# Patient Record
Sex: Female | Born: 1955
Health system: Southern US, Community
[De-identification: ages and names within clinical notes are randomized; demographics above are authoritative.]

## PROBLEM LIST (undated history)

## (undated) DIAGNOSIS — F329 Major depressive disorder, single episode, unspecified: Secondary | ICD-10-CM

## (undated) DIAGNOSIS — M1990 Unspecified osteoarthritis, unspecified site: Secondary | ICD-10-CM

## (undated) DIAGNOSIS — F32A Depression, unspecified: Secondary | ICD-10-CM

## (undated) DIAGNOSIS — M797 Fibromyalgia: Secondary | ICD-10-CM

## (undated) DIAGNOSIS — I82409 Acute embolism and thrombosis of unspecified deep veins of unspecified lower extremity: Secondary | ICD-10-CM

## (undated) DIAGNOSIS — I4891 Unspecified atrial fibrillation: Secondary | ICD-10-CM

## (undated) DIAGNOSIS — F419 Anxiety disorder, unspecified: Secondary | ICD-10-CM

## (undated) DIAGNOSIS — G4730 Sleep apnea, unspecified: Secondary | ICD-10-CM

## (undated) DIAGNOSIS — E669 Obesity, unspecified: Secondary | ICD-10-CM

## (undated) DIAGNOSIS — R001 Bradycardia, unspecified: Secondary | ICD-10-CM

## (undated) HISTORY — DX: Bradycardia, unspecified: R00.1

## (undated) HISTORY — DX: Sleep apnea, unspecified: G47.30

## (undated) HISTORY — PX: HERNIA REPAIR: SHX51

## (undated) HISTORY — PX: KNEE SURGERY: SHX244

## (undated) HISTORY — DX: Unspecified osteoarthritis, unspecified site: M19.90

## (undated) HISTORY — DX: Fibromyalgia: M79.7

## (undated) HISTORY — DX: Major depressive disorder, single episode, unspecified: F32.9

## (undated) HISTORY — DX: Unspecified atrial fibrillation: I48.91

## (undated) HISTORY — DX: Obesity, unspecified: E66.9

## (undated) HISTORY — DX: Depression, unspecified: F32.A

## (undated) HISTORY — PX: OTHER SURGICAL HISTORY: SHX169

## (undated) HISTORY — DX: Anxiety disorder, unspecified: F41.9

## (undated) HISTORY — PX: CHOLECYSTECTOMY: SHX55

## (undated) HISTORY — DX: Acute embolism and thrombosis of unspecified deep veins of unspecified lower extremity: I82.409

---

## 1998-03-04 ENCOUNTER — Ambulatory Visit (HOSPITAL_COMMUNITY): Admission: RE | Admit: 1998-03-04 | Discharge: 1998-03-04 | Payer: Self-pay | Admitting: Specialist

## 1998-03-04 ENCOUNTER — Encounter: Payer: Self-pay | Admitting: Specialist

## 1999-11-11 ENCOUNTER — Other Ambulatory Visit: Admission: RE | Admit: 1999-11-11 | Discharge: 1999-11-11 | Payer: Self-pay | Admitting: Gynecology

## 2000-01-23 ENCOUNTER — Encounter (INDEPENDENT_AMBULATORY_CARE_PROVIDER_SITE_OTHER): Payer: Self-pay | Admitting: Specialist

## 2000-01-23 ENCOUNTER — Other Ambulatory Visit: Admission: RE | Admit: 2000-01-23 | Discharge: 2000-01-23 | Payer: Self-pay | Admitting: Gynecology

## 2000-04-18 ENCOUNTER — Ambulatory Visit: Admission: RE | Admit: 2000-04-18 | Discharge: 2000-04-18 | Payer: Self-pay | Admitting: Specialist

## 2000-08-15 ENCOUNTER — Encounter (INDEPENDENT_AMBULATORY_CARE_PROVIDER_SITE_OTHER): Payer: Self-pay | Admitting: Specialist

## 2000-08-15 ENCOUNTER — Ambulatory Visit (HOSPITAL_COMMUNITY): Admission: RE | Admit: 2000-08-15 | Discharge: 2000-08-15 | Payer: Self-pay | Admitting: Gastroenterology

## 2000-09-18 ENCOUNTER — Other Ambulatory Visit: Admission: RE | Admit: 2000-09-18 | Discharge: 2000-09-18 | Payer: Self-pay | Admitting: Gynecology

## 2000-12-26 ENCOUNTER — Encounter (INDEPENDENT_AMBULATORY_CARE_PROVIDER_SITE_OTHER): Payer: Self-pay

## 2000-12-26 ENCOUNTER — Other Ambulatory Visit: Admission: RE | Admit: 2000-12-26 | Discharge: 2000-12-26 | Payer: Self-pay | Admitting: Gynecology

## 2002-07-04 ENCOUNTER — Other Ambulatory Visit: Admission: RE | Admit: 2002-07-04 | Discharge: 2002-07-04 | Payer: Self-pay | Admitting: Gynecology

## 2002-08-16 ENCOUNTER — Ambulatory Visit (HOSPITAL_BASED_OUTPATIENT_CLINIC_OR_DEPARTMENT_OTHER): Admission: RE | Admit: 2002-08-16 | Discharge: 2002-08-16 | Payer: Self-pay | Admitting: Otolaryngology

## 2005-02-24 ENCOUNTER — Other Ambulatory Visit: Admission: RE | Admit: 2005-02-24 | Discharge: 2005-02-24 | Payer: Self-pay | Admitting: Gynecology

## 2006-01-11 ENCOUNTER — Ambulatory Visit (HOSPITAL_COMMUNITY): Admission: RE | Admit: 2006-01-11 | Discharge: 2006-01-12 | Payer: Self-pay | Admitting: Orthopedic Surgery

## 2010-10-28 NOTE — Procedures (Signed)
[facility]  Patient:    Angela Cummings, Angela Cummings                      MRN: [id number] Proc. Date: 08/15/00 Attending:  Florencia Reasons, M.D. CC:         Aura Dials, M.D., [facility]   Procedure Report  PROCEDURE:  Upper endoscopy with biopsy.  ENDOSCOPIST:  Florencia Reasons, M.D.  INDICATIONS:  A 55 year old with iron-deficiency anemia, 12 years status post some sort of gastric surgery for obesity.  FINDINGS:  Small gastric remnant anastomosed to small intestine.  DESCRIPTION OF PROCEDURE:  The nature, purpose, and risks of the procedure have been reviewed with the patient who provided written consent.  Sedation was fentanyl 87.5 mcg and Versed 7 mg IV without arrhythmias or desaturation.  The Olympus adult video endoscope was passed under direct vision, entering the esophagus without difficulty.  The vocal cords were not well seen.  The esophagus was endoscopically normal without evidence of reflux esophagitis, Barretts esophagus, varices, infection, or neoplasia.  No ring, stricture, or hiatal hernia was appreciated.  The gastric remnant was fairly small, probably about 5 to 10 cm in length, and at its distal end was a nice, widely patent anastomosis to the small bowel in a Billroth I sort of configuration.  I was able to advance the scope a long distance down the small bowel without difficulty, and it had a normal endoscopic appearance.  Several small-bowel biopsies were obtained to help rule out sprue as the source of the iron-deficiency anemia, and the scope was then removed from the patient who tolerated the procedure well and without apparent complication.  Retroflexion was not performed in the gastric pouch.  IMPRESSION:  Normal exam status post gastric surgery.  PLAN:  Await pathology on small-bowel biopsies. DD:  08/15/00 TD:  08/15/00 Job: [id number]

## 2010-10-28 NOTE — Op Note (Signed)
NAME:  Angela Cummings, Angela Cummings             ACCOUNT NO.:  [id number]   MEDICAL RECORD NO.:  [id number]          PATIENT TYPE:  AMB   LOCATION:  DAY                          FACILITY:  [facility]   PHYSICIAN:  Marlowe Kays, M.D.  DATE OF BIRTH:  11/03/1955   DATE OF PROCEDURE:  01/11/2006  DATE OF DISCHARGE:                                 OPERATIVE REPORT   PREOPERATIVE DIAGNOSES:  1.  Adhesive capsulitis.  2.  Chronic impingement syndrome with partial rotator cuff tear, left      shoulder.   POSTOPERATIVE DIAGNOSES:  1.  Adhesive capsulitis.  2.  Chronic impingement syndrome with partial rotator cuff tear, left      shoulder.   OPERATIONS:  1.  Closed manipulation, left shoulder.  2.  Left shoulder arthroscopy (normal examination and arthroscopic      subacromial and distal inferior clavicle decompression.   SURGEON:  Dr. Simonne Come.   ASSISTANT:  Mr. Idolina Primer, [place].   ANESTHESIA:  General.   PATHOLOGY AND JUSTIFICATION FOR PROCEDURE:  Problem stem from a fall in  April, and she injured her foot and her left shoulder.  Because of  persistent pain and loss of motion, had a MRI performed on the left  shoulder, on November 12, 2005, which demonstrated rotator cuff tendinopathy with  no full-thickness tear.  It was felt that, because of the combination of  fairly restricted range of motion and the rotator cuff tendinopathy, that  the above-mentioned surgery was indicated.   PROCEDURE:  Satisfactory general anesthesia, beach-chair position on the  Murphy frame.  I first gently manipulated her left shoulder, with abduction,  extension, external rotation and internal rotation and repeating the cycle  several times.  Audible and palpable lysis of adhesions could be felt,  particularly the abduction and extension, with full restoration of normal  motion at the conclusion of this procedure.  We then prepped her left  shoulder and arm in the usual fashion and draped her in sterile field.  The  anatomy of the shoulder joint was marked out, and I injected the subacromial  space, and lateral and posterior portals with 0.5% Marcaine with adrenaline.  Through a posterior soft spot portal, I atraumatically entered the humeral  joint.  There was some minor bleeding which presumably was as a result of  the closed manipulation, but no abnormality was noted, and some  representative pictures were taken.  I then redirected the scope in the  subacromial space and, through a lateral portal, introduced a blunt cannula,  followed by a 4.2 shaver.  She had fairly exuberant subdeltoid bursitis  which I cleared up with the 4.2 shaver.  I documented the several areas  impingement, including anterior leading edge of the acromion and the distal  clavicle.  I then used the ArthroCare vaporizer to begin removing bone from  the under the surface of these areas, and followed this with the 4-mm oval  bur, and then went back and forth between the three instruments until we had  a wide decompression involving the anterior acromion and the distal  clavicle.  This was documented with  pictures with her arm to her side and  the arm abducted.  All bleeding was coagulated.  At the conclusion of the  case, she was given 30 mg of Toradol IV.  All fluid possible was removed.  I  reinjected the two portals and the subacromial space with 0.5% Marcaine with  adrenalin and then closed the two portals with 4-0 nylon, followed by  Betadine, Adaptic, dry sterile dressing and shoulder immobilizer.  She  tolerated the procedure and was taken to the recovery room in satisfactory  condition with no complication.           ______________________________  Marlowe Kays, M.D.     JA/MEDQ  D:  01/11/2006  T:  01/11/2006  Job:  [id number]

## 2010-10-28 NOTE — Procedures (Signed)
[facility]  Patient:    FLOREINE, KINGDON                      MRN: [id number] Proc. Date: 08/15/00 Attending:  Florencia Reasons, M.D. CC:         Aura Dials, M.D.                           Procedure Report  PROCEDURE:  Colonoscopy with biopsies.  INDICATIONS:  55 year old with unexplained iron deficiency anemia.  INTRAOPERATIVE FINDINGS:  Grossly normal exam to the cecal region. Questionable small cecal polyp, not biopsied.  ANESTHESIA:  Fentanyl 100 mcg and Versed 10 mg IV.  DESCRIPTION OF PROCEDURE:  The nature, purpose and risks of the procedure have been discussed with the patient who provided written consent. This procedure was performed immediately following an upper endoscopy with total sedation for the two procedures being fentanyl 100 mcg and Versed 10 mg IV without arrhythmias or desaturation.  The Olympus adult video colonoscope was advanced to the level just above the ileocecal valve, but despite abdominal compression and having the patient in the left lateral supine, right lateral and prone positions, we were unable to reach the base of the cecum, despite insertion of virtually the entire scope and trials of external abdominal compression. However, I was able to look down into the cecum and see virtually the entire cecal surface area and did not see anything abnormal other than a question of a small sessile polyp which I attempted to biopsy but I could not get the biopsy forceps oriented properly to reach the lesion. Thus, this possible small 3-4 mm polyp was never biopsied. Pullback was then performed. The quality of the prep was very good and it was felt that essentially all areas were well seen. No additional polyps were identified and there was no evidence of cancer, colitis, vascular malformations or diverticular disease. In retroflexion, the rectum was unremarkable. Random mucosal biopsies were obtained along the  length of the colon because of question of some diarrhea in this patient based on talking with her at the start of the procedure, although, she did not complain of diarrhea when I saw her in the office to set up the procedure. The patient tolerated the procedure well and there were no apparent complications.  IMPRESSION:  Grossly normal exam to the cecal region with questionable small cecal polyp, not biopsied. See above discussion. No source of iron deficiency anemia endoscopically evident.  PLAN:  Await pathology on random mucosal biopsies. DD:  08/15/00 TD:  08/15/00 Job: [id number]

## 2010-11-11 DIAGNOSIS — I4891 Unspecified atrial fibrillation: Secondary | ICD-10-CM

## 2010-11-11 HISTORY — DX: Unspecified atrial fibrillation: I48.91

## 2010-11-12 ENCOUNTER — Emergency Department (HOSPITAL_COMMUNITY): Payer: 59

## 2010-11-12 ENCOUNTER — Observation Stay (HOSPITAL_COMMUNITY)
Admission: EM | Admit: 2010-11-12 | Discharge: 2010-11-13 | Disposition: A | Discharge status: Home / Self Care | Payer: 59 | Attending: Family Medicine | Admitting: Family Medicine

## 2010-11-12 DIAGNOSIS — R000 Tachycardia, unspecified: Secondary | ICD-10-CM

## 2010-11-12 DIAGNOSIS — R079 Chest pain, unspecified: Secondary | ICD-10-CM | POA: Insufficient documentation

## 2010-11-12 DIAGNOSIS — I4891 Unspecified atrial fibrillation: Principal | ICD-10-CM | POA: Insufficient documentation

## 2010-11-12 DIAGNOSIS — F411 Generalized anxiety disorder: Secondary | ICD-10-CM | POA: Insufficient documentation

## 2010-11-12 DIAGNOSIS — R42 Dizziness and giddiness: Secondary | ICD-10-CM | POA: Insufficient documentation

## 2010-11-12 DIAGNOSIS — R61 Generalized hyperhidrosis: Secondary | ICD-10-CM | POA: Insufficient documentation

## 2010-11-12 DIAGNOSIS — M1990 Unspecified osteoarthritis, unspecified site: Secondary | ICD-10-CM | POA: Insufficient documentation

## 2010-11-12 DIAGNOSIS — M479 Spondylosis, unspecified: Secondary | ICD-10-CM

## 2010-11-12 LAB — CBC
HCT: 36.6 % (ref 36.0–46.0)
Hemoglobin: 11.8 g/dL — ABNORMAL LOW (ref 12.0–15.0)
MCH: 28.4 pg (ref 26.0–34.0)
MCHC: 32.2 g/dL (ref 30.0–36.0)
MCV: 88.2 fL (ref 78.0–100.0)
Platelets: 227 10*3/uL (ref 150–400)
RBC: 4.15 MIL/uL (ref 3.87–5.11)
RDW: 14.8 % (ref 11.5–15.5)
WBC: 7.8 10*3/uL (ref 4.0–10.5)

## 2010-11-13 LAB — CARDIAC PANEL(CRET KIN+CKTOT+MB+TROPI)
Relative Index: INVALID (ref 0.0–2.5)
Relative Index: INVALID (ref 0.0–2.5)
Total CK: 96 U/L (ref 7–177)
Troponin I: <0.3 ng/mL (ref <0.30)

## 2010-11-13 LAB — COMPREHENSIVE METABOLIC PANEL
Albumin: 3.2 g/dL — ABNORMAL LOW (ref 3.5–5.2)
BUN: 10 mg/dL (ref 6–23)
Chloride: 108 mEq/L (ref 96–112)
Creatinine, Ser: 0.78 mg/dL (ref 0.4–1.2)
Glucose, Bld: 111 mg/dL — ABNORMAL HIGH (ref 70–99)
Total Bilirubin: 0.3 mg/dL (ref 0.3–1.2)
Total Protein: 6.5 g/dL (ref 6.0–8.3)

## 2010-11-13 LAB — CK TOTAL AND CKMB (NOT AT ARMC)
Relative Index: 2.6 — ABNORMAL HIGH (ref 0.0–2.5)
Total CK: 113 U/L (ref 7–177)

## 2010-11-13 LAB — GLUCOSE, CAPILLARY: Glucose-Capillary: 87 mg/dL (ref 70–99)

## 2010-11-13 LAB — TROPONIN I: Troponin I: <0.3 ng/mL (ref <0.30)

## 2010-11-22 NOTE — H&P (Signed)
NAME:  Angela Cummings, Angela Cummings NO.:  [id number]  MEDICAL RECORD NO.:  [id number]           PATIENT TYPE:  O  LOCATION:  2008                         FACILITY:  [facility]  PHYSICIAN:  Houston Siren, MD           DATE OF BIRTH:  1955/08/07  DATE OF ADMISSION:  11/12/2010 DATE OF DISCHARGE:                             HISTORY & PHYSICAL   PRIMARY CARE PHYSICIAN:  None.  REASON FOR ADMISSION:  Atrial fibrillation with rapid ventricular rate and chest pain.  ADVANCE DIRECTIVES:  Full code.  HISTORY OF PRESENT ILLNESS:  This is a 55 year old female with benign past medical history on no medication, but perhaps by virtue of never seeing a physician, presented by EMS with chest pain.  She was found to be in atrial fibrillation with rapid ventricular rate.  She converted spontaneously in the emergency room, and her chest pain resolved completely.  She denied excessive alcohol use and no recent over-the- counter decongestants.  She has intermittently felt this way in the past described as chest tightness.  Evaluation shows EMS strip with atrial fibrillation at a rate approximately 200 bpm, now her heart rate is around 50.  Her chest x-ray showed cardiomegaly, but otherwise unremarkable, and her potassium is 3.8.  She has a normal white count of 7800, hemoglobin of 11.8.  It was noted that she was given Lopressor 25 mg and subsequently, her heart rate is between 45 and 50, asymptomatic. Hospitalist was asked to admit the patient because of these symptoms.  PAST MEDICAL HISTORY:  Benign.  MEDICATIONS:  On no chronic meds.  ALLERGIES:  No known drug allergies.  SOCIAL HISTORY:  She is married with three children.  She lives in a residential home .  Denies tobacco or drug use.  She drinks alcohol rarely.  FAMILY HISTORY:  No family history of diabetes or thyroid problems.  REVIEW OF SYSTEMS:  Otherwise, unremarkable.  PHYSICAL EXAMINATION:  VITAL SIGNS:  Blood pressure 106/66,  pulse of 50, respiratory rate of 18, temperature 98.1. GENERAL:  She is alert, oriented and is in no apparent distress.  Her speech is fluent.  Tongue is midline.  Uvula elevated with phonation. NECK:  Supple.  There is no thyromegaly.  No thyroid bruit.  She has no JVD. EYES:  Sclera nonicteric. CARDIAC:  S1-S2 regular.  I did not hear any murmur, rub or gallop. LUNGS:  Clear.  No wheezes, rales or any evidence of consolidation. ABDOMEN:  Obese, nondistended, nontender. EXTREMITIES:  No edema.  No calf tenderness. SKIN:  Warm and dry. NEUROLOGIC:  Unremarkable. PSYCHIATRIC:  Unremarkable.  OBJECTIVE FINDINGS:  EKG prior atrial fibrillation with rapid ventricular rate.  Last EKG showed normal sinus rhythm without any acute ST-T changes.  Serum sodium 142, potassium 3.8, glucose 111.  Chest x- ray showed cardiomegaly but without any acute process.  Liver function tests are normal.  Troponin less than 0.30.  White count is 7800, hemoglobin 11.8, platelet count of 227,000.  IMPRESSION:  This is a 55 year old with paroxysmal atrial fibrillation CHADS score of 0.  (Though it should be noted that she does  not get regular medical care) now in normal sinus rhythm.  She has no excessive alcohol use.  We will admit.  We will check echo and thyroid function test.  I noted that she was given one dose of Lopressor, and now her heart rate is in the high 40s, and we will hold further beta blocker for negative chronotrope.  We will give her an aspirin and rule out with serial CPKs and troponins.  There is no evidence of pulmonary embolism. We will admit her to [facility].  She is stable and is a full code.     Houston Siren, MD     PL/MEDQ  D:  11/13/2010  T:  11/13/2010  Job:  [id number]  Electronically Signed by Houston Siren  on 11/22/2010 02:06:51 AM

## 2010-11-25 ENCOUNTER — Encounter: Payer: Self-pay | Admitting: Internal Medicine

## 2010-11-28 ENCOUNTER — Encounter: Payer: Self-pay | Admitting: Internal Medicine

## 2010-11-28 ENCOUNTER — Ambulatory Visit (INDEPENDENT_AMBULATORY_CARE_PROVIDER_SITE_OTHER): Payer: 59 | Admitting: Internal Medicine

## 2010-11-28 DIAGNOSIS — I498 Other specified cardiac arrhythmias: Secondary | ICD-10-CM

## 2010-11-28 DIAGNOSIS — R001 Bradycardia, unspecified: Secondary | ICD-10-CM

## 2010-11-28 DIAGNOSIS — I4891 Unspecified atrial fibrillation: Secondary | ICD-10-CM

## 2010-11-28 DIAGNOSIS — I495 Sick sinus syndrome: Secondary | ICD-10-CM

## 2010-11-28 DIAGNOSIS — E669 Obesity, unspecified: Secondary | ICD-10-CM

## 2010-11-28 DIAGNOSIS — I480 Paroxysmal atrial fibrillation: Secondary | ICD-10-CM | POA: Insufficient documentation

## 2010-11-28 MED ORDER — NADOLOL 20 MG PO TABS: ORAL_TABLET | ORAL | Status: DC

## 2010-11-28 MED ORDER — FLECAINIDE ACETATE 100 MG PO TABS: 100.0000 mg | ORAL_TABLET | Freq: Two times a day (BID) | ORAL | Status: DC

## 2010-11-28 NOTE — Assessment & Plan Note (Signed)
The patient has symptomatic paroxysmal atrial fibrillation.  She has not previously tried antiarrhythmic medication.  Her CHADSVASC score is 1.  She is appropriately anticoagulated with ASA. Therapeutic strategies for afib including medicine and ablation were discussed in detail with the patient today.  The importance of lifestyle modification including weight loss were discussed at length.  Presently, her size would put her at increased risks with ablation with reduced anticipated success rates.  I have therefore recommended medical therapy for her afib. As her recent stress test was normal, we will start flecainide 100mg  BID at this time.  I have also added nadolol 10mg  daily for rate control. She will return in 4-6 weeks for further assessment.

## 2010-11-28 NOTE — Progress Notes (Signed)
Angela Cummings is a pleasant 55 y.o. yo patient with a h/o paroxysmal atrial fibrillation who presents today for EP consultation.  She reports initially developing tachypalpitations 2/12.  She reports associated anxiousness, SOB, and chest pain.  She also reports having "pressure" in her head.  Episodes occur several times per day and are worsened with stress related to her children.  Episodes may last several hours at a time. She was placed on metoprolol but could not tolerate this medicine due to bradycardia (HR 40s).  She denies symptoms of bradycardia with this however. She has not previously tried AAD therapy.  Her CHADSVASC score is 1 and she has been treated with ASA for stroke prevention. She was evaluated by Dr Jacinto Halim and had an echo which revealed LA size 49mm with a preserved EF.  She has also had a normal stress test in his office. Today, she denies symptoms of orthopnea, PND, lower extremity edema, dizziness, presyncope, syncope, or neurologic sequela. The patient is tolerating medications without difficulties and is otherwise without complaint today.   Past Medical History  Diagnosis Date  . Atrial fibrillation     paroxysmal  . Bradycardia   . DJD (degenerative joint disease)   . Obesity   . Asthma    Past Surgical History  Procedure Date  . Cholecystectomy   . Cesarean section   . Knee surgery   . Neck tumor resection     benign    Current Outpatient Prescriptions  Medication Sig Dispense Refill  . aspirin 81 MG tablet Take 81 mg by mouth daily.        Marland Kitchen DISCONTD: acetaminophen (TYLENOL) 650 MG CR tablet Take 650 mg by mouth 3 (three) times daily.        Marland Kitchen DISCONTD: metoprolol succinate (TOPROL-XL) 12.5 mg TB24 Take 6.25 mg by mouth 2 (two) times daily.          Not on File  History   Social History  . Marital Status: Married    Spouse Name: N/A    Number of Children: N/A  . Years of Education: N/A   Occupational History  . Not on file.   Social History  Main Topics  . Smoking status: Never Smoker   . Smokeless tobacco: Not on file  . Alcohol Use: Yes     Rarely  . Drug Use: No  . Sexually Active: Not on file   Other Topics Concern  . Not on file   Social History Narrative   Married with 3 children Lives in [place].  Unemployed.    Family History  Problem Relation Age of Onset  . Diabetes Other   . Thyroid disease Other     ROS- All systems are reviewed and negative except as per the HPI above  Physical Exam: Filed Vitals:   11/28/10 0841  BP: 140/76  Pulse: 62  Height: 5\' 1"  (1.549 m)  Weight: 227 lb (102.967 kg)    GEN- The patient is overweight, alert and oriented x 3 today.   Head- normocephalic, atraumatic Eyes-  Sclera clear, conjunctiva pink Ears- hearing intact Oropharynx- clear Neck- supple, no JVP Lymph- no cervical lymphadenopathy Lungs- Clear to ausculation bilaterally, normal work of breathing Heart- Regular rate and rhythm, no murmurs, rubs or gallops, PMI not laterally displaced GI- soft, NT, ND, + BS Extremities- no clubbing, cyanosis, or edema MS- no significant deformity or atrophy Skin- no rash or lesion Psych- euthymic mood, full affect Neuro- strength and sensation are intact  EKG today reveals sinus rhythm with frequent PACs, incomplete RBBB, otherwise normal ekg  Assessment and Plan:

## 2010-11-28 NOTE — Discharge Summary (Signed)
NAME:  Angela Cummings, Angela Cummings             ACCOUNT NO.:  [id number]  MEDICAL RECORD NO.:  [id number]           PATIENT TYPE:  O  LOCATION:  2008                         FACILITY:  [facility]  PHYSICIAN:  Leighton Roach McDiarmid, M.D.DATE OF BIRTH:  [birth year]  DATE OF ADMISSION:  11/12/2010 DATE OF DISCHARGE:  11/13/2010                              DISCHARGE SUMMARY   PRIMARY CARE PHYSICIAN:  [facility].  DISCHARGE DIAGNOSES: 1. Atrial fibrillation with rapid ventricular rate, now normal sinus     rhythm. 2. Osteoarthritis. 3. History of anxiety. 4. Chest pain, resolved.  DISCHARGE MEDICATIONS: 1. Aspirin 81 mg p.o. daily. 2. Metoprolol 6.25 mg p.o. b.i.d. 3. Tylenol 650 mg p.o. t.i.d.  CONSULTS:  None.  PROCEDURE:  Echocardiogram, read is pending.  LABORATORY DATA:  At the time of admission the patient's CBC was unremarkable with the exception of a hemoglobin of 11.8.  CMP was unremarkable with the exception of slightly elevated alk phos of 183 and low albumin at 3.2.  Cardiac enzymes were cycled and were negative x3. TSH was within normal limits at 3.090.  BRIEF HOSPITAL COURSE:  This is a 55 year old female with relatively benign past medical history, presenting via the EMS to the emergency department with chest pain and AFib with RPR. 1. AFib with RPR.  At the time, that EMS arrived at the patient's home     her heart rate was in the 210, upon arriving to the emergency     department the patient's chest pain had resolved spontaneously and     heart rate had decreased to the 100.  While in the emergency     department, the patient was given metoprolol 25 mg p.o. x1 which     decreased her heart rate into the 40s.  The patient was admitted     and observed.  She spontaneously converted into normal sinus     rhythm.  On the day of discharge, the patient's heart rate had     improved to the upper 50s to low 60s.  The patient will be sent out     on a very small dose of  metoprolol 6.25 mg p.o. b.i.d., this can be     titrated as needed by primary care physician.  In addition, the     patient was started on stroke prophylaxis with aspirin 81 mg p.o.     daily.  The patient's blood pressures were in the 110s/60s, so ACE     inhibitor was not started, however, this could also be considered     by the primary care physician as could obtain a fasting lipid panel     and starting a statin all for stroke prophylaxis.  At the time of     admission, the patient's CHADS score was 0.  Echocardiogram was     done and was pending at the time of discharge.  PCP should also     follow up on this.  The patient reports no recent changes in     medications, no additions or elimination of herbal or holistic     medications and has not  recently moved or started any new activity     which could have been precipitating factors for this AFib.  Cardiac     enzymes were cycled x3 and were negative.  It appears that the     patient may have been spontaneously converting into an out of AFib     since February which is when her chest pain first started. 2. Osteoarthritis.  The patient reports a history of osteoarthritis     for which she was taking Celexa.  The patient was told to stop her     Celexa few weeks ago at her PCP's office.  She did do this, it was     suggested to her to start Tylenol 650 mg p.o. t.i.d. for     osteoarthritis treatment.  DISCHARGE INSTRUCTIONS:  The patient was instructed that she had no restrictions on activity but to stop any activity that could cause chest pain or shortness of breath and to return to [facility] or the emergency department if she experienced this chest pain, felt like her heart was fluttering out of her chest, became dizzy, diaphoretic.  FOLLOWUP:  The patient is to follow up at [facility] this coming week to have her heart rate reassessed and to have PCP decide whether or not should be a good candidate for an ACE  inhibitor and/or statin.  DISCHARGE CONDITION:  The patient was discharged home in stable medical condition with a low normal heart rate.    ______________________________ Angela Pore, MD   ______________________________ Leighton Roach McDiarmid, M.D.    JM/MEDQ  D:  11/13/2010  T:  11/14/2010  Job:  [id number]  cc:   [facility]  Electronically Signed by Angela Pore MD on 11/21/2010 11:38:22 AM Electronically Signed by Acquanetta Belling M.D. on 11/28/2010 12:38:02 PM

## 2010-11-28 NOTE — Patient Instructions (Signed)
Your physician recommends that you schedule a follow-up appointment in: 4 weeks with Dr Johney Frame  Your physician has recommended you make the following change in your medication: start Nadolol 10mg  daily and start Flecainide 100mg  twice daily

## 2010-11-28 NOTE — Assessment & Plan Note (Signed)
Her morbid obesity certainly contributes to her afib and makes therapy more challenging.  She appears to have poor incite into this however.  She has no interest in lifestyle modification at this time.

## 2010-11-28 NOTE — Assessment & Plan Note (Signed)
Asymptomatic She is quite clear that she wishes to avoid pacemaker at this time. I will start low dose Nadolol 10mg  daily for rate control and we will follow for symptomatic bradycardia.

## 2011-01-04 ENCOUNTER — Ambulatory Visit: Payer: 59 | Admitting: Internal Medicine

## 2011-01-11 ENCOUNTER — Encounter: Payer: Self-pay | Admitting: Internal Medicine

## 2011-01-11 ENCOUNTER — Ambulatory Visit (INDEPENDENT_AMBULATORY_CARE_PROVIDER_SITE_OTHER): Payer: 59 | Admitting: Internal Medicine

## 2011-01-11 DIAGNOSIS — E669 Obesity, unspecified: Secondary | ICD-10-CM

## 2011-01-11 DIAGNOSIS — I4891 Unspecified atrial fibrillation: Secondary | ICD-10-CM

## 2011-01-11 DIAGNOSIS — R001 Bradycardia, unspecified: Secondary | ICD-10-CM

## 2011-01-11 DIAGNOSIS — I498 Other specified cardiac arrhythmias: Secondary | ICD-10-CM

## 2011-01-11 NOTE — Progress Notes (Signed)
Angela Cummings is a pleasant 55 y.o. yo patient with a h/o paroxysmal atrial fibrillation who presents today for EP follow-up. She feels that her afib is much improved since starting flecainide.  Her primary concern is with fatigue.  She feels that this preceeded initiation of flecainide or nadolol.  She does not feel well rested upon waking in the morning.  She reports occasional chest tightness but denies SOB. She was evaluated by Dr Jacinto Halim and had an echo which revealed LA size 49mm with a preserved EF.  She has also had a normal stress test in his office. Today, she denies symptoms of orthopnea, PND, lower extremity edema, dizziness, presyncope, syncope, or neurologic sequela. The patient is tolerating medications without difficulties and is otherwise without complaint today.   Past Medical History  Diagnosis Date  . Atrial fibrillation     paroxysmal  . Bradycardia   . DJD (degenerative joint disease)   . Obesity   . Asthma    Past Surgical History  Procedure Date  . Cholecystectomy   . Cesarean section   . Knee surgery   . Neck tumor resection     benign    Current Outpatient Prescriptions  Medication Sig Dispense Refill  . aspirin 81 MG tablet Take 81 mg by mouth daily.        . celecoxib (CELEBREX) 200 MG capsule Take 200 mg by mouth daily.        . flecainide (TAMBOCOR) 100 MG tablet Take 1 tablet (100 mg total) by mouth 2 (two) times daily.  60 tablet  6  . nadolol (CORGARD) 20 MG tablet 1/2 tablet daily  30 tablet  6    No Known Allergies  History   Social History  . Marital Status: Married    Spouse Name: N/A    Number of Children: N/A  . Years of Education: N/A   Occupational History  . Not on file.   Social History Main Topics  . Smoking status: Never Smoker   . Smokeless tobacco: Not on file  . Alcohol Use: Yes     Rarely  . Drug Use: No  . Sexually Active: Not on file   Other Topics Concern  . Not on file   Social History Narrative   Married  with 3 children Lives in [place].  Unemployed.    Family History  Problem Relation Age of Onset  . Diabetes Other   . Thyroid disease Other     ROS- All systems are reviewed and negative except as per the HPI above  Physical Exam: Filed Vitals:   01/11/11 0928  BP: 138/70  Pulse: 47  Resp: 14  Height: 5\' 1"  (1.549 m)  Weight: 228 lb (103.42 kg)    GEN- The patient is overweight, alert and oriented x 3 today.   Head- normocephalic, atraumatic Eyes-  Sclera clear, conjunctiva pink Ears- hearing intact Oropharynx- clear Neck- supple, no JVP Lymph- no cervical lymphadenopathy Lungs- Clear to ausculation bilaterally, normal work of breathing Heart- Regular rate and rhythm, no murmurs, rubs or gallops, PMI not laterally displaced GI- soft, NT, ND, + BS Extremities- no clubbing, cyanosis, or edema MS- no significant deformity or atrophy Skin- no rash or lesion Psych- euthymic mood, full affect Neuro- strength and sensation are intact  EKG today reveals sinus bradycardia 47 bpm, RBBB, otherwise normal ekg  Assessment and Plan:

## 2011-01-11 NOTE — Patient Instructions (Signed)
Your physician has requested that you have an exercise tolerance test. For further information please visit [email or web address] Please also follow instruction sheet, as given.  In 4weeks with Dr Johney Frame  Your physician has recommended that you have a sleep study. This test records several body functions during sleep, including: brain activity, eye movement, oxygen and carbon dioxide blood levels, heart rate and rhythm, breathing rate and rhythm, the flow of air through your mouth and nose, snoring, body muscle movements, and chest and belly movement.

## 2011-01-11 NOTE — Assessment & Plan Note (Signed)
Weight loss advised 

## 2011-01-11 NOTE — Assessment & Plan Note (Signed)
Well controlled with flecainide Continue ASA Continue nadolol for rate control.

## 2011-01-11 NOTE — Assessment & Plan Note (Signed)
She has fatigue of unclear etiology.  I suspect that sleep apnea may be an issue.  I will therefore order a sleep study.  If she has sleep apnea, then we will treat this.  I will also obtain a GXT to assess her heart rate response to exercise. If her fatigue does not improve post sleep study/ treatment and she has a blunted HR, we may have to consider pacing longterm.

## 2011-01-12 ENCOUNTER — Ambulatory Visit: Payer: 59 | Admitting: Internal Medicine

## 2011-01-29 ENCOUNTER — Ambulatory Visit (HOSPITAL_BASED_OUTPATIENT_CLINIC_OR_DEPARTMENT_OTHER): Payer: 59 | Attending: Internal Medicine

## 2011-01-29 DIAGNOSIS — G4733 Obstructive sleep apnea (adult) (pediatric): Secondary | ICD-10-CM | POA: Insufficient documentation

## 2011-01-29 DIAGNOSIS — R001 Bradycardia, unspecified: Secondary | ICD-10-CM

## 2011-01-29 DIAGNOSIS — I4891 Unspecified atrial fibrillation: Secondary | ICD-10-CM

## 2011-01-29 DIAGNOSIS — E669 Obesity, unspecified: Secondary | ICD-10-CM

## 2011-02-02 DIAGNOSIS — G4733 Obstructive sleep apnea (adult) (pediatric): Secondary | ICD-10-CM

## 2011-02-02 NOTE — Procedures (Signed)
NAME:  Angela Cummings, Angela Cummings NO.:  [id number]  MEDICAL RECORD NO.:  [id number]          PATIENT TYPE:  OUT  LOCATION:  SLEEP CENTER                 FACILITY:  [facility]  PHYSICIAN:  Barbaraann Share, MD,FCCPDATE OF BIRTH:  Nov 13[birth year] 1953  DATE OF STUDY:  01/29/2011                           NOCTURNAL POLYSOMNOGRAM  REFERRING PHYSICIAN:  Hillis Range, MD  INDICATION FOR STUDY:  Hypersomnia with sleep apnea.  EPWORTH SLEEPINESS SCORE:  10.  SLEEP ARCHITECTURE:  The patient had total sleep time of 333 minutes with decreased slow wave sleep and borderline quantity of REM.  Sleep onset latency was normal at 25 minutes, and REM onset was normal at 53 minutes.  Sleep efficiency was mildly reduced at 85%.  RESPIRATORY DATA:  The patient was found to have 4 apneas and 93 obstructive hypopneas, giving her an apnea/hypopnea index of 18 events per hour.  The events occurred in all body positions, but were clearly worse during REM.  There was moderate snoring noted throughout.  The patient did not meet split night criteria secondary to the majority of her events occurring after 1:00 a.m.  OXYGEN DATA:  There was O2 desaturation as low as 75% with the patient's obstructive events.  CARDIAC DATA:  No clinically significant arrhythmias were noted.  MOVEMENT-PARASOMNIA:  The patient had no significant leg jerks or other abnormal behavior seen.  IMPRESSION-RECOMMENDATIONS:  Mild-to-moderate obstructive sleep apnea/hypopnea syndrome with an AH I of 18 events per hour and O2 desaturation as low as 75%.  The patient did not meet split night criteria secondary to the majority of her events occurring well after 1:00 a.m.  Treatment for this degree of sleep apnea can include a trial of weight loss alone, upper airway surgery, dental appliance, and also CPAP.  Clinical correlation is suggested.     Barbaraann Share, MD,FCCP Diplomate, American Board of Sleep Medicine Electronically  Signed   [facility]  D:  02/02/2011 13:19:35  T:  02/02/2011 19:19:18  Job:  [id number]

## 2011-02-22 ENCOUNTER — Ambulatory Visit (INDEPENDENT_AMBULATORY_CARE_PROVIDER_SITE_OTHER): Payer: 59 | Admitting: Internal Medicine

## 2011-02-22 DIAGNOSIS — R001 Bradycardia, unspecified: Secondary | ICD-10-CM

## 2011-02-22 DIAGNOSIS — I4891 Unspecified atrial fibrillation: Secondary | ICD-10-CM

## 2011-02-22 DIAGNOSIS — E669 Obesity, unspecified: Secondary | ICD-10-CM

## 2011-02-22 NOTE — Progress Notes (Signed)
Exercise Treadmill Test  Pre-Exercise Testing Evaluation Rhythm: sinus bradycardia , right bundle branch block Rate: 42   PR:  .17 QRS:  .11  QT:  50 QTc: 41     Test  Exercise Tolerance Test Ordering MD: Hillis Range, MD  Interpreting MD:  Hillis Range, MD  Unique Test No: 1  Treadmill:  1  Indication for ETT: A-FIB  Contraindication to ETT: No   Stress Modality: exercise - treadmill  Cardiac Imaging Performed: non   Protocol: standard Bruce - maximal  Max BP:  144/69  Max MPHR (bpm):  163 85% MPR (bpm):  138  MPHR obtained (bpm): 138 % MPHR obtained: 85  Reached 85% MPHR (min:sec)4:20 Total Exercise Time (min-sec):4:47  Workload in METS: 6.7 Borg Scale: 17  Reason ETT Terminated:  hip pain    ST Segment Analysis At Rest: normal ST segments - no evidence of significant ST depression With Exercise: no evidence of significant ST depression  Other Information Arrhythmia:  No Angina during ETT:  absent (0) Quality of ETT:  diagnostic  ETT Interpretation:  normal - no evidence of ischemia by ST analysis  Comments: Heart rate briskly rose from 40s to 60s with approaching the treadmill,  She achieved target heart rate.  She is chronotropically competent even on nadolol.  No indication for pacemaker at this time.  No exercise induced arrhythmias with flecainide.  Exercise limited today by deconditioning and hip pain.  Recommendations: Continue current medications Regular exercise and weight loss are essential Follow up with Dr Jacinto Halim as scheduled.

## 2011-03-03 ENCOUNTER — Telehealth: Payer: Self-pay | Admitting: *Deleted

## 2011-03-03 NOTE — Telephone Encounter (Signed)
Called patient to review sleep study results.  Per Dr Johney Frame, can refer to pulmonary if patient wishes.  He recommends weight loss.  Left message for patient to return call.

## 2011-03-03 NOTE — Telephone Encounter (Signed)
Message copied by Marily Lente on Fri Mar 03, 2011  9:54 AM ------      Message from: Hillis Range      Created: Mon Feb 13, 2011  4:33 PM       Results reviewed.  Please inform pt of result.

## 2011-03-07 NOTE — Telephone Encounter (Signed)
Wakes up several times during the night due to going to the restroom  She feels so tired and has a lot on her mind  She will try to get her life together first then let us know if she wants to pursue a referral to Dr  Shelle Iron

## 2011-03-07 NOTE — Telephone Encounter (Signed)
Pt rtn call from Friday you can reach her at [phone]

## 2011-06-26 ENCOUNTER — Ambulatory Visit: Payer: 59 | Admitting: Internal Medicine

## 2011-06-27 ENCOUNTER — Encounter: Payer: Self-pay | Admitting: Internal Medicine

## 2011-07-31 ENCOUNTER — Encounter: Payer: Self-pay | Admitting: Internal Medicine

## 2011-07-31 ENCOUNTER — Ambulatory Visit (INDEPENDENT_AMBULATORY_CARE_PROVIDER_SITE_OTHER): Payer: 59 | Admitting: Internal Medicine

## 2011-07-31 DIAGNOSIS — E669 Obesity, unspecified: Secondary | ICD-10-CM

## 2011-07-31 DIAGNOSIS — R001 Bradycardia, unspecified: Secondary | ICD-10-CM

## 2011-07-31 DIAGNOSIS — I498 Other specified cardiac arrhythmias: Secondary | ICD-10-CM

## 2011-07-31 DIAGNOSIS — I495 Sick sinus syndrome: Secondary | ICD-10-CM

## 2011-07-31 DIAGNOSIS — I4891 Unspecified atrial fibrillation: Secondary | ICD-10-CM

## 2011-07-31 MED ORDER — FLECAINIDE ACETATE 100 MG PO TABS: 100.0000 mg | ORAL_TABLET | Freq: Two times a day (BID) | ORAL | Status: DC

## 2011-07-31 MED ORDER — NADOLOL 20 MG PO TABS: ORAL_TABLET | ORAL | Status: DC

## 2011-07-31 NOTE — Progress Notes (Signed)
Primary Cardiologist:  Dr Jacinto Halim  The patient presents today for routine electrophysiology followup.  Since last being seen in our clinic, the patient reports doing very well. She feels that her afib is well controlled with flecainide.  She is tolerating this medicine without difficulty. Today, she denies symptoms of palpitations, chest pain, shortness of breath, orthopnea, PND, lower extremity edema, dizziness, presyncope, syncope, or neurologic sequela.  The patient feels that she is tolerating medications without difficulties and is otherwise without complaint today.   Past Medical History  Diagnosis Date  . Atrial fibrillation     paroxysmal  . Bradycardia   . DJD (degenerative joint disease)   . Obesity   . Asthma   . Mild sleep apnea    Past Surgical History  Procedure Date  . Cholecystectomy   . Cesarean section   . Knee surgery   . Neck tumor resection     benign    Current Outpatient Prescriptions  Medication Sig Dispense Refill  . aspirin 81 MG tablet Take 81 mg by mouth daily.        . celecoxib (CELEBREX) 200 MG capsule Take 200 mg by mouth daily.        . flecainide (TAMBOCOR) 100 MG tablet Take 1 tablet (100 mg total) by mouth 2 (two) times daily.  60 tablet  11  . nadolol (CORGARD) 20 MG tablet 1/2 tablet daily  30 tablet  6  . DISCONTD: flecainide (TAMBOCOR) 100 MG tablet Take 1 tablet (100 mg total) by mouth 2 (two) times daily.  60 tablet  6  . DISCONTD: nadolol (CORGARD) 20 MG tablet 1/2 tablet daily  30 tablet  6    No Known Allergies  History   Social History  . Marital Status: Married    Spouse Name: N/A    Number of Children: N/A  . Years of Education: N/A   Occupational History  . Not on file.   Social History Main Topics  . Smoking status: Never Smoker   . Smokeless tobacco: Not on file  . Alcohol Use: Yes     Rarely  . Drug Use: No  . Sexually Active: Not on file   Other Topics Concern  . Not on file   Social History Narrative   Married with 3 children Lives in [place].  Unemployed.    Family History  Problem Relation Age of Onset  . Diabetes Other   . Thyroid disease Other    Physical Exam: Filed Vitals:   07/31/11 1547  BP: 116/64  Pulse: 51  Weight: 225 lb 12.8 oz (102.422 kg)    GEN- The patient is overweight appearing, alert and oriented x 3 today.   Head- normocephalic, atraumatic Eyes-  Sclera clear, conjunctiva pink Ears- hearing intact Oropharynx- clear Neck- supple, no JVP Lymph- no cervical lymphadenopathy Lungs- Clear to ausculation bilaterally, normal work of breathing Heart- Regular rate and rhythm, no murmurs, rubs or gallops, PMI not laterally displaced GI- soft, NT, ND, + BS Extremities- no clubbing, cyanosis, or edema MS- no significant deformity or atrophy Skin- no rash or lesion Psych- euthymic mood, full affect Neuro- strength and sensation are intact  ekg today reveals sinus bradycardia 51 bpm, PR 182, QRS 114, Qtc 451  Assessment and Plan:

## 2011-07-31 NOTE — Assessment & Plan Note (Signed)
Doing well with flecainide and nadolol chads2 score is 0. Continue ASA  No further EP procedures planned at this time Return in 1 year

## 2011-07-31 NOTE — Patient Instructions (Signed)
Your physician wants you to follow-up in: 12 months with Dr Allred You will receive a reminder letter in the mail two months in advance. If you don't receive a letter, please call our office to schedule the follow-up appointment.  

## 2011-07-31 NOTE — Assessment & Plan Note (Signed)
Weight loss advised 

## 2011-07-31 NOTE — Assessment & Plan Note (Signed)
Asymptomatic No indication for pacemaker at this time

## 2011-08-12 ENCOUNTER — Other Ambulatory Visit: Payer: Self-pay | Admitting: Physician Assistant

## 2011-08-12 ENCOUNTER — Ambulatory Visit: Payer: 59

## 2011-08-12 ENCOUNTER — Ambulatory Visit (INDEPENDENT_AMBULATORY_CARE_PROVIDER_SITE_OTHER): Payer: 59 | Admitting: Family Medicine

## 2011-08-12 VITALS — BP 127/75 | HR 47 | Temp 97.9°F | Resp 14 | Ht 61.0 in | Wt 219.0 lb

## 2011-08-12 DIAGNOSIS — M79672 Pain in left foot: Secondary | ICD-10-CM

## 2011-08-12 DIAGNOSIS — M79609 Pain in unspecified limb: Secondary | ICD-10-CM

## 2011-08-12 MED ORDER — CELECOXIB 200 MG PO CAPS: 200.0000 mg | ORAL_CAPSULE | Freq: Every day | ORAL | Status: DC

## 2011-08-12 MED ORDER — TRAMADOL HCL 50 MG PO TABS: 50.0000 mg | ORAL_TABLET | Freq: Three times a day (TID) | ORAL | Status: AC | PRN

## 2011-08-12 NOTE — Progress Notes (Signed)
  Subjective:    Patient ID: Angela Cummings, female    DOB: 12-04-1955, 56 y.o.   MRN: [id number]  HPI4 day h/o Left foot pain.  Worsens as the day goes on. Difficult to bear weight on. +swelling, esp at day's end. NKI.  Pain is in lateral aspect of foot.  Review of Systems  All other systems reviewed and are negative.       Objective:   Physical Exam  Constitutional: She is oriented to person, place, and time. She appears well-developed and well-nourished.  Neck: Neck supple.  Cardiovascular: Normal rate.   Pulmonary/Chest: Effort normal and breath sounds normal.  Musculoskeletal: Normal range of motion. She exhibits edema (mild).       L foot, TTP esp proximal 5th>4th metatarsal  Neurological: She is alert and oriented to person, place, and time.  Skin: Skin is warm and dry.  N-V intact.  No ecchymosis  [facility] reading (PRIMARY) by  Dr. Hal Hope as no fracture     Assessment & Plan:  Metatarsal pain-post op shoe.  Continue celebrex.  Rx ultram.  Consider bone scan/MRI in 1-2 weeks if continues to have pain to R/O stress fracture.  RICE therapy.

## 2011-08-28 ENCOUNTER — Other Ambulatory Visit: Payer: Self-pay | Admitting: Emergency Medicine

## 2012-03-09 ENCOUNTER — Ambulatory Visit (INDEPENDENT_AMBULATORY_CARE_PROVIDER_SITE_OTHER): Payer: 59 | Admitting: Family Medicine

## 2012-03-09 ENCOUNTER — Inpatient Hospital Stay (HOSPITAL_COMMUNITY)
Admission: EM | Admit: 2012-03-09 | Discharge: 2012-03-13 | DRG: 309 | Disposition: A | Discharge status: Home / Self Care | Payer: 59 | Attending: Family Medicine | Admitting: Family Medicine

## 2012-03-09 ENCOUNTER — Encounter (HOSPITAL_COMMUNITY): Payer: Self-pay

## 2012-03-09 ENCOUNTER — Emergency Department (HOSPITAL_COMMUNITY): Payer: 59

## 2012-03-09 ENCOUNTER — Encounter: Payer: Self-pay | Admitting: Family Medicine

## 2012-03-09 VITALS — BP 158/82 | HR 96 | Temp 98.0°F | Resp 16

## 2012-03-09 DIAGNOSIS — I4892 Unspecified atrial flutter: Principal | ICD-10-CM | POA: Diagnosis present

## 2012-03-09 DIAGNOSIS — J45909 Unspecified asthma, uncomplicated: Secondary | ICD-10-CM

## 2012-03-09 DIAGNOSIS — Z9089 Acquired absence of other organs: Secondary | ICD-10-CM

## 2012-03-09 DIAGNOSIS — R079 Chest pain, unspecified: Secondary | ICD-10-CM

## 2012-03-09 DIAGNOSIS — I495 Sick sinus syndrome: Secondary | ICD-10-CM

## 2012-03-09 DIAGNOSIS — I498 Other specified cardiac arrhythmias: Secondary | ICD-10-CM | POA: Diagnosis present

## 2012-03-09 DIAGNOSIS — I4891 Unspecified atrial fibrillation: Secondary | ICD-10-CM

## 2012-03-09 DIAGNOSIS — Z833 Family history of diabetes mellitus: Secondary | ICD-10-CM

## 2012-03-09 DIAGNOSIS — D62 Acute posthemorrhagic anemia: Secondary | ICD-10-CM | POA: Diagnosis present

## 2012-03-09 DIAGNOSIS — Z6841 Body Mass Index (BMI) 40.0 and over, adult: Secondary | ICD-10-CM

## 2012-03-09 DIAGNOSIS — I480 Paroxysmal atrial fibrillation: Secondary | ICD-10-CM | POA: Diagnosis present

## 2012-03-09 DIAGNOSIS — G4733 Obstructive sleep apnea (adult) (pediatric): Secondary | ICD-10-CM | POA: Diagnosis present

## 2012-03-09 LAB — CBC
HCT: 38.3 % (ref 36.0–46.0)
Hemoglobin: 12.4 g/dL (ref 12.0–15.0)
MCHC: 32.4 g/dL (ref 30.0–36.0)
WBC: 6.9 10*3/uL (ref 4.0–10.5)

## 2012-03-09 LAB — POCT I-STAT, CHEM 8
BUN: 10 mg/dL (ref 6–23)
Chloride: 104 mEq/L (ref 96–112)
Creatinine, Ser: 0.8 mg/dL (ref 0.50–1.10)
Potassium: 4.2 mEq/L (ref 3.5–5.1)
Sodium: 140 mEq/L (ref 135–145)

## 2012-03-09 LAB — RAPID URINE DRUG SCREEN, HOSP PERFORMED
Amphetamines: NOT DETECTED
Cocaine: NOT DETECTED
Opiates: POSITIVE — AB
Tetrahydrocannabinol: NOT DETECTED

## 2012-03-09 LAB — TROPONIN I: Troponin I: <0.3 ng/mL (ref <0.30)

## 2012-03-09 MED ORDER — ENOXAPARIN SODIUM 30 MG/0.3ML ~~LOC~~ SOLN
30.0000 mg | SUBCUTANEOUS | Status: DC
  Administered 2012-03-09: 30 mg via SUBCUTANEOUS
  Filled 2012-03-09: qty 0.3

## 2012-03-09 MED ORDER — SODIUM CHLORIDE 0.9 % IJ SOLN
3.0000 mL | Freq: Two times a day (BID) | INTRAMUSCULAR | Status: DC
  Administered 2012-03-11 – 2012-03-13 (×5): 3 mL via INTRAVENOUS

## 2012-03-09 MED ORDER — DILTIAZEM HCL 100 MG IV SOLR
2.5000 mg/h | INTRAVENOUS | Status: DC
  Administered 2012-03-09 – 2012-03-10 (×2): 5 mg/h via INTRAVENOUS
  Administered 2012-03-11: 2.5 mg/h via INTRAVENOUS
  Administered 2012-03-11: 5 mg/h via INTRAVENOUS
  Filled 2012-03-09 (×4): qty 100

## 2012-03-09 MED ORDER — ASPIRIN 81 MG PO CHEW
81.0000 mg | CHEWABLE_TABLET | Freq: Every day | ORAL | Status: DC
  Administered 2012-03-09 – 2012-03-10 (×2): 81 mg via ORAL
  Filled 2012-03-09 (×2): qty 1

## 2012-03-09 MED ORDER — HEPARIN BOLUS VIA INFUSION
4000.0000 [IU] | Freq: Once | INTRAVENOUS | Status: AC
  Administered 2012-03-09: 4000 [IU] via INTRAVENOUS
  Filled 2012-03-09: qty 4000

## 2012-03-09 MED ORDER — SODIUM CHLORIDE 0.9 % IV SOLN
INTRAVENOUS | Status: DC
  Administered 2012-03-11: 250 mL via INTRAVENOUS

## 2012-03-09 MED ORDER — MORPHINE SULFATE 4 MG/ML IJ SOLN
4.0000 mg | Freq: Once | INTRAMUSCULAR | Status: AC
  Administered 2012-03-09: 4 mg via INTRAVENOUS
  Filled 2012-03-09: qty 1

## 2012-03-09 MED ORDER — FLECAINIDE ACETATE 100 MG PO TABS
100.0000 mg | ORAL_TABLET | Freq: Two times a day (BID) | ORAL | Status: DC
  Administered 2012-03-10 – 2012-03-13 (×7): 100 mg via ORAL
  Filled 2012-03-09 (×11): qty 1

## 2012-03-09 MED ORDER — HEPARIN (PORCINE) IN NACL 100-0.45 UNIT/ML-% IJ SOLN
1100.0000 [IU]/h | INTRAMUSCULAR | Status: DC
  Administered 2012-03-09: 1100 [IU]/h via INTRAVENOUS
  Administered 2012-03-10: 950 [IU]/h via INTRAVENOUS
  Administered 2012-03-11: 1100 [IU]/h via INTRAVENOUS
  Filled 2012-03-09 (×3): qty 250

## 2012-03-09 MED ORDER — ASPIRIN 81 MG PO TABS: 81.0000 mg | ORAL_TABLET | Freq: Every day | ORAL | Status: DC

## 2012-03-09 MED ORDER — ALBUTEROL SULFATE HFA 108 (90 BASE) MCG/ACT IN AERS: 2.0000 | INHALATION_SPRAY | Freq: Four times a day (QID) | RESPIRATORY_TRACT | Status: DC | PRN

## 2012-03-09 MED ORDER — DILTIAZEM HCL 50 MG/10ML IV SOLN
25.0000 mg | Freq: Once | INTRAVENOUS | Status: AC
  Administered 2012-03-09: 25 mg via INTRAVENOUS
  Filled 2012-03-09: qty 5

## 2012-03-09 MED ORDER — NADOLOL 20 MG PO TABS
10.0000 mg | ORAL_TABLET | Freq: Every day | ORAL | Status: DC
  Administered 2012-03-09 – 2012-03-10 (×2): 10 mg via ORAL
  Filled 2012-03-09 (×3): qty 1

## 2012-03-09 NOTE — H&P (Signed)
Angela Cummings is an 56 y.o. female.  PCP: Ernesto Rutherford  Cardiologist: [facility].  Chief Complaint:  Chest pain HPI:  56 yo F with a PMHx significant for paroxysmal A fib and asthma presents with acute onset of chest pressure this AM. She was in her normal state of health prior to this AM. The pressure started at 0830. It was diffuse anterior chest pressure with throbbing R jaw pain. She was in bed when the pressure started. She took an 81 mg aspirin and presented to [place]. She was sent via EMS to the ED. She denies URI symptoms, fever, ETOH, IDU. She admits to stress with raising two teenage girls. She reports frequent spontaneously resolving palpitations over the past few weeks.   ED course: She was tachycardic on arrival without Afib, however she then went into rapid Afib. Received  diltiazem 25 IV mg which decreased her rate, but rhythm was still in Afib. By the time I saw her her rhythm was in NSR. She also received morphine 4 mg IV x 1.   Past Medical History  Diagnosis Date  . Atrial fibrillation 11/2010    paroxysmal  . Bradycardia   . DJD (degenerative joint disease)   . Obesity   . Mild sleep apnea   . Asthma    Past Surgical History  Procedure Date  . Cholecystectomy   . Cesarean section   . Knee surgery   . Neck tumor resection     benign  . Hernia repair    Family History  Problem Relation Age of Onset  . Diabetes Other   . Thyroid disease Other    Social History:  reports that she has never smoked. She has never used smokeless tobacco. She reports that she drinks alcohol. She reports that she does not use illicit drugs.  Allergies:  Allergies  Allergen Reactions  . Adhesive (Tape) Itching and Rash   Medications Prior to Admission  Medication Sig Dispense Refill  . albuterol (PROVENTIL HFA;VENTOLIN HFA) 108 (90 BASE) MCG/ACT inhaler Inhale 2 puffs into the lungs every 6 (six) hours as needed. For shortness of breath      . aspirin 81 MG tablet  Take 81 mg by mouth daily.        . celecoxib (CELEBREX) 200 MG capsule Take 1 capsule (200 mg total) by mouth daily.  30 capsule  6  . flecainide (TAMBOCOR) 100 MG tablet Take 1 tablet (100 mg total) by mouth 2 (two) times daily.  60 tablet  11  . Multiple Vitamin (MULTIVITAMIN WITH MINERALS) TABS Take 1 tablet by mouth daily.      . nadolol (CORGARD) 20 MG tablet Take 10 mg by mouth daily.       Pertinent Labs: WBC 6.9 Hgb 12.4 POC trop neg Lytes wnl Glucose 134 UDS: positive for opiods, (she received morphine in the ED)  Studies: EKG: sinus bradycardia, incomplete RBB Dg Chest 2 View  03/09/2012  *RADIOLOGY REPORT*  Clinical Data: Shortness of breath, atrial fibrillation  CHEST - 2 VIEW  Comparison: 11/13/2010  Findings: Calcified right lower lobe granuloma.  The lungs are otherwise essentially clear.  No pleural effusion or pneumothorax.  The heart is top normal in size.  Degenerative changes of the visualized thoracolumbar spine.  IMPRESSION: No evidence of acute cardiopulmonary disease.   Original Report Authenticated By: Charline Bills, M.D.    ROS As per HPI, otherwise negative  Blood pressure 144/58, pulse 119, resp. rate 18, SpO2 98.00%. Physical  Exam  General appearance: alert, cooperative and no distress Head: Normocephalic, without obvious abnormality, atraumatic Eyes: conjunctivae/corneas clear. PERRL, EOM's intact.  Ears: normal TM's and external ear canals both ears Nose: Nares normal. Septum midline. Mucosa normal. No drainage or sinus tenderness. Throat: lips, mucosa, and tongue normal; teeth and gums normal Neck: no adenopathy, no carotid bruit, no JVD, supple, symmetrical, trachea midline and thyroid not enlarged, symmetric, no tenderness/mass/nodules Back: symmetric, no curvature. ROM normal. No CVA tenderness. Lungs: normal work of breathing. Few scattered expiratory wheezes.  Heart: regular rate and rhythm, S1, S2 normal, no murmur, click, rub or  gallop Abdomen: soft, non-tender; bowel sounds normal; no masses,  no organomegaly Extremities: extremities normal, atraumatic, no cyanosis or edema Pulses: 2+ and symmetric Skin: Skin color, texture, turgor normal. No rashes or lesions Neurologic: Grossly normal  Assessment/Plan 56 yo F with acute onset of chest pressure found to be in a fib with RVR rate  1. A fib with RVR in the setting of paroxysmal A fib. CHADVasc score is 2 if you count antihypertensive medication as HTN.  A: improved following diltiazem. Patient "feels great" now. No evidence of heart failure on exam and CXR.  P: Admit to tele for monitoring. Cycle cardiac enzymes Check TSH, A1c and lipid panel  Cardiology consult with [facility] for the following: long term rate control, ? Need for long term anticoagulation.  Daily ASA, continue flecainde and nadol.  2. Asthma: scattered wheezing. Normal WOB. Albuterol prn.   3. FEN/GI: lytes wnl. KVO IVFs. Heart healthy diet.   4. DVT PPx: lovenox.   5. Code: full.  6. Dispo: to home pending continued clinical improvement/rate control.   Harmon Bommarito 03/09/2012, 8:12 PM

## 2012-03-09 NOTE — ED Provider Notes (Signed)
Medical screening examination/treatment/procedure(s) were conducted as a shared visit with non-physician practitioner(s) and myself.  I personally evaluated the patient during the encounter Has hx of afib. C/o chest pressure and sob. No light headedness. No recent illness. No cough, f/c/n/v/d.   Was nl yest.  pe anxious.  afib with rvr. Controlled with dilt. Will admit for further eval and tx.   Cheri Guppy, MD 03/09/12 1431

## 2012-03-09 NOTE — ED Notes (Signed)
Pt with hx of Afib RVR.  Pt woke this morning with c/o chest pain and presented to UC on [address].  Pt found to have HR in 160's.  Pt given 3 ASA at [facility] and had taken 1 ASA at home.  Pt now denies CP. Pt states this is not the fastest her HR has been.

## 2012-03-09 NOTE — Consult Note (Addendum)
Admit date: 03/09/2012 Referring Physician  Dr. Perley Jain Primary Physician  Urgent Care Primary Cardiologist  Dr. Johney Frame Reason for Consultation  afib with RVR  HPI: This is a 56yoWF with a history of PAF with CHADs 2 of 0 controlled on Flecainide and Nadolol who has been having intermittent chest pressure and palpitations for the past few weeks.   This am when she awakened at 8:30am she felt pain in her chest.  She took a baby ASA and then got up and took her other meds.  The pressure on her chest worsened and then radiated into her right jaw.   She went to Urgent Care and was found to be in atrial fibrillation and was subsequently transferred to [facility].  She says that she has had chest pressure before when she was in afib.   She was given 25mg  IV Cardizem in the ER.   She also noticed palpitations today when the pressure started.  Currently she has no chest pressure but remains in afib with RVR.  She denies any SOB, N/V or diaphoresis.     PMH:   Past Medical History  Diagnosis Date  . Atrial fibrillation 11/2010    paroxysmal  . Bradycardia   . DJD (degenerative joint disease)   . Obesity   . Mild sleep apnea   . Asthma      PSH:   Past Surgical History  Procedure Date  . Cholecystectomy   . Cesarean section   . Knee surgery   . Neck tumor resection     benign  . Hernia repair     Allergies:  Adhesive Prior to Admit Meds:   Prescriptions prior to admission  Medication Sig Dispense Refill  . albuterol (PROVENTIL HFA;VENTOLIN HFA) 108 (90 BASE) MCG/ACT inhaler Inhale 2 puffs into the lungs every 6 (six) hours as needed. For shortness of breath      . aspirin 81 MG tablet Take 81 mg by mouth daily.        . celecoxib (CELEBREX) 200 MG capsule Take 1 capsule (200 mg total) by mouth daily.  30 capsule  6  . flecainide (TAMBOCOR) 100 MG tablet Take 1 tablet (100 mg total) by mouth 2 (two) times daily.  60 tablet  11  . Multiple Vitamin (MULTIVITAMIN WITH MINERALS) TABS Take 1 tablet  by mouth daily.      . nadolol (CORGARD) 20 MG tablet Take 10 mg by mouth daily.       Fam HX:    Family History  Problem Relation Age of Onset  . Diabetes Other   . Thyroid disease Other    Social HX:    History   Social History  . Marital Status: Married    Spouse Name: N/A    Number of Children: 3  . Years of Education: N/A   Occupational History  . Not on file.   Social History Main Topics  . Smoking status: Never Smoker   . Smokeless tobacco: Never Used  . Alcohol Use: Yes     Rarely  . Drug Use: No  . Sexually Active: Not on file   Other Topics Concern  . Not on file   Social History Narrative   Married with 3 children (17,30, 57) and granddaughter (14).Lives in [place]. Unemployed.     ROS:  All 11 ROS were addressed and are negative except what is stated in the HPI  Physical Exam: Blood pressure 135/80, pulse 89, temperature 98.2 F (36.8 C), resp. rate  18, weight 100.9 kg (222 lb 7.1 oz), SpO2 100.00%.    General: Well developed, well nourished, in no acute distress Head: Eyes PERRLA, No xanthomas.   Normal cephalic and atramatic  Lungs:   Clear bilaterally to auscultation and percussion. Heart:   Irregularly irregular and tachyS1 S2 Pulses are 2+ & equal.            No carotid bruit. No JVD.  No abdominal bruits. No femoral bruits. Abdomen: Bowel sounds are positive, abdomen soft and non-tender without masses Extremities:   No clubbing, cyanosis or edema.  DP +1 Neuro: Alert and oriented X 3. Psych:  Good affect, responds appropriately    Labs:   Lab Results  Component Value Date   WBC 6.9 03/09/2012   HGB 12.9 03/09/2012   HCT 38.0 03/09/2012   MCV 88.7 03/09/2012   PLT 236 03/09/2012    Lab 03/09/12 1133  NA 140  K 4.2  CL 104  CO2 --  BUN 10  CREATININE 0.80  CALCIUM --  PROT --  BILITOT --  ALKPHOS --  ALT --  AST --  GLUCOSE 134*   No results found for this basename: PTT   No results found for this basename: INR, PROTIME    Lab Results  Component Value Date   CKTOTAL 90 11/13/2010   CKMB 3.0 11/13/2010   TROPONINI <0.30 03/09/2012         Radiology:  Dg Chest 2 View  03/09/2012  *RADIOLOGY REPORT*  Clinical Data: Shortness of breath, atrial fibrillation  CHEST - 2 VIEW  Comparison: 11/13/2010  Findings: Calcified right lower lobe granuloma.  The lungs are otherwise essentially clear.  No pleural effusion or pneumothorax.  The heart is top normal in size.  Degenerative changes of the visualized thoracolumbar spine.  IMPRESSION: No evidence of acute cardiopulmonary disease.   Original Report Authenticated By: Charline Bills, M.D.     EKG:  Atrial fibrillation with RVR and RBBB  ASSESSMENT:  1.  Recurrent afib with RVRof unknown duration  despite Flecainide and nadolol.  It sounds as though she may have been having afib for several weeks based on her symptoms.   2.  Chest pressure most likely secondary to #1 with negative troponin x 1 3.  OSA not on CPAP therapy - she was told that her OSA was not bad enough to be on CPAP 4.  Asthma 5.  History of bradycardia   PLAN:   1.  Start Cardizem gtt at 5mg /hr since HR not controlled 2.  IV Heparin gtt per pharmacy - Heme check stools since she says she had some faint blood on her stool the other day but no history of black stools or GI bleed in the past and hemoglobin normal 3.  Cycle cardiac enzymes 4.  Check 2D echo 5.  Continue Flecainide and Nadolol for now 6.  At this point next best option might be afib ablation so will have Dr. Johney Frame see her on Monday 7.  Consider repeat sleep study if it has been a while otherwise consider CPAP titration  Quintella Reichert, MD  03/09/2012  5:18 PM

## 2012-03-09 NOTE — Progress Notes (Signed)
ANTICOAGULATION CONSULT NOTE - Initial Consult  Pharmacy Consult for Heparin Indication: afib with RVR  Allergies  Allergen Reactions  . Adhesive (Tape) Itching and Rash    Patient Measurements: Weight: 222 lb 7.1 oz (100.9 kg) Heparin Dosing Weight: 74Kg  Vital Signs: Temp: 98.6 F (37 C) (09/28 1959) Temp src: Oral (09/28 1959) BP: 95/47 mmHg (09/28 1959) Pulse Rate: 92  (09/28 1959)  Labs:  Basename 03/09/12 1746 03/09/12 1133 03/09/12 1110 03/09/12 1108  HGB -- 12.9 -- 12.4  HCT -- 38.0 -- 38.3  PLT -- -- -- 236  APTT -- -- -- --  LABPROT -- -- -- --  INR -- -- -- --  HEPARINUNFRC -- -- -- --  CREATININE -- 0.80 -- --  CKTOTAL -- -- -- --  CKMB -- -- -- --  TROPONINI <0.30 -- <0.30 --    The CrCl is unknown because both a height and weight (above a minimum accepted value) are required for this calculation.   Medical History: Past Medical History  Diagnosis Date  . Atrial fibrillation 11/2010    paroxysmal  . Bradycardia   . DJD (degenerative joint disease)   . Obesity   . Mild sleep apnea   . Asthma     Medications:  Prescriptions prior to admission  Medication Sig Dispense Refill  . albuterol (PROVENTIL HFA;VENTOLIN HFA) 108 (90 BASE) MCG/ACT inhaler Inhale 2 puffs into the lungs every 6 (six) hours as needed. For shortness of breath      . aspirin 81 MG tablet Take 81 mg by mouth daily.        . celecoxib (CELEBREX) 200 MG capsule Take 1 capsule (200 mg total) by mouth daily.  30 capsule  6  . flecainide (TAMBOCOR) 100 MG tablet Take 1 tablet (100 mg total) by mouth 2 (two) times daily.  60 tablet  11  . Multiple Vitamin (MULTIVITAMIN WITH MINERALS) TABS Take 1 tablet by mouth daily.      . nadolol (CORGARD) 20 MG tablet Take 10 mg by mouth daily.        Assessment: Patient admitted with intermittent chest pressure and palpitations- found to be in afib at urgent care center. Noted plans to check FOBT d/t patient reports of blood with BMs. H/H and  plts are WNL at admit. Patient was not taking anticoagulants PTA. Noted plans for possible ablation.   Goal of Therapy:  Heparin level 0.3-0.7 units/ml Monitor platelets by anticoagulation protocol: Yes   Plan:  Give 4000 units bolus x 1 Start heparin infusion at 1100 units/hr Check anti-Xa level in 6 hours and daily while on heparin Continue to monitor H&H and platelets daily  Mirna Mires K 03/09/2012,8:19 PM

## 2012-03-09 NOTE — ED Notes (Signed)
Pt went to the bathroom before she was told urine sample was needed. Pt was informed that urine is needed

## 2012-03-09 NOTE — ED Provider Notes (Signed)
History     CSN: [id number]  Arrival date & time 03/09/12  1001   First MD Initiated Contact with Patient 03/09/12 1056      Chief Complaint  Patient presents with  . Atrial Fibrillation    (Consider location/radiation/quality/duration/timing/severity/associated sxs/prior treatment) HPI Comments: 56 year old female with a history of atrial fibrillation presents to the emergency department with chest pain when she woke up this morning. She describes the pain as a pressure like feeling. Pain is constant, radiating to the right side of her jaw, rated 7/10. She took 81mg  aspirin without any relief. Admits to associated palpitations and lightheadedness. She states she has not been feeling well for the past couple days and has not had a great appetite. Denies nausea, vomiting, abdominal pain, shortness of breath, diaphoresis, fever chills.  The history is provided by the patient.    Past Medical History  Diagnosis Date  . Atrial fibrillation     paroxysmal  . Bradycardia   . DJD (degenerative joint disease)   . Obesity   . Asthma   . Mild sleep apnea     Past Surgical History  Procedure Date  . Cholecystectomy   . Cesarean section   . Knee surgery   . Neck tumor resection     benign  . Hernia repair     Family History  Problem Relation Age of Onset  . Diabetes Other   . Thyroid disease Other     History  Substance Use Topics  . Smoking status: Never Smoker   . Smokeless tobacco: Not on file  . Alcohol Use: Yes     Rarely    OB History    Grav Para Term Preterm Abortions TAB SAB Ect Mult Living                  Review of Systems  Constitutional: Positive for appetite change. Negative for fever, chills and diaphoresis.  HENT: Negative for neck pain.   Eyes: Negative for visual disturbance.  Respiratory: Negative for cough and shortness of breath.   Cardiovascular: Positive for chest pain and palpitations. Negative for leg swelling.  Gastrointestinal:  Negative for nausea, vomiting and abdominal pain.  Musculoskeletal: Negative for back pain.  Skin: Negative for color change.  Neurological: Positive for light-headedness. Negative for dizziness and weakness.  Psychiatric/Behavioral: Negative for confusion.    Allergies  Adhesive  Home Medications   Current Outpatient Rx  Name Route Sig Dispense Refill  . ALBUTEROL SULFATE HFA 108 (90 BASE) MCG/ACT IN AERS Inhalation Inhale 2 puffs into the lungs every 6 (six) hours as needed. For shortness of breath    . ASPIRIN 81 MG PO TABS Oral Take 81 mg by mouth daily.      . CELECOXIB 200 MG PO CAPS Oral Take 1 capsule (200 mg total) by mouth daily. 30 capsule 6  . FLECAINIDE ACETATE 100 MG PO TABS Oral Take 1 tablet (100 mg total) by mouth 2 (two) times daily. 60 tablet 11  . ADULT MULTIVITAMIN W/MINERALS CH Oral Take 1 tablet by mouth daily.    Marland Kitchen NADOLOL 20 MG PO TABS Oral Take 10 mg by mouth daily.      BP 130/84  Resp 15  SpO2 100%  Physical Exam  Constitutional: She is oriented to person, place, and time. She appears well-developed and well-nourished. No distress.  HENT:  Head: Normocephalic and atraumatic.  Eyes: Conjunctivae normal and EOM are normal. Pupils are equal, round, and reactive to light.  Neck: Normal range of motion. Neck supple. No JVD present.  Cardiovascular: Regular rhythm and intact distal pulses.  Tachycardia present.   Pulmonary/Chest: Effort normal and breath sounds normal. No respiratory distress.  Abdominal: Soft. Bowel sounds are normal. There is no tenderness.  Musculoskeletal: Normal range of motion.  Neurological: She is alert and oriented to person, place, and time.  Skin: Skin is warm and dry. She is not diaphoretic.  Psychiatric: She has a normal mood and affect. Her behavior is normal.    ED Course  Procedures (including critical care time)  Date: 03/09/2012  Rate: 160  Rhythm: sinus tachycardia  QRS Axis: right  Intervals: widened QRS  ST/T  Wave abnormalities: ST depressions inferiorly  Conduction Disutrbances:right bundle branch block  Narrative Interpretation: changes with tachycardia and st depressions. No stemi or afib  Old EKG Reviewed: changes noted   Labs Reviewed - No data to display Results for orders placed during the hospital encounter of 03/09/12  CBC      Component Value Range   WBC 6.9  4.0 - 10.5 K/uL   RBC 4.32  3.87 - 5.11 MIL/uL   Hemoglobin 12.4  12.0 - 15.0 g/dL   HCT 16.1  09.6 - 04.5 %   MCV 88.7  78.0 - 100.0 fL   MCH 28.7  26.0 - 34.0 pg   MCHC 32.4  30.0 - 36.0 g/dL   RDW 40.9  81.1 - 91.4 %   Platelets 236  150 - 400 K/uL  TROPONIN I      Component Value Range   Troponin I <0.30  <0.30 ng/mL  POCT I-STAT, CHEM 8      Component Value Range   Sodium 140  135 - 145 mEq/L   Potassium 4.2  3.5 - 5.1 mEq/L   Chloride 104  96 - 112 mEq/L   BUN 10  6 - 23 mg/dL   Creatinine, Ser 7.82  0.50 - 1.10 mg/dL   Glucose, Bld 956 (*) 70 - 99 mg/dL   Calcium, Ion 2.13  0.86 - 1.23 mmol/L   TCO2 24  0 - 100 mmol/L   Hemoglobin 12.9  12.0 - 15.0 g/dL   HCT 57.8  46.9 - 62.9 %    Dg Chest 2 View  03/09/2012  *RADIOLOGY REPORT*  Clinical Data: Shortness of breath, atrial fibrillation  CHEST - 2 VIEW  Comparison: 11/13/2010  Findings: Calcified right lower lobe granuloma.  The lungs are otherwise essentially clear.  No pleural effusion or pneumothorax.  The heart is top normal in size.  Degenerative changes of the visualized thoracolumbar spine.  IMPRESSION: No evidence of acute cardiopulmonary disease.   Original Report Authenticated By: Charline Bills, M.D.      No diagnosis found.  12:36 PM Heart rate down to 109-117 after sitting for a while. She states "she does not feel well". Denies nausea or current chest pain, states she "cannot pin point it but I just do not feel well". 12:53 PM Heart rate increasing. Patient in and out of afib. Will give diltiazem. 2:48 PM Diltiazem decreased rate to 70's  to 80's. EKG still showing afib.   Date: 03/09/2012  Rate: 82  Rhythm: atrial fibrillation  QRS Axis: normal  Intervals: normal  ST/T Wave abnormalities: normal  Conduction Disutrbances:right bundle branch block  Narrative Interpretation: no longer tachycardic.    MDM  56 y/o female with Afib presenting with chest pain. She was tachycardic on arrival without Afib, however she then went into  rapid Afib. Gave diltiazem 25 mg which decreased her rate, but rhythm still in Afib. All other labs and CXR unremarkable. She is more comfortable after receiving morphine. She will be admitted to family practice.        Trevor Mace, PA-C 03/09/12 1512

## 2012-03-09 NOTE — ED Provider Notes (Signed)
Medical screening examination/treatment/procedure(s) were conducted as a shared visit with non-physician practitioner(s) and myself.  I personally evaluated the patient during the encounter  Demeka Sutter, MD 03/09/12 1620 

## 2012-03-09 NOTE — Progress Notes (Addendum)
  Subjective:    Patient ID: Angela Cummings, female    DOB: 29-Jan-1956, 56 y.o.   MRN: [id number]  HPI Angela Cummings is a 56 y.o. female Provider pulled acutely to see patient due to chest pain, and brought back emergently.   Hx of afib.   Woke up this morning with chest pain with pain into r jaw.  Started at 8:30 am. Took 81mg  asa at 8:30am.  Lasted for about 5 minutes.  Heart racing.  Cardiologist: Allred.  Pain came back and heart racing again.  No n/v, diaphoresis or dyspnea.  Heart racing off and on with chest pains for weeks, but with radiation to jaw this morning.   Feeling pain again during exam at 0925. Hr 173 during pain.   Review of Systems As above.  No    Objective:   Physical Exam  Constitutional: She is oriented to person, place, and time. She appears well-developed and well-nourished.  HENT:  Head: Normocephalic and atraumatic.  Cardiovascular: Normal pulses.  An irregularly irregular rhythm present. Tachycardia present.        Intermittently into fast rhythm, irregularly irregular at baseline.    Pulmonary/Chest: Effort normal and breath sounds normal.  Neurological: She is alert and oriented to person, place, and time.  Skin: Skin is warm and dry. No rash noted.  Psychiatric: She has a normal mood and affect. Her behavior is normal.       Assessment & Plan:  Angela Cummings is a 56 y.o. female 1. Chest pain   2. A-fib    02nc at 2 liters., monitor placed,  EMS called at 0920 Aspirin 243mg  (addition to home dose of 81mg ) given at 0925. IV placed - 24 gauge L ac at 0925.  Kvo.  Cp subsided when rhythm below 100.  ems assumed care at 0935, chage nurse advised at [facility] ER.

## 2012-03-10 DIAGNOSIS — I059 Rheumatic mitral valve disease, unspecified: Secondary | ICD-10-CM

## 2012-03-10 DIAGNOSIS — I4891 Unspecified atrial fibrillation: Secondary | ICD-10-CM

## 2012-03-10 LAB — TROPONIN I
Troponin I: <0.3 ng/mL (ref <0.30)
Troponin I: <0.3 ng/mL (ref <0.30)

## 2012-03-10 LAB — LIPID PANEL
LDL Cholesterol: 62 mg/dL (ref 0–99)
Triglycerides: 57 mg/dL (ref <150)
VLDL: 11 mg/dL (ref 0–40)

## 2012-03-10 LAB — CBC
MCH: 28.9 pg (ref 26.0–34.0)
MCHC: 32.8 g/dL (ref 30.0–36.0)
MCV: 88.1 fL (ref 78.0–100.0)
Platelets: 184 10*3/uL (ref 150–400)
RBC: 3.95 MIL/uL (ref 3.87–5.11)

## 2012-03-10 LAB — PROTIME-INR: Prothrombin Time: 14.5 seconds (ref 11.6–15.2)

## 2012-03-10 LAB — HEPARIN LEVEL (UNFRACTIONATED): Heparin Unfractionated: 0.34 IU/mL (ref 0.30–0.70)

## 2012-03-10 LAB — HEMOGLOBIN A1C
Hgb A1c MFr Bld: 5.6 % (ref <5.7)
Mean Plasma Glucose: 114 mg/dL (ref <117)

## 2012-03-10 MED ORDER — ACETAMINOPHEN 325 MG PO TABS
650.0000 mg | ORAL_TABLET | Freq: Four times a day (QID) | ORAL | Status: DC | PRN
  Administered 2012-03-10: 650 mg via ORAL
  Filled 2012-03-10: qty 2

## 2012-03-10 NOTE — Progress Notes (Signed)
ANTICOAGULATION CONSULT NOTE - Follow Up Consult  Pharmacy Consult for heparin Indication: atrial fibrillation  Allergies  Allergen Reactions  . Adhesive (Tape) Itching and Rash    Patient Measurements: Weight: 225 lb 12.8 oz (102.422 kg)   Vital Signs: Temp: 98.3 F (36.8 C) (09/29 0358) Temp src: Oral (09/29 0358) BP: 145/77 mmHg (09/29 1023) Pulse Rate: 88  (09/29 1023)  Labs:  Basename 03/10/12 1010 03/10/12 0303 03/10/12 0010 03/09/12 1746 03/09/12 1133 03/09/12 1108  HGB -- 11.4* -- -- 12.9 --  HCT -- 34.8* -- -- 38.0 38.3  PLT -- 184 -- -- -- 236  APTT -- -- -- -- -- --  LABPROT -- 14.5 -- -- -- --  INR -- 1.15 -- -- -- --  HEPARINUNFRC 0.34 0.82* -- -- -- --  CREATININE -- -- -- -- 0.80 --  CKTOTAL -- -- -- -- -- --  CKMB -- -- -- -- -- --  TROPONINI -- <0.30 <0.30 <0.30 -- --    The CrCl is unknown because both a height and weight (above a minimum accepted value) are required for this calculation.   Assessment: Patient is a 56 y.o F on heparin for Afib with plan for possible DCCV if patient doesn't convert.  Heparin level is at goal after rate decreased to 950 units/hr earlier this morning.  No bleeding noted.  Goal of Therapy:  Heparin level 0.3-0.7 units/ml Monitor platelets by anticoagulation protocol: Yes   Plan:  1) continue current heparin regimen  Lorieann Argueta P 03/10/2012,11:50 AM

## 2012-03-10 NOTE — Progress Notes (Signed)
  Echocardiogram 2D Echocardiogram has been performed.  Georgian Co 03/10/2012, 9:50 AM

## 2012-03-10 NOTE — Progress Notes (Signed)
I have seen and examined this patient. I have discussed with Dr Funches.  I agree with their findings and plans as documented in their admission note.    

## 2012-03-10 NOTE — Progress Notes (Signed)
@   Subjective:  Denies CP or dyspnea; no palpitations.   Objective:  Filed Vitals:   03/09/12 2032 03/10/12 0010 03/10/12 0358 03/10/12 0827  BP: 93/58 95/60 106/67 104/65  Pulse:   62 87  Temp:   98.3 F (36.8 C)   TempSrc:   Oral   Resp:   18   Weight:   225 lb 12.8 oz (102.422 kg)   SpO2: 97% 98% 98% 95%    Intake/Output from previous day: No intake or output data in the 24 hours ending 03/10/12 0902  Physical Exam: Physical exam: Well-developed obese in no acute distress.  Skin is warm and dry.  HEENT is normal.  Neck is supple.  Chest is clear to auscultation with normal expansion.  Cardiovascular exam is irregular Abdominal exam nontender or distended. No masses palpated. Extremities show no edema. neuro grossly intact    Lab Results: Basic Metabolic Panel:  Basename 03/09/12 1133  NA 140  K 4.2  CL 104  CO2 --  GLUCOSE 134*  BUN 10  CREATININE 0.80  CALCIUM --  MG --  PHOS --   CBC:  Basename 03/10/12 0303 03/09/12 1133 03/09/12 1108  WBC 7.7 -- 6.9  NEUTROABS -- -- --  HGB 11.4* 12.9 --  HCT 34.8* 38.0 --  MCV 88.1 -- 88.7  PLT 184 -- 236   Cardiac Enzymes:  Basename 03/10/12 0303 03/10/12 0010 03/09/12 1746  CKTOTAL -- -- --  CKMB -- -- --  CKMBINDEX -- -- --  TROPONINI <0.30 <0.30 <0.30     Assessment/Plan:  1) Atrial fibrillation-patient remains in afib this AM but improved symptoms with rate control; continue cardizem and nadalol. Continue heparin and flecanide. Will review with Dr Johney Frame in AM; she has been having some shorter episodes of atrial fibrillation at home. May need to consider alternative antiarrhythmic vs ablation. If patient does not convert, will consider TEE guided DCCV. Echo pending this AM 2) CP - enzymes neg and she only has CP with atrial fibrillation. 3) Acute blood loss anemia- no signs of active bleeding; follow HGB.  Olga Millers 03/10/2012, 9:02 AM

## 2012-03-10 NOTE — Progress Notes (Signed)
ANTICOAGULATION CONSULT NOTE - follow up Pharmacy Consult for Heparin Indication: afib with RVR  Allergies  Allergen Reactions  . Adhesive (Tape) Itching and Rash    Patient Measurements: Weight: 222 lb 7.1 oz (100.9 kg) Heparin Dosing Weight: 74Kg  Vital Signs: Temp: 98.6 F (37 C) (09/28 1959) Temp src: Oral (09/28 1959) BP: 93/58 mmHg (09/28 2032) Pulse Rate: 92  (09/28 1959)  Labs:  Basename 03/10/12 0303 03/10/12 0010 03/09/12 1746 03/09/12 1133 03/09/12 1110 03/09/12 1108  HGB 11.4* -- -- 12.9 -- --  HCT 34.8* -- -- 38.0 -- 38.3  PLT 184 -- -- -- -- 236  APTT -- -- -- -- -- --  LABPROT 14.5 -- -- -- -- --  INR 1.15 -- -- -- -- --  HEPARINUNFRC 0.82* -- -- -- -- --  CREATININE -- -- -- 0.80 -- --  CKTOTAL -- -- -- -- -- --  CKMB -- -- -- -- -- --  TROPONINI -- <0.30 <0.30 -- <0.30 --    The CrCl is unknown because both a height and weight (above a minimum accepted value) are required for this calculation.   Medical History: Past Medical History  Diagnosis Date  . Atrial fibrillation 11/2010    paroxysmal  . Bradycardia   . DJD (degenerative joint disease)   . Obesity   . Mild sleep apnea   . Asthma     Medications:  Prescriptions prior to admission  Medication Sig Dispense Refill  . albuterol (PROVENTIL HFA;VENTOLIN HFA) 108 (90 BASE) MCG/ACT inhaler Inhale 2 puffs into the lungs every 6 (six) hours as needed. For shortness of breath      . aspirin 81 MG tablet Take 81 mg by mouth daily.        . celecoxib (CELEBREX) 200 MG capsule Take 1 capsule (200 mg total) by mouth daily.  30 capsule  6  . flecainide (TAMBOCOR) 100 MG tablet Take 1 tablet (100 mg total) by mouth 2 (two) times daily.  60 tablet  11  . Multiple Vitamin (MULTIVITAMIN WITH MINERALS) TABS Take 1 tablet by mouth daily.      . nadolol (CORGARD) 20 MG tablet Take 10 mg by mouth daily.        Assessment: Patient admitted 03/09/2012  with intermittent chest pressure and palpitations- found  to be in afib at urgent care center. Noted plans to check FOBT d/t patient reports of blood with BMs. H/H and plts are WNL at admit. Patient was not taking anticoagulants PTA. Noted plans for possible ablation.   Anticoagulation: ACS, Afib: Heparin infusing without problems at 1100 units/h, no bleeding noted, heparin level reported > goal.  Goal of Therapy:  Heparin level 0.3-0.7 units/ml Monitor platelets by anticoagulation protocol: Yes   Plan:  Decrease heparin to 950 units/hr Check heparin level 6h after rate change. Daily heparin level and CBC  Thank you for allowing pharmacy to be a part of this patients care team.  Lovenia Kim Pharm.D., BCPS Clinical Pharmacist 03/10/2012 3:49 AM Pager: [phone] Phone: [phone]

## 2012-03-10 NOTE — Progress Notes (Signed)
Family Medicine Teaching Service  Daily Progress Note  [phone]  Patient Assessment: 56 yo F with history of paroxysmal A fib presented with RVR in the setting of recurrent A fib of unknown duration despite flecainide and nadolol. Rate controlled with Cardizem. CHADVasc score of 2. On cardizem gtt and heparin gtt pending evaluation by her electrophysiologist (Dr. Johney Frame) for possible A fib ablation on Monday.   Subjective: Has not complaints this AM. Denies chest pain and palpitations.   Objective: Vital signs in last 24 hours: Temp:  [98 F (36.7 C)-98.6 F (37 C)] 98.3 F (36.8 C) (09/29 0358) Pulse Rate:  [62-119] 62  (09/29 0358) Resp:  [15-18] 18  (09/29 0358) BP: (93-158)/(46-84) 106/67 mmHg (09/29 0358) SpO2:  [97 %-100 %] 98 % (09/29 0358) Weight:  [222 lb 7.1 oz (100.9 kg)] 222 lb 7.1 oz (100.9 kg) (09/28 1701)  General appearance: alert, cooperative and no distress Lungs: clear to auscultation bilaterally Heart: irregularly irregular rhythm Extremities: extremities normal, atraumatic, no cyanosis or edema Neurologic: Grossly normal  Pertinent Labs: Trop neg x 4.  UDS: positive for opiods, (she received morphine in the ED) TSH 1.660 LDL 62 A1c-pending   Studies/Results: 9/28 EKG: sinus bradycardia, incomplete RBB  Dg Chest 2 View  03/09/2012  *RADIOLOGY REPORT*  Clinical Data: Shortness of breath, atrial fibrillation  CHEST - 2 VIEW  Comparison: 11/13/2010  Findings: Calcified right lower lobe granuloma.  The lungs are otherwise essentially clear.  No pleural effusion or pneumothorax.  The heart is top normal in size.  Degenerative changes of the visualized thoracolumbar spine.  IMPRESSION: No evidence of acute cardiopulmonary disease.   Original Report Authenticated By: Charline Bills, M.D.    Medications: I have reviewed all scheduled and prn medications.   Assessment/Plan: 56 yo F with acute onset of chest pressure found to be in a fib with RVR rate   1. A  fib with RVR in the setting of paroxysmal A fib. CHADVasc score is 2 if you count antihypertensive medication as HTN.  A: improved following diltiazem. Patient "feels great" now. No evidence of heart failure on exam and CXR. Remains on diltiazem and heparin drip.  P:  Continue  for monitoring.  Cardiology consulted and planning to set up atrial ablation after two weeks of anticoagulation.  I appreciate cardiology care and input. I anticipate cardiology will transition patient to PO diltiazem today.  Transition to PO anticoagulation today or tomorrow as well, recommend xarelto. Will await cardiology input.  Daily ASA, continue flecainde and nadol.   2. Asthma: lungs clear this AM. Will have albuterol available prn.   3. FEN/GI: lytes wnl. KVO IVFs. Heart healthy diet.   4. DVT PPx: heparin drip.    5. Code: full.   6. Dispo: to home tomorrow after she is evaluated by her electrophysiologist for possible atrial ablation.    LOS: 1 day  [place] Pelto 9:26 AM, 03/10/12

## 2012-03-10 NOTE — H&P (Signed)
I have seen and examined this patient. I have discussed with Funches.  I agree with their findings and plans as documented in their progress note.

## 2012-03-11 DIAGNOSIS — I495 Sick sinus syndrome: Secondary | ICD-10-CM

## 2012-03-11 LAB — CBC
HCT: 39.1 % (ref 36.0–46.0)
MCHC: 33.2 g/dL (ref 30.0–36.0)
MCV: 88.9 fL (ref 78.0–100.0)
Platelets: 223 10*3/uL (ref 150–400)
RDW: 13.5 % (ref 11.5–15.5)
WBC: 5.6 10*3/uL (ref 4.0–10.5)

## 2012-03-11 LAB — HEPARIN LEVEL (UNFRACTIONATED): Heparin Unfractionated: 0.15 IU/mL — ABNORMAL LOW (ref 0.30–0.70)

## 2012-03-11 MED ORDER — NADOLOL 20 MG PO TABS
20.0000 mg | ORAL_TABLET | Freq: Every day | ORAL | Status: DC
  Administered 2012-03-11 – 2012-03-13 (×3): 20 mg via ORAL
  Filled 2012-03-11 (×3): qty 1

## 2012-03-11 MED ORDER — ZOLPIDEM TARTRATE 5 MG PO TABS
10.0000 mg | ORAL_TABLET | Freq: Every evening | ORAL | Status: DC | PRN
  Administered 2012-03-11 – 2012-03-12 (×2): 10 mg via ORAL
  Filled 2012-03-11 (×2): qty 2

## 2012-03-11 MED ORDER — DILTIAZEM HCL ER COATED BEADS 180 MG PO CP24
180.0000 mg | ORAL_CAPSULE | Freq: Every day | ORAL | Status: DC
  Administered 2012-03-11 – 2012-03-13 (×3): 180 mg via ORAL
  Filled 2012-03-11 (×3): qty 1

## 2012-03-11 MED ORDER — DABIGATRAN ETEXILATE MESYLATE 150 MG PO CAPS
150.0000 mg | ORAL_CAPSULE | Freq: Two times a day (BID) | ORAL | Status: DC
  Administered 2012-03-11 – 2012-03-13 (×5): 150 mg via ORAL
  Filled 2012-03-11 (×6): qty 1

## 2012-03-11 NOTE — Consult Note (Signed)
CARDIOLOGY CONSULT NOTE  Patient ID: Angela Cummings MRN: [id number] DOB/AGE: 10-22-1955 56 y.o.  Admit date: 03/09/2012 Referring Physician: Hillis Range, MD Primary Physician: No primary provider on file. Reason for Consultation: A. Fibrillation and chest pain  HPI: Patient is a 56 year old Caucasian female with history of Axis I atrial fibrillation and history of emergency room evaluation a year ago with SVT, atrial fibrillation, morbid, doing well until 3 days ago, Was admitted to the hospital with atrial fibrillation with rapid response, chest pressure and chest heaviness.  She [facility], was transferred to [facility] and received IV Cardizem, with immediate relief of chest discomfort and control of heart rate.She was ruled out for myocardial infarction.  It is felt that she has failed flecainide, antiarrhythmic therapy for paroxysmal atrial fibrillation in and probably needs atrial fibrillation ablation.  Due to chest pain she was referred to me to see whether she needs further cardiac risk stratification. She has had a sleep study in the past and has been ruled out for obstructive sleep apnea.  Presently she is doing well and denies any chest pain, shortness breath, palpitations.  She states that she's been under a large stress for the last 4-6 months, especially in the last one month.  She was doing well on flecainide for atrial fibrillation without any palpitations, however in the last one month she has had on and off brief episodes of palpitations and it got worse 3 days ago where it was continuous.  She could not continue on with this and felt very weak, fatigued, shortness of breath, palpitations, chest discomfort.  When she presented to the urgent care which eventually led her to being transferred to the hospital. Patient states that since being on medications in the hospital, she is essentially asymptomatic.  She denies any TIA, neurological  weaknesses, PND, orthopnea, leg edema, hemoptysis, syncope.  Past Medical History  Diagnosis Date  . Atrial fibrillation 11/2010    paroxysmal  . Bradycardia   . DJD (degenerative joint disease)   . Obesity   . Mild sleep apnea   . Asthma      Past Surgical History  Procedure Date  . Cholecystectomy   . Cesarean section   . Knee surgery   . Neck tumor resection     benign  . Hernia repair      Family History  Problem Relation Age of Onset  . Diabetes Other   . Thyroid disease Other     Social History: History   Social History  . Marital Status: Married    Spouse Name: N/A    Number of Children: 3  . Years of Education: N/A   Occupational History  . Not on file.   Social History Main Topics  . Smoking status: Never Smoker   . Smokeless tobacco: Never Used  . Alcohol Use: Yes     Rarely  . Drug Use: No  . Sexually Active: Not on file   Other Topics Concern  . Not on file   Social History Narrative   Married with 3 children (17,30, 63) and granddaughter (14).Lives in [place]. Unemployed.     Prescriptions prior to admission  Medication Sig Dispense Refill  . albuterol (PROVENTIL HFA;VENTOLIN HFA) 108 (90 BASE) MCG/ACT inhaler Inhale 2 puffs into the lungs every 6 (six) hours as needed. For shortness of breath      . aspirin 81 MG tablet Take 81 mg by mouth daily.        Marland Kitchen  celecoxib (CELEBREX) 200 MG capsule Take 1 capsule (200 mg total) by mouth daily.  30 capsule  6  . flecainide (TAMBOCOR) 100 MG tablet Take 1 tablet (100 mg total) by mouth 2 (two) times daily.  60 tablet  11  . Multiple Vitamin (MULTIVITAMIN WITH MINERALS) TABS Take 1 tablet by mouth daily.      . nadolol (CORGARD) 20 MG tablet Take 10 mg by mouth daily.        ROS: General: no fevers/chills/night sweats Eyes: no blurry vision, diplopia, or amaurosis ENT: no sore throat or hearing loss Resp: no cough, wheezing, or hemoptysis CV: no edema or palpitations GI: no abdominal pain,  nausea, vomiting, diarrhea, or constipation GU: no dysuria, frequency, or hematuria Skin: no rash Neuro: no headache, numbness, tingling, or weakness of extremities Musculoskeletal: no joint pain or swelling Heme: no bleeding, DVT, or easy bruising Endo: no polydipsia or polyuria    Physical Exam: Blood pressure 107/69, pulse 90, temperature 97.7 F (36.5 C), temperature source Oral, resp. rate 18, weight 102.422 kg (225 lb 12.8 oz), SpO2 97.00%.   GENERAL: Moderately built and morbidly body habitus, appears stated  age.   No acute distress noted.   Alert and Ox3.   NECK: Supple, no masses, no thyromegaly, no JVD.   CHEST: Lungs clear, no rales, no rhonchi, no wheezes.   HEART: S1 is variable, S2 normal,  no murmurs, no rubs, no gallops.   ABDOMEN: Obese, Soft, no tenderness, no masses, BS normal.   Pannus present.   BACK: Normal curvature, no tenderness.   EXTREMITIES: Full range of motion, no deformities, no edema, no erythema.   NEURO: Alert, oriented x 3,  no localizing findings.   SKIN: Normal, no rashes, no lesions noted.   No ulcerations or signs of limb ischemia.   Vascular exam: Normal carotid upstroke.   No bruit.   Abdominal aortic palpation appears to be normal without prominent pulsation.   No bruit.   Femoral and pedal pulses are normal. Labs:   Lab Results  Component Value Date   WBC 5.6 03/11/2012   HGB 13.0 03/11/2012   HCT 39.1 03/11/2012   MCV 88.9 03/11/2012   PLT 223 03/11/2012    Lab 03/09/12 1133  NA 140  K 4.2  CL 104  CO2 --  BUN 10  CREATININE 0.80  CALCIUM --  PROT --  BILITOT --  ALKPHOS --  ALT --  AST --  GLUCOSE 134*   Lab Results  Component Value Date   CKTOTAL 90 11/13/2010   CKMB 3.0 11/13/2010   TROPONINI <0.30 03/10/2012    Lipid Panel     Component Value Date/Time   CHOL 143 03/10/2012 0303   TRIG 57 03/10/2012 0303   HDL 70 03/10/2012 0303   CHOLHDL 2.0 03/10/2012 0303   VLDL 11 03/10/2012 0303   LDLCALC 62 03/10/2012 0303    EKG::  03/09/2012: Atrial fibrillation with rapid response, right bundle branch block, nonspecific inferior and lateral ST segment depression suggestive of inferior lateral ischemia. Telemetry: Atrial fibrillation with average rate of 68-76  Beats per minute.  ASSESSMENT AND PLAN:   1. Paroxysmal Atrial fibrillation  With rapid AV conduction.   Rappid ventricular response.    She was hospitalized at  [facility] on 11/13/10 for chest pain and was found to be in SVT with a heart rate of 212 bpm. 2. Morbid obesity.    3. Atypical chest pain probably not angina pectoris.  No significant chest pain since heart rate controlled and cardiac markers negative for ishcemia/infarct. Overall symptomatically feels much better. Lexiscan Myoview 11/21/10 Diaphragmatic attenuation artifact without ischemia. Low risk stress. Exercise Treadmill Test 02/22/11: No recurrence of A. Fib on flecainide on exercise testing performed by Dr. Johney Frame. No ischemic EKG changes.  4. DJD.   Recommendation: At this point I think that she needs cardiac ischemia workup.  I think the EKG changes were due to atrial fibrillation with rapid ventricular response.  She is presently on Xarelto for anticoagulation, Will continue the same.  I have stopped her IV Cardizem and switched over to by mouth Cardizem along with the beta blocker.  She'll be set up for repeat cardioversion tomorrow morning.  Patient understands the risks, benefits and alternatives to G cardioversion.  This includes esophageal perforation, bleeding, trauma, aspiration pneumonia but not limited to these.  Patient is willing to proceed.  If she converts to sinus rhythm, she can be discharged home in the morning with outpatient f/u. Dr. Hillis Range plans on performing atrial fibrillation ablation in 4 weeks time from now.  Pamella Pert, MD 03/11/2012, 7:29 PM

## 2012-03-11 NOTE — Progress Notes (Signed)
ANTICOAGULATION CONSULT NOTE - Follow Up Consult  Pharmacy Consult for heparin Indication: atrial fibrillation  Labs:  Basename 03/11/12 0600 03/10/12 1010 03/10/12 0303 03/10/12 0010 03/09/12 1746 03/09/12 1133 03/09/12 1108  HGB 13.0 -- 11.4* -- -- -- --  HCT 39.1 -- 34.8* -- -- 38.0 --  PLT 223 -- 184 -- -- -- 236  APTT -- -- -- -- -- -- --  LABPROT -- -- 14.5 -- -- -- --  INR -- -- 1.15 -- -- -- --  HEPARINUNFRC 0.15* 0.34 0.82* -- -- -- --  CREATININE -- -- -- -- -- 0.80 --  CKTOTAL -- -- -- -- -- -- --  CKMB -- -- -- -- -- -- --  TROPONINI -- -- <0.30 <0.30 <0.30 -- --    Assessment: 56yo female now subtherapeutic on heparin after one level at goal (though was at low end of goal).  Goal of Therapy:  Heparin level 0.3-0.7 units/ml   Plan:  Will increase heparin gtt to 1100 units/hr (was previously supratherapeutic at this rate though may have been due to bolus) and check level in 6hr.  Colleen Can PharmD BCPS 03/11/2012,7:29 AM

## 2012-03-11 NOTE — Progress Notes (Signed)
Family Medicine Teaching Service  Daily Progress Note  [phone]  Patient Assessment: 56 yo F with history of paroxysmal A fib presented with RVR in the setting of recurrent A fib of unknown duration despite flecainide and nadolol. Rate controlled with Cardizem. CHADVasc score of 2. On cardizem gtt and heparin gtt pending evaluation by her electrophysiologist (Dr. Johney Frame) for possible A fib ablation on Monday.   Subjective: Has no complaints this AM. Denies chest pain and palpitations. Ate breakfast well. She would like to go home.  Objective: Vital signs in last 24 hours: Temp:  [97.5 F (36.4 C)-98.3 F (36.8 C)] 97.5 F (36.4 C) (09/30 0423) Pulse Rate:  [82-89] 88  (09/30 0423) Resp:  [18] 18  (09/30 0423) BP: (111-145)/(67-77) 117/67 mmHg (09/30 0423) SpO2:  [96 %-99 %] 98 % (09/30 0423)  General appearance: alert, cooperative and no distress Lungs: clear to auscultation bilaterally Heart: irregularly irregular rhythm Extremities: extremities normal, atraumatic, no cyanosis or edema Neurologic: Grossly normal  Pertinent Labs: Trop neg x 4.  UDS: positive for opiods, (she received morphine in the ED) TSH 1.660 LDL 62 A1c-pending   Studies/Results: 9/28 EKG: sinus bradycardia, incomplete RBB  Dg Chest 2 View  03/09/2012  *RADIOLOGY REPORT*  Clinical Data: Shortness of breath, atrial fibrillation  CHEST - 2 VIEW  Comparison: 11/13/2010  Findings: Calcified right lower lobe granuloma.  The lungs are otherwise essentially clear.  No pleural effusion or pneumothorax.  The heart is top normal in size.  Degenerative changes of the visualized thoracolumbar spine.  IMPRESSION: No evidence of acute cardiopulmonary disease.   Original Report Authenticated By: Charline Bills, M.D.    Medications: I have reviewed all scheduled and prn medications.   Assessment/Plan: 56 yo F with acute onset of chest pressure found to be in a fib with RVR rate   1. A fib with RVR in the setting of  paroxysmal A fib. CHADVasc score is 2 if you count antihypertensive medication as HTN.  A: improved following diltiazem. Patient "feels great" now. Would like to go home. P:  - Continue  for monitoring.  - will stop heparin and initiate pradaxa at this time. Cardio feels it is reasonable to proceed with TEE guided cardioversion tomorrow, after she has had her 3rd dose of pradaxa  - Continue IV cardizem for now.  - Therapeutic strategies for afib including medicine and ablation were discussed in detail with the patient today. Risk, benefits, and alternatives to EP study and radiofrequency ablation for afib were also discussed in detail today. These risks include but are not limited to stroke, bleeding, vascular damage, tamponade, perforation, damage to the esophagus, lungs, and other structures, pulmonary vein stenosis, worsening renal function, and death. The patient understands these risk and wishes to proceed. Cardio will therefore proceed with catheter ablation once the patient has been adequately anticoagulated for 4 weeks.  - We will continue flecainide at this time. - Daily  nadol.   2. Asthma: lungs clear this AM. Will have albuterol available prn. She reports she only uses this once every few months.   3. FEN/GI: lytes wnl. KVO IVFs. Heart healthy diet.   4. DVT PPx: discontinued heparin. Pradaxa substituted. TEE guided cardioversion tomorrow after 3rd dose of Pradaxa.  will stop heparin and initiate pradaxa at this time. I think that it would be reasonable to proceed with TEE guided cardioversion tomorrow, after she has had her 3rd dose of pradaxa Continue IV cardizem for now.  Therapeutic strategies for afib  including medicine and ablation were discussed in detail with the patient today. Risk, benefits, and alternatives to EP study and radiofrequency ablation for afib were also discussed in detail today. These risks include but are not limited to stroke, bleeding, vascular damage,  tamponade, perforation, damage to the esophagus, lungs, and other structures, pulmonary vein stenosis, worsening renal function, and death. The patient understands these risk and wishes to proceed. We will therefore proceed with catheter ablation once the patient has been adequately anticoagulated for 4 weeks. We will continue flecainide at this time.  5. Code: full.   6. Dispo: Pending TEE guided cardioversion tolerance tomorrow.   LOS: 2 days  Angela Cummings 8:55 AM, 03/10/12

## 2012-03-11 NOTE — Care Management Note (Unsigned)
    Page 1 of 1   03/11/2012     11:21:54 AM   CARE MANAGEMENT NOTE 03/11/2012  Patient:  Angela Cummings, Angela Cummings   Account Number:  [id number]  Date Initiated:  03/11/2012  Documentation initiated by:  SIMMONS,Daphnie Venturini  Subjective/Objective Assessment:   ADMITTED WITH CP/ AFIB; LIVES AT HOME WITH HER HUSBAND AND ADULT KIDS; WAS IPTA.     Action/Plan:   DISCHARGE PLANNING DISCUSSED AT BEDSIDE.   Anticipated DC Date:  03/12/2012   Anticipated DC Plan:  HOME/SELF CARE      DC Planning Services  CM consult      Choice offered to / List presented to:             Status of service:  In process, will continue to follow Medicare Important Message given?   (If response is "NO", the following Medicare IM given date fields will be blank) Date Medicare IM given:   Date Additional Medicare IM given:    Discharge Disposition:    Per UR Regulation:  Reviewed for med. necessity/level of care/duration of stay  If discussed at Long Length of Stay Meetings, dates discussed:    Comments:  03/11/12  1121  Isobel Eisenhuth SIMMONS RN, BSN [phone] NCM WILL FOLLOW.

## 2012-03-11 NOTE — Progress Notes (Signed)
03/11/2012 10:11 AM Nursing note Pt. bp noted 95/63 right arm and 101/64 left arm. HR 94 Afib. Dr. Johney Frame paged and made aware. Verbal orders received ok to give scheduled po medications and decrease Cardizem gtt. Orders enacted. Will continue to closely monitor patient.   Sarahy Creedon, Blanchard Kelch

## 2012-03-11 NOTE — Discharge Summary (Signed)
Physician Discharge Summary  Patient ID: Angela Cummings MRN: [id number] DOB/AGE: 58/20/1957 58 y.o.  Admit date: 03/09/2012 Discharge date: 03/13/2012  Admission Diagnoses: Atrial fibrillation with RVR Asthma  Discharge Diagnoses:  Principal Problem:  *Atrial fibrillation with RVR Active Problems:  Atrial fibrillation  Asthma   Discharged Condition: stable  Hospital Course: HPI:  56 yo F with a PMHx significant for paroxysmal A fib and asthma presents with acute onset of chest pressure. She was in her normal state of health prior to admission.  She took an 81 mg aspirin and presented to [place]. She was sent via EMS to the ED. She reports frequent spontaneously resolving palpitations over the past few weeks. ED course: She was tachycardic on arrival without Afib, however she then went into rapid Afib. Received diltiazem 25 IV mg which decreased her rate, but rhythm was still in Afib.  She also received morphine 4 mg IV x 1. Patient was placed on telemetry bed, flecnainide, nadolol and cardizem IV for rate and rhythm control. Cardiology was consulted; patient was started on Pradaxa, dose of nadolol increased, and cardizem transitioned from IV to PO. TEE cardioversion was performed on 10/2. Pt tolerated procedure well and was discharged home with instructions to follow-up with PCP and Dr. Johney Frame, who will plan for possible cardiac ablation in the next four weeks.  Consults: Cardiology [facility])  Disposition: 01-Home or Self Care  Issues for follow-up: 1. A-fib: Pt started on Pradaxa and Cardizem 24h-release while hospitalized, continued at time of discharge. Also on flecainide and nadolol (dose increased from 10 mg to 20 mg daily during hospitalization). Please address any further changes to medications. 2. Pt to be seen in follow-up by Dr. Johney Frame and may have cardiac ablation in the next four weeks or so (with anticoagulation with Pradaxa).    Medication List     As of 03/13/2012  12:03 PM    TAKE these medications         albuterol 108 (90 BASE) MCG/ACT inhaler   Commonly known as: PROVENTIL HFA;VENTOLIN HFA   Inhale 2 puffs into the lungs every 6 (six) hours as needed. For shortness of breath      aspirin 81 MG tablet   Take 81 mg by mouth daily.      celecoxib 200 MG capsule   Commonly known as: CELEBREX   Take 1 capsule (200 mg total) by mouth daily.      dabigatran 150 MG Caps   Commonly known as: PRADAXA   Take 1 capsule (150 mg total) by mouth every 12 (twelve) hours.      diltiazem 180 MG 24 hr capsule   Commonly known as: CARDIZEM CD   Take 1 capsule (180 mg total) by mouth daily.      flecainide 100 MG tablet   Commonly known as: TAMBOCOR   Take 1 tablet (100 mg total) by mouth 2 (two) times daily.      multivitamin with minerals Tabs   Take 1 tablet by mouth daily.      nadolol 20 MG tablet   Commonly known as: CORGARD   Take 1 tablet (20 mg total) by mouth daily.         Follow-up Information    Follow up with Hillis Range, MD. (Dr. Jenel Lucks office will call with an appointment for follow-up.)    Contact information:   [address], [address] [place] [address] [phone]          Signed: Steaven Wholey,  Carter Kassel 03/13/2012, 12:03 PM

## 2012-03-11 NOTE — Progress Notes (Signed)
FMTS Attending Daily Note:  Renold Don MD  [phone] pager  Family Practice pager:  [phone] I have seen and examined this patient and have reviewed their chart. I have discussed this patient with the resident. I agree with the resident's findings, assessment and care plan.  Patient doing well today.  For TEE and possible cardioversion tomorrow.

## 2012-03-11 NOTE — Progress Notes (Signed)
SUBJECTIVE: The patient is doing well today.  At this time, she denies chest pain, shortness of breath, or any new concerns.  She wants to go home soon.     Marland Kitchen aspirin  81 mg Oral Daily  . dabigatran  150 mg Oral Q12H  . flecainide  100 mg Oral BID  . nadolol  10 mg Oral Daily  . sodium chloride  3 mL Intravenous Q12H      . sodium chloride 250 mL (03/11/12 0416)  . diltiazem (CARDIZEM) infusion 5 mg/hr (03/11/12 0616)  . DISCONTD: heparin 1,100 Units/hr (03/11/12 0735)    OBJECTIVE: Physical Exam: Filed Vitals:   03/10/12 1023 03/10/12 1357 03/10/12 1900 03/11/12 0423  BP: 145/77 111/67 117/76 117/67  Pulse: 88 82 89 88  Temp:  97.8 F (36.6 C) 98.3 F (36.8 C) 97.5 F (36.4 C)  TempSrc:  Oral Oral Oral  Resp:  18 18 18   Weight:      SpO2: 99% 96% 97% 98%    Intake/Output Summary (Last 24 hours) at 03/11/12 0824 Last data filed at 03/10/12 1800  Gross per 24 hour  Intake 1284.05 ml  Output      0 ml  Net 1284.05 ml    Telemetry reveals afib  GEN- The patient is overweight appearing, alert and oriented x 3 today.   Head- normocephalic, atraumatic Eyes-  Sclera clear, conjunctiva pink Ears- hearing intact Oropharynx- clear Lungs- Clear to ausculation bilaterally, normal work of breathing Heart- irregular rate and rhythm, no murmurs, rubs or gallops, PMI not laterally displaced GI- soft, NT, ND, + BS Extremities- no clubbing, cyanosis, or edema Skin- no rash or lesion Psych- euthymic mood, full affect Neuro- strength and sensation are intact  LABS: Basic Metabolic Panel:  Basename 03/09/12 1133  NA 140  K 4.2  CL 104  CO2 --  GLUCOSE 134*  BUN 10  CREATININE 0.80  CALCIUM --  MG --  PHOS --   Liver Function Tests: No results found for this basename: AST:2,ALT:2,ALKPHOS:2,BILITOT:2,PROT:2,ALBUMIN:2 in the last 72 hours No results found for this basename: LIPASE:2,AMYLASE:2 in the last 72 hours CBC:  Basename 03/11/12 0600 03/10/12 0303    WBC 5.6 7.7  NEUTROABS -- --  HGB 13.0 11.4*  HCT 39.1 34.8*  MCV 88.9 88.1  PLT 223 184   Cardiac Enzymes:  Basename 03/10/12 0303 03/10/12 0010 03/09/12 1746  CKTOTAL -- -- --  CKMB -- -- --  CKMBINDEX -- -- --  TROPONINI <0.30 <0.30 <0.30   BNP: No components found with this basename: POCBNP:3 D-Dimer: No results found for this basename: DDIMER:2 in the last 72 hours Hemoglobin A1C:  Basename 03/09/12 2209  HGBA1C 5.6   Fasting Lipid Panel:  Basename 03/10/12 0303  CHOL 143  HDL 70  LDLCALC 62  TRIG 57  CHOLHDL 2.0  LDLDIRECT --   Thyroid Function Tests:  Basename 03/09/12 1601  TSH 1.660  T4TOTAL --  T3FREE --  THYROIDAB --   Anemia Panel: No results found for this basename: VITAMINB12,FOLATE,FERRITIN,TIBC,IRON,RETICCTPCT in the last 72 hours    ASSESSMENT AND PLAN:  Principal Problem:  *Atrial fibrillation with RVR Active Problems:  Atrial fibrillation   1. Atrial fibrillation- the patient presents with RVR.  Her initial EKGs are very regular and may actually represent atrial flutter.  The duration of her afib is not completely clear but is likely > 48 hours.  I would therefore recommend that we initiate an anticoagulant and then proceed with TEE guided  cardioversion. I will stop heparin and initiate pradaxa at this time.  I think that it would be reasonable to proceed with TEE guided cardioversion tomorrow, after she has had her 3rd dose of pradaxa (she has been previously anticoagulated with heparin during this hospitalization). Continue IV cardizem for now. Therapeutic strategies for afib including medicine and ablation were discussed in detail with the patient today. Risk, benefits, and alternatives to EP study and radiofrequency ablation for afib were also discussed in detail today. These risks include but are not limited to stroke, bleeding, vascular damage, tamponade, perforation, damage to the esophagus, lungs, and other structures, pulmonary  vein stenosis, worsening renal function, and death. The patient understands these risk and wishes to proceed.  We will therefore proceed with catheter ablation once the patient has been adequately anticoagulated for 4 weeks.  We will continue flecainide at this time.  2. Chest pain- she has had chest discomfort with afib in the past.  CMs are negative.  She has had per my prior office notes a normal stress test in the past. I will defer decisions about any further CV risk stratification to Dr Jacinto Halim who knows her well.  3. Bradycardia- she has had sinus bradycardia in the past.  We will follow her rhythm once back in sinus.     Hillis Range, MD 03/11/2012 8:24 AM

## 2012-03-12 MED ORDER — LORAZEPAM 0.5 MG PO TABS: 0.5000 mg | ORAL_TABLET | Freq: Every evening | ORAL | Status: DC | PRN

## 2012-03-12 NOTE — Progress Notes (Signed)
Family Medicine Teaching Service  Daily Progress Note  [id number]  Subjective: Pt seen at bedside. Feels okay this morning. States she is ready to go home and "wants to get everything over and done with."   Objective: Vital signs in last 24 hours: Temp:  [97.2 F (36.2 C)-97.8 F (36.6 C)] 97.2 F (36.2 C) (10/01 0657) Pulse Rate:  [85-93] 85  (10/01 0657) Resp:  [17-18] 17  (10/01 0657) BP: (95-107)/(63-69) 105/63 mmHg (10/01 0657) SpO2:  [95 %-99 %] 99 % (10/01 0657) Physical exam: Gen: adult female moving about room without assistance, alert, cooperative Cardio: rhythm irregularly irregular, rate  Pulm: CTAB, no wheezes Ext: moves all extremities equally, gait normal Neuro: no gross focal deficits  Pertinent Labs: Trop neg x 4.  UDS: positive for opiods, (she received morphine in the ED) TSH 1.660 Lipid panel 9/29: TG 57, HDL 70, LDL 62 A1c-5.6 (9/8)  Studies/Results: EKG, 9/28: sinus bradycardia, incomplete RBB  CXR, 9/28 Findings: Calcified right lower lobe granuloma. The lungs are  otherwise essentially clear. No pleural effusion or pneumothorax.  The heart is top normal in size.  Degenerative changes of the visualized thoracolumbar spine.  IMPRESSION:  No evidence of acute cardiopulmonary disease.  Assessment/Plan: 56 yo F with acute onset of chest pressure found to be in a fib with RVR, admitted 9/28.  1. Afib with RVR in the setting of paroxysmal A fib - CHADVasc score 2. Currently rate-controlled with diltiazem (changed to PO from IV on 9/30). Plan - Continue Pradaxa, flecainide. Nadolol 20 mg daily and Cardizem PO per Dr. Jacinto Halim (cardiology). Pt to go for TEE cardioversion today, per Dr. Johney Frame. Pt to follow-up with [facility] for ablation in the future, as well.  2. Asthma: No current shortness of breath or complaint of wheezing. Lungs clear on exam. Per pt, albuterol only used once every few months. Plan - Continue albuterol PRN.  3. FEN/GI: Heart  healthy diet; NPO this morning for TEE cardioversion.   4. DVT PPx: On Pradaxa, per cardiology.  5. Code: full.   6. Dispo: Possibly home today after TEE guided cardioversion.   LOS: 3 days  Daegan Arizmendi, [place] 8:46 AM, 03/10/12

## 2012-03-12 NOTE — Progress Notes (Signed)
FMTS Attending Daily Note:  Jeff Walden MD  [phone] pager  Family Practice pager:  [phone] I have seen and examined this patient and have reviewed their chart. I have discussed this patient with the resident. I agree with the resident's findings, assessment and care plan.     

## 2012-03-12 NOTE — Progress Notes (Signed)
SUBJECTIVE: The patient is doing well today.  At this time, she denies chest pain, shortness of breath, or any new concerns.      . dabigatran  150 mg Oral Q12H  . diltiazem  180 mg Oral Daily  . flecainide  100 mg Oral BID  . nadolol  20 mg Oral Daily  . sodium chloride  3 mL Intravenous Q12H  . DISCONTD: aspirin  81 mg Oral Daily  . DISCONTD: nadolol  10 mg Oral Daily      . DISCONTD: sodium chloride 250 mL (03/11/12 0416)  . DISCONTD: diltiazem (CARDIZEM) infusion 2.5 mg/hr (03/11/12 1037)  . DISCONTD: heparin Stopped (03/11/12 1158)    OBJECTIVE: Physical Exam: Filed Vitals:   03/11/12 0944 03/11/12 1349 03/11/12 1957 03/12/12 0657  BP: 95/63 107/69 107/67 105/63  Pulse: 92 90 93 85  Temp:  97.7 F (36.5 C) 97.8 F (36.6 C) 97.2 F (36.2 C)  TempSrc:  Oral Oral Oral  Resp:  18 18 17   Weight:      SpO2:  97% 95% 99%   No intake or output data in the 24 hours ending 03/12/12 0733  Telemetry reveals afib with adequate rate control  GEN- The patient is overweight appearing, alert and oriented x 3 today.   Head- normocephalic, atraumatic Eyes-  Sclera clear, conjunctiva pink Ears- hearing intact Oropharynx- clear Lungs- Clear to ausculation bilaterally, normal work of breathing Heart- irregular rate and rhythm, no murmurs, rubs or gallops, PMI not laterally displaced GI- soft, NT, ND, + BS Extremities- no clubbing, cyanosis, or edema Skin- no rash or lesion Psych- euthymic mood, full affect Neuro- strength and sensation are intact  LABS: Basic Metabolic Panel:  Basename 03/09/12 1133  NA 140  K 4.2  CL 104  CO2 --  GLUCOSE 134*  BUN 10  CREATININE 0.80  CALCIUM --  MG --  PHOS --   Liver Function Tests: No results found for this basename: AST:2,ALT:2,ALKPHOS:2,BILITOT:2,PROT:2,ALBUMIN:2 in the last 72 hours No results found for this basename: LIPASE:2,AMYLASE:2 in the last 72 hours CBC:  Basename 03/11/12 0600 03/10/12 0303  WBC 5.6 7.7    NEUTROABS -- --  HGB 13.0 11.4*  HCT 39.1 34.8*  MCV 88.9 88.1  PLT 223 184   Cardiac Enzymes:  Basename 03/10/12 0303 03/10/12 0010 03/09/12 1746  CKTOTAL -- -- --  CKMB -- -- --  CKMBINDEX -- -- --  TROPONINI <0.30 <0.30 <0.30   BNP: No components found with this basename: POCBNP:3 D-Dimer: No results found for this basename: DDIMER:2 in the last 72 hours Hemoglobin A1C:  Basename 03/09/12 2209  HGBA1C 5.6   Fasting Lipid Panel:  Basename 03/10/12 0303  CHOL 143  HDL 70  LDLCALC 62  TRIG 57  CHOLHDL 2.0  LDLDIRECT --   Thyroid Function Tests:  Basename 03/09/12 1601  TSH 1.660  T4TOTAL --  T3FREE --  THYROIDAB --   Anemia Panel: No results found for this basename: VITAMINB12,FOLATE,FERRITIN,TIBC,IRON,RETICCTPCT in the last 72 hours    ASSESSMENT AND PLAN:  Principal Problem:  *Atrial fibrillation with RVR Active Problems:  Atrial fibrillation   1. Atrial fibrillation-  TEE guided cardioversion later today with anticipated discharge afterwards Resume current medicines upon discharge if not too bradycardic in sinus My office will contact pt to arrange follow-up and ablation  2. Bradycardia- she has had sinus bradycardia in the past.  We will follow her rhythm once back in sinus.     Hillis Range, MD 03/12/2012  7:33 AM

## 2012-03-12 NOTE — Progress Notes (Signed)
I was called to see patient regarding TEE-guided DCCV, which was scheduled to be done today. However, I have been made aware that the schedule in endoscopy will not allow for her procedure to be done today. Therefore, she has been scheduled for TEE-guided DCCV in AM on 03/13/2012. I discussed this with Angela Cummings who was disappointed but understanding. She can resume her heart healthy diet and will be NPO after midnight tonight for TEE-guided DCCV in AM.

## 2012-03-12 NOTE — Progress Notes (Signed)
03/12/2012 9:41 AM Nursing note Spoke with pharmacist regarding timing of this am dose of PO Cardizem. Ok to administer med as scheduled, continue to monitor BP per pharmacy. Will continue to monitor patient.  Makyi Ledo, [place]

## 2012-03-13 ENCOUNTER — Inpatient Hospital Stay (HOSPITAL_COMMUNITY): Payer: 59 | Admitting: Anesthesiology

## 2012-03-13 ENCOUNTER — Encounter (HOSPITAL_COMMUNITY)
Admission: EM | Disposition: A | Discharge status: Home / Self Care | Payer: Self-pay | Source: Home / Self Care | Attending: Family Medicine

## 2012-03-13 ENCOUNTER — Encounter (HOSPITAL_COMMUNITY): Payer: Self-pay | Admitting: Anesthesiology

## 2012-03-13 ENCOUNTER — Encounter (HOSPITAL_COMMUNITY): Payer: Self-pay

## 2012-03-13 DIAGNOSIS — I4892 Unspecified atrial flutter: Secondary | ICD-10-CM

## 2012-03-13 HISTORY — PX: TEE WITHOUT CARDIOVERSION: SHX5443

## 2012-03-13 HISTORY — PX: CARDIOVERSION: SHX1299

## 2012-03-13 SURGERY — ECHOCARDIOGRAM, TRANSESOPHAGEAL
Anesthesia: General

## 2012-03-13 MED ORDER — NADOLOL 20 MG PO TABS: 20.0000 mg | ORAL_TABLET | Freq: Every day | ORAL | Status: DC

## 2012-03-13 MED ORDER — FENTANYL CITRATE 0.05 MG/ML IJ SOLN
INTRAMUSCULAR | Status: AC
  Filled 2012-03-13: qty 2

## 2012-03-13 MED ORDER — LACTATED RINGERS IV SOLN
INTRAVENOUS | Status: DC
  Administered 2012-03-13: 11:00:00 via INTRAVENOUS

## 2012-03-13 MED ORDER — FLUMAZENIL 0.5 MG/5ML IV SOLN
INTRAVENOUS | Status: DC | PRN
  Administered 2012-03-13: 0.3 mg via INTRAVENOUS

## 2012-03-13 MED ORDER — PROPOFOL 10 MG/ML IV BOLUS
INTRAVENOUS | Status: DC | PRN
  Administered 2012-03-13: 60 mg via INTRAVENOUS

## 2012-03-13 MED ORDER — DILTIAZEM HCL ER COATED BEADS 180 MG PO CP24: 180.0000 mg | ORAL_CAPSULE | Freq: Every day | ORAL | Status: DC

## 2012-03-13 MED ORDER — LACTATED RINGERS IV SOLN
INTRAVENOUS | Status: DC | PRN
  Administered 2012-03-13: 13:00:00 via INTRAVENOUS

## 2012-03-13 MED ORDER — BUTAMBEN-TETRACAINE-BENZOCAINE 2-2-14 % EX AERO
INHALATION_SPRAY | CUTANEOUS | Status: DC | PRN
  Administered 2012-03-13: 2 via TOPICAL

## 2012-03-13 MED ORDER — FENTANYL CITRATE 0.05 MG/ML IJ SOLN
INTRAMUSCULAR | Status: DC | PRN
  Administered 2012-03-13: 25 ug via INTRAVENOUS
  Administered 2012-03-13: 50 ug via INTRAVENOUS

## 2012-03-13 MED ORDER — DABIGATRAN ETEXILATE MESYLATE 150 MG PO CAPS: 150.0000 mg | ORAL_CAPSULE | Freq: Two times a day (BID) | ORAL | Status: DC

## 2012-03-13 MED ORDER — MIDAZOLAM HCL 10 MG/2ML IJ SOLN
INTRAMUSCULAR | Status: DC | PRN
  Administered 2012-03-13: 1 mg via INTRAVENOUS
  Administered 2012-03-13: 2 mg via INTRAVENOUS

## 2012-03-13 MED ORDER — MIDAZOLAM HCL 5 MG/ML IJ SOLN
INTRAMUSCULAR | Status: AC
  Filled 2012-03-13: qty 2

## 2012-03-13 MED ORDER — FLUMAZENIL 0.5 MG/5ML IV SOLN
INTRAVENOUS | Status: AC
  Filled 2012-03-13: qty 5

## 2012-03-13 NOTE — Preoperative (Signed)
Beta Blockers   Reason not to administer Beta Blockers:Not Applicable 

## 2012-03-13 NOTE — CV Procedure (Signed)
    Transesophageal Echocardiogram Note  Angela Cummings [id number] 24-Apr-1956  Procedure: Transesophageal Echocardiogram Indications: atrial Flutter  Procedure Details Consent: Obtained Time Out: Verified patient identification, verified procedure, site/side was marked, verified correct patient position, special equipment/implants available, Radiology Safety Procedures followed,  medications/allergies/relevent history reviewed, required imaging and test results available.  Performed  Medications: Fentanyl: 75 mcg IV Versed: 3 mg IV  Left Ventrical:  Mild LV dysfunction - EF ~ 40%  Mitral Valve: trace MR  Aortic Valve: normal AoV  Tricuspid Valve: normal TV  Pulmonic Valve: normal PV  Left Atrium/ Left atrial appendage: no thrombi, + spontaneous contrast in LA  Atrial septum: normal  Aorta: normal   Complications: No apparent complications Patient did tolerate procedure well.   Vesta Mixer, [name], MD, [facility] 03/13/2012, 1:23 PM      Cardioversion Note  Angela Cummings [id number] Nov 03, 1955  Procedure: DC Cardioversion Indications: atrial flutter  Procedure Details Consent: Obtained Time Out: Verified patient identification, verified procedure, site/side was marked, verified correct patient position, special equipment/implants available, Radiology Safety Procedures followed,  medications/allergies/relevent history reviewed, required imaging and test results available.  Performed  The patient has been on adequate anticoagulation.  The patient received Propofol 60 mg IV for sedation.  Synchronous cardioversion was performed at 30 joules.  The cardioversion was successful    Complications: No apparent complications Patient did tolerate procedure well.   Vesta Mixer, [name], MD, [facility] 03/13/2012, 1:24 PM

## 2012-03-13 NOTE — Anesthesia Postprocedure Evaluation (Signed)
  Anesthesia Post-op Note  Patient: Angela Cummings  Procedure(s) Performed: Procedure(s) (LRB) with comments: TRANSESOPHAGEAL ECHOCARDIOGRAM (TEE) (N/A) - Rm 2029 CARDIOVERSION (N/A)  Patient Location: Endoscopy Unit  Anesthesia Type: General  Level of Consciousness: awake and sedated  Airway and Oxygen Therapy: Patient Spontanous Breathing and Patient connected to nasal cannula oxygen  Post-op Pain: none  Post-op Assessment: Post-op Vital signs reviewed and Patient's Cardiovascular Status Stable  Post-op Vital Signs: stable  Complications: No apparent anesthesia complications

## 2012-03-13 NOTE — Anesthesia Preprocedure Evaluation (Signed)
Anesthesia Evaluation  Patient identified by MRN, date of birth, ID band Patient awake    Airway Mallampati: II TM Distance: >3 FB Neck ROM: Full    Dental  (+) Lower Dentures and Upper Dentures   Pulmonary asthma , sleep apnea ,          Cardiovascular + dysrhythmias Atrial Fibrillation Rhythm:Irregular     Neuro/Psych    GI/Hepatic   Endo/Other    Renal/GU      Musculoskeletal   Abdominal   Peds  Hematology   Anesthesia Other Findings   Reproductive/Obstetrics                           Anesthesia Physical Anesthesia Plan  ASA: III  Anesthesia Plan: General   Post-op Pain Management:    Induction: Intravenous  Airway Management Planned:   Additional Equipment:   Intra-op Plan:   Post-operative Plan:   Informed Consent: I have reviewed the patients History and Physical, chart, labs and discussed the procedure including the risks, benefits and alternatives for the proposed anesthesia with the patient or authorized representative who has indicated his/her understanding and acceptance.   Dental advisory given  Plan Discussed with: CRNA and Anesthesiologist  Anesthesia Plan Comments:         Anesthesia Quick Evaluation

## 2012-03-13 NOTE — Progress Notes (Signed)
   SUBJECTIVE: The patient is doing well today.  At this time, she denies chest pain, shortness of breath, or any new concerns.      . dabigatran  150 mg Oral Q12H  . diltiazem  180 mg Oral Daily  . flecainide  100 mg Oral BID  . nadolol  20 mg Oral Daily  . sodium chloride  3 mL Intravenous Q12H      . lactated ringers      OBJECTIVE: Physical Exam: Filed Vitals:   03/12/12 1352 03/12/12 2059 03/13/12 0453 03/13/12 0457  BP: 104/68 108/49 104/74 148/74  Pulse: 72 87 92 87  Temp: 98.9 F (37.2 C) 97.8 F (36.6 C) 98.2 F (36.8 C) 97.9 F (36.6 C)  TempSrc: Oral Oral Oral Oral  Resp: 18 18 18 18  Height: 5' 1.02" (1.55 m)     Weight: 225 lb 12 oz (102.4 kg)     SpO2: 100% 99% 98% 96%    Intake/Output Summary (Last 24 hours) at 03/13/12 0855 Last data filed at 03/12/12 2200  Gross per 24 hour  Intake    840 ml  Output      0 ml  Net    840 ml    Telemetry reveals atrial flutter with adequate rate control  GEN- The patient is overweight appearing, alert and oriented x 3 today.   Head- normocephalic, atraumatic Eyes-  Sclera clear, conjunctiva pink Ears- hearing intact Oropharynx- clear Lungs- Clear to ausculation bilaterally, normal work of breathing Heart- irregular rate and rhythm, no murmurs, rubs or gallops, PMI not laterally displaced GI- soft, NT, ND, + BS Extremities- no clubbing, cyanosis, or edema Skin- no rash or lesion Psych- euthymic mood, full affect Neuro- strength and sensation are intact  LABS: Basic Metabolic Panel: No results found for this basename: NA:2,K:2,CL:2,CO2:2,GLUCOSE:2,BUN:2,CREATININE:2,CALCIUM:2,MG:2,PHOS:2 in the last 72 hours Liver Function Tests: No results found for this basename: AST:2,ALT:2,ALKPHOS:2,BILITOT:2,PROT:2,ALBUMIN:2 in the last 72 hours No results found for this basename: LIPASE:2,AMYLASE:2 in the last 72 hours CBC:  Basename 03/11/12 0600  WBC 5.6  NEUTROABS --  HGB 13.0  HCT 39.1  MCV 88.9  PLT 223     ASSESSMENT AND PLAN:  Principal Problem:  *Atrial fibrillation with RVR Active Problems:  Atrial fibrillation   1. Atrial fibrillation/ atrial flutter-  TEE guided cardioversion later today with anticipated discharge afterwards Resume current medicines upon discharge if not too bradycardic in sinus My office will contact pt to arrange follow-up and ablation  2. Bradycardia- she has had sinus bradycardia in the past.  We will follow her rhythm once back in sinus.     Gabreal Worton, MD 03/13/2012 8:55 AM  

## 2012-03-13 NOTE — H&P (View-Only) (Signed)
   SUBJECTIVE: The patient is doing well today.  At this time, she denies chest pain, shortness of breath, or any new concerns.      . dabigatran  150 mg Oral Q12H  . diltiazem  180 mg Oral Daily  . flecainide  100 mg Oral BID  . nadolol  20 mg Oral Daily  . sodium chloride  3 mL Intravenous Q12H      . lactated ringers      OBJECTIVE: Physical Exam: Filed Vitals:   03/12/12 1352 03/12/12 2059 03/13/12 0453 03/13/12 0457  BP: 104/68 108/49 104/74 148/74  Pulse: 72 87 92 87  Temp: 98.9 F (37.2 C) 97.8 F (36.6 C) 98.2 F (36.8 C) 97.9 F (36.6 C)  TempSrc: Oral Oral Oral Oral  Resp: 18 18 18 18   Height: 5' 1.02" (1.55 m)     Weight: 225 lb 12 oz (102.4 kg)     SpO2: 100% 99% 98% 96%    Intake/Output Summary (Last 24 hours) at 03/13/12 0855 Last data filed at 03/12/12 2200  Gross per 24 hour  Intake    840 ml  Output      0 ml  Net    840 ml    Telemetry reveals atrial flutter with adequate rate control  GEN- The patient is overweight appearing, alert and oriented x 3 today.   Head- normocephalic, atraumatic Eyes-  Sclera clear, conjunctiva pink Ears- hearing intact Oropharynx- clear Lungs- Clear to ausculation bilaterally, normal work of breathing Heart- irregular rate and rhythm, no murmurs, rubs or gallops, PMI not laterally displaced GI- soft, NT, ND, + BS Extremities- no clubbing, cyanosis, or edema Skin- no rash or lesion Psych- euthymic mood, full affect Neuro- strength and sensation are intact  LABS: Basic Metabolic Panel: No results found for this basename: NA:2,K:2,CL:2,CO2:2,GLUCOSE:2,BUN:2,CREATININE:2,CALCIUM:2,MG:2,PHOS:2 in the last 72 hours Liver Function Tests: No results found for this basename: AST:2,ALT:2,ALKPHOS:2,BILITOT:2,PROT:2,ALBUMIN:2 in the last 72 hours No results found for this basename: LIPASE:2,AMYLASE:2 in the last 72 hours CBC:  Basename 03/11/12 0600  WBC 5.6  NEUTROABS --  HGB 13.0  HCT 39.1  MCV 88.9  PLT 223     ASSESSMENT AND PLAN:  Principal Problem:  *Atrial fibrillation with RVR Active Problems:  Atrial fibrillation   1. Atrial fibrillation/ atrial flutter-  TEE guided cardioversion later today with anticipated discharge afterwards Resume current medicines upon discharge if not too bradycardic in sinus My office will contact pt to arrange follow-up and ablation  2. Bradycardia- she has had sinus bradycardia in the past.  We will follow her rhythm once back in sinus.     Hillis Range, MD 03/13/2012 8:55 AM

## 2012-03-13 NOTE — Progress Notes (Signed)
Family Medicine Teaching Service  Daily Progress Note  [phone]  Subjective: Pt seen at bedside. States her heart rate has been "in and out of afib." Denies any chest pressure/pain, SOB, discomfort. Continues to be anxious to have cardioversion done so she can go home.  Objective: Vital signs in last 24 hours: Temp:  [97.8 F (36.6 C)-98.9 F (37.2 C)] 97.9 F (36.6 C) (10/02 0457) Pulse Rate:  [72-92] 87  (10/02 0457) Resp:  [18] 18  (10/02 0457) BP: (104-148)/(49-74) 148/74 mmHg (10/02 0457) SpO2:  [96 %-100 %] 96 % (10/02 0457) Weight:  [225 lb 12 oz (102.4 kg)] 225 lb 12 oz (102.4 kg) (10/01 1352) Physical exam: Gen: adult female in NAD, alert, cooperative Cardio: regular rate/rhythm on exam with intermittent afib on telemetry review, normal rate Pulm: CTAB, no wheezes Ext: moves all extremities equally, warm/well-perfused, distal pulses intact Neuro: no gross focal deficits  Pertinent Labs: Trop neg x 4.  UDS: positive for opiods, (she received morphine in the ED) TSH 1.660 Lipid panel 9/29: TG 57, HDL 70, LDL 62 A1c-5.6 (9/28)  Studies/Results: EKG, 9/28: sinus bradycardia, incomplete RBB  CXR, 9/28 Findings: Calcified right lower lobe granuloma. The lungs are  otherwise essentially clear. No pleural effusion or pneumothorax.  The heart is top normal in size.  Degenerative changes of the visualized thoracolumbar spine.  IMPRESSION:  No evidence of acute cardiopulmonary disease.  Assessment/Plan: 56 yo F with acute onset of chest pressure found to be in a fib with RVR, admitted 9/28. Currently rhythm in and out of afib, with good rate control.  1. Afib with RVR in the setting of paroxysmal A fib - CHADVasc score 2. Currently rate-controlled with diltiazem (changed to PO from IV on 9/30). Plan - TEE cardioversion today at 12:30 per Dr. Johney Frame. Continue Pradaxa, flecainide. Nadolol 20 mg daily and Cardizem PO.Pt to follow-up with [facility] for ablation in  the future, as well.  2. Asthma: No current shortness of breath or complaint of wheezing. Lungs clear on exam. Per pt, albuterol only uses once every few months. Plan - Continue albuterol PRN.  FEN/GI: Heart healthy diet; NPO this morning for TEE cardioversion.  DVT PPx: On Pradaxa, per cardiology. Code: full.  Dispo: Likely home today after TEE guided cardioversion.   LOS: 4 days  Delaine Hernandez, [place] 8:59 AM, 03/10/12

## 2012-03-13 NOTE — Progress Notes (Signed)
*  PRELIMINARY RESULTS* Echocardiogram TEE has been performed.  Jeryl Columbia 03/13/2012, 1:47 PM

## 2012-03-13 NOTE — Progress Notes (Signed)
FMTS Attending Daily Note:  Jeff Toniesha Zellner MD  [phone] pager  Family Practice pager:  [phone] I have seen and examined this patient and have reviewed their chart. I have discussed this patient with the resident. I agree with the resident's findings, assessment and care plan.     

## 2012-03-13 NOTE — Transfer of Care (Signed)
Immediate Anesthesia Transfer of Care Note  Patient: Angela Cummings  Procedure(s) Performed: Procedure(s) (LRB) with comments: TRANSESOPHAGEAL ECHOCARDIOGRAM (TEE) (N/A) - Rm 2029 CARDIOVERSION (N/A)  Patient Location: PACU  Anesthesia Type: MAC  Level of Consciousness: awake, alert  and oriented  Airway & Oxygen Therapy: Patient Spontanous Breathing and Patient connected to nasal cannula oxygen  Post-op Assessment: Report given to PACU RN, Post -op Vital signs reviewed and stable and Patient moving all extremities X 4  Post vital signs: Reviewed and stable  Complications: No apparent anesthesia complications

## 2012-03-13 NOTE — Progress Notes (Signed)
D/c instructions provided along with medlist. IV d/c'd with catheter intact. Patient refused transport to lobby. Ambulated to lobby for transport home. Mamie Levers

## 2012-03-13 NOTE — Interval H&P Note (Signed)
History and Physical Interval Note:  03/13/2012 1:07 PM  Angela Cummings  has presented today for surgery, with the diagnosis of AFib  The various methods of treatment have been discussed with the patient and family. After consideration of risks, benefits and other options for treatment, the patient has consented to  Procedure(s) (LRB) with comments: TRANSESOPHAGEAL ECHOCARDIOGRAM (TEE) (N/A) - Rm 2029 CARDIOVERSION (N/A) as a surgical intervention .  The patient's history has been reviewed, patient examined, no change in status, stable for surgery.  I have reviewed the patient's chart and labs.  Questions were answered to the patient's satisfaction.     Elyn Aquas.

## 2012-03-14 ENCOUNTER — Encounter (HOSPITAL_COMMUNITY): Payer: Self-pay | Admitting: Cardiovascular Disease

## 2012-03-14 ENCOUNTER — Telehealth: Payer: Self-pay | Admitting: Internal Medicine

## 2012-03-14 MED ORDER — NADOLOL 20 MG PO TABS: 20.0000 mg | ORAL_TABLET | Freq: Every day | ORAL | Status: DC

## 2012-03-14 NOTE — Telephone Encounter (Signed)
New Problem:    Patient called in needing a 90 day refill of her nadolol (CORGARD) 20 MG tablet.

## 2012-03-15 NOTE — Discharge Summary (Signed)
Family Medicine Teaching Service  Discharge Note : Attending Jeff Charnel Giles MD Pager [phone] Inpatient Team Pager:  [phone]  I have seen and examined this patient, reviewed their chart and discussed discharge planning with the resident at the time of discharge. I agree with the discharge plan as above.  

## 2012-04-07 ENCOUNTER — Other Ambulatory Visit: Payer: Self-pay | Admitting: Physician Assistant

## 2012-04-10 ENCOUNTER — Encounter: Payer: Self-pay | Admitting: Internal Medicine

## 2012-04-10 ENCOUNTER — Ambulatory Visit (INDEPENDENT_AMBULATORY_CARE_PROVIDER_SITE_OTHER): Payer: 59 | Admitting: Internal Medicine

## 2012-04-10 VITALS — BP 106/54 | HR 46 | Ht 61.4 in | Wt 225.0 lb

## 2012-04-10 DIAGNOSIS — I4891 Unspecified atrial fibrillation: Secondary | ICD-10-CM

## 2012-04-10 NOTE — Assessment & Plan Note (Signed)
Maintaining sinus rhythm since recent cardioversion. Her CHADS2VASC score is 1 (female).  I will therefore stop pradaxa once 30 days post cardioversion. Stop cardizem today due to bradycardia No further changes at this time

## 2012-04-10 NOTE — Progress Notes (Signed)
The patient presents today for routine electrophysiology followup.  She was recently hospitalized with afib and RVR.  She required cardioversion and was placed on pradaxa.  She has done well since her hospital discharge and has had no further symptoms of afib.  She has occasional dizziness and fatigue.  Today, she denies symptoms of palpitations, chest pain, shortness of breath, orthopnea, PND, lower extremity edema, dizziness, presyncope, syncope, or neurologic sequela.  The patient feels that she is tolerating medications without difficulties and is otherwise without complaint today.   Past Medical History  Diagnosis Date  . Atrial fibrillation 11/2010    paroxysmal  . Bradycardia   . DJD (degenerative joint disease)   . Obesity   . Mild sleep apnea   . Asthma    Past Surgical History  Procedure Date  . Cholecystectomy   . Cesarean section   . Knee surgery   . Neck tumor resection     benign  . Hernia repair   . Tee without cardioversion 03/13/2012    Procedure: TRANSESOPHAGEAL ECHOCARDIOGRAM (TEE);  Surgeon: Vesta Mixer, MD;  Location: [facility] ENDOSCOPY;  Service: Cardiovascular;  Laterality: N/A;  Rm 2029  . Cardioversion 03/13/2012    Procedure: CARDIOVERSION;  Surgeon: Vesta Mixer, MD;  Location: [facility] ENDOSCOPY;  Service: Cardiovascular;  Laterality: N/A;    Current Outpatient Prescriptions  Medication Sig Dispense Refill  . albuterol (PROVENTIL HFA;VENTOLIN HFA) 108 (90 BASE) MCG/ACT inhaler Inhale 2 puffs into the lungs every 6 (six) hours as needed. For shortness of breath      . aspirin 81 MG tablet Take 81 mg by mouth daily.        . CELEBREX 200 MG capsule TAKE ONE CAPSULE BY MOUTH EVERY DAY  30 capsule  0  . flecainide (TAMBOCOR) 100 MG tablet Take 1 tablet (100 mg total) by mouth 2 (two) times daily.  60 tablet  11  . Multiple Vitamin (MULTIVITAMIN WITH MINERALS) TABS Take 1 tablet by mouth daily.      . nadolol (CORGARD) 20 MG tablet Take 1 tablet (20 mg total) by  mouth daily.  90 tablet  3    Allergies  Allergen Reactions  . Adhesive (Tape) Itching and Rash    History   Social History  . Marital Status: Married    Spouse Name: N/A    Number of Children: 3  . Years of Education: N/A   Occupational History  . Not on file.   Social History Main Topics  . Smoking status: Never Smoker   . Smokeless tobacco: Never Used  . Alcohol Use: Yes     Rarely  . Drug Use: No  . Sexually Active: Not on file   Other Topics Concern  . Not on file   Social History Narrative   Married with 3 children (17,30, 50) and granddaughter (14).Lives in [place]. Unemployed.    Family History  Problem Relation Age of Onset  . Diabetes Other   . Thyroid disease Other     Physical Exam: Filed Vitals:   04/10/12 1205  BP: 106/54  Pulse: 46  Height: 5' 1.4" (1.56 m)  Weight: 225 lb (102.059 kg)    GEN- The patient is well appearing, alert and oriented x 3 today.   Head- normocephalic, atraumatic Eyes-  Sclera clear, conjunctiva pink Ears- hearing intact Oropharynx- clear Neck- supple, no JVP Lymph- no cervical lymphadenopathy Lungs- Clear to ausculation bilaterally, normal work of breathing Heart- Regular rate and rhythm, no murmurs,  rubs or gallops, PMI not laterally displaced GI- soft, NT, ND, + BS Extremities- no clubbing, cyanosis, or edema Neuro- strength and sensation are intact  ekg today reveals sinus bradycardia 46 bpm, PR 180, QRS 116, Qtc 446, incomplete RBBB  Assessment and Plan:

## 2012-04-10 NOTE — Patient Instructions (Addendum)
Your physician recommends that you schedule a follow-up appointment in: 2 months with Dr Johney Frame   Your physician has recommended you make the following change in your medication:  1) Stop Pradaxa 2) Stop Diltiazem

## 2012-05-08 ENCOUNTER — Other Ambulatory Visit: Payer: Self-pay | Admitting: Physician Assistant

## 2012-06-17 ENCOUNTER — Ambulatory Visit (INDEPENDENT_AMBULATORY_CARE_PROVIDER_SITE_OTHER): Payer: 59 | Admitting: Internal Medicine

## 2012-06-17 ENCOUNTER — Encounter: Payer: Self-pay | Admitting: Internal Medicine

## 2012-06-17 VITALS — BP 122/73 | HR 60 | Ht 61.0 in | Wt 230.0 lb

## 2012-06-17 DIAGNOSIS — E669 Obesity, unspecified: Secondary | ICD-10-CM

## 2012-06-17 DIAGNOSIS — I4891 Unspecified atrial fibrillation: Secondary | ICD-10-CM

## 2012-06-17 MED ORDER — PROPAFENONE HCL ER 225 MG PO CP12: 225.0000 mg | ORAL_CAPSULE | Freq: Two times a day (BID) | ORAL | Status: DC

## 2012-06-17 NOTE — Assessment & Plan Note (Signed)
Maintaining sinus rhythm, though dizziness may be related to flecainide. I will therefore switch flecainide to rhythmol SR 225mg  BID today.  If her symptoms persist then she should follow-up with primary care for evaluation of possible vertigo.  No other changes at this time

## 2012-06-17 NOTE — Patient Instructions (Addendum)
Your physician recommends that you schedule a follow-up appointment in: 3 months with Dr Johney Frame   Your physician has recommended you make the following change in your medication:  1) Stop Flecainide 2) Start Rythmol SR 225 twice daily

## 2012-06-17 NOTE — Progress Notes (Signed)
The patient presents today for routine electrophysiology followup. She has had no further afib since her last visit.  Her primary concern today is with dizziness.  This occurs with change in position or quick head movement.  She describes occasional vertiginous spinning.   Today, she denies symptoms of palpitations, chest pain, shortness of breath, orthopnea, PND, lower extremity edema,  presyncope, syncope, or neurologic sequela.  She continues to struggle with weight loss.  The patient feels that she is tolerating medications without difficulties and is otherwise without complaint today.   Past Medical History  Diagnosis Date  . Atrial fibrillation 11/2010    paroxysmal  . Bradycardia   . DJD (degenerative joint disease)   . Obesity   . Mild sleep apnea   . Asthma    Past Surgical History  Procedure Date  . Cholecystectomy   . Cesarean section   . Knee surgery   . Neck tumor resection     benign  . Hernia repair   . Tee without cardioversion 03/13/2012    Procedure: TRANSESOPHAGEAL ECHOCARDIOGRAM (TEE);  Surgeon: Vesta Mixer, MD;  Location: [facility] ENDOSCOPY;  Service: Cardiovascular;  Laterality: N/A;  Rm 2029  . Cardioversion 03/13/2012    Procedure: CARDIOVERSION;  Surgeon: Vesta Mixer, MD;  Location: [facility] ENDOSCOPY;  Service: Cardiovascular;  Laterality: N/A;    Current Outpatient Prescriptions  Medication Sig Dispense Refill  . albuterol (PROVENTIL HFA;VENTOLIN HFA) 108 (90 BASE) MCG/ACT inhaler Inhale 2 puffs into the lungs every 6 (six) hours as needed. For shortness of breath      . aspirin 81 MG tablet Take 81 mg by mouth daily.        . CELEBREX 200 MG capsule TAKE 1 CAPSULE BY MOUTH EVERY DAY  30 capsule  0  . Multiple Vitamin (MULTIVITAMIN WITH MINERALS) TABS Take 1 tablet by mouth daily.      . nadolol (CORGARD) 20 MG tablet Take 10 mg by mouth daily.      . propafenone (RYTHMOL SR) 225 MG 12 hr capsule Take 1 capsule (225 mg total) by mouth 2 (two) times daily.  60  capsule  6    Allergies  Allergen Reactions  . Adhesive (Tape) Itching and Rash    History   Social History  . Marital Status: Married    Spouse Name: N/A    Number of Children: 3  . Years of Education: N/A   Occupational History  . Not on file.   Social History Main Topics  . Smoking status: Never Smoker   . Smokeless tobacco: Never Used  . Alcohol Use: Yes     Comment: Rarely  . Drug Use: No  . Sexually Active: Not on file   Other Topics Concern  . Not on file   Social History Narrative   Married with 3 children (17,30, 32) and granddaughter (14).Lives in [place]. Unemployed.    Family History  Problem Relation Age of Onset  . Diabetes Other   . Thyroid disease Other     Physical Exam: Filed Vitals:   06/17/12 1104 06/17/12 1105 06/17/12 1108 06/17/12 1111  BP: 150/81 147/70 138/78 122/73  Pulse: 57 61 58 60  Height:      Weight:      SpO2:        GEN- The patient is well appearing, alert and oriented x 3 today.   Head- normocephalic, atraumatic Eyes-  Sclera clear, conjunctiva pink Ears- hearing intact Oropharynx- clear Neck- supple, no JVP Lymph-  no cervical lymphadenopathy Lungs- Clear to ausculation bilaterally, normal work of breathing Heart- Regular rate and rhythm, no murmurs, rubs or gallops, PMI not laterally displaced GI- soft, NT, ND, + BS Extremities- no clubbing, cyanosis, or edema Neuro- strength and sensation are intact  ekg today reveals sinus rhythm 56 bpm, PR 182, QRS 130, Qtc 447, RBBB  Assessment and Plan:

## 2012-06-17 NOTE — Assessment & Plan Note (Signed)
Weight loss is again advised today

## 2012-06-18 ENCOUNTER — Telehealth: Payer: Self-pay | Admitting: Internal Medicine

## 2012-06-18 ENCOUNTER — Other Ambulatory Visit: Payer: Self-pay | Admitting: Physician Assistant

## 2012-07-02 ENCOUNTER — Ambulatory Visit (INDEPENDENT_AMBULATORY_CARE_PROVIDER_SITE_OTHER): Payer: 59 | Admitting: Family Medicine

## 2012-07-02 VITALS — BP 106/71 | HR 61 | Temp 98.1°F | Resp 18 | Ht 61.0 in | Wt 228.0 lb

## 2012-07-02 DIAGNOSIS — J45909 Unspecified asthma, uncomplicated: Secondary | ICD-10-CM

## 2012-07-02 DIAGNOSIS — B078 Other viral warts: Secondary | ICD-10-CM

## 2012-07-02 DIAGNOSIS — M159 Polyosteoarthritis, unspecified: Secondary | ICD-10-CM

## 2012-07-02 MED ORDER — ALBUTEROL SULFATE HFA 108 (90 BASE) MCG/ACT IN AERS: 2.0000 | INHALATION_SPRAY | Freq: Four times a day (QID) | RESPIRATORY_TRACT | Status: DC | PRN

## 2012-07-02 MED ORDER — CELECOXIB 200 MG PO CAPS: 200.0000 mg | ORAL_CAPSULE | Freq: Every day | ORAL | Status: DC

## 2012-07-02 NOTE — Progress Notes (Signed)
  Subjective:    Patient ID: Angela Cummings, female    DOB: Apr 03, 1956, 57 y.o.   MRN: [id number]  HPI Angela Cummings is a 57 y.o. female Hx of Afib - recent cardiology visit - changed from flecainide to Rythmol.   Here for med refill on celebrex. Takes once per day for arthritis. Osteoarthritis "all over" and fibromyalgia. Has discussed chronic use of this with cardiologist and aware of risks.  Hips and knees - chronic pain .   Asthma - uses albuterol as rescue inhaler only - no daily meds.  Uses when has cold only - about 2 times per month. Only notes a few seconds of heart racing on this - not sustained. Cardiologist aware of this as well.  ? Wart on L middle finger for past 8 months. Tried compound W, seems to improve temporarily, then comes back. Increased in size.  Sore.   Review of Systems  Constitutional: Negative for fever and chills.  Respiratory: Positive for wheezing (rare. not currently. ). Negative for shortness of breath.   Musculoskeletal: Positive for arthralgias.  Skin:       Wart.  Hematological: Bruises/bleeds easily.       Objective:   Physical Exam  Vitals reviewed. Constitutional: She is oriented to person, place, and time. She appears well-developed and well-nourished. No distress.  Pulmonary/Chest: No respiratory distress. She has no wheezes.  Musculoskeletal:       Right hip: She exhibits normal range of motion and no bony tenderness.       Left hip: She exhibits normal range of motion and no bony tenderness.       Right knee: She exhibits normal range of motion and no effusion.       Left knee: She exhibits normal range of motion and no effusion.  Neurological: She is alert and oriented to person, place, and time.  Skin: Skin is warm and dry. No rash noted.       Radial aspect of L 3rd nail fold with 66mmx3mm verrucal lesion with ttp. No surrounding erythema.    Risks (including but not limited to bleeding and infection, damage to surrounding normal  tissues), benefits, and alternatives discussed for cryodestruction of L 3rd finger wart .  Verbal consent obtained after any questions were answered. Freeze thaw cycle x 3 with liquid nitrogen and initial ear speculum used for focused delivery.   RTC precautions discussed      Assessment & Plan:  LINCY BELLES is a 57 y.o. female 1. Osteoarthritis of multiple joints  celecoxib (CELEBREX) 200 MG capsule  2. Asthma  albuterol (PROVENTIL HFA;VENTOLIN HFA) 108 (90 BASE) MCG/ACT inhaler refilled x1 with 1 rf. Recheck in next 6 months.  Mild intermittent - controlled overall.   3. Common wart  cryodestruction as above.rtc precautions.    Patient Instructions  Your wart was frozen with liquid nitrogen.  It will likely blister, so keep clean and covered.  Antibiotic ointment as needed. If any increased redness, pain, or pus drainage - recheck.  If any recurnece of the wart, or not completely resolved in next few weeks - recheck.  You will need to establish an office visit in next 6 months to discuss your medicines.  Return to the clinic or go to the nearest emergency room if any of your symptoms worsen or new symptoms occur.

## 2012-07-02 NOTE — Patient Instructions (Signed)
Your wart was frozen with liquid nitrogen.  It will likely blister, so keep clean and covered.  Antibiotic ointment as needed. If any increased redness, pain, or pus drainage - recheck.  If any recurnece of the wart, or not completely resolved in next few weeks - recheck.  You will need to establish an office visit in next 6 months to discuss your medicines.  Return to the clinic or go to the nearest emergency room if any of your symptoms worsen or new symptoms occur.

## 2012-07-25 ENCOUNTER — Emergency Department (HOSPITAL_COMMUNITY): Payer: 59

## 2012-07-25 ENCOUNTER — Observation Stay (HOSPITAL_COMMUNITY)
Admission: EM | Admit: 2012-07-25 | Discharge: 2012-07-26 | Disposition: A | Discharge status: Home / Self Care | Payer: 59 | Attending: Internal Medicine | Admitting: Internal Medicine

## 2012-07-25 DIAGNOSIS — G4733 Obstructive sleep apnea (adult) (pediatric): Secondary | ICD-10-CM | POA: Insufficient documentation

## 2012-07-25 DIAGNOSIS — E876 Hypokalemia: Secondary | ICD-10-CM

## 2012-07-25 DIAGNOSIS — R079 Chest pain, unspecified: Secondary | ICD-10-CM | POA: Insufficient documentation

## 2012-07-25 DIAGNOSIS — J45909 Unspecified asthma, uncomplicated: Secondary | ICD-10-CM | POA: Insufficient documentation

## 2012-07-25 DIAGNOSIS — M1990 Unspecified osteoarthritis, unspecified site: Secondary | ICD-10-CM | POA: Insufficient documentation

## 2012-07-25 DIAGNOSIS — I4891 Unspecified atrial fibrillation: Principal | ICD-10-CM | POA: Insufficient documentation

## 2012-07-25 DIAGNOSIS — I4892 Unspecified atrial flutter: Secondary | ICD-10-CM | POA: Insufficient documentation

## 2012-07-25 LAB — APTT: aPTT: 33 seconds (ref 24–37)

## 2012-07-25 NOTE — ED Notes (Addendum)
Woke up at 2230 with cp. Rt. Arm and rt jaw pain, sob, nausea. Very pale upon arrival. EMS: 12 lead ecg: RVR 180's. Called EDP to give 20 mg ivp cardizem. Pt. Heart rate is now in the 60's and cp free. 324 mg of asa.

## 2012-07-25 NOTE — ED Provider Notes (Addendum)
History     CSN: [id number]  Arrival date & time 07/25/12  2324   First MD Initiated Contact with Patient 07/25/12 2329      Chief Complaint  Patient presents with  . Atrial Fibrillation    (Consider location/radiation/quality/duration/timing/severity/associated sxs/prior treatment) Patient is a 57 y.o. female presenting with atrial fibrillation. The history is provided by the patient.  Atrial Fibrillation This is a recurrent problem. The current episode started 1 to 2 hours ago. The problem occurs constantly. The problem has been resolved. Associated symptoms include chest pain. Nothing relieves the symptoms. She has tried nothing (ems gave 20 of cardize  with resolution of sxs.  was in narrow complex tachycardia prior to administration) for the symptoms.    Past Medical History  Diagnosis Date  . Atrial fibrillation 11/2010    paroxysmal  . Bradycardia   . DJD (degenerative joint disease)   . Obesity   . Mild sleep apnea   . Asthma   . Anxiety   . Depression     Past Surgical History  Procedure Laterality Date  . Cholecystectomy    . Cesarean section    . Knee surgery    . Neck tumor resection      benign  . Hernia repair    . Tee without cardioversion  03/13/2012    Procedure: TRANSESOPHAGEAL ECHOCARDIOGRAM (TEE);  Surgeon: Vesta Mixer, MD;  Location: [facility] ENDOSCOPY;  Service: Cardiovascular;  Laterality: N/A;  Rm 2029  . Cardioversion  03/13/2012    Procedure: CARDIOVERSION;  Surgeon: Vesta Mixer, MD;  Location: [facility] ENDOSCOPY;  Service: Cardiovascular;  Laterality: N/A;    Family History  Problem Relation Age of Onset  . Diabetes Other   . Thyroid disease Other   . Emphysema Mother   . Cancer Father   . Cancer Sister   . Emphysema Brother   . Cancer Daughter     History  Substance Use Topics  . Smoking status: Never Smoker   . Smokeless tobacco: Never Used  . Alcohol Use: No     Comment: Rarely    OB History   Grav Para Term Preterm Abortions  TAB SAB Ect Mult Living                  Review of Systems  Cardiovascular: Positive for chest pain.  All other systems reviewed and are negative.    Allergies  Adhesive  Home Medications   Current Outpatient Rx  Name  Route  Sig  Dispense  Refill  . albuterol (PROVENTIL HFA;VENTOLIN HFA) 108 (90 BASE) MCG/ACT inhaler   Inhalation   Inhale 2 puffs into the lungs every 6 (six) hours as needed. For shortness of breath   1 Inhaler   1   . aspirin 81 MG tablet   Oral   Take 81 mg by mouth daily.           . celecoxib (CELEBREX) 200 MG capsule   Oral   Take 1 capsule (200 mg total) by mouth daily.   30 capsule   5   . Multiple Vitamin (MULTIVITAMIN WITH MINERALS) TABS   Oral   Take 1 tablet by mouth daily.         . nadolol (CORGARD) 20 MG tablet   Oral   Take 10 mg by mouth daily.         . propafenone (RYTHMOL SR) 225 MG 12 hr capsule   Oral   Take 1 capsule (225 mg  total) by mouth 2 (two) times daily.   60 capsule   6     SpO2 98%  Physical Exam  Constitutional: She is oriented to person, place, and time. She appears well-developed and well-nourished.  HENT:  Head: Normocephalic and atraumatic.  Eyes: Conjunctivae and EOM are normal. Pupils are equal, round, and reactive to light.  Neck: Normal range of motion.  Cardiovascular: Normal rate and normal heart sounds.  An irregularly irregular rhythm present.  Pulmonary/Chest: Effort normal and breath sounds normal.  Abdominal: Soft. Bowel sounds are normal.  Musculoskeletal: Normal range of motion.  Neurological: She is alert and oriented to person, place, and time.  Skin: Skin is warm and dry.  Psychiatric: She has a normal mood and affect. Her behavior is normal.    ED Course  Procedures (including critical care time)  Labs Reviewed - No data to display No results found.   No diagnosis found.   Date: 07/25/2012  Rate: 63  Rhythm: atrial fibrillation  QRS Axis: normal  Intervals:  afib  ST/T Wave abnormalities: nonspecific ST changes  Conduction Disutrbances:none  Narrative Interpretation:   Old EKG Reviewed: unchanged   MDM  + afib with rvr.  Terminated with cardizem.  WIll lab reassess  Discussed with [place].  Will admit      Rosanne Ashing, MD 07/25/12 2344  Cicley Ganesh Lytle Michaels, MD 07/26/12 1610  Rosanne Ashing, MD 07/26/12 9604

## 2012-07-26 ENCOUNTER — Observation Stay (HOSPITAL_COMMUNITY): Payer: 59 | Admitting: *Deleted

## 2012-07-26 ENCOUNTER — Encounter (HOSPITAL_COMMUNITY): Payer: Self-pay

## 2012-07-26 ENCOUNTER — Encounter (HOSPITAL_COMMUNITY): Payer: Self-pay | Admitting: *Deleted

## 2012-07-26 ENCOUNTER — Encounter (HOSPITAL_COMMUNITY): Admission: EM | Disposition: A | Discharge status: Home / Self Care | Payer: Self-pay | Source: Home / Self Care

## 2012-07-26 DIAGNOSIS — I4891 Unspecified atrial fibrillation: Secondary | ICD-10-CM

## 2012-07-26 DIAGNOSIS — E876 Hypokalemia: Secondary | ICD-10-CM

## 2012-07-26 HISTORY — PX: CARDIOVERSION: SHX1299

## 2012-07-26 LAB — COMPREHENSIVE METABOLIC PANEL WITH GFR
ALT: 15 U/L (ref 0–35)
AST: 20 U/L (ref 0–37)
Albumin: 3.4 g/dL — ABNORMAL LOW (ref 3.5–5.2)
Alkaline Phosphatase: 171 U/L — ABNORMAL HIGH (ref 39–117)
BUN: 14 mg/dL (ref 6–23)
CO2: 24 meq/L (ref 19–32)
Calcium: 8.9 mg/dL (ref 8.4–10.5)
Chloride: 107 meq/L (ref 96–112)
Creatinine, Ser: 0.78 mg/dL (ref 0.50–1.10)
GFR calc Af Amer: >90 mL/min
GFR calc non Af Amer: 90 mL/min — ABNORMAL LOW
Glucose, Bld: 119 mg/dL — ABNORMAL HIGH (ref 70–99)
Potassium: 3.3 meq/L — ABNORMAL LOW (ref 3.5–5.1)
Sodium: 141 meq/L (ref 135–145)
Total Bilirubin: 0.2 mg/dL — ABNORMAL LOW (ref 0.3–1.2)
Total Protein: 6.9 g/dL (ref 6.0–8.3)

## 2012-07-26 LAB — URINE MICROSCOPIC-ADD ON

## 2012-07-26 LAB — URINALYSIS, ROUTINE W REFLEX MICROSCOPIC
Bilirubin Urine: NEGATIVE
Glucose, UA: NEGATIVE mg/dL
Ketones, ur: NEGATIVE mg/dL
Leukocytes, UA: NEGATIVE
Nitrite: NEGATIVE
Protein, ur: NEGATIVE mg/dL
Specific Gravity, Urine: 1.01 (ref 1.005–1.030)
Urobilinogen, UA: 1 mg/dL (ref 0.0–1.0)
pH: 6 (ref 5.0–8.0)

## 2012-07-26 LAB — CBC
HCT: 37.5 % (ref 36.0–46.0)
Hemoglobin: 11.5 g/dL — ABNORMAL LOW (ref 12.0–15.0)
Hemoglobin: 12.4 g/dL (ref 12.0–15.0)
MCH: 28.8 pg (ref 26.0–34.0)
MCH: 28.9 pg (ref 26.0–34.0)
MCHC: 33.1 g/dL (ref 30.0–36.0)
MCV: 87.4 fL (ref 78.0–100.0)
Platelets: 224 K/uL (ref 150–400)
RBC: 3.99 MIL/uL (ref 3.87–5.11)
RBC: 4.29 MIL/uL (ref 3.87–5.11)
RDW: 14.3 % (ref 11.5–15.5)
WBC: 7.9 K/uL (ref 4.0–10.5)

## 2012-07-26 LAB — CREATININE, SERUM
Creatinine, Ser: 0.68 mg/dL (ref 0.50–1.10)
GFR calc non Af Amer: >90 mL/min (ref >90)

## 2012-07-26 LAB — GLUCOSE, CAPILLARY: Glucose-Capillary: 75 mg/dL (ref 70–99)

## 2012-07-26 LAB — HEMOGLOBIN A1C
Hgb A1c MFr Bld: 5.8 % — ABNORMAL HIGH (ref <5.7)
Mean Plasma Glucose: 120 mg/dL — ABNORMAL HIGH (ref <117)

## 2012-07-26 SURGERY — CARDIOVERSION
Anesthesia: Monitor Anesthesia Care | Wound class: Clean

## 2012-07-26 MED ORDER — SODIUM CHLORIDE 0.9 % IV SOLN
INTRAVENOUS | Status: DC
  Administered 2012-07-26: 05:00:00 via INTRAVENOUS

## 2012-07-26 MED ORDER — HEPARIN SODIUM (PORCINE) 5000 UNIT/ML IJ SOLN
5000.0000 [IU] | Freq: Three times a day (TID) | INTRAMUSCULAR | Status: DC
  Filled 2012-07-26 (×4): qty 1

## 2012-07-26 MED ORDER — SODIUM CHLORIDE 0.9 % IV SOLN: 250.0000 mL | INTRAVENOUS | Status: DC | PRN

## 2012-07-26 MED ORDER — PROPAFENONE HCL ER 225 MG PO CP12
225.0000 mg | ORAL_CAPSULE | Freq: Two times a day (BID) | ORAL | Status: DC
  Administered 2012-07-26: 225 mg via ORAL
  Filled 2012-07-26 (×2): qty 1

## 2012-07-26 MED ORDER — ADULT MULTIVITAMIN W/MINERALS CH
1.0000 | ORAL_TABLET | Freq: Every day | ORAL | Status: DC
  Filled 2012-07-26: qty 1

## 2012-07-26 MED ORDER — SODIUM CHLORIDE 0.9 % IJ SOLN: 3.0000 mL | INTRAMUSCULAR | Status: DC | PRN

## 2012-07-26 MED ORDER — SODIUM CHLORIDE 0.9 % IJ SOLN: 3.0000 mL | Freq: Two times a day (BID) | INTRAMUSCULAR | Status: DC

## 2012-07-26 MED ORDER — ALBUTEROL SULFATE HFA 108 (90 BASE) MCG/ACT IN AERS: 2.0000 | INHALATION_SPRAY | Freq: Four times a day (QID) | RESPIRATORY_TRACT | Status: DC | PRN

## 2012-07-26 MED ORDER — POTASSIUM CHLORIDE CRYS ER 20 MEQ PO TBCR: 40.0000 meq | EXTENDED_RELEASE_TABLET | Freq: Once | ORAL | Status: DC

## 2012-07-26 MED ORDER — PROPAFENONE HCL 225 MG PO TABS
225.0000 mg | ORAL_TABLET | ORAL | Status: AC
  Administered 2012-07-26: 225 mg via ORAL
  Filled 2012-07-26: qty 1

## 2012-07-26 MED ORDER — ASPIRIN 81 MG PO CHEW
81.0000 mg | CHEWABLE_TABLET | Freq: Every day | ORAL | Status: DC
  Administered 2012-07-26: 81 mg via ORAL
  Filled 2012-07-26: qty 1

## 2012-07-26 MED ORDER — PROPOFOL 10 MG/ML IV BOLUS
INTRAVENOUS | Status: DC | PRN
  Administered 2012-07-26: 100 mg via INTRAVENOUS

## 2012-07-26 MED ORDER — ONDANSETRON HCL 4 MG/2ML IJ SOLN: 4.0000 mg | Freq: Four times a day (QID) | INTRAMUSCULAR | Status: DC | PRN

## 2012-07-26 MED ORDER — DILTIAZEM HCL 25 MG/5ML IV SOLN
15.0000 mg | Freq: Once | INTRAVENOUS | Status: AC
  Administered 2012-07-26: 02:00:00 via INTRAVENOUS
  Filled 2012-07-26: qty 5

## 2012-07-26 MED ORDER — NADOLOL 20 MG PO TABS
20.0000 mg | ORAL_TABLET | Freq: Every day | ORAL | Status: DC
  Filled 2012-07-26: qty 1

## 2012-07-26 MED ORDER — DILTIAZEM HCL 100 MG IV SOLR
5.0000 mg/h | INTRAVENOUS | Status: AC
  Filled 2012-07-26: qty 100

## 2012-07-26 MED ORDER — SODIUM CHLORIDE 0.9 % IV SOLN: 250.0000 mL | INTRAVENOUS | Status: DC

## 2012-07-26 MED ORDER — ACETAMINOPHEN 325 MG PO TABS
650.0000 mg | ORAL_TABLET | ORAL | Status: DC | PRN
  Administered 2012-07-26: 650 mg via ORAL
  Filled 2012-07-26: qty 2

## 2012-07-26 MED ORDER — DILTIAZEM HCL 100 MG IV SOLR
5.0000 mg/h | Freq: Once | INTRAVENOUS | Status: AC
  Administered 2012-07-26: 5 mg/h via INTRAVENOUS
  Filled 2012-07-26: qty 100

## 2012-07-26 NOTE — Preoperative (Signed)
Beta Blockers   Reason not to administer Beta Blockers:Not Applicable 

## 2012-07-26 NOTE — Transfer of Care (Signed)
Immediate Anesthesia Transfer of Care Note  Patient: Angela Cummings  Procedure(s) Performed: Procedure(s): CARDIOVERSION (N/A)  Patient Location: rm 2010  Anesthesia Type:General  Level of Consciousness: awake, alert  and oriented  Airway & Oxygen Therapy: Patient Spontanous Breathing and Patient connected to nasal cannula oxygen  Post-op Assessment: Report given to PACU RN and Post -op Vital signs reviewed and stable  Post vital signs: Reviewed and stable  Complications: No apparent anesthesia complications

## 2012-07-26 NOTE — Care Management Note (Unsigned)
    Page 1 of 1   07/26/2012     3:51:41 PM   CARE MANAGEMENT NOTE 07/26/2012  Patient:  Angela Cummings, Angela Cummings   Account Number:  [id number]  Date Initiated:  07/26/2012  Documentation initiated by:  Saman Giddens  Subjective/Objective Assessment:   PT ADM ON 07/25/12 WITH AFIB WITH RVR.  PTA, PT INDEPENDENT, LIVES WITH SPOUSE.     Action/Plan:   WILL FOLLOW FOR HOME NEEDS AS PT PROGRESSES.  CARDIOVERSION TODAY.   Anticipated DC Date:  07/26/2012   Anticipated DC Plan:  HOME/SELF CARE      DC Planning Services  CM consult      Choice offered to / List presented to:             Status of service:  In process, will continue to follow Medicare Important Message given?   (If response is "NO", the following Medicare IM given date fields will be blank) Date Medicare IM given:   Date Additional Medicare IM given:    Discharge Disposition:    Per UR Regulation:  Reviewed for med. necessity/level of care/duration of stay  If discussed at Long Length of Stay Meetings, dates discussed:    Comments:

## 2012-07-26 NOTE — Anesthesia Postprocedure Evaluation (Signed)
  Anesthesia Post-op Note  Patient: Angela Cummings  Procedure(s) Performed: Procedure(s): CARDIOVERSION (N/A)  Patient Location: rm2010  Anesthesia Type:General  Level of Consciousness: awake, alert  and oriented  Airway and Oxygen Therapy: Patient Spontanous Breathing and Patient connected to nasal cannula oxygen  Post-op Pain: none  Post-op Assessment: Post-op Vital signs reviewed, Patient's Cardiovascular Status Stable, Respiratory Function Stable, Patent Airway, No signs of Nausea or vomiting and Pain level controlled  Post-op Vital Signs: Reviewed  Complications: No apparent anesthesia complications

## 2012-07-26 NOTE — Progress Notes (Signed)
Discharged to home with family office visits in place teaching done  

## 2012-07-26 NOTE — Progress Notes (Signed)
UR Completed.  Orrin Yurkovich Jane [phone] 07/26/2012  

## 2012-07-26 NOTE — Op Note (Signed)
EP procedure Note   Pre procedure Diagnosis:  Persistent Atrial fibrillation Post procedure Diagnosis:  Same  Procedures:  Electrical cardioversion  Description:  Informed, written consent was obtained for cardioversion.  Adequate IV acces and airway support were assured.  The patient was adequately sedated with intravenous propofol as outlined in the anesthesia report.  The patient presented today in atrial fibrillation.  She was successfully cardioverted to sinus rhythm with a single synchronized biphasic 200J shock delivered with cardioversion electrodes placed in the anterior/posterior configuration.  She remains in sinus rhythm thereafter.  There were no early apparent complications.  Conclusions:  1.  Successful cardioversion of afib to sinus rhythm 2.  No early apparent complications.   Pasha Broad,MD 2:03 PM 07/26/2012

## 2012-07-26 NOTE — Anesthesia Preprocedure Evaluation (Addendum)
Anesthesia Evaluation  Patient identified by MRN, date of birth, ID band Patient awake    Reviewed: Allergy & Precautions, H&P , NPO status , Patient's Chart, lab work & pertinent test results  History of Anesthesia Complications Negative for: history of anesthetic complications  Airway Mallampati: II TM Distance: >3 FB Neck ROM: Full    Dental  (+) Dental Advisory Given   Pulmonary asthma , sleep apnea ,          Cardiovascular     Neuro/Psych    GI/Hepatic negative GI ROS, Neg liver ROS,   Endo/Other  negative endocrine ROS  Renal/GU negative Renal ROS  negative genitourinary   Musculoskeletal  (+) Arthritis -, Osteoarthritis,    Abdominal   Peds  Hematology negative hematology ROS (+)   Anesthesia Other Findings   Reproductive/Obstetrics                           Anesthesia Physical Anesthesia Plan  ASA: III  Anesthesia Plan: General   Post-op Pain Management:    Induction: Intravenous  Airway Management Planned: Mask  Additional Equipment:   Intra-op Plan:   Post-operative Plan:   Informed Consent: I have reviewed the patients History and Physical, chart, labs and discussed the procedure including the risks, benefits and alternatives for the proposed anesthesia with the patient or authorized representative who has indicated his/her understanding and acceptance.     Plan Discussed with: CRNA and Surgeon  Anesthesia Plan Comments:        Anesthesia Quick Evaluation

## 2012-07-26 NOTE — Progress Notes (Signed)
ELECTROPHYSIOLOGY ROUNDING NOTE    Patient Name: Angela Cummings Date of Encounter: 07-26-2012    SUBJECTIVE:Patient feels better.  Chest pain resolved.  Admitted yesterday evening with afib with RVR. Pt states it awoke her from her sleep last night around 10:30PM.  She is clear that she was in normal rhythm yesterday. She was put on a Cardizem gtt and given an extra Rhythmol in the ER, both of which have failed to restore sinus rhythm, but have controlled her ventricular response.  EKG this morning appears to be an atypical atrial flutter with controlled ventricular rate.   Lab data has been notable for K of 3.3, Mg of 1.8. Troponin has been negative.   TELEMETRY: Reviewed telemetry pt in atypical flutter with controlled ventricular response  Physical Exam: Filed Vitals:   07/26/12 0315 07/26/12 0330 07/26/12 0400 07/26/12 0500  BP: 106/54 103/63 95/66   Pulse: 79 79 81   Temp:   97.5 F (36.4 C)   TempSrc:      Resp: 15 11 16    Height:    5\' 1"  (1.549 m)  Weight:   230 lb 9.6 oz (104.6 kg)   SpO2: 98% 99% 95%     GEN- The patient is well appearing, alert and oriented x 3 today.   Head- normocephalic, atraumatic Eyes-  Sclera clear, conjunctiva pink Ears- hearing intact Oropharynx- clear Neck- supple, no JVP Lymph- no cervical lymphadenopathy Lungs- Clear to ausculation bilaterally, normal work of breathing Heart- Regular rate and rhythm, no murmurs, rubs or gallops, PMI not laterally displaced GI- soft, NT, ND, + BS Extremities- no clubbing, cyanosis, or edema MS- no significant deformity or atrophy Skin- no rash or lesion Psych- euthymic mood, full affect Neuro- strength and sensation are intact  LABS: Basic Metabolic Panel:  Recent Labs  41/32/44 2331 07/26/12 0720  NA 141  --   K 3.3*  --   CL 107  --   CO2 24  --   GLUCOSE 119*  --   BUN 14  --   CREATININE 0.78 0.68  CALCIUM 8.9  --   MG  --  1.8   Liver Function Tests:  Recent Labs   07/25/12 2331  AST 20  ALT 15  ALKPHOS 171*  BILITOT 0.2*  PROT 6.9  ALBUMIN 3.4*   CBC:  Recent Labs  07/25/12 2331 07/26/12 0720  WBC 7.9 6.8  HGB 12.4 11.5*  HCT 37.5 34.6*  MCV 87.4 86.7  PLT 224 200   Cardiac Enzymes:  Recent Labs  07/26/12 0720  TROPONINI <0.30    Radiology/Studies:  Dg Chest Port 1 View 07/26/2012  *RADIOLOGY REPORT*  Clinical Data: Shortness of breath.  Chest pain.  Palpitations.  PORTABLE CHEST - 1 VIEW  Comparison: 03/09/2012  Findings: Mildly enlarged cardiopericardial silhouette noted with indistinct pulmonary vasculature and mild interstitial accentuation.  Calcified granuloma noted in the right lower lobe, stable.  Tortuosity of the thoracic aorta is present.  IMPRESSION:  1.  Cardiomegaly, with pulmonary vascular indistinctness and mild interstitial accentuation favoring interstitial edema.  Atypical pneumonia is a less likely differential diagnostic consideration.   Original Report Authenticated By: Gaylyn Rong, M.D.      Assessment and Plan:  1. afib Pt with initially an atypical slow atrial flutter, now in afib < 48 hours.  Would plan cardioversion with potential discharge later today. Risks, benefits, and alternatives to cardioversion were discussed at length with the patient who wishes to proceed.  Hillis Range.MD

## 2012-07-26 NOTE — H&P (Addendum)
Reason for admit: Recurrent PAF Primary cardiologist: Jacinto Halim EP: Allred  HPI:  57 y/o woman with PAF, obesity and mild OSA. First developed AF in 2/12.  She was evaluated by Dr Jacinto Halim and had an echo which revealed LA size 49mm with a preserved EF. She has also had a normal stress test in his office. Was initially started on metoprolol but failed due to bradycardia with HR in 40s.   Was then referred to Dr. Johney Frame who started flecainide and nadolol which worked very well. However, in January Flecainide switched to Rhythmol due to dizziness. With CHADSVaSc score of 1  (CHADS2 = 0) has been on ASA and not systemic anticoagulation. Last episode of AF was in September 2013.   Had been doing well until last night when she developed onset of CP which is her symptom associated with AF. Came to be in ER and found to have recurrent AF with RVR. Given a single dose of diltiazem by ER attending with good rate control. ECG showed what looked like probable AFL vs A tach with controlled ventricular response.   I was called and asked them to give an extra dose of extra dose of Rhythmol 225mg  (patient already had her 2 regular doses for the day) which did not convert her. Her rate increased again so she was started on diltiazem drip and admitted for observation and possible cardioversion. Now CP free. Troponin negative.     Review of Systems:     Cardiac Review of Systems: {Y] = yes [ ]  = no  Chest Pain [ y   ]  Resting SOB [   ] Exertional SOB  [  ]  Orthopnea [  ]   Pedal Edema [   ]    Palpitations [ y ] Syncope  [  ]   Presyncope [   ]  General Review of Systems: [Y] = yes [  ]=no Constitional: recent weight change [  ]; anorexia [  ]; fatigue [place]  ]; nausea [  ]; night sweats [  ]; fever [  ]; or chills [  ];                                                                                                                                           Eye : blurred vision [  ]; diplopia [   ]; vision changes  [  ];  Amaurosis fugax[  ]; Resp: cough [  ];  wheezing[  ];  hemoptysis[  ]; shortness of breath[  ]; paroxysmal nocturnal dyspnea[  ]; dyspnea on exertion[  ]; or orthopnea[  ];  GI:  gallstones[  ], vomiting[  ];  dysphagia[  ]; melena[  ];  hematochezia [  ]; heartburn[  ];   Hx of  Colonoscopy[  ]; GU: kidney stones [  ]; hematuria[  ];   dysuria [  ];  nocturia[  ];  history of     obstruction [  ];                 Skin: rash, swelling[  ];, hair loss[  ];  peripheral edema[  ];  or itching[  ]; Musculosketetal: myalgias[  ];  joint swelling[  ];  joint erythema[  ];  joint pain[y  ];  back pain[  ];  Heme/Lymph: bruising[  ];  bleeding[  ];  anemia[  ];  Neuro: TIA[  ];  headaches[  ];  stroke[  ];  vertigo[  ];  seizures[  ];   paresthesias[  ];  difficulty walking[  ];  Psych:depression[  ]; anxiety[  ];  Endocrine: diabetes[  ];  thyroid dysfunction[  ];  Other:  Past Medical History  Diagnosis Date  . Atrial fibrillation 11/2010    paroxysmal  . Bradycardia   . DJD (degenerative joint disease)   . Obesity   . Mild sleep apnea   . Asthma   . Anxiety   . Depression     Medications Prior to Admission  Medication Sig Dispense Refill  . albuterol (PROVENTIL HFA;VENTOLIN HFA) 108 (90 BASE) MCG/ACT inhaler Inhale 2 puffs into the lungs every 6 (six) hours as needed. For shortness of breath  1 Inhaler  1  . aspirin 81 MG tablet Take 81 mg by mouth daily.        . celecoxib (CELEBREX) 200 MG capsule Take 1 capsule (200 mg total) by mouth daily.  30 capsule  5  . Multiple Vitamin (MULTIVITAMIN WITH MINERALS) TABS Take 1 tablet by mouth daily.      . nadolol (CORGARD) 20 MG tablet Take 10 mg by mouth daily.      . propafenone (RYTHMOL SR) 225 MG 12 hr capsule Take 1 capsule (225 mg total) by mouth 2 (two) times daily.  60 capsule  6     Allergies  Allergen Reactions  . Adhesive (Tape) Itching and Rash    History   Social History  . Marital Status: Married    Spouse Name:  N/A    Number of Children: 3  . Years of Education: N/A   Occupational History  . Not on file.   Social History Main Topics  . Smoking status: Never Smoker   . Smokeless tobacco: Never Used  . Alcohol Use: No     Comment: Rarely  . Drug Use: No  . Sexually Active: Yes   Other Topics Concern  . Not on file   Social History Narrative   Married with 3 children (17,30, 53) and granddaughter (14).   Lives in [place]. Unemployed.    Family History  Problem Relation Age of Onset  . Diabetes Other   . Thyroid disease Other   . Emphysema Mother   . Cancer Father   . Cancer Sister   . Emphysema Brother   . Cancer Daughter     PHYSICAL EXAM: Filed Vitals:   07/26/12 0400  BP: 95/66  Pulse: 81  Temp: 97.5 F (36.4 C)  Resp: 16   General:  Well appearing. No respiratory difficulty HEENT: normal Neck: supple. no JVD. Carotids 2+ bilat; no bruits. No lymphadenopathy or thryomegaly appreciated. Cor: PMI nondisplaced. Regular rate & rhythm. No rubs, gallops or murmurs. Lungs: clear Abdomen: obese soft, nontender, nondistended.No bruits or masses. Good bowel sounds. Extremities: no cyanosis, clubbing, rash, edema Neuro: alert & oriented x 3, cranial nerves grossly intact. moves all  4 extremities w/o difficulty. Affect pleasant.  Initial ECG: Aflutter vs atach with variable conduction V-rate 63. +RBBB  Results for orders placed during the hospital encounter of 07/25/12 (from the past 24 hour(s))  CBC     Status: None   Collection Time    07/25/12 11:31 PM      Result Value Range   WBC 7.9  4.0 - 10.5 K/uL   RBC 4.29  3.87 - 5.11 MIL/uL   Hemoglobin 12.4  12.0 - 15.0 g/dL   HCT 84.1  32.4 - 40.1 %   MCV 87.4  78.0 - 100.0 fL   MCH 28.9  26.0 - 34.0 pg   MCHC 33.1  30.0 - 36.0 g/dL   RDW 02.7  25.3 - 66.4 %   Platelets 224  150 - 400 K/uL  COMPREHENSIVE METABOLIC PANEL     Status: Abnormal   Collection Time    07/25/12 11:31 PM      Result Value Range   Sodium  141  135 - 145 mEq/L   Potassium 3.3 (*) 3.5 - 5.1 mEq/L   Chloride 107  96 - 112 mEq/L   CO2 24  19 - 32 mEq/L   Glucose, Bld 119 (*) 70 - 99 mg/dL   BUN 14  6 - 23 mg/dL   Creatinine, Ser 4.03  0.50 - 1.10 mg/dL   Calcium 8.9  8.4 - 47.4 mg/dL   Total Protein 6.9  6.0 - 8.3 g/dL   Albumin 3.4 (*) 3.5 - 5.2 g/dL   AST 20  0 - 37 U/L   ALT 15  0 - 35 U/L   Alkaline Phosphatase 171 (*) 39 - 117 U/L   Total Bilirubin 0.2 (*) 0.3 - 1.2 mg/dL   GFR calc non Af Amer 90 (*) >90 mL/min   GFR calc Af Amer >90  >90 mL/min  APTT     Status: None   Collection Time    07/25/12 11:31 PM      Result Value Range   aPTT 33  24 - 37 seconds  PROTIME-INR     Status: None   Collection Time    07/25/12 11:31 PM      Result Value Range   Prothrombin Time 13.6  11.6 - 15.2 seconds   INR 1.05  0.00 - 1.49  POCT I-STAT TROPONIN I     Status: None   Collection Time    07/25/12 11:40 PM      Result Value Range   Troponin i, poc 0.00  0.00 - 0.08 ng/mL   Comment 3           URINALYSIS, ROUTINE W REFLEX MICROSCOPIC     Status: Abnormal   Collection Time    07/26/12  1:24 AM      Result Value Range   Color, Urine YELLOW  YELLOW   APPearance CLEAR  CLEAR   Specific Gravity, Urine 1.010  1.005 - 1.030   pH 6.0  5.0 - 8.0   Glucose, UA NEGATIVE  NEGATIVE mg/dL   Hgb urine dipstick SMALL (*) NEGATIVE   Bilirubin Urine NEGATIVE  NEGATIVE   Ketones, ur NEGATIVE  NEGATIVE mg/dL   Protein, ur NEGATIVE  NEGATIVE mg/dL   Urobilinogen, UA 1.0  0.0 - 1.0 mg/dL   Nitrite NEGATIVE  NEGATIVE   Leukocytes, UA NEGATIVE  NEGATIVE  URINE MICROSCOPIC-ADD ON     Status: None   Collection Time    07/26/12  1:24 AM  Result Value Range   Squamous Epithelial / LPF RARE  RARE   WBC, UA 0-2  <3 WBC/hpf   RBC / HPF 3-6  <3 RBC/hpf   Bacteria, UA RARE  RARE   Dg Chest Port 1 View  07/26/2012  *RADIOLOGY REPORT*  Clinical Data: Shortness of breath.  Chest pain.  Palpitations.  PORTABLE CHEST - 1 VIEW   Comparison: 03/09/2012  Findings: Mildly enlarged cardiopericardial silhouette noted with indistinct pulmonary vasculature and mild interstitial accentuation.  Calcified granuloma noted in the right lower lobe, stable.  Tortuosity of the thoracic aorta is present.  IMPRESSION:  1.  Cardiomegaly, with pulmonary vascular indistinctness and mild interstitial accentuation favoring interstitial edema.  Atypical pneumonia is a less likely differential diagnostic consideration.   Original Report Authenticated By: Gaylyn Rong, M.D.      ASSESSMENT:  1. Recurrent PAF (vs flutter) on propafenone     --previously on flecainide but switched due to question of dizziness     --intolerant of metoprolol due to bradycardia (40s)     --CHADSVaSc = 1, CHADS2 = 0 2. Morbid obesity 3. Mild OSA 4. Hypokalemia 5. CP, likely due to AF - Troponin negative. Previous stress test normal.   PLAN/DISCUSSION:  She appears to be still in AF (vs AFL) Will review with EP. Keep NPO. May need DC-CV this am. Continue Rhythmol/diltiazem. Hold Nadolol. Supp K+.  Nancee Brownrigg,MD 6:23 AM

## 2012-07-26 NOTE — Progress Notes (Addendum)
Late entry:  Patient showing NSR at  0359 am per monitor but still appears afib

## 2012-07-26 NOTE — Discharge Summary (Signed)
ELECTROPHYSIOLOGY PROCEDURE DISCHARGE SUMMARY    Patient ID: Angela Cummings,  MRN: [id number], DOB/AGE: 1955-06-27 57 y.o.  Admit date: 07/25/2012 Discharge date: 07/26/2012  Primary Cardiologist: Jacinto Halim Electrophysiologist: Hillis Range, MD  Primary Discharge Diagnosis:  Atrial fibrillation status post cardioversion this admission  Secondary Discharge Diagnosis:  1. Bradycardia 2. Degenerative joint disease 3. Obesity 4. Asthma 5. Sleep apnea  Procedures This Admission: 1. Direct current cardioversion on 07-26-2012 by Dr Johney Frame.  This resulted in restoration of sinus rhythm.  There were no early apparent complications.   Brief HPI: Angela Cummings is a 57 year old female with a history of paroxysmal atrial fibrillation. She has maintained sinus rhythm with prn Rhythmol.  She was in her usual state of health until last night around 10:30PM when she had a sudden onset of tachy-palpitations associated with chest pain.  She called 911 and was brought to [facility] for further evaluation.   Hospital Course:  She was admitted and placed on IV Cardizem.  This resulted in slowing of her ventricular rate and resolution of her chest pain.  The morning of 2-14, she was in an atypical flutter with controlled ventricular response.  Because she had been out of rhythm for <48 hours, the decision was made to cardiovert the patient.  She was cardioverted on 2-14 which resulted in SR.  Dr Johney Frame examined her and considered her stable for discharge to home with outpatient follow up.  She will have no med changes at this time per Dr Johney Frame.   Discharge Vitals: Blood pressure 95/66, pulse 81, temperature 97.5 F (36.4 C), temperature source Oral, resp. rate 16, height 5\' 1"  (1.549 m), weight 230 lb 9.6 oz (104.6 kg), SpO2 95.00%.    Labs:   Lab Results  Component Value Date   WBC 6.8 07/26/2012   HGB 11.5* 07/26/2012   HCT 34.6* 07/26/2012   MCV 86.7 07/26/2012   PLT 200 07/26/2012    Recent Labs Lab  07/25/12 2331 07/26/12 0720  NA 141  --   K 3.3*  --   CL 107  --   CO2 24  --   BUN 14  --   CREATININE 0.78 0.68  CALCIUM 8.9  --   PROT 6.9  --   BILITOT 0.2*  --   ALKPHOS 171*  --   ALT 15  --   AST 20  --   GLUCOSE 119*  --    Lab Results  Component Value Date   CKTOTAL 90 11/13/2010   CKMB 3.0 11/13/2010   TROPONINI <0.30 07/26/2012    Discharge Medications:    Medication List    TAKE these medications       albuterol 108 (90 BASE) MCG/ACT inhaler  Commonly known as:  PROVENTIL HFA;VENTOLIN HFA  Inhale 2 puffs into the lungs every 6 (six) hours as needed. For shortness of breath     aspirin 81 MG tablet  Take 81 mg by mouth daily.     celecoxib 200 MG capsule  Commonly known as:  CELEBREX  Take 1 capsule (200 mg total) by mouth daily.     multivitamin with minerals Tabs  Take 1 tablet by mouth daily.     nadolol 20 MG tablet  Commonly known as:  CORGARD  Take 10 mg by mouth daily.     propafenone 225 MG 12 hr capsule  Commonly known as:  RYTHMOL SR  Take 1 capsule (225 mg total) by mouth 2 (two) times daily.  Disposition:       Future Appointments Provider Department Dept Phone   08/22/2012 10:15 AM Hillis Range, MD [facility] Main Office [place]) [phone]       Duration of Discharge Encounter: Greater than 30 minutes including physician time.   Hillis Range, MD

## 2012-07-31 ENCOUNTER — Encounter (HOSPITAL_COMMUNITY): Payer: Self-pay | Admitting: Internal Medicine

## 2012-08-10 HISTORY — PX: ABLATION: SHX5711

## 2012-08-22 ENCOUNTER — Encounter: Payer: Self-pay | Admitting: Internal Medicine

## 2012-08-22 ENCOUNTER — Ambulatory Visit (INDEPENDENT_AMBULATORY_CARE_PROVIDER_SITE_OTHER): Payer: 59 | Admitting: Internal Medicine

## 2012-08-22 ENCOUNTER — Encounter: Payer: Self-pay | Admitting: *Deleted

## 2012-08-22 VITALS — BP 133/58 | HR 45 | Ht 61.0 in | Wt 234.5 lb

## 2012-08-22 DIAGNOSIS — E669 Obesity, unspecified: Secondary | ICD-10-CM

## 2012-08-22 DIAGNOSIS — I4891 Unspecified atrial fibrillation: Secondary | ICD-10-CM

## 2012-08-22 LAB — CBC WITH DIFFERENTIAL/PLATELET
Basophils Relative: 0.7 % (ref 0.0–3.0)
Eosinophils Absolute: 0.2 10*3/uL (ref 0.0–0.7)
Hemoglobin: 11.4 g/dL — ABNORMAL LOW (ref 12.0–15.0)
Lymphocytes Relative: 31.9 % (ref 12.0–46.0)
MCHC: 32.8 g/dL (ref 30.0–36.0)
MCV: 86 fl (ref 78.0–100.0)
Monocytes Absolute: 0.7 10*3/uL (ref 0.1–1.0)
Neutro Abs: 3.8 10*3/uL (ref 1.4–7.7)
RBC: 4.05 Mil/uL (ref 3.87–5.11)

## 2012-08-22 LAB — BASIC METABOLIC PANEL
CO2: 23 mEq/L (ref 19–32)
Chloride: 107 mEq/L (ref 96–112)
Sodium: 139 mEq/L (ref 135–145)

## 2012-08-22 MED ORDER — RIVAROXABAN 20 MG PO TABS: 20.0000 mg | ORAL_TABLET | Freq: Every day | ORAL | Status: DC

## 2012-08-22 NOTE — Assessment & Plan Note (Signed)
Weight loss is strongly encouraged

## 2012-08-22 NOTE — Addendum Note (Signed)
Addended by: Dennis Bast F on: 08/22/2012 11:29 AM   Modules accepted: Orders

## 2012-08-22 NOTE — Progress Notes (Signed)
PCP:  Tally Due, MD Primary Cardiologist:  Dr Jacinto Halim  The patient presents today for routine electrophysiology followup.  Since last being seen in our clinic, the patient reports doing very well. She had recurrent afib 2/14 and required cardioversion.  Today, she denies symptoms of chest pain, shortness of breath, orthopnea, PND, lower extremity edema, dizziness, presyncope, syncope, or neurologic sequela.  The patient feels that she is tolerating medications without difficulties and is otherwise without complaint today.   Past Medical History  Diagnosis Date  . Atrial fibrillation 11/2010    paroxysmal  . Bradycardia   . DJD (degenerative joint disease)   . Obesity   . Mild sleep apnea   . Asthma   . Anxiety   . Depression    Past Surgical History  Procedure Laterality Date  . Cholecystectomy    . Cesarean section    . Knee surgery    . Neck tumor resection      benign  . Hernia repair    . Tee without cardioversion  03/13/2012    Procedure: TRANSESOPHAGEAL ECHOCARDIOGRAM (TEE);  Surgeon: Vesta Mixer, MD;  Location[facility] ENDOSCOPY;  Service: Cardiovascular;  Laterality: N/A;  Rm 2029  . Cardioversion  03/13/2012    Procedure: CARDIOVERSION;  Surgeon: Vesta Mixer, MD;  Location: [facility] ENDOSCOPY;  Service: Cardiovascular;  Laterality: N/A;  . Cardioversion N/A 07/26/2012    Procedure: CARDIOVERSION;  Surgeon: Hillis Range, MD;  Location: [facility] OR;  Service: Cardiovascular;  Laterality: N/A;    Current Outpatient Prescriptions  Medication Sig Dispense Refill  . albuterol (PROVENTIL HFA;VENTOLIN HFA) 108 (90 BASE) MCG/ACT inhaler Inhale 2 puffs into the lungs every 6 (six) hours as needed. For shortness of breath  1 Inhaler  1  . aspirin 81 MG tablet Take 81 mg by mouth daily.        . celecoxib (CELEBREX) 200 MG capsule Take 1 capsule (200 mg total) by mouth daily.  30 capsule  5  . Multiple Vitamin (MULTIVITAMIN WITH MINERALS) TABS Take 1 tablet by mouth daily.      .  nadolol (CORGARD) 20 MG tablet Take 10 mg by mouth daily.      . propafenone (RYTHMOL SR) 225 MG 12 hr capsule Take 1 capsule (225 mg total) by mouth 2 (two) times daily.  60 capsule  6   No current facility-administered medications for this visit.    Allergies  Allergen Reactions  . Adhesive (Tape) Itching and Rash    History   Social History  . Marital Status: Married    Spouse Name: N/A    Number of Children: 3  . Years of Education: N/A   Occupational History  . Not on file.   Social History Main Topics  . Smoking status: Never Smoker   . Smokeless tobacco: Never Used  . Alcohol Use: No     Comment: Rarely  . Drug Use: No  . Sexually Active: Yes   Other Topics Concern  . Not on file   Social History Narrative   Married with 3 children (17,30, 62) and granddaughter (14).   Lives in [place]. Unemployed.    Family History  Problem Relation Age of Onset  . Diabetes Other   . Thyroid disease Other   . Emphysema Mother   . Cancer Father   . Cancer Sister   . Emphysema Brother   . Cancer Daughter     ROS-  All systems are reviewed and are negative  except as outlined in the HPI above  Physical Exam: Filed Vitals:   08/22/12 1035  BP: 133/58  Pulse: 45  Height: 5\' 1"  (1.549 m)  Weight: 234 lb 8 oz (106.369 kg)    GEN- The patient is well appearing, alert and oriented x 3 today.   Head- normocephalic, atraumatic Eyes-  Sclera clear, conjunctiva pink Ears- hearing intact Oropharynx- clear Neck- supple, no JVP Lymph- no cervical lymphadenopathy Lungs- Clear to ausculation bilaterally, normal work of breathing Heart- Regular rate and rhythm, no murmurs, rubs or gallops, PMI not laterally displaced GI- soft, NT, ND, + BS Extremities- no clubbing, cyanosis, or edema MS- no significant deformity or atrophy Skin- no rash or lesion Psych- euthymic mood, full affect Neuro- strength and sensation are intact  ekg today reveals sinus bradycardia 45 bpm,  RBBB  Assessment and Plan:

## 2012-08-22 NOTE — Assessment & Plan Note (Signed)
Recurrent afib despite medical therapy with flecainide and rhythmol. Therapeutic strategies for afib including medicine and ablation were discussed in detail with the patient today. Risk, benefits, and alternatives to EP study and radiofrequency ablation for afib were also discussed in detail today. These risks include but are not limited to stroke, bleeding, vascular damage, tamponade, perforation, damage to the esophagus, lungs, and other structures, pulmonary vein stenosis, worsening renal function, and death. The patient understands these risk and wishes to proceed.   I will initiate xarelto 20mg  daily today.  We will therefore proceed with catheter ablation once the patient has been adequately anticoagulated.

## 2012-08-22 NOTE — Patient Instructions (Addendum)

## 2012-08-23 ENCOUNTER — Encounter (HOSPITAL_COMMUNITY): Payer: Self-pay | Admitting: Pharmacy Technician

## 2012-08-23 ENCOUNTER — Other Ambulatory Visit: Payer: Self-pay | Admitting: *Deleted

## 2012-09-02 ENCOUNTER — Ambulatory Visit (HOSPITAL_COMMUNITY)
Admission: RE | Admit: 2012-09-02 | Discharge: 2012-09-02 | Disposition: A | Discharge status: Home / Self Care | Payer: 59 | Source: Ambulatory Visit | Attending: Cardiovascular Disease | Admitting: Cardiovascular Disease

## 2012-09-02 ENCOUNTER — Encounter (HOSPITAL_COMMUNITY): Payer: Self-pay | Admitting: *Deleted

## 2012-09-02 ENCOUNTER — Encounter (HOSPITAL_COMMUNITY)
Admission: RE | Disposition: A | Discharge status: Home / Self Care | Payer: Self-pay | Source: Ambulatory Visit | Attending: Cardiovascular Disease

## 2012-09-02 DIAGNOSIS — I4891 Unspecified atrial fibrillation: Secondary | ICD-10-CM

## 2012-09-02 DIAGNOSIS — I059 Rheumatic mitral valve disease, unspecified: Secondary | ICD-10-CM | POA: Insufficient documentation

## 2012-09-02 DIAGNOSIS — I079 Rheumatic tricuspid valve disease, unspecified: Secondary | ICD-10-CM | POA: Insufficient documentation

## 2012-09-02 HISTORY — PX: TEE WITHOUT CARDIOVERSION: SHX5443

## 2012-09-02 SURGERY — ECHOCARDIOGRAM, TRANSESOPHAGEAL
Anesthesia: Moderate Sedation

## 2012-09-02 MED ORDER — MIDAZOLAM HCL 10 MG/2ML IJ SOLN
INTRAMUSCULAR | Status: DC | PRN
  Administered 2012-09-02 (×2): 2 mg via INTRAVENOUS
  Administered 2012-09-02: 1 mg via INTRAVENOUS
  Administered 2012-09-02: 2 mg via INTRAVENOUS

## 2012-09-02 MED ORDER — FENTANYL CITRATE 0.05 MG/ML IJ SOLN
INTRAMUSCULAR | Status: AC
  Filled 2012-09-02: qty 2

## 2012-09-02 MED ORDER — SODIUM CHLORIDE 0.9 % IV SOLN
Freq: Once | INTRAVENOUS | Status: AC
  Administered 2012-09-02: 500 mL via INTRAVENOUS

## 2012-09-02 MED ORDER — SODIUM CHLORIDE 0.9 % IV SOLN: INTRAVENOUS | Status: DC

## 2012-09-02 MED ORDER — BUTAMBEN-TETRACAINE-BENZOCAINE 2-2-14 % EX AERO
INHALATION_SPRAY | CUTANEOUS | Status: DC | PRN
  Administered 2012-09-02: 2 via TOPICAL

## 2012-09-02 MED ORDER — MIDAZOLAM HCL 5 MG/ML IJ SOLN
INTRAMUSCULAR | Status: AC
  Filled 2012-09-02: qty 2

## 2012-09-02 MED ORDER — FENTANYL CITRATE 0.05 MG/ML IJ SOLN
INTRAMUSCULAR | Status: DC | PRN
  Administered 2012-09-02 (×2): 25 ug via INTRAVENOUS

## 2012-09-02 NOTE — Progress Notes (Signed)
  Echocardiogram Echocardiogram Transesophageal has been performed.  Angela Cummings 09/02/2012, 11:46 AM

## 2012-09-02 NOTE — CV Procedure (Signed)
Transesophageal Echocardiogram: Indication: PAF pre ablation Sedation: Versed: 6, Fentanyl: 50, Other: 0 ASA: 2, Airway: 2  Procedure:  The patient was moderately sedated with the above doses of versed and fentanyl.  Using digital technique an omniplane probe was advanced into the distal esophagus without incident. Transgastric imaging revealed normal LV function with no RWMA;s and no mural apical thrombus.   .  Estimated ejection fraction was 55%.  Right sided cardiac chambers were normal with no evidence of pulmonary hypertension.  The pulmonary and tricuspid valves were structurally normal.  There was mild TR The mitral valve was structurally normal with mild mitral regurgitation.  No prolapse The aortic valve was trileaflet with no AS/AR The aortic root was normal.    Imaging of the septum showed no ASD or VSD Bubble study was negative for shunt 2D and color flow confirmed no PFO  The LAA was well visualized in orthogonal views.  There was no spontaneous contrast and no thrombus.  There was moderate to severe LAE  The descending thoracic aorta had no mural aortic debris with no evidence of aneurysmal dilation or disection  Impression:  1)  No LAA thrombus 2) Moderate to severe LAE 3) Normal EF 55% 4) No ASD negative bubble study 5) Mild MR 6) Mild TR 7) No pericardial effusion 8) Normal RV  Charlton Haws 09/02/2012 11:21 AM

## 2012-09-02 NOTE — Interval H&P Note (Signed)
History and Physical Interval Note:  09/02/2012 10:26 AM  Angela Cummings  has presented today for surgery, with the diagnosis of a-fib  The various methods of treatment have been discussed with the patient and family. After consideration of risks, benefits and other options for treatment, the patient has consented to  Procedure(s): TRANSESOPHAGEAL ECHOCARDIOGRAM (TEE) (N/A) as a surgical intervention .  The patient's history has been reviewed, patient examined, no change in status, stable for surgery.  I have reviewed the patient's chart and labs.  Questions were answered to the patient's satisfaction.     Charlton Haws

## 2012-09-02 NOTE — H&P (View-Only) (Signed)
PCP:  Tally Due, MD Primary Cardiologist:  Dr Jacinto Halim  The patient presents today for routine electrophysiology followup.  Since last being seen in our clinic, the patient reports doing very well. She had recurrent afib 2/14 and required cardioversion.  Today, she denies symptoms of chest pain, shortness of breath, orthopnea, PND, lower extremity edema, dizziness, presyncope, syncope, or neurologic sequela.  The patient feels that she is tolerating medications without difficulties and is otherwise without complaint today.   Past Medical History  Diagnosis Date  . Atrial fibrillation 11/2010    paroxysmal  . Bradycardia   . DJD (degenerative joint disease)   . Obesity   . Mild sleep apnea   . Asthma   . Anxiety   . Depression    Past Surgical History  Procedure Laterality Date  . Cholecystectomy    . Cesarean section    . Knee surgery    . Neck tumor resection      benign  . Hernia repair    . Tee without cardioversion  03/13/2012    Procedure: TRANSESOPHAGEAL ECHOCARDIOGRAM (TEE);  Surgeon: Vesta Mixer, MD;  Location: [facility] ENDOSCOPY;  Service: Cardiovascular;  Laterality: N/A;  Rm 2029  . Cardioversion  03/13/2012    Procedure: CARDIOVERSION;  Surgeon: Vesta Mixer, MD;  Location: [facility] ENDOSCOPY;  Service: Cardiovascular;  Laterality: N/A;  . Cardioversion N/A 07/26/2012    Procedure: CARDIOVERSION;  Surgeon: Hillis Range, MD;  Location: [facility] OR;  Service: Cardiovascular;  Laterality: N/A;    Current Outpatient Prescriptions  Medication Sig Dispense Refill  . albuterol (PROVENTIL HFA;VENTOLIN HFA) 108 (90 BASE) MCG/ACT inhaler Inhale 2 puffs into the lungs every 6 (six) hours as needed. For shortness of breath  1 Inhaler  1  . aspirin 81 MG tablet Take 81 mg by mouth daily.        . celecoxib (CELEBREX) 200 MG capsule Take 1 capsule (200 mg total) by mouth daily.  30 capsule  5  . Multiple Vitamin (MULTIVITAMIN WITH MINERALS) TABS Take 1 tablet by mouth daily.      .  nadolol (CORGARD) 20 MG tablet Take 10 mg by mouth daily.      . propafenone (RYTHMOL SR) 225 MG 12 hr capsule Take 1 capsule (225 mg total) by mouth 2 (two) times daily.  60 capsule  6   No current facility-administered medications for this visit.    Allergies  Allergen Reactions  . Adhesive (Tape) Itching and Rash    History   Social History  . Marital Status: Married    Spouse Name: N/A    Number of Children: 3  . Years of Education: N/A   Occupational History  . Not on file.   Social History Main Topics  . Smoking status: Never Smoker   . Smokeless tobacco: Never Used  . Alcohol Use: No     Comment: Rarely  . Drug Use: No  . Sexually Active: Yes   Other Topics Concern  . Not on file   Social History Narrative   Married with 3 children (17,30, 11) and granddaughter (14).   Lives in [place]. Unemployed.    Family History  Problem Relation Age of Onset  . Diabetes Other   . Thyroid disease Other   . Emphysema Mother   . Cancer Father   . Cancer Sister   . Emphysema Brother   . Cancer Daughter     ROS-  All systems are reviewed and are negative  except as outlined in the HPI above  Physical Exam: Filed Vitals:   08/22/12 1035  BP: 133/58  Pulse: 45  Height: 5\' 1"  (1.549 m)  Weight: 234 lb 8 oz (106.369 kg)    GEN- The patient is well appearing, alert and oriented x 3 today.   Head- normocephalic, atraumatic Eyes-  Sclera clear, conjunctiva pink Ears- hearing intact Oropharynx- clear Neck- supple, no JVP Lymph- no cervical lymphadenopathy Lungs- Clear to ausculation bilaterally, normal work of breathing Heart- Regular rate and rhythm, no murmurs, rubs or gallops, PMI not laterally displaced GI- soft, NT, ND, + BS Extremities- no clubbing, cyanosis, or edema MS- no significant deformity or atrophy Skin- no rash or lesion Psych- euthymic mood, full affect Neuro- strength and sensation are intact  ekg today reveals sinus bradycardia 45 bpm,  RBBB  Assessment and Plan:

## 2012-09-03 ENCOUNTER — Encounter (HOSPITAL_COMMUNITY): Payer: Self-pay | Admitting: Anesthesiology

## 2012-09-03 ENCOUNTER — Ambulatory Visit (HOSPITAL_COMMUNITY)
Admission: RE | Admit: 2012-09-03 | Discharge: 2012-09-04 | Disposition: A | Discharge status: Home / Self Care | Payer: 59 | Source: Ambulatory Visit | Attending: Internal Medicine | Admitting: Internal Medicine

## 2012-09-03 ENCOUNTER — Ambulatory Visit (HOSPITAL_COMMUNITY): Payer: 59 | Admitting: Anesthesiology

## 2012-09-03 ENCOUNTER — Encounter (HOSPITAL_COMMUNITY)
Admission: RE | Disposition: A | Discharge status: Home / Self Care | Payer: Self-pay | Source: Ambulatory Visit | Attending: Internal Medicine

## 2012-09-03 DIAGNOSIS — I4892 Unspecified atrial flutter: Secondary | ICD-10-CM

## 2012-09-03 DIAGNOSIS — I4891 Unspecified atrial fibrillation: Secondary | ICD-10-CM

## 2012-09-03 DIAGNOSIS — F329 Major depressive disorder, single episode, unspecified: Secondary | ICD-10-CM | POA: Insufficient documentation

## 2012-09-03 DIAGNOSIS — I480 Paroxysmal atrial fibrillation: Secondary | ICD-10-CM | POA: Diagnosis present

## 2012-09-03 DIAGNOSIS — J45909 Unspecified asthma, uncomplicated: Secondary | ICD-10-CM | POA: Insufficient documentation

## 2012-09-03 DIAGNOSIS — F3289 Other specified depressive episodes: Secondary | ICD-10-CM | POA: Insufficient documentation

## 2012-09-03 DIAGNOSIS — E669 Obesity, unspecified: Secondary | ICD-10-CM | POA: Diagnosis present

## 2012-09-03 DIAGNOSIS — G4733 Obstructive sleep apnea (adult) (pediatric): Secondary | ICD-10-CM | POA: Insufficient documentation

## 2012-09-03 DIAGNOSIS — F411 Generalized anxiety disorder: Secondary | ICD-10-CM | POA: Insufficient documentation

## 2012-09-03 HISTORY — PX: ATRIAL FIBRILLATION ABLATION: SHX5456

## 2012-09-03 LAB — MRSA PCR SCREENING: MRSA by PCR: NEGATIVE

## 2012-09-03 LAB — POCT ACTIVATED CLOTTING TIME: Activated Clotting Time: 258 seconds

## 2012-09-03 SURGERY — ATRIAL FIBRILLATION ABLATION
Anesthesia: Monitor Anesthesia Care

## 2012-09-03 MED ORDER — RIVAROXABAN 20 MG PO TABS
20.0000 mg | ORAL_TABLET | Freq: Every day | ORAL | Status: DC
  Administered 2012-09-03: 20 mg via ORAL
  Filled 2012-09-03 (×2): qty 1

## 2012-09-03 MED ORDER — ACETAMINOPHEN 10 MG/ML IV SOLN
1000.0000 mg | Freq: Once | INTRAVENOUS | Status: AC | PRN
  Filled 2012-09-03: qty 100

## 2012-09-03 MED ORDER — HEPARIN SODIUM (PORCINE) 1000 UNIT/ML IJ SOLN
INTRAMUSCULAR | Status: DC | PRN
  Administered 2012-09-03: 10000 [IU] via INTRAVENOUS
  Administered 2012-09-03: 2000 [IU] via INTRAVENOUS
  Administered 2012-09-03: 5000 [IU] via INTRAVENOUS

## 2012-09-03 MED ORDER — SODIUM CHLORIDE 0.9 % IV SOLN: 250.0000 mL | INTRAVENOUS | Status: DC | PRN

## 2012-09-03 MED ORDER — HEPARIN SODIUM (PORCINE) 1000 UNIT/ML IJ SOLN
INTRAMUSCULAR | Status: AC
  Filled 2012-09-03: qty 1

## 2012-09-03 MED ORDER — SODIUM CHLORIDE 0.9 % IJ SOLN: 3.0000 mL | INTRAMUSCULAR | Status: DC | PRN

## 2012-09-03 MED ORDER — ACETAMINOPHEN 325 MG PO TABS: 650.0000 mg | ORAL_TABLET | ORAL | Status: DC | PRN

## 2012-09-03 MED ORDER — PROTAMINE SULFATE 10 MG/ML IV SOLN
INTRAVENOUS | Status: DC | PRN
  Administered 2012-09-03: 20 mg via INTRAVENOUS
  Administered 2012-09-03 (×2): 10 mg via INTRAVENOUS

## 2012-09-03 MED ORDER — MIDAZOLAM HCL 5 MG/5ML IJ SOLN
INTRAMUSCULAR | Status: DC | PRN
  Administered 2012-09-03 (×2): 1 mg via INTRAVENOUS

## 2012-09-03 MED ORDER — DEXTROSE 5 % IV SOLN
INTRAVENOUS | Status: AC
  Filled 2012-09-03: qty 250

## 2012-09-03 MED ORDER — FENTANYL CITRATE 0.05 MG/ML IJ SOLN
INTRAMUSCULAR | Status: DC | PRN
  Administered 2012-09-03: 25 ug via INTRAVENOUS

## 2012-09-03 MED ORDER — FENTANYL CITRATE 0.05 MG/ML IJ SOLN
25.0000 ug | Freq: Once | INTRAMUSCULAR | Status: AC
  Administered 2012-09-03: 25 ug via INTRAVENOUS

## 2012-09-03 MED ORDER — PROPOFOL INFUSION 10 MG/ML OPTIME
INTRAVENOUS | Status: DC | PRN
  Administered 2012-09-03: 50 ug/kg/min via INTRAVENOUS

## 2012-09-03 MED ORDER — FENTANYL CITRATE 0.05 MG/ML IJ SOLN
INTRAMUSCULAR | Status: AC
  Filled 2012-09-03: qty 2

## 2012-09-03 MED ORDER — HYDROXYUREA 500 MG PO CAPS
ORAL_CAPSULE | ORAL | Status: AC
  Filled 2012-09-03: qty 1

## 2012-09-03 MED ORDER — SODIUM CHLORIDE 0.9 % IJ SOLN
3.0000 mL | Freq: Two times a day (BID) | INTRAMUSCULAR | Status: DC
  Administered 2012-09-03: 13:00:00 via INTRAVENOUS
  Administered 2012-09-03: 3 mL via INTRAVENOUS

## 2012-09-03 MED ORDER — ONDANSETRON HCL 4 MG/2ML IJ SOLN: 4.0000 mg | Freq: Four times a day (QID) | INTRAMUSCULAR | Status: DC | PRN

## 2012-09-03 MED ORDER — LIDOCAINE HCL (CARDIAC) 20 MG/ML IV SOLN
INTRAVENOUS | Status: DC | PRN
  Administered 2012-09-03: 40 mg via INTRAVENOUS

## 2012-09-03 MED ORDER — BUPIVACAINE HCL (PF) 0.25 % IJ SOLN
INTRAMUSCULAR | Status: AC
  Filled 2012-09-03: qty 30

## 2012-09-03 MED ORDER — SODIUM CHLORIDE 0.9 % IV SOLN
INTRAVENOUS | Status: DC | PRN
  Administered 2012-09-03: 07:00:00 via INTRAVENOUS

## 2012-09-03 MED ORDER — HYDROCODONE-ACETAMINOPHEN 5-325 MG PO TABS
1.0000 | ORAL_TABLET | ORAL | Status: DC | PRN
  Administered 2012-09-03 – 2012-09-04 (×3): 2 via ORAL
  Filled 2012-09-03 (×2): qty 1
  Filled 2012-09-03 (×2): qty 2

## 2012-09-03 MED ORDER — PROMETHAZINE HCL 25 MG/ML IJ SOLN: 6.2500 mg | INTRAMUSCULAR | Status: DC | PRN

## 2012-09-03 MED ORDER — ONDANSETRON HCL 4 MG/2ML IJ SOLN
INTRAMUSCULAR | Status: DC | PRN
  Administered 2012-09-03: 4 mg via INTRAVENOUS

## 2012-09-03 MED ORDER — DEXTROSE 5 % IV SOLN
1000.0000 ug | INTRAVENOUS | Status: DC | PRN
  Administered 2012-09-03: 20 ug/min via INTRAVENOUS

## 2012-09-03 MED ORDER — MEPERIDINE HCL 25 MG/ML IJ SOLN: 6.2500 mg | INTRAMUSCULAR | Status: DC | PRN

## 2012-09-03 NOTE — H&P (View-Only) (Signed)
  PCP:  GUEST, CHRIS WARREN, MD Primary Cardiologist:  Dr Ganji  The patient presents today for routine electrophysiology followup.  Since last being seen in our clinic, the patient reports doing very well. She had recurrent afib 2/14 and required cardioversion.  Today, she denies symptoms of chest pain, shortness of breath, orthopnea, PND, lower extremity edema, dizziness, presyncope, syncope, or neurologic sequela.  The patient feels that she is tolerating medications without difficulties and is otherwise without complaint today.   Past Medical History  Diagnosis Date  . Atrial fibrillation 11/2010    paroxysmal  . Bradycardia   . DJD (degenerative joint disease)   . Obesity   . Mild sleep apnea   . Asthma   . Anxiety   . Depression    Past Surgical History  Procedure Laterality Date  . Cholecystectomy    . Cesarean section    . Knee surgery    . Neck tumor resection      benign  . Hernia repair    . Tee without cardioversion  03/13/2012    Procedure: TRANSESOPHAGEAL ECHOCARDIOGRAM (TEE);  Surgeon: Philip J Nahser, MD;  Location: [facility] ENDOSCOPY;  Service: Cardiovascular;  Laterality: N/A;  Rm 2029  . Cardioversion  03/13/2012    Procedure: CARDIOVERSION;  Surgeon: Philip J Nahser, MD;  Location: [facility] ENDOSCOPY;  Service: Cardiovascular;  Laterality: N/A;  . Cardioversion N/A 07/26/2012    Procedure: CARDIOVERSION;  Surgeon: James Allred, MD;  Location: [facility] OR;  Service: Cardiovascular;  Laterality: N/A;    Current Outpatient Prescriptions  Medication Sig Dispense Refill  . albuterol (PROVENTIL HFA;VENTOLIN HFA) 108 (90 BASE) MCG/ACT inhaler Inhale 2 puffs into the lungs every 6 (six) hours as needed. For shortness of breath  1 Inhaler  1  . aspirin 81 MG tablet Take 81 mg by mouth daily.        . celecoxib (CELEBREX) 200 MG capsule Take 1 capsule (200 mg total) by mouth daily.  30 capsule  5  . Multiple Vitamin (MULTIVITAMIN WITH MINERALS) TABS Take 1 tablet by mouth daily.      .  nadolol (CORGARD) 20 MG tablet Take 10 mg by mouth daily.      . propafenone (RYTHMOL SR) 225 MG 12 hr capsule Take 1 capsule (225 mg total) by mouth 2 (two) times daily.  60 capsule  6   No current facility-administered medications for this visit.    Allergies  Allergen Reactions  . Adhesive (Tape) Itching and Rash    History   Social History  . Marital Status: Married    Spouse Name: N/A    Number of Children: 3  . Years of Education: N/A   Occupational History  . Not on file.   Social History Main Topics  . Smoking status: Never Smoker   . Smokeless tobacco: Never Used  . Alcohol Use: No     Comment: Rarely  . Drug Use: No  . Sexually Active: Yes   Other Topics Concern  . Not on file   Social History Narrative   Married with 3 children (17,30, 32) and granddaughter (14).   Lives in [place]. Unemployed.    Family History  Problem Relation Age of Onset  . Diabetes Other   . Thyroid disease Other   . Emphysema Mother   . Cancer Father   . Cancer Sister   . Emphysema Brother   . Cancer Daughter     ROS-  All systems are reviewed and are negative   except as outlined in the HPI above  Physical Exam: Filed Vitals:   08/22/12 1035  BP: 133/58  Pulse: 45  Height: 5' 1" (1.549 m)  Weight: 234 lb 8 oz (106.369 kg)    GEN- The patient is well appearing, alert and oriented x 3 today.   Head- normocephalic, atraumatic Eyes-  Sclera clear, conjunctiva pink Ears- hearing intact Oropharynx- clear Neck- supple, no JVP Lymph- no cervical lymphadenopathy Lungs- Clear to ausculation bilaterally, normal work of breathing Heart- Regular rate and rhythm, no murmurs, rubs or gallops, PMI not laterally displaced GI- soft, NT, ND, + BS Extremities- no clubbing, cyanosis, or edema MS- no significant deformity or atrophy Skin- no rash or lesion Psych- euthymic mood, full affect Neuro- strength and sensation are intact  ekg today reveals sinus bradycardia 45 bpm,  RBBB  Assessment and Plan:   

## 2012-09-03 NOTE — Transfer of Care (Signed)
Immediate Anesthesia Transfer of Care Note  Patient: Angela Cummings  Procedure(s) Performed: Procedure(s): ATRIAL FIBRILLATION ABLATION (N/A)  Patient Location: Cath Lab  Anesthesia Type:MAC  Level of Consciousness: awake, alert , oriented and patient cooperative  Airway & Oxygen Therapy: Patient Spontanous Breathing and Patient connected to face mask oxygen  Post-op Assessment: Report given to PACU RN and Post -op Vital signs reviewed and stable  Post vital signs: Reviewed and stable  Complications: No apparent anesthesia complications

## 2012-09-03 NOTE — Interval H&P Note (Signed)
History and Physical Interval Note:  09/03/2012 7:34 AM  Angela Cummings  has presented today for surgery, with the diagnosis of afib  The various methods of treatment have been discussed with the patient and family. After consideration of risks, benefits and other options for treatment, the patient has consented to  Procedure(s): ATRIAL FIBRILLATION ABLATION (N/A) as a surgical intervention .  The patient's history has been reviewed, patient examined, no change in status, stable for surgery.  I have reviewed the patient's chart and labs.  Questions were answered to the patient's satisfaction.     Hillis Range

## 2012-09-03 NOTE — Anesthesia Procedure Notes (Signed)
Procedure Name: LMA Insertion Date/Time: 09/03/2012 7:53 AM Performed by: Lovie Chol Pre-anesthesia Checklist: Patient identified, Emergency Drugs available, Suction available, Patient being monitored and Timeout performed Patient Re-evaluated:Patient Re-evaluated prior to inductionOxygen Delivery Method: Simple face mask Preoxygenation: Pre-oxygenation with 100% oxygen

## 2012-09-03 NOTE — Progress Notes (Signed)
Utilization Review Completed.Shantanu Strauch T3/25/2014  

## 2012-09-03 NOTE — Progress Notes (Signed)
09/03/2012 1815 Bedrest complete Assisted to bathroom. Stable on feet. Venous site in Rt groin started to bleed. Assisted back to bed. Pressure held x 10 minuted. Pressure dssg applied. instucted to remain on bed rest for the next 30-40 minutes and will reassess. Kielee Care, Linnell Fulling

## 2012-09-03 NOTE — Op Note (Signed)
SURGEON:  Hillis Range, MD  PREPROCEDURE DIAGNOSES: 1. Paroxysmal atrial fibrillation.  POSTPROCEDURE DIAGNOSES: 1. Paroxysmal  atrial fibrillation. 2. Isthmus dependant right atrial flutter 3. Left atrial flutter  PROCEDURES: 1. Comprehensive electrophysiologic study. 2. Coronary sinus pacing and recording. 3. Three-dimensional mapping of atrial fibrillation with additional mapping and ablation of a second discrete focus 4. Ablation of  atrial fibrillation with additional mapping and ablation of a second discrete focus 5. Elective external cardioversion. 6. Intracardiac echocardiography. 7. Transseptal puncture of an intact septum. 8. Rotational Angiography with processing at an independent workstation 9. Arrhythmia induction with pacing with isuprel infusion  INTRODUCTION:  Angela Cummings is a 57 y.o. female with a history of paroxysmal atrial fibrillation who now presents for EP study and radiofrequency ablation.  The patient reports initially being diagnosed with atrial fibrillation after presenting with symptomatic palpitations and fatgiue. The patient reports increasing frequency and duration of atrial arrhythmias since that time.  The patient has failed medical therapy with Ic Antiarrhythmic medication.  The patient therefore presents today for catheter ablation of atrial fibrillation.  DESCRIPTION OF PROCEDURE:  Informed written consent was obtained, and the patient was brought to the electrophysiology lab in a fasting state.  The patient was adequately sedated with intravenous medications as outlined in the anesthesia report.  The patient's left and right groins were prepped and draped in the usual sterile fashion by the EP lab staff.  Using a percutaneous Seldinger technique, two 7-French and one 11-French hemostasis sheaths were placed into the right common femoral vein.     3 Dimensional Rotational Angiography: A 5 french pigtail catheter was introduced through the right  common femoral vein and advanced into the inferior venocava.  3 demential rotational angiography was then performed by power injection of 100cc of nonionic contrast.  Reprocessing at an independent work station was then performed.   This demonstrated a moderate to large sized left atrium with 4 separate pulmonary veins which were also moderate to large in size.  There were no anomalous veins or significant abnormalities.  A 3 dimensional rendering of the left atrium was then merged using [employer] onto the [employer] system and registered with intracardiac echo (see below).  The pigtail catheter was then removed.  Catheter Placement:  A 7-French Biosense Webster Decapolar coronary sinus catheter was introduced through the right common femoral vein and advanced into the coronary sinus for recording and pacing from this location.  A 6-French quadripolar Josephson catheter was introduced through the right common femoral vein and advanced into the right ventricle for recording and pacing.  This catheter was then pulled back to the His bundle location.    Initial Measurements: The patient presented to the electrophysiology lab in sinus rhythm.  Her PR interval measured with a QRS duringation of  and a QT interval of 493 msec.  The AH interval measured 112 msec and the HV interval measured 44 msec.     Intracardiac Echocardiography: A 10-French Biosense Webster AcuNav intracardiac echocardiography catheter was introduced through the left common femoral vein and advanced into the right atrium. Intracardiac echocardiography was performed of the left atrium, and a three-dimensional anatomical rendering of the left atrium was performed using CARTO sound technology.  The patient was noted to have a moderate to large sized left atrium.  The interatrial septum was prominent but not aneurysmal. All 4 pulmonary veins were visualized and noted to have separate ostia.  The inferior pulmonary  veins were moderate in  size.  The superior pulmonary veins were large in size. The left atrial appendage was visualized and did not reveal thrombus.   There was no evidence of pulmonary vein stenosis.   Transseptal Puncture: The middle right common femoral vein sheath was exchanged for an 8.5 [place] SL2 transseptal sheath and transseptal access was achieved in a standard fashion using a Brockenbrough needle under biplane fluoroscopy with intracardiac echocardiography confirmation of the transseptal puncture.  Once transseptal access had been achieved, heparin was administered intravenously and intra- arterially in order to maintain an ACT of greater than 350 seconds throughout the procedure.   3D Mapping and Ablation: The His bundle catheter was removed and in its place a 3.5 mm Biosense Webster EZ [employer] ablation catheter was advanced into the right atrium.  The transseptal sheath was pulled back into the IVC over a guidewire.  The ablation catheter was advanced across the transseptal hole using the wire as a guide.  The transseptal sheath was then re-advanced over the guidewire into the left atrium.  A duodecapolar Biosense Webster circular mapping catheter was introduced through the transseptal sheath and positioned over the mouth of all 4 pulmonary veins.  Three-dimensional electroanatomical mapping was performed using CARTO technology.  This demonstrated electrical activity within all four pulmonary veins at baseline. The patient underwent successful sequential electrical isolation and anatomical encircling of all four pulmonary veins using radiofrequency current with a circular mapping catheter as a guide.   The circular mapping catheter was pulled back into the right atrium and 3D mapping was performed at the junction of the superior vena cava and right atrium.  Electrical activity was observed within the SVC.  I therefore elected to perform right atrial ablation in this area.  A series of  radiofrequency applications were delivered in a circular fashion around the ostium of the SVC.  Prior to each ablation lesions, pacing was performed from the distal ablation electrode to insure that diaphragmatic stimulation was not observed to avoid phrenic nerve injury.  Diaphragmatic excursion was also observed during ablation.    Following ablation, Isuprel was infused up to 20 mcg/min with sustained atrial flutter induced with rapid atrial flutter.  The atrial flutter cycle length measured 280 msec with proximal to distal CS activation suggesting right atrial flutter.  Entrainment mapping was performed which revealed a long PPI when compared to the tachycardia cycle length.  Entrainment from the CTI revealed a PPI equal to the tachycardia cycle length.  She was therefore felt to have isthmus dependant right atrial flutter.  I therefore elected to perform CTI ablation today. The ablation catheter was positioned along the cavo-tricuspid isthmus.  Mapping along the atrial side of the isthmus was performed.  This demonstrated a standard isthmus.  A series of radiofrequency applications were then delivered along the isthmus.  After prolonged ablation, the tachycardia cycle length shortened and atrial activation changed.  This was felt to represent a different atrial flutter with a CL of .  Entraiment was suggestive of left atrial flutter.  I therefore elected to cardiovert this additional atrial flutter.  She was cardioverted to sinus rhythm with a single synchronized 360J biphasic shock delivered. She remained in sinus thereafter. Complete bidirectional cavotricuspid isthmus block was achieved as confirmed by differential atrial pacing from the low lateral right atrium.  A stimulus to earliest atrial activation across the isthmus measured 150 msec bi-directionally.  The patient was observe without return of conduction through the isthmus.  Measurements Following Ablation: Following ablation the  AH  interval measured  118 msec with an HV interval of 47 msec. Ventricular pacing was performed, which revealed VA dissociation when pacing at 600 msec. Rapid atrial pacing was performed, which revealed an AV Wenckebach cycle length of 360 msec.   The procedure was therefore considered completed.  All catheters were removed, and the sheaths were aspirated and flushed.  The patient was transferred to the recovery area for sheath removal per protocol.  A limited bedside transthoracic echocardiogram revealed no pericardial effusion.  There were no early apparent complications.  CONCLUSIONS: 1. Sinus rhythm upon presentation.   2. Rotational Angiography reveals a large sized left atrium with four separate pulmonary veins without evidence of pulmonary vein stenosis. 3. Successful electrical isolation and anatomical encircling of all four pulmonary veins with radiofrequency current. 4. Cavo-tricuspid isthmus ablation was performed with complete bidirectional isthmus block achieved.   5. RF ablation performed at the SVC/ RA junction.   6. Atypical atrial flutter cardioverted successfully 7. No early apparent complications.   Kace Hartje,MD 11:19 AM 09/03/2012

## 2012-09-03 NOTE — Preoperative (Signed)
Beta Blockers   Reason not to administer Beta Blockers:Not Applicable 

## 2012-09-03 NOTE — Anesthesia Preprocedure Evaluation (Addendum)
Anesthesia Evaluation  Patient identified by MRN, date of birth, ID band Patient awake    Reviewed: Allergy & Precautions, H&P , NPO status , Patient's Chart, lab work & pertinent test results  History of Anesthesia Complications Negative for: history of anesthetic complications  Airway Mallampati: II TM Distance: >3 FB Neck ROM: Full    Dental  (+) Dental Advisory Given and Edentulous Upper   Pulmonary asthma , sleep apnea ,          Cardiovascular + dysrhythmias Atrial Fibrillation Rhythm:Irregular     Neuro/Psych PSYCHIATRIC DISORDERS Anxiety Depression    GI/Hepatic negative GI ROS, Neg liver ROS,   Endo/Other  Morbid obesity  Renal/GU negative Renal ROS  negative genitourinary   Musculoskeletal  (+) Arthritis -, Osteoarthritis,    Abdominal (+) + obese,   Peds  Hematology negative hematology ROS (+)   Anesthesia Other Findings   Reproductive/Obstetrics negative OB ROS                         Anesthesia Physical  Anesthesia Plan  ASA: III  Anesthesia Plan: MAC   Post-op Pain Management:    Induction: Intravenous  Airway Management Planned: Nasal Cannula and Simple Face Mask  Additional Equipment:   Intra-op Plan:   Post-operative Plan:   Informed Consent: I have reviewed the patients History and Physical, chart, labs and discussed the procedure including the risks, benefits and alternatives for the proposed anesthesia with the patient or authorized representative who has indicated his/her understanding and acceptance.   Dental advisory given  Plan Discussed with: CRNA  Anesthesia Plan Comments:       Anesthesia Quick Evaluation

## 2012-09-03 NOTE — Anesthesia Postprocedure Evaluation (Signed)
  Anesthesia Post-op Note  Patient: Angela Cummings  Procedure(s) Performed: Procedure(s): ATRIAL FIBRILLATION ABLATION (N/A)  Patient Location: PACU  Anesthesia Type:MAC  Level of Consciousness: awake, alert , oriented and patient cooperative  Airway and Oxygen Therapy: Patient Spontanous Breathing and Patient connected to nasal cannula oxygen  Post-op Pain: none  Post-op Assessment: Post-op Vital signs reviewed, Patient's Cardiovascular Status Stable, Respiratory Function Stable, Patent Airway, No signs of Nausea or vomiting and Pain level controlled  Post-op Vital Signs: Reviewed and stable  Complications: No apparent anesthesia complications

## 2012-09-04 DIAGNOSIS — I4891 Unspecified atrial fibrillation: Secondary | ICD-10-CM

## 2012-09-04 MED ORDER — PANTOPRAZOLE SODIUM 40 MG PO TBEC: 40.0000 mg | DELAYED_RELEASE_TABLET | Freq: Every day | ORAL | Status: DC

## 2012-09-04 NOTE — Progress Notes (Signed)
Discharge information given to patient. Verbalized understanding. IV removed. Patient wheeled to car by tech. No distress noted.

## 2012-09-04 NOTE — Discharge Summary (Signed)
ELECTROPHYSIOLOGY DISCHARGE SUMMARY    Patient ID: Angela Cummings,  MRN: [id number], DOB/AGE: 59/17/1957 60 y.o.  Admit date: 09/03/2012 Discharge date: 09/04/2012  Primary Care Physician: Robert Bellow, MD Primary Cardiologist: Jacinto Halim, MD  Primary Discharge Diagnosis:  1. Symptomatic PAF, now s/p AF ablation 2. Atypical atrial flutter s/p DCCV  Secondary Discharge Diagnoses:  1. OSA 2. Asthma 3. Anxiety/depression 4. Obesity  Procedures This Admission:   1. Comprehensive electrophysiologic study.  2. Coronary sinus pacing and recording.  3. Three-dimensional mapping of atrial fibrillation with additional mapping and ablation of a second discrete focus  4. Ablation of atrial fibrillation with additional mapping and ablation of a second discrete focus  5. Elective external cardioversion.  6. Intracardiac echocardiography.  7. Transseptal puncture of an intact septum.  8. Rotational Angiography with processing at an independent workstation  9. Arrhythmia induction with pacing with isuprel infusion CONCLUSIONS:  1. Sinus rhythm upon presentation.  2. Rotational Angiography reveals a large sized left atrium with four separate pulmonary veins without evidence of pulmonary vein stenosis.  3. Successful electrical isolation and anatomical encircling of all four pulmonary veins with radiofrequency current.  4. Cavo-tricuspid isthmus ablation was performed with complete bidirectional isthmus block achieved.  5. RF ablation performed at the SVC/ RA junction.  6. Atypical atrial flutter cardioverted successfully  7. No early apparent complications.  History and Hospital Course:  Angela Cummings is a 57 y.o. female with paroxysmal atrial fibrillation who reports initially being diagnosed with atrial fibrillation after presenting with symptomatic palpitations and fatgiue. The patient reports increasing frequency and duration of atrial arrhythmias since that time. The patient has failed  medical therapy with Ic antiarrhythmic medication. She therefore presented yesterday for catheter ablation of atrial fibrillation. Mr. Ruggerio tolerated this procedure well without any immediate complication. She remains hemodynamically stable and afebrile. She did experience some oozing from the groin site early last night post procedure which resolved with manual pressure and pressure bandage. This morning her groin site is intact without significant bleeding or hematoma. Telemetry reveals SR. She has been given discharge instructions including wound care and activity restrictions. She will follow-up in clinic in 12 weeks. She has been seen, examined and deemed stable for discharge today by Dr. Hillis Range.  Physical Exam: Vitals: Blood pressure 117/45, pulse 63, temperature 98.4 F (36.9 C), temperature source Oral, resp. rate 15, height 5\' 1"  (1.549 m), weight 236 lb 8.9 oz (107.3 kg), SpO2 95.00%.  General: WD, well appearing 57 year old female in no acute distress. Heart: RRR. No murmur, rub or S3. Lungs: CTA bilaterally. No wheezes, rales or rhonchi. Abdomen: Soft, nondistended. Extremities: No cyanosis, clubbing or edema. Groin site intact without significant bleeding or hematoma.  Labs: Lab Results  Component Value Date   WBC 6.9 08/22/2012   HGB 11.4* 08/22/2012   HCT 34.9* 08/22/2012   MCV 86.0 08/22/2012   PLT 212.0 08/22/2012   No results found for this basename: NA, K, CL, CO2, BUN, CREATININE, CALCIUM, LABALBU, PROT, BILITOT, ALKPHOS, ALT, AST, GLUCOSE,  in the last 168 hours    Disposition:  The patient is being discharged in stable condition.  Follow-up: Follow-up Information   Follow up with Hillis Range, MD On 12/05/2012. (At 9:15 AM)    Contact information:   [address] [place] [address] [phone]     Discharge Medications:    Medication List    TAKE these medications       acetaminophen  500 MG tablet  Commonly known as:  TYLENOL    Take 1,000 mg by mouth every 6 (six) hours as needed for pain.     albuterol 108 (90 BASE) MCG/ACT inhaler  Commonly known as:  PROVENTIL HFA;VENTOLIN HFA  Inhale 2 puffs into the lungs every 6 (six) hours as needed. For shortness of breath     aspirin 81 MG tablet  Take 81 mg by mouth daily.     celecoxib 200 MG capsule  Commonly known as:  CELEBREX  Take 1 capsule (200 mg total) by mouth daily.     folic acid 400 MCG tablet  Commonly known as:  FOLVITE  Take 400 mcg by mouth daily.     multivitamin with minerals Tabs  Take 1 tablet by mouth daily.     nadolol 20 MG tablet  Commonly known as:  CORGARD  Take 10 mg by mouth daily.     pantoprazole 40 MG tablet  Commonly known as:  PROTONIX  Take 1 tablet (40 mg total) by mouth daily.     propafenone 225 MG 12 hr capsule  Commonly known as:  RYTHMOL SR  Take 1 capsule (225 mg total) by mouth 2 (two) times daily.     Rivaroxaban 20 MG Tabs  Commonly known as:  XARELTO  Take 1 tablet (20 mg total) by mouth daily.       Duration of Discharge Encounter: Greater than 30 minutes including physician time.  Limmie Patricia, PA-C 09/04/2012, 7:05 AM  Hillis Range, MD

## 2012-10-08 ENCOUNTER — Other Ambulatory Visit (HOSPITAL_COMMUNITY): Payer: Self-pay | Admitting: Cardiology

## 2012-11-18 ENCOUNTER — Encounter: Payer: 59 | Admitting: Internal Medicine

## 2012-12-05 ENCOUNTER — Encounter: Payer: 59 | Admitting: Internal Medicine

## 2012-12-10 ENCOUNTER — Telehealth: Payer: Self-pay | Admitting: Internal Medicine

## 2012-12-10 NOTE — Telephone Encounter (Addendum)
Called patient back. She brought her neighbor to [facility] last week. He has muscular dystrophy and weighs at least 250 pounds. She was helping him move around frequently and thinks she pulled muscles in her chest wall but wanted to speak with [place] about this, Has chest pain with movement only for the past 5 days. No history of CAD. Denies any palps.  Advised her to keep appointment with Dr.Allred for 7/7, take Tylenol for the pain and to report to the ER if the pain worsens or symptoms change. Patient verbalized understanding.   No palps or SOB.

## 2012-12-10 NOTE — Telephone Encounter (Signed)
New Prob     Pt has been experiencing chest pain (only when she does extraneous activity) on and off for about a week. Would like to speak to nurse regarding this.

## 2012-12-10 NOTE — Telephone Encounter (Signed)
Tried to call patient back. No answer.

## 2012-12-16 ENCOUNTER — Encounter: Payer: 59 | Admitting: Internal Medicine

## 2012-12-16 ENCOUNTER — Encounter: Payer: Self-pay | Admitting: Internal Medicine

## 2012-12-16 ENCOUNTER — Ambulatory Visit (INDEPENDENT_AMBULATORY_CARE_PROVIDER_SITE_OTHER): Payer: 59 | Admitting: Internal Medicine

## 2012-12-16 VITALS — BP 118/72 | HR 48 | Ht 61.0 in | Wt 226.0 lb

## 2012-12-16 DIAGNOSIS — I4891 Unspecified atrial fibrillation: Secondary | ICD-10-CM

## 2012-12-16 NOTE — Progress Notes (Signed)
PCP:  Tally Due, MD Primary Cardiologist: Dr Jacinto Halim  The patient presents today for routine electrophysiology followup.  Since her PVI in March, she has done very well.  She reports that she has had no afib.  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  She has had no exertional symptoms.    Today, she denies symptoms of chest pain, shortness of breath, orthopnea, PND, lower extremity edema, dizziness, presyncope, syncope, or neurologic sequela.  The patient feels that she is tolerating medications without difficulties and is otherwise without complaint today.   Past Medical History  Diagnosis Date  . Atrial fibrillation 11/2010    paroxysmal  . Bradycardia   . DJD (degenerative joint disease)   . Obesity   . Mild sleep apnea   . Asthma   . Anxiety   . Depression    Past Surgical History  Procedure Laterality Date  . Cholecystectomy    . Cesarean section    . Knee surgery    . Neck tumor resection      benign  . Hernia repair    . Tee without cardioversion  03/13/2012    Procedure: TRANSESOPHAGEAL ECHOCARDIOGRAM (TEE);  Surgeon: Vesta Mixer, MD;  Location: [facility] ENDOSCOPY;  Service: Cardiovascular;  Laterality: N/A;  Rm 2029  . Cardioversion  03/13/2012    Procedure: CARDIOVERSION;  Surgeon: Vesta Mixer, MD;  Location: [facility] ENDOSCOPY;  Service: Cardiovascular;  Laterality: N/A;  . Cardioversion N/A 07/26/2012    Procedure: CARDIOVERSION;  Surgeon: Hillis Range, MD;  Location: [facility] OR;  Service: Cardiovascular;  Laterality: N/A;  . Tee without cardioversion N/A 09/02/2012    Procedure: TRANSESOPHAGEAL ECHOCARDIOGRAM (TEE);  Surgeon: Wendall Stade, MD;  Location: [facility] ENDOSCOPY;  Service: Cardiovascular;  Laterality: N/A;    Current Outpatient Prescriptions  Medication Sig Dispense Refill  . acetaminophen (TYLENOL) 500 MG tablet Take 1,000 mg by mouth every 6 (six) hours as needed for pain.      Marland Kitchen albuterol  (PROVENTIL HFA;VENTOLIN HFA) 108 (90 BASE) MCG/ACT inhaler Inhale 2 puffs into the lungs every 6 (six) hours as needed. For shortness of breath  1 Inhaler  1  . aspirin 81 MG tablet Take 81 mg by mouth daily.       . celecoxib (CELEBREX) 200 MG capsule Take 1 capsule (200 mg total) by mouth daily.  30 capsule  5  . folic acid (FOLVITE) 400 MCG tablet Take 400 mcg by mouth daily.      . Multiple Vitamin (MULTIVITAMIN WITH MINERALS) TABS Take 1 tablet by mouth daily.      . nadolol (CORGARD) 20 MG tablet Take 10 mg by mouth daily.      . pantoprazole (PROTONIX) 40 MG tablet TAKE 1 TABLET BY MOUTH DAILY  30 tablet  2  . propafenone (RYTHMOL SR) 225 MG 12 hr capsule Take 1 capsule (225 mg total) by mouth 2 (two) times daily.  60 capsule  6  . Rivaroxaban (XARELTO) 20 MG TABS Take 1 tablet (20 mg total) by mouth daily.  30 tablet  3   No current facility-administered medications for this visit.    Allergies  Allergen Reactions  . Adhesive (Tape) Itching and Rash    History   Social History  . Marital Status: Married    Spouse Name: N/A    Number of Children: 3  . Years of Education: N/A   Occupational History  .  Not on file.   Social History Main Topics  . Smoking status: Never Smoker   . Smokeless tobacco: Never Used  . Alcohol Use: No     Comment: Rarely  . Drug Use: No  . Sexually Active: Yes   Other Topics Concern  . Not on file   Social History Narrative   Married with 3 children (17,30, 33) and granddaughter (14).   Lives in [place]. Unemployed.    Family History  Problem Relation Age of Onset  . Diabetes Other   . Thyroid disease Other   . Emphysema Mother   . Cancer Father   . Cancer Sister   . Emphysema Brother   . Cancer Daughter     ROS-  All systems are reviewed and are negative except as outlined in the HPI above  Physical Exam: Filed Vitals:   12/16/12 1044  BP: 118/72  Height: 5\' 1"  (1.549 m)  Weight: 226 lb (102.513 kg)    GEN- The  patient is well appearing, alert and oriented x 3 today.   Head- normocephalic, atraumatic Eyes-  Sclera clear, conjunctiva pink Ears- hearing intact Oropharynx- clear Neck- supple, no JVP Lymph- no cervical lymphadenopathy Lungs- Clear to ausculation bilaterally, normal work of breathing Heart- Regular rate and rhythm, no murmurs, rubs or gallops, PMI not laterally displaced GI- soft, NT, ND, + BS Extremities- no clubbing, cyanosis, or edema MS- no significant deformity or atrophy Skin- no rash or lesion Psych- euthymic mood, full affect Neuro- strength and sensation are intact  ekg today reveals sinus rhythm 48 bpm, PR 178 , incomplete RBBB  Assessment and Plan:  1. afib Doing well s/p ablation without recurrence Continue xarelto, stop ASA Stop rhythmol  Return in 3 months

## 2012-12-16 NOTE — Patient Instructions (Addendum)
Your physician recommends that you schedule a follow-up appointment in:  3 MONTHS WITH DR [facility] Your physician has recommended you make the following change in your medication:  STOP ASPIRIN AND RTHYMOL

## 2013-01-31 ENCOUNTER — Other Ambulatory Visit: Payer: Self-pay | Admitting: Internal Medicine

## 2013-01-31 ENCOUNTER — Other Ambulatory Visit: Payer: Self-pay | Admitting: Family Medicine

## 2013-01-31 NOTE — Telephone Encounter (Signed)
Needs f/u - was due June 2014.  First notice

## 2013-02-07 ENCOUNTER — Emergency Department (HOSPITAL_COMMUNITY)
Admission: EM | Admit: 2013-02-07 | Discharge: 2013-02-08 | Disposition: A | Discharge status: Home / Self Care | Payer: 59 | Attending: Emergency Medicine | Admitting: Emergency Medicine

## 2013-02-07 ENCOUNTER — Encounter (HOSPITAL_COMMUNITY): Payer: Self-pay | Admitting: Emergency Medicine

## 2013-02-07 DIAGNOSIS — M1990 Unspecified osteoarthritis, unspecified site: Secondary | ICD-10-CM | POA: Insufficient documentation

## 2013-02-07 DIAGNOSIS — Z79899 Other long term (current) drug therapy: Secondary | ICD-10-CM | POA: Insufficient documentation

## 2013-02-07 DIAGNOSIS — J45901 Unspecified asthma with (acute) exacerbation: Secondary | ICD-10-CM | POA: Insufficient documentation

## 2013-02-07 DIAGNOSIS — E669 Obesity, unspecified: Secondary | ICD-10-CM | POA: Insufficient documentation

## 2013-02-07 DIAGNOSIS — Z8679 Personal history of other diseases of the circulatory system: Secondary | ICD-10-CM | POA: Insufficient documentation

## 2013-02-07 DIAGNOSIS — Z7982 Long term (current) use of aspirin: Secondary | ICD-10-CM | POA: Insufficient documentation

## 2013-02-07 DIAGNOSIS — R000 Tachycardia, unspecified: Secondary | ICD-10-CM | POA: Insufficient documentation

## 2013-02-07 DIAGNOSIS — R110 Nausea: Secondary | ICD-10-CM | POA: Insufficient documentation

## 2013-02-07 DIAGNOSIS — R002 Palpitations: Secondary | ICD-10-CM | POA: Insufficient documentation

## 2013-02-07 DIAGNOSIS — Z7901 Long term (current) use of anticoagulants: Secondary | ICD-10-CM | POA: Insufficient documentation

## 2013-02-07 DIAGNOSIS — F411 Generalized anxiety disorder: Secondary | ICD-10-CM | POA: Insufficient documentation

## 2013-02-07 DIAGNOSIS — Z8659 Personal history of other mental and behavioral disorders: Secondary | ICD-10-CM | POA: Insufficient documentation

## 2013-02-07 DIAGNOSIS — I4891 Unspecified atrial fibrillation: Secondary | ICD-10-CM

## 2013-02-07 LAB — CBC WITH DIFFERENTIAL/PLATELET
Eosinophils Absolute: 0.2 10*3/uL (ref 0.0–0.7)
Eosinophils Relative: 2 % (ref 0–5)
Hemoglobin: 12.3 g/dL (ref 12.0–15.0)
Lymphs Abs: 2.7 10*3/uL (ref 0.7–4.0)
MCH: 28.6 pg (ref 26.0–34.0)
MCV: 85.3 fL (ref 78.0–100.0)
Monocytes Relative: 11 % (ref 3–12)
Neutrophils Relative %: 57 % (ref 43–77)
RBC: 4.3 MIL/uL (ref 3.87–5.11)

## 2013-02-07 LAB — BASIC METABOLIC PANEL
BUN: 12 mg/dL (ref 6–23)
CO2: 22 mEq/L (ref 19–32)
GFR calc non Af Amer: >90 mL/min (ref >90)
Glucose, Bld: 114 mg/dL — ABNORMAL HIGH (ref 70–99)
Potassium: 3.4 mEq/L — ABNORMAL LOW (ref 3.5–5.1)

## 2013-02-07 MED ORDER — SODIUM CHLORIDE 0.9 % IV BOLUS (SEPSIS)
1000.0000 mL | Freq: Once | INTRAVENOUS | Status: AC
  Administered 2013-02-07: 1000 mL via INTRAVENOUS

## 2013-02-07 MED ORDER — DILTIAZEM HCL 100 MG IV SOLR
5.0000 mg/h | INTRAVENOUS | Status: DC
  Administered 2013-02-07: 5 mg/h via INTRAVENOUS

## 2013-02-07 MED ORDER — DILTIAZEM LOAD VIA INFUSION
25.0000 mg | Freq: Once | INTRAVENOUS | Status: AC
  Administered 2013-02-07: 25 mg via INTRAVENOUS
  Filled 2013-02-07: qty 25

## 2013-02-07 NOTE — ED Notes (Signed)
Per EMS- Pt was watching tv when she started having chest pain about an hour ago. Pt states her chest pain is centralized and radiating to R jaw, describes chest pain as burning. EMS gave four baby asprin and one nitro upon arrival, per ems.

## 2013-02-07 NOTE — ED Notes (Addendum)
Pt. Currently in Afib. Hx of same. Rate controlled with cardizem. EDP notified. Pt. States "I feel so much better".

## 2013-02-07 NOTE — ED Provider Notes (Signed)
CSN: [id number]     Arrival date & time 02/07/13  2147 History   First MD Initiated Contact with Patient 02/07/13 2150     Chief Complaint  Patient presents with  . Chest Pain   (Consider location/radiation/quality/duration/timing/severity/associated sxs/prior Treatment) Patient is a 57 y.o. female presenting with palpitations.  Palpitations Palpitations quality:  Irregular Onset quality:  Sudden Duration: 1-2 hours. Timing:  Constant Progression:  Unchanged Chronicity:  Recurrent Context comment:  Change in meds about a week ago Relieved by:  Nothing Worsened by:  Nothing tried Associated symptoms: chest pain, nausea and shortness of breath   Associated symptoms: no cough and no vomiting     Past Medical History  Diagnosis Date  . Atrial fibrillation 11/2010    paroxysmal  . Bradycardia   . DJD (degenerative joint disease)   . Obesity   . Mild sleep apnea   . Asthma   . Anxiety   . Depression    Past Surgical History  Procedure Laterality Date  . Cholecystectomy    . Cesarean section    . Knee surgery    . Neck tumor resection      benign  . Hernia repair    . Tee without cardioversion  03/13/2012    Procedure: TRANSESOPHAGEAL ECHOCARDIOGRAM (TEE);  Surgeon: Vesta Mixer, MD;  Location: [facility] ENDOSCOPY;  Service: Cardiovascular;  Laterality: N/A;  Rm 2029  . Cardioversion  03/13/2012    Procedure: CARDIOVERSION;  Surgeon: Vesta Mixer, MD;  Location: [facility] ENDOSCOPY;  Service: Cardiovascular;  Laterality: N/A;  . Cardioversion N/A 07/26/2012    Procedure: CARDIOVERSION;  Surgeon: Hillis Range, MD;  Location: [facility] OR;  Service: Cardiovascular;  Laterality: N/A;  . Tee without cardioversion N/A 09/02/2012    Procedure: TRANSESOPHAGEAL ECHOCARDIOGRAM (TEE);  Surgeon: Wendall Stade, MD;  Location: [facility] ENDOSCOPY;  Service: Cardiovascular;  Laterality: N/A;   Family History  Problem Relation Age of Onset  . Diabetes Other   . Thyroid disease Other   . Emphysema Mother   .  Cancer Father   . Cancer Sister   . Emphysema Brother   . Cancer Daughter    History  Substance Use Topics  . Smoking status: Never Smoker   . Smokeless tobacco: Never Used  . Alcohol Use: No     Comment: Rarely   OB History   Grav Para Term Preterm Abortions TAB SAB Ect Mult Living                 Review of Systems  Constitutional: Negative for fever.  HENT: Negative for congestion.   Respiratory: Positive for shortness of breath. Negative for cough.   Cardiovascular: Positive for chest pain and palpitations.  Gastrointestinal: Positive for nausea. Negative for vomiting, abdominal pain and diarrhea.  All other systems reviewed and are negative.    Allergies  Adhesive  Home Medications   Current Outpatient Rx  Name  Route  Sig  Dispense  Refill  . acetaminophen (TYLENOL) 500 MG tablet   Oral   Take 1,000 mg by mouth every 6 (six) hours as needed for pain.         Marland Kitchen albuterol (PROVENTIL HFA;VENTOLIN HFA) 108 (90 BASE) MCG/ACT inhaler   Inhalation   Inhale 2 puffs into the lungs every 6 (six) hours as needed. For shortness of breath   1 Inhaler   1   . aspirin 81 MG tablet   Oral   Take 81 mg by mouth daily.          Marland Kitchen  celecoxib (CELEBREX) 200 MG capsule   Oral   Take 1 capsule (200 mg total) by mouth daily. NEED VISIT!   30 capsule   0   . folic acid (FOLVITE) 400 MCG tablet   Oral   Take 400 mcg by mouth daily.         . Multiple Vitamin (MULTIVITAMIN WITH MINERALS) TABS   Oral   Take 1 tablet by mouth daily.         . nadolol (CORGARD) 20 MG tablet   Oral   Take 10 mg by mouth daily.         . pantoprazole (PROTONIX) 40 MG tablet      TAKE 1 TABLET BY MOUTH DAILY   30 tablet   2   . propafenone (RYTHMOL SR) 225 MG 12 hr capsule   Oral   Take 1 capsule (225 mg total) by mouth 2 (two) times daily.   60 capsule   6   . Rivaroxaban (XARELTO) 20 MG TABS   Oral   Take 1 tablet (20 mg total) by mouth daily.   30 tablet   3    BP  117/85  Pulse 176  Resp 18  Ht 5\' 1"  (1.549 m)  Wt 220 lb (99.791 kg)  BMI 41.59 kg/m2 Physical Exam  Nursing note and vitals reviewed. Constitutional: She is oriented to person, place, and time. She appears well-developed and well-nourished. She appears distressed.  HENT:  Head: Normocephalic and atraumatic.  Mouth/Throat: Oropharynx is clear and moist.  Eyes: Conjunctivae are normal. Pupils are equal, round, and reactive to light. No scleral icterus.  Neck: Neck supple.  Cardiovascular: Normal heart sounds and intact distal pulses.  An irregularly irregular rhythm present. Tachycardia present.   No murmur heard. Pulmonary/Chest: Effort normal and breath sounds normal. No stridor. No respiratory distress. She has no rales.  Abdominal: Soft. Bowel sounds are normal. She exhibits no distension. There is no tenderness.  Musculoskeletal: Normal range of motion.  Neurological: She is alert and oriented to person, place, and time.  Skin: Skin is warm. No rash noted. She is diaphoretic.  Psychiatric: She has a normal mood and affect. Her behavior is normal.    ED Course  CRITICAL CARE Performed by: Blake Divine DAVID Authorized by: Blake Divine DAVID Total critical care time: 30 minutes Critical care time was exclusive of separately billable procedures and treating other patients. Critical care was necessary to treat or prevent imminent or life-threatening deterioration of the following conditions: cardiac failure and circulatory failure. Critical care was time spent personally by me on the following activities: development of treatment plan with patient or surrogate, discussions with consultants, evaluation of patient's response to treatment, examination of patient, obtaining history from patient or surrogate, ordering and performing treatments and interventions, ordering and review of laboratory studies, pulse oximetry, re-evaluation of patient's condition and review of old charts.    (including critical care time) Labs Review Labs Reviewed  BASIC METABOLIC PANEL - Abnormal; Notable for the following:    Potassium 3.4 (*)    Glucose, Bld 114 (*)    All other components within normal limits  CBC WITH DIFFERENTIAL  TROPONIN I   EKG - a fib, rate 159, normal axis, RBBB, nonspecific ST/T changes, compared to prior, a fib now replaced sinus brady, rate increased.   EKG 2 - sinus rhythm, rate 87, normal axis, RBBB, similar to EKGs prior to today.   No results found.  MDM   1. Atrial  fibrillation with RVR    57 yo female presenting with recurrent A fib with RVR.  Her rhythm converted to sinus after IV diltiazem bolus and drip.  Cardiology consulted.  Dr. Ihor Gully evaluated and felt that she was appropriate for discharge given the recurrent nature of her a fib and resolution of dysrhythmia in ED.  Propafenone was restarted under his direction. She will follow up with Cardiology.      Candyce Churn, MD 02/08/13 1126

## 2013-02-07 NOTE — ED Notes (Signed)
Pt. Ambulated to bathroom with no problem. HR stayed below 100. Upon returning 81 BPM. Pt. Denies dizziness/lightheadedness or pain.

## 2013-02-08 DIAGNOSIS — I4891 Unspecified atrial fibrillation: Secondary | ICD-10-CM

## 2013-02-08 MED ORDER — PROPAFENONE HCL 225 MG PO TABS
225.0000 mg | ORAL_TABLET | Freq: Once | ORAL | Status: AC
  Administered 2013-02-08: 225 mg via ORAL
  Filled 2013-02-08: qty 1

## 2013-02-08 MED ORDER — PROPAFENONE HCL 225 MG PO TABS: 225.0000 mg | ORAL_TABLET | Freq: Two times a day (BID) | ORAL | Status: DC

## 2013-02-08 NOTE — Consult Note (Signed)
Reason for Consult: atrial fibrillation Referring Physician: Denajah Farias is an 57 y.o. female.  HPI:  Angela Cummings is a 57 year old female with a history of atrial fibrillation that is paroxysmal. She had a pulmonary vein isolation about 3 months ago. She was taken off of her flecainide and propafenone at that time. She continued to take the propafenone until about 1 week ago. She presents today to the ED with racing heart rate and palpitations. In the ED she was found to be in atrial fibrillation with RVR. She was given a bolus of cardizem. She converted to sinus rhythm. She is on a NOAC, xarelto which she is compliant with. She feels much better now that she is in sinus rhythm. She has no structural heart disease or coronary artery disease.   Past Medical History  Diagnosis Date  . Atrial fibrillation 11/2010    paroxysmal  . Bradycardia   . DJD (degenerative joint disease)   . Obesity   . Mild sleep apnea   . Asthma   . Anxiety   . Depression     Past Surgical History  Procedure Laterality Date  . Cholecystectomy    . Cesarean section    . Knee surgery    . Neck tumor resection      benign  . Hernia repair    . Tee without cardioversion  03/13/2012    Procedure: TRANSESOPHAGEAL ECHOCARDIOGRAM (TEE);  Surgeon: Vesta Mixer, MD;  Location: [facility] ENDOSCOPY;  Service: Cardiovascular;  Laterality: N/A;  Rm 2029  . Cardioversion  03/13/2012    Procedure: CARDIOVERSION;  Surgeon: Vesta Mixer, MD;  Location: [facility] ENDOSCOPY;  Service: Cardiovascular;  Laterality: N/A;  . Cardioversion N/A 07/26/2012    Procedure: CARDIOVERSION;  Surgeon: Hillis Range, MD;  Location: [facility] OR;  Service: Cardiovascular;  Laterality: N/A;  . Tee without cardioversion N/A 09/02/2012    Procedure: TRANSESOPHAGEAL ECHOCARDIOGRAM (TEE);  Surgeon: Wendall Stade, MD;  Location: [facility] ENDOSCOPY;  Service: Cardiovascular;  Laterality: N/A;    Family History  Problem Relation Age of Onset  . Diabetes  Other   . Thyroid disease Other   . Emphysema Mother   . Cancer Father   . Cancer Sister   . Emphysema Brother   . Cancer Daughter     Social History:  reports that she has never smoked. She has never used smokeless tobacco. She reports that she does not drink alcohol or use illicit drugs.  Allergies:  Allergies  Allergen Reactions  . Adhesive [Tape] Itching and Rash    Medications: I have reviewed the patient's current medications.  Results for orders placed during the hospital encounter of 02/07/13 (from the past 48 hour(s))  CBC WITH DIFFERENTIAL     Status: None   Collection Time    02/07/13 10:20 PM      Result Value Range   WBC 9.2  4.0 - 10.5 K/uL   RBC 4.30  3.87 - 5.11 MIL/uL   Hemoglobin 12.3  12.0 - 15.0 g/dL   HCT 45.4  09.8 - 11.9 %   MCV 85.3  78.0 - 100.0 fL   MCH 28.6  26.0 - 34.0 pg   MCHC 33.5  30.0 - 36.0 g/dL   RDW 14.7  82.9 - 56.2 %   Platelets 207  150 - 400 K/uL   Neutrophils Relative % 57  43 - 77 %   Neutro Abs 5.2  1.7 - 7.7 K/uL   Lymphocytes Relative  30  12 - 46 %   Lymphs Abs 2.7  0.7 - 4.0 K/uL   Monocytes Relative 11  3 - 12 %   Monocytes Absolute 1.0  0.1 - 1.0 K/uL   Eosinophils Relative 2  0 - 5 %   Eosinophils Absolute 0.2  0.0 - 0.7 K/uL   Basophils Relative 0  0 - 1 %   Basophils Absolute 0.0  0.0 - 0.1 K/uL  BASIC METABOLIC PANEL     Status: Abnormal   Collection Time    02/07/13 10:20 PM      Result Value Range   Sodium 139  135 - 145 mEq/L   Potassium 3.4 (*) 3.5 - 5.1 mEq/L   Chloride 105  96 - 112 mEq/L   CO2 22  19 - 32 mEq/L   Glucose, Bld 114 (*) 70 - 99 mg/dL   BUN 12  6 - 23 mg/dL   Creatinine, Ser 1.61  0.50 - 1.10 mg/dL   Calcium 8.6  8.4 - 09.6 mg/dL   GFR calc non Af Amer >90  >90 mL/min   GFR calc Af Amer >90  >90 mL/min   Comment: (NOTE)     The eGFR has been calculated using the CKD EPI equation.     This calculation has not been validated in all clinical situations.     eGFR's persistently <90 mL/min  signify possible Chronic Kidney     Disease.  TROPONIN I     Status: None   Collection Time    02/07/13 10:20 PM      Result Value Range   Troponin I <0.30  <0.30 ng/mL   Comment:            Due to the release kinetics of cTnI,     a negative result within the first hours     of the onset of symptoms does not rule out     myocardial infarction with certainty.     If myocardial infarction is still suspected,     repeat the test at appropriate intervals.    No results found.  Review of Systems  Constitutional: Negative.   HENT: Negative.   Eyes: Negative.   Cardiovascular: Positive for palpitations.  Gastrointestinal: Negative.   Genitourinary: Negative.   Musculoskeletal: Negative.   Skin: Negative.   Neurological: Negative.   Endo/Heme/Allergies: Negative.   Psychiatric/Behavioral: Negative.   All other systems reviewed and are negative.   Blood pressure 120/69, pulse 85, resp. rate 17, height 5\' 1"  (1.549 m), weight 220 lb (99.791 kg), SpO2 99.00%. Physical Exam  Constitutional: She appears well-developed and well-nourished.  Neck: No JVD present.  Cardiovascular: Normal rate, regular rhythm and normal heart sounds.   Respiratory: Effort normal and breath sounds normal.  GI: Soft. Bowel sounds are normal.   ECG: shows sinus rhythm with a RBBB which is unchanged from prior.    Assessment/Plan: Angela Gregory is a pleasant 5 female with a history of atrial fibrillation. She has recently had a pulmonary ablation. This was back in July. She did well after her ablation. She gives a different timeline of events than I can put together from her chart. She tells me that she stopped taking her propafenone about 1 week ago and now is having more paroxysms of AF. She is not interested in admission. She currently is anti-coagulated and already on a beta-blocker. It is reasonable to have her go back on her propafenone 225mg  BID and continue her xarelto and  nadolol. Given her lack of  structural heart disease, and normal AV conduction, it is reasonable to initiate this as an outpatient. She should remain on the xarelto and nadolol.   Ihor Gully, Sugey Trevathan C 02/08/2013, 12:22 AM

## 2013-02-21 ENCOUNTER — Other Ambulatory Visit: Payer: Self-pay | Admitting: Internal Medicine

## 2013-02-21 NOTE — Telephone Encounter (Signed)
Dr. [name] took me off propafenone at last vist.  After one day being off med, was admitted back into the hospital and med was stared back.  Now out of med.  Need refilled for propafenone and Xarelto.

## 2013-02-23 ENCOUNTER — Other Ambulatory Visit: Payer: Self-pay | Admitting: Internal Medicine

## 2013-03-06 ENCOUNTER — Other Ambulatory Visit: Payer: Self-pay | Admitting: Physician Assistant

## 2013-03-09 ENCOUNTER — Ambulatory Visit (INDEPENDENT_AMBULATORY_CARE_PROVIDER_SITE_OTHER): Payer: 59 | Admitting: Emergency Medicine

## 2013-03-09 VITALS — BP 122/84 | HR 70 | Temp 98.4°F | Resp 16 | Ht 60.5 in | Wt 222.0 lb

## 2013-03-09 DIAGNOSIS — Z23 Encounter for immunization: Secondary | ICD-10-CM

## 2013-03-09 DIAGNOSIS — I4891 Unspecified atrial fibrillation: Secondary | ICD-10-CM

## 2013-03-09 DIAGNOSIS — M130 Polyarthritis, unspecified: Secondary | ICD-10-CM

## 2013-03-09 MED ORDER — CELECOXIB 200 MG PO CAPS: 200.0000 mg | ORAL_CAPSULE | Freq: Every day | ORAL | Status: DC

## 2013-03-09 NOTE — Progress Notes (Signed)
[facility] [address] [place] [address] [phone]  Date:  03/09/2013   Name:  Angela Cummings   DOB:  [birth year]   MRN:  [id number]  PCP:  Tally Due, MD    Chief Complaint: Medication Refill   History of Present Illness:  Angela Cummings is a 57 y.o. very pleasant female patient who presents with the following:   Has arthritis under treatment with celebrex and has run out of medication.  Has increasing joint pain.  History of A Fib under treatment with xarelto.  Has undergone one ablation and is pending another.  Denies any unusual bleeding or bruising.  No black or bloody stools.  No nausea or vomiting. No indigestion or heartburn.  No improvement with over the counter medications or other home remedies. Denies other complaint or health concern today.   Patient Active Problem List   Diagnosis Date Noted  . Hypokalemia 07/26/2012  . Atrial fibrillation with RVR 03/09/2012  . Arthritis 03/09/2012  . Asthma   . Atrial fibrillation 11/28/2010  . Obesity 11/28/2010    Past Medical History  Diagnosis Date  . Atrial fibrillation 11/2010    paroxysmal  . Bradycardia   . DJD (degenerative joint disease)   . Obesity   . Mild sleep apnea   . Asthma   . Anxiety   . Depression     Past Surgical History  Procedure Laterality Date  . Cholecystectomy    . Cesarean section    . Knee surgery    . Neck tumor resection      benign  . Hernia repair    . Tee without cardioversion  03/13/2012    Procedure: TRANSESOPHAGEAL ECHOCARDIOGRAM (TEE);  Surgeon: Vesta Mixer, MD;  Location: [facility] ENDOSCOPY;  Service: Cardiovascular;  Laterality: N/A;  Rm 2029  . Cardioversion  03/13/2012    Procedure: CARDIOVERSION;  Surgeon: Vesta Mixer, MD;  Location: [facility] ENDOSCOPY;  Service: Cardiovascular;  Laterality: N/A;  . Cardioversion N/A 07/26/2012    Procedure: CARDIOVERSION;  Surgeon: Hillis Range, MD;  Location: [facility] OR;  Service: Cardiovascular;   Laterality: N/A;  . Tee without cardioversion N/A 09/02/2012    Procedure: TRANSESOPHAGEAL ECHOCARDIOGRAM (TEE);  Surgeon: Wendall Stade, MD;  Location: [facility] ENDOSCOPY;  Service: Cardiovascular;  Laterality: N/A;    History  Substance Use Topics  . Smoking status: Never Smoker   . Smokeless tobacco: Never Used  . Alcohol Use: No     Comment: Rarely    Family History  Problem Relation Age of Onset  . Diabetes Other   . Thyroid disease Other   . Emphysema Mother   . Cancer Father   . Cancer Sister   . Emphysema Brother   . Cancer Daughter     Allergies  Allergen Reactions  . Adhesive [Tape] Itching and Rash    Medication list has been reviewed and updated.  Current Outpatient Prescriptions on File Prior to Visit  Medication Sig Dispense Refill  . aspirin 81 MG tablet Take 81 mg by mouth daily.       . nadolol (CORGARD) 20 MG tablet Take 10 mg by mouth daily.      Carlena Hurl 20 MG TABS tablet TAKE 1 TABLET BY MOUTH DAILY  30 tablet  9  . celecoxib (CELEBREX) 200 MG capsule Take 1 capsule (200 mg total) by mouth daily. NEED VISIT!  30 capsule  0  . propafenone (RYTHMOL) 225 MG tablet Take 1  tablet (225 mg total) by mouth 2 (two) times daily.  30 tablet  0   No current facility-administered medications on file prior to visit.    Review of Systems:  As per HPI, otherwise negative.    Physical Examination: Filed Vitals:   03/09/13 0954  BP: 122/84  Pulse: 70  Temp: 98.4 F (36.9 C)  Resp: 16   Filed Vitals:   03/09/13 0954  Height: 5' 0.5" (1.537 m)  Weight: 222 lb (100.699 kg)   Body mass index is 42.63 kg/(m^2). Ideal Body Weight: Weight in (lb) to have BMI = 25: 129.9   GEN: WDWN, NAD, Non-toxic, Alert & Oriented x 3 HEENT: Atraumatic, Normocephalic.  Ears and Nose: No external deformity. EXTR: No clubbing/cyanosis/edema NEURO: Normal gait.  PSYCH: Normally interactive. Conversant. Not depressed or anxious appearing.  Calm demeanor.    Assessment and  Plan: polyarthralgias Afib Refill celebrex   Signed,  Phillips Odor, MD

## 2013-03-09 NOTE — Progress Notes (Deleted)
  Subjective:    Patient ID: Angela Cummings, female    DOB: 1955/09/17, 57 y.o.   MRN: [id number]  HPI    Review of Systems     Objective:   Physical Exam        Assessment & Plan:

## 2013-03-09 NOTE — Addendum Note (Signed)
Addended by: Eulah Pont on: 03/09/2013 10:32 AM   Modules accepted: Orders

## 2013-03-20 ENCOUNTER — Encounter (HOSPITAL_COMMUNITY): Payer: Self-pay | Admitting: Emergency Medicine

## 2013-03-20 ENCOUNTER — Emergency Department (HOSPITAL_COMMUNITY)
Admission: EM | Admit: 2013-03-20 | Discharge: 2013-03-20 | Disposition: A | Discharge status: Home / Self Care | Payer: 59 | Attending: Emergency Medicine | Admitting: Emergency Medicine

## 2013-03-20 ENCOUNTER — Emergency Department (HOSPITAL_COMMUNITY): Payer: 59

## 2013-03-20 DIAGNOSIS — I4891 Unspecified atrial fibrillation: Secondary | ICD-10-CM | POA: Insufficient documentation

## 2013-03-20 DIAGNOSIS — F329 Major depressive disorder, single episode, unspecified: Secondary | ICD-10-CM | POA: Insufficient documentation

## 2013-03-20 DIAGNOSIS — Z7982 Long term (current) use of aspirin: Secondary | ICD-10-CM | POA: Insufficient documentation

## 2013-03-20 DIAGNOSIS — Z8739 Personal history of other diseases of the musculoskeletal system and connective tissue: Secondary | ICD-10-CM | POA: Insufficient documentation

## 2013-03-20 DIAGNOSIS — R0789 Other chest pain: Secondary | ICD-10-CM | POA: Insufficient documentation

## 2013-03-20 DIAGNOSIS — Z79899 Other long term (current) drug therapy: Secondary | ICD-10-CM | POA: Insufficient documentation

## 2013-03-20 DIAGNOSIS — F3289 Other specified depressive episodes: Secondary | ICD-10-CM | POA: Insufficient documentation

## 2013-03-20 DIAGNOSIS — E669 Obesity, unspecified: Secondary | ICD-10-CM | POA: Insufficient documentation

## 2013-03-20 DIAGNOSIS — J45901 Unspecified asthma with (acute) exacerbation: Secondary | ICD-10-CM | POA: Insufficient documentation

## 2013-03-20 DIAGNOSIS — F411 Generalized anxiety disorder: Secondary | ICD-10-CM | POA: Insufficient documentation

## 2013-03-20 DIAGNOSIS — R42 Dizziness and giddiness: Secondary | ICD-10-CM | POA: Insufficient documentation

## 2013-03-20 DIAGNOSIS — R002 Palpitations: Secondary | ICD-10-CM | POA: Insufficient documentation

## 2013-03-20 LAB — CBC WITH DIFFERENTIAL/PLATELET
Eosinophils Absolute: 0.1 10*3/uL (ref 0.0–0.7)
Eosinophils Relative: 2 % (ref 0–5)
HCT: 38.2 % (ref 36.0–46.0)
Hemoglobin: 12.8 g/dL (ref 12.0–15.0)
Lymphs Abs: 1.3 10*3/uL (ref 0.7–4.0)
MCH: 29 pg (ref 26.0–34.0)
MCV: 86.6 fL (ref 78.0–100.0)
Monocytes Absolute: 0.7 10*3/uL (ref 0.1–1.0)
Monocytes Relative: 10 % (ref 3–12)
Platelets: 233 10*3/uL (ref 150–400)
RBC: 4.41 MIL/uL (ref 3.87–5.11)

## 2013-03-20 LAB — BASIC METABOLIC PANEL
BUN: 11 mg/dL (ref 6–23)
CO2: 27 mEq/L (ref 19–32)
Calcium: 8.8 mg/dL (ref 8.4–10.5)
Creatinine, Ser: 0.77 mg/dL (ref 0.50–1.10)
GFR calc non Af Amer: >90 mL/min (ref >90)
Glucose, Bld: 100 mg/dL — ABNORMAL HIGH (ref 70–99)

## 2013-03-20 MED ORDER — PROPAFENONE HCL 225 MG PO TABS: 225.0000 mg | ORAL_TABLET | Freq: Two times a day (BID) | ORAL | Status: DC

## 2013-03-20 NOTE — ED Notes (Signed)
Pt reports sudden onset of dizziness and rapid HR at appx  1230 today. Hx A fix, has not been taking rx dt PCP recommendations. On EMS arrival, pt HR 200. Given 20 Cardizem, decreasing HR. Pt initially reported right arm pain/jaw pain, but denies any complaint at this time. Pt in SR on monitor. AO x4.

## 2013-03-20 NOTE — ED Notes (Addendum)
[facility] from home. Hx of afib, PT call to EMS for heart racing. EMS arrived, AFIB RVR 200's. Cardizem 20mg  given, converted immediately to NSR 100's. Ablation by Allred MD May/14. [facility] follows. Left sided CP present after conversion. PT is CP free at this time. NO SOB. PT states that Allred MD took PT off Rythmol recently. ASA 324mg  given prior to arrival.

## 2013-03-20 NOTE — ED Provider Notes (Signed)
CSN: [id number]     Arrival date & time 03/20/13  1303 History   First MD Initiated Contact with Patient 03/20/13 1304     Chief Complaint  Patient presents with  . Atrial Fibrillation  . Chest Pain   (Consider location/radiation/quality/duration/timing/severity/associated sxs/prior Treatment) Patient is a 57 y.o. female presenting with palpitations. The history is provided by the patient and medical records.  Palpitations Palpitations quality:  Irregular Onset quality:  Sudden Duration:  30 minutes Timing:  Constant Progression:  Resolved Chronicity:  Recurrent Context: not stimulant use   Relieved by: Diltiazem bolus from EMS. Worsened by:  Nothing tried Ineffective treatments:  None tried Associated symptoms: chest pain, chest pressure, dizziness and shortness of breath   Associated symptoms: no back pain, no cough, no leg pain, no numbness, no syncope and no weakness     Past Medical History  Diagnosis Date  . Atrial fibrillation 11/2010    paroxysmal  . Bradycardia   . DJD (degenerative joint disease)   . Obesity   . Mild sleep apnea   . Asthma   . Anxiety   . Depression    Past Surgical History  Procedure Laterality Date  . Cholecystectomy    . Cesarean section    . Knee surgery    . Neck tumor resection      benign  . Hernia repair    . Tee without cardioversion  03/13/2012    Procedure: TRANSESOPHAGEAL ECHOCARDIOGRAM (TEE);  Surgeon: Vesta Mixer, MD;  Location: [facility] ENDOSCOPY;  Service: Cardiovascular;  Laterality: N/A;  Rm 2029  . Cardioversion  03/13/2012    Procedure: CARDIOVERSION;  Surgeon: Vesta Mixer, MD;  Location: [facility] ENDOSCOPY;  Service: Cardiovascular;  Laterality: N/A;  . Cardioversion N/A 07/26/2012    Procedure: CARDIOVERSION;  Surgeon: Hillis Range, MD;  Location: [facility] OR;  Service: Cardiovascular;  Laterality: N/A;  . Tee without cardioversion N/A 09/02/2012    Procedure: TRANSESOPHAGEAL ECHOCARDIOGRAM (TEE);  Surgeon: Wendall Stade, MD;   Location: [facility] ENDOSCOPY;  Service: Cardiovascular;  Laterality: N/A;   Family History  Problem Relation Age of Onset  . Diabetes Other   . Thyroid disease Other   . Emphysema Mother   . Cancer Father   . Cancer Sister   . Emphysema Brother   . Cancer Daughter    History  Substance Use Topics  . Smoking status: Never Smoker   . Smokeless tobacco: Never Used  . Alcohol Use: No     Comment: Rarely   OB History   Grav Para Term Preterm Abortions TAB SAB Ect Mult Living                 Review of Systems  Constitutional: Negative for fever, activity change and appetite change.  Respiratory: Positive for shortness of breath. Negative for cough.   Cardiovascular: Positive for chest pain and palpitations. Negative for syncope.  Musculoskeletal: Negative for back pain.  Skin: Negative for color change, pallor, rash and wound.  Neurological: Positive for dizziness. Negative for numbness.  All other systems reviewed and are negative.    Allergies  Adhesive  Home Medications   Current Outpatient Rx  Name  Route  Sig  Dispense  Refill  . albuterol (PROVENTIL HFA;VENTOLIN HFA) 108 (90 BASE) MCG/ACT inhaler   Inhalation   Inhale 2 puffs into the lungs every 6 (six) hours as needed for wheezing.         Marland Kitchen aspirin 81 MG tablet   Oral  Take 81 mg by mouth daily.          . celecoxib (CELEBREX) 200 MG capsule   Oral   Take 200 mg by mouth daily.         . nadolol (CORGARD) 20 MG tablet   Oral   Take 10 mg by mouth daily.         . Rivaroxaban (XARELTO) 20 MG TABS tablet   Oral   Take 20 mg by mouth daily with supper.         . propafenone (RYTHMOL) 225 MG tablet   Oral   Take 1 tablet (225 mg total) by mouth 2 (two) times daily.   60 tablet   0    BP 105/61  Pulse 58  Temp(Src) 97.7 F (36.5 C) (Oral)  Resp 16  Ht 5\' 1"  (1.549 m)  Wt 228 lb (103.42 kg)  BMI 43.1 kg/m2  SpO2 97% Physical Exam  Nursing note and vitals reviewed. Constitutional: She  is oriented to person, place, and time. She appears well-developed and well-nourished. No distress.  Pleasant female in NAD  HENT:  Head: Normocephalic and atraumatic.  Mouth/Throat: Oropharynx is clear and moist. No oropharyngeal exudate.  Eyes: Conjunctivae and EOM are normal. Pupils are equal, round, and reactive to light.  Neck: Normal range of motion. Neck supple.  Cardiovascular: Normal rate, regular rhythm and normal heart sounds.  Exam reveals no gallop and no friction rub.   No murmur heard. Pulmonary/Chest: Effort normal and breath sounds normal. No respiratory distress. She has no wheezes. She has no rales. She exhibits no tenderness.  Abdominal: Soft. She exhibits no distension. There is no tenderness.  Obese  Musculoskeletal: Normal range of motion. She exhibits no edema and no tenderness.  Lymphadenopathy:    She has no cervical adenopathy.  Neurological: She is alert and oriented to person, place, and time.  Skin: Skin is warm and dry. No rash noted. She is not diaphoretic.  Psychiatric: She has a normal mood and affect. Her behavior is normal. Judgment and thought content normal.    ED Course  Procedures (including critical care time) Labs Review Labs Reviewed  BASIC METABOLIC PANEL - Abnormal; Notable for the following:    Glucose, Bld 100 (*)    All other components within normal limits  CBC WITH DIFFERENTIAL  TROPONIN I   Imaging Review Dg Chest 2 View  03/20/2013   CLINICAL DATA:  Atrial fibrillation. Mid chest pain.  EXAM: CHEST  2 VIEW  COMPARISON:  CHEST x-ray 07/25/2012.  FINDINGS: Lung volumes are normal. No consolidative airspace disease. No pleural effusions. No pneumothorax. No pulmonary nodule or mass noted. Pulmonary vasculature and the cardiomediastinal silhouette are within normal limits. Old healed right 3rd rib fracture laterally again noted. Small calcified nodule projecting over the right middle lobe is unchanged, compatible with a calcified  granuloma. Multiple surgical clips project over the right upper quadrant of the abdomen, likely from prior cholecystectomy.  IMPRESSION: 1.  No radiographic evidence of acute cardiopulmonary disease. 2. Calcified granuloma in the right middle lobe unchanged.   Electronically Signed   By: Trudie Reed M.D.   On: 03/20/2013 14:06    EKG Interpretation   None       Date: 03/20/2013  Rate: 99  Rhythm: normal sinus rhythm  QRS Axis: normal  Intervals: normal  ST/T Wave abnormalities: normal  Conduction Disutrbances:R R' abnormality  Narrative Interpretation:   Old EKG Reviewed: unchanged   MDM  1. Atrial fibrillation with RVR     The patient is a 57 year old female with a history of atrial fibrillation, obesity who presents with A. fib with RVR, shortness of breath, palpitations, lightheadedness. Patient was standing at home doing nothing exertional, at which point she had acute onset of the symptoms approximately one hour ago. Symptoms resolved after EMS arrived, she was tachycardic to around 200. They gave 20 mg of diltiazem push which converted her to a sinus rhythm. Symptoms resolved following medication. She is currently asymptomatic on arrival to the emergency department. Heart rate on my exam in the 90s.  Patient has had ablation in the past and has intermittently been on Rythmol. She states that she has been taken off of it multiple times in the last few months, each time after being taken off she has episodes of RVR. She most recently was taken off 2 weeks ago. Evaluation with EKG, chest x-ray, blood work. Presentation not consistent with ACS, PE, dissection.  EEG shows normal sinus rhythm. No ST changes. No signs of atrial fibrillation. Chest x-ray without consolidation, effusion, pneumothorax, widened mediastinum. Initial troponin negative. Normal electrolytes. No anemia, no leukocytosis. [facility] consult cardiology for recommendations as to outpatient management.  Attempted to  contact cardiology multiple times as well as paging Dr. Johney Frame directly, and was unable to get in touch with her provider. Based on that, as well as previous success with rythmol, will restart until able to follow up with Cardiology. Instructed to call tomorrow in order to schedule follow up and determine best medical regimen. Stable for d/c.  Discussed with the patient return precautions and need for follow up with Cards. Patient voiced understanding. Stable for d/c. This patient was discussed with my attending, Dr. Lynelle Doctor.   Dorna Leitz, MD 03/20/13 [phone]

## 2013-03-20 NOTE — ED Provider Notes (Signed)
I saw and evaluated the patient, reviewed the resident's note and I agree with the findings and plan. I reviewed and interpreted the EKG during the patient's evaluation in the ED and agree with the resident's interpretation.  Pt with history of recurrent a fib.  Resolved after cardizem by EMS.   Will check labs, monitor.  Will DIscuss with her cardiologist about starting her rhythmol   Celene Kras, MD 03/20/13 1441

## 2013-03-21 ENCOUNTER — Telehealth: Payer: Self-pay | Admitting: *Deleted

## 2013-03-21 MED ORDER — PROPAFENONE HCL 225 MG PO TABS: 225.0000 mg | ORAL_TABLET | Freq: Two times a day (BID) | ORAL | Status: DC

## 2013-03-21 NOTE — Telephone Encounter (Signed)
Patient called stating that she was put back on Rythmol in the hospital yesterday despite Dr. Johney Frame stopping it in July. The day after she stopped she felt awful. She stated that this is the third time she has been put back on it. She has a 30 day supply on hand, but wants to know if Dr. Johney Frame will ok it to be filled up until her ov. If possible she requested to be seen sooner. Please advise. Thanks, MI

## 2013-03-21 NOTE — Telephone Encounter (Signed)
It is in the ER notes  Yes ok to give her enough until she has follow up

## 2013-04-03 ENCOUNTER — Other Ambulatory Visit: Payer: Self-pay | Admitting: Internal Medicine

## 2013-04-22 ENCOUNTER — Other Ambulatory Visit: Payer: Self-pay

## 2013-04-22 MED ORDER — PROPAFENONE HCL 225 MG PO TABS: 225.0000 mg | ORAL_TABLET | Freq: Two times a day (BID) | ORAL | Status: DC

## 2013-05-07 ENCOUNTER — Encounter: Payer: Self-pay | Admitting: Internal Medicine

## 2013-05-07 ENCOUNTER — Ambulatory Visit (INDEPENDENT_AMBULATORY_CARE_PROVIDER_SITE_OTHER): Payer: 59 | Admitting: Internal Medicine

## 2013-05-07 VITALS — BP 130/76 | HR 59 | Ht 61.0 in | Wt 229.8 lb

## 2013-05-07 DIAGNOSIS — E669 Obesity, unspecified: Secondary | ICD-10-CM

## 2013-05-07 DIAGNOSIS — I4891 Unspecified atrial fibrillation: Secondary | ICD-10-CM

## 2013-05-07 DIAGNOSIS — R002 Palpitations: Secondary | ICD-10-CM

## 2013-05-07 DIAGNOSIS — R0602 Shortness of breath: Secondary | ICD-10-CM

## 2013-05-07 DIAGNOSIS — R079 Chest pain, unspecified: Secondary | ICD-10-CM

## 2013-05-07 MED ORDER — DILTIAZEM HCL 30 MG PO TABS: 30.0000 mg | ORAL_TABLET | Freq: Four times a day (QID) | ORAL | Status: DC

## 2013-05-07 MED ORDER — DILTIAZEM HCL ER COATED BEADS 180 MG PO CP24: 180.0000 mg | ORAL_CAPSULE | Freq: Every day | ORAL | Status: DC

## 2013-05-07 NOTE — Patient Instructions (Addendum)
Your physician recommends that you schedule a follow-up appointment in: 3 months with Dr Johney Frame  Your physician has requested that you have a lexiscan myoview. For further information please visit [email or web address] Please follow instruction sheet, as given.  Your physician has recommended you make the following change in your medication:  1) Start Cardizem CD 180 mg daily 2) Take Cardizem 30 mg every 6 hours as needed for palpitations 3) Stop Nadolol

## 2013-05-08 DIAGNOSIS — R079 Chest pain, unspecified: Secondary | ICD-10-CM | POA: Insufficient documentation

## 2013-05-08 DIAGNOSIS — R0602 Shortness of breath: Secondary | ICD-10-CM | POA: Insufficient documentation

## 2013-05-08 NOTE — Progress Notes (Signed)
PCP:  Tally Due, MD Primary Cardiologist: Dr Jacinto Halim  The patient presents today for routine electrophysiology followup.  She has recently had episodic palpitations associated with recurrent afib.  These episodes occur every 3-4 weeks associated with tachypalpitations and chest pain.  She also has SOB with episodes. Today, she denies symptoms of orthopnea, PND, lower extremity edema, dizziness, presyncope, syncope, or neurologic sequela.  The patient feels that she is tolerating medications without difficulties and is otherwise without complaint today.   Past Medical History  Diagnosis Date  . Atrial fibrillation 11/2010    paroxysmal  . Bradycardia   . DJD (degenerative joint disease)   . Obesity   . Mild sleep apnea   . Asthma   . Anxiety   . Depression    Past Surgical History  Procedure Laterality Date  . Cholecystectomy    . Cesarean section    . Knee surgery    . Neck tumor resection      benign  . Hernia repair    . Tee without cardioversion  03/13/2012    Procedure: TRANSESOPHAGEAL ECHOCARDIOGRAM (TEE);  Surgeon: Vesta Mixer, MD;  Location: [facility] ENDOSCOPY;  Service: Cardiovascular;  Laterality: N/A;  Rm 2029  . Cardioversion  03/13/2012    Procedure: CARDIOVERSION;  Surgeon: Vesta Mixer, MD;  Location: [facility] ENDOSCOPY;  Service: Cardiovascular;  Laterality: N/A;  . Cardioversion N/A 07/26/2012    Procedure: CARDIOVERSION;  Surgeon: Hillis Range, MD;  Location: [facility] OR;  Service: Cardiovascular;  Laterality: N/A;  . Tee without cardioversion N/A 09/02/2012    Procedure: TRANSESOPHAGEAL ECHOCARDIOGRAM (TEE);  Surgeon: Wendall Stade, MD;  Location: [facility] ENDOSCOPY;  Service: Cardiovascular;  Laterality: N/A;    Current Outpatient Prescriptions  Medication Sig Dispense Refill  . albuterol (PROVENTIL HFA;VENTOLIN HFA) 108 (90 BASE) MCG/ACT inhaler Inhale 2 puffs into the lungs every 6 (six) hours as needed for wheezing.      Marland Kitchen aspirin 81 MG tablet Take 81 mg by mouth  daily.       . propafenone (RYTHMOL) 225 MG tablet Take 1 tablet (225 mg total) by mouth 2 (two) times daily.  60 tablet  0  . Rivaroxaban (XARELTO) 20 MG TABS tablet Take 20 mg by mouth daily with supper.      . diltiazem (CARDIZEM CD) 180 MG 24 hr capsule Take 1 capsule (180 mg total) by mouth daily.  90 capsule  3  . diltiazem (CARDIZEM) 30 MG tablet Take 1 tablet (30 mg total) by mouth 4 (four) times daily.  30 tablet  3   No current facility-administered medications for this visit.    Allergies  Allergen Reactions  . Adhesive [Tape] Itching and Rash    History   Social History  . Marital Status: Married    Spouse Name: N/A    Number of Children: 3  . Years of Education: N/A   Occupational History  . Not on file.   Social History Main Topics  . Smoking status: Never Smoker   . Smokeless tobacco: Never Used  . Alcohol Use: No     Comment: Rarely  . Drug Use: No  . Sexual Activity: Yes   Other Topics Concern  . Not on file   Social History Narrative   Married with 3 children (17,30, 44) and granddaughter (14).   Lives in [place]. Unemployed.    Family History  Problem Relation Age of Onset  . Diabetes Other   . Thyroid disease Other   .  Emphysema Mother   . Cancer Father   . Cancer Sister   . Emphysema Brother   . Cancer Daughter     ROS-  All systems are reviewed and are negative except as outlined in the HPI above  Physical Exam: Filed Vitals:   05/07/13 1226  BP: 130/76  Pulse: 59  Height: 5\' 1"  (1.549 m)  Weight: 229 lb 12.8 oz (104.237 kg)    GEN- The patient is well appearing, alert and oriented x 3 today.   Head- normocephalic, atraumatic Eyes-  Sclera clear, conjunctiva pink Ears- hearing intact Oropharynx- clear Neck- supple, no JVP Lymph- no cervical lymphadenopathy Lungs- Clear to ausculation bilaterally, normal work of breathing Heart- Regular rate and rhythm, no murmurs, rubs or gallops, PMI not laterally displaced GI- soft,  NT, ND, + BS Extremities- no clubbing, cyanosis, or edema MS- no significant deformity or atrophy Skin- no rash or lesion Psych- euthymic mood, full affect Neuro- strength and sensation are intact  ekg today reveals sinus rhythm 59 bpm, PR 188 , RBBB  Assessment and Plan:  1. afib She reports fatigue with nadolol.  She has found that in the ER, she quickly converts with cardizem. I will therefore stop nadolol and start diltiazem CD.  She is also given short acting diltiazem to take as needed for tachypalpitations  2. Chest pain and SOB lexiscan myoview is ordered for further CV risk stratification.  3. Obesity Weight loss is advised  Return in 3 months

## 2013-05-28 ENCOUNTER — Ambulatory Visit (HOSPITAL_COMMUNITY): Payer: 59 | Attending: Cardiology | Admitting: Radiology

## 2013-05-28 ENCOUNTER — Encounter (HOSPITAL_COMMUNITY): Payer: 59

## 2013-05-28 ENCOUNTER — Encounter: Payer: Self-pay | Admitting: Cardiology

## 2013-05-28 VITALS — BP 130/82 | Ht 61.0 in | Wt 226.0 lb

## 2013-05-28 DIAGNOSIS — R002 Palpitations: Secondary | ICD-10-CM | POA: Insufficient documentation

## 2013-05-28 DIAGNOSIS — R42 Dizziness and giddiness: Secondary | ICD-10-CM | POA: Insufficient documentation

## 2013-05-28 DIAGNOSIS — I4891 Unspecified atrial fibrillation: Secondary | ICD-10-CM

## 2013-05-28 DIAGNOSIS — R0602 Shortness of breath: Secondary | ICD-10-CM | POA: Insufficient documentation

## 2013-05-28 DIAGNOSIS — R079 Chest pain, unspecified: Secondary | ICD-10-CM | POA: Insufficient documentation

## 2013-05-28 MED ORDER — REGADENOSON 0.4 MG/5ML IV SOLN
0.4000 mg | Freq: Once | INTRAVENOUS | Status: AC
  Administered 2013-05-28: 0.4 mg via INTRAVENOUS

## 2013-05-28 MED ORDER — TECHNETIUM TC 99M SESTAMIBI GENERIC - CARDIOLITE
33.0000 | Freq: Once | INTRAVENOUS | Status: AC | PRN
  Administered 2013-05-28: 33 via INTRAVENOUS

## 2013-05-28 NOTE — Progress Notes (Signed)
[facility] SITE 3 NUCLEAR MED [address] [place], [place] [address] [phone]    Cardiology Nuclear Med Study  [name] is a 57 y.o. female     MRN : [id number]     DOB: 10-11-1955  Procedure Date: 05/28/2013  Nuclear Med Background Indication for Stress Test:  Evaluation for Ischemia  History:  2011 MPI: NL '13 ECHO: EF: 50-55% '14 AFIB Cardioversion Cardiac Risk Factors: RBBB  Symptoms:  Chest Pain, Dizziness, Palpitations and SOB   Nuclear Pre-Procedure Caffeine/Decaff Intake:  None NPO After: 7:00pm   Lungs:  clear O2 Sat: 96% on room air. IV 0.9% NS with Angio Cath:  22g  IV Site: R Antecubital  IV Started by:  Milana Na, EMT-P  Chest Size (in):  42 Cup Size: DD  Height: 5\' 1"  (1.549 m)  Weight:  226 lb (102.513 kg)  BMI:  Body mass index is 42.72 kg/(m^2). Tech Comments:  Rx this am    Nuclear Med Study 1 or 2 day study: 2 day  Stress Test Type:  Lexiscan  Reading MD: Maisie Fus Adiva Boettner,M.D. Order Authorizing Provider:  J.Allred MD  Resting Radionuclide: Technetium 28m Sestamibi  Resting Radionuclide Dose: 33.0 mCi   On       06-02-13   Stress Radionuclide:  Technetium 9m Sestamibi  Stress Radionuclide Dose: 33.0 mCi    On         05-28-13          Stress Protocol Rest HR: 60 Stress HR: 82  Rest BP: 130/82 Stress BP: 132/58  Exercise Time (min): n/a METS: n/a   Predicted Max HR: 161 bpm % Max HR: 50.93 bpm Rate Pressure Product: 21308   Dose of Adenosine (mg):  n/a Dose of Lexiscan: 0.4 mg  Dose of Atropine (mg): n/a Dose of Dobutamine: n/a mcg/kg/min (at max HR)  Stress Test Technologist: Milana Na, EMT-P  Nuclear Technologist:  Harlow Asa, CNMT     Rest Procedure:  Myocardial perfusion imaging was performed at rest 45 minutes following the intravenous administration of Technetium 65m Sestamibi. Rest ECG: NSR-RBBB  Stress Procedure:  The patient received IV Lexiscan 0.4 mg over 15-seconds.  Technetium 47m  Sestamibi injected at 30-seconds. This patient had sob and felt weird with the Lexiscan injection. Quantitative spect images were obtained after a 45 minute delay. Stress ECG: No significant change from baseline ECG  QPS Raw Data Images:  Normal; no motion artifact; normal heart/lung ratio. Stress Images:  Normal homogeneous uptake in all areas of the myocardium. Rest Images:  Normal homogeneous uptake in all areas of the myocardium. Subtraction (SDS):  No evidence of ischemia. Transient Ischemic Dilatation (Normal <1.22):  1.13 Lung/Heart Ratio (Normal <0.45):  0.34  Quantitative Gated Spect Images QGS EDV:  128 ml QGS ESV:  61 ml  Impression Exercise Capacity:  Lexiscan with no exercise. BP Response:  Normal blood pressure response. Clinical Symptoms:  No chest pain. ECG Impression:  No significant ST segment change suggestive of ischemia. Comparison with Prior Nuclear Study: No images to compare  Overall Impression:  Normal stress nuclear study.  LV Ejection Fraction: 52%.  LV Wall Motion:  NL LV Function; NL Wall Motion   [employer]

## 2013-06-02 ENCOUNTER — Ambulatory Visit (HOSPITAL_COMMUNITY): Payer: 59 | Attending: Internal Medicine

## 2013-06-02 DIAGNOSIS — R0989 Other specified symptoms and signs involving the circulatory and respiratory systems: Secondary | ICD-10-CM

## 2013-06-02 MED ORDER — TECHNETIUM TC 99M SESTAMIBI GENERIC - CARDIOLITE
30.0000 | Freq: Once | INTRAVENOUS | Status: AC | PRN
  Administered 2013-06-02: 30 via INTRAVENOUS

## 2013-06-10 ENCOUNTER — Other Ambulatory Visit: Payer: Self-pay | Admitting: Internal Medicine

## 2013-06-23 ENCOUNTER — Telehealth: Payer: Self-pay | Admitting: Internal Medicine

## 2013-06-23 NOTE — Telephone Encounter (Signed)
Pt aware that her stress test is normal

## 2013-06-23 NOTE — Telephone Encounter (Signed)
New message     Returning a nurses call from a couple days ago.

## 2013-07-17 ENCOUNTER — Ambulatory Visit: Payer: 59

## 2013-07-17 ENCOUNTER — Ambulatory Visit (INDEPENDENT_AMBULATORY_CARE_PROVIDER_SITE_OTHER): Payer: 59 | Admitting: Emergency Medicine

## 2013-07-17 VITALS — BP 118/80 | HR 67 | Temp 97.6°F | Resp 16 | Ht 61.0 in | Wt 231.0 lb

## 2013-07-17 DIAGNOSIS — S9030XA Contusion of unspecified foot, initial encounter: Secondary | ICD-10-CM

## 2013-07-17 DIAGNOSIS — M25579 Pain in unspecified ankle and joints of unspecified foot: Secondary | ICD-10-CM

## 2013-07-17 MED ORDER — TRAMADOL HCL 50 MG PO TABS: 50.0000 mg | ORAL_TABLET | Freq: Four times a day (QID) | ORAL | Status: DC | PRN

## 2013-07-17 MED ORDER — CELECOXIB 200 MG PO CAPS: 200.0000 mg | ORAL_CAPSULE | Freq: Two times a day (BID) | ORAL | Status: DC

## 2013-07-17 NOTE — Progress Notes (Addendum)
[facility] [address], [place] [address] [phone]  Date:  07/17/2013   Name:  Angela Cummings   DOB:  04-14-1956   MRN:  [id number]  PCP:  Kennon Portela, MD    Chief Complaint: Foot Pain and Medication Refill   History of Present Illness:  Angela Cummings is a 58 y.o. very pleasant female patient who presents with the following:  Dropped a five pound frozen chicken on her right forefoot.  Pain with standing and ambulation.  Some swelling and very tender. No improvement with over the counter medications or other home remedies. Denies other complaint or health concern today.   Patient Active Problem List   Diagnosis Date Noted  . Chest pain 05/08/2013  . Shortness of breath 05/08/2013  . Hypokalemia 07/26/2012  . Atrial fibrillation with RVR 03/09/2012  . Arthritis 03/09/2012  . Asthma   . Atrial fibrillation 11/28/2010  . Obesity 11/28/2010    Past Medical History  Diagnosis Date  . Atrial fibrillation 11/2010    paroxysmal  . Bradycardia   . DJD (degenerative joint disease)   . Obesity   . Mild sleep apnea   . Asthma   . Anxiety   . Depression     Past Surgical History  Procedure Laterality Date  . Cholecystectomy    . Cesarean section    . Knee surgery    . Neck tumor resection      benign  . Hernia repair    . Tee without cardioversion  03/13/2012    Procedure: TRANSESOPHAGEAL ECHOCARDIOGRAM (TEE);  Surgeon: [name], MD;  Location: [place];  Service: Cardiovascular;  Laterality: N/A;  Rm 2029  . Cardioversion  03/13/2012    Procedure: CARDIOVERSION;  Surgeon: [name], MD;  Location: [place];  Service: Cardiovascular;  Laterality: N/A;  . Cardioversion N/A 07/26/2012    Procedure: CARDIOVERSION;  Surgeon: Thompson Grayer, MD;  Location: [place];  Service: Cardiovascular;  Laterality: N/A;  . Tee without cardioversion N/A 09/02/2012    Procedure: TRANSESOPHAGEAL ECHOCARDIOGRAM (TEE);  Surgeon: Josue Hector, MD;  Location: [facility] ENDOSCOPY;  Service: Cardiovascular;  Laterality: N/A;    History  Substance Use Topics  . Smoking status: Never Smoker   . Smokeless tobacco: Never Used  . Alcohol Use: No     Comment: Rarely    Family History  Problem Relation Age of Onset  . Diabetes Other   . Thyroid disease Other   . Emphysema Mother   . Cancer Father   . Cancer Sister   . Emphysema Brother   . Cancer Daughter     Allergies  Allergen Reactions  . Adhesive [Tape] Itching and Rash    Medication list has been reviewed and updated.  Current Outpatient Prescriptions on File Prior to Visit  Medication Sig Dispense Refill  . albuterol (PROVENTIL HFA;VENTOLIN HFA) 108 (90 BASE) MCG/ACT inhaler Inhale 2 puffs into the lungs every 6 (six) hours as needed for wheezing.      Marland Kitchen aspirin 81 MG tablet Take 81 mg by mouth daily.       Marland Kitchen diltiazem (CARDIZEM CD) 180 MG 24 hr capsule Take 1 capsule (180 mg total) by mouth daily.  90 capsule  3  . diltiazem (CARDIZEM) 30 MG tablet Take 1 tablet (30 mg total) by mouth 4 (four) times daily.  30 tablet  3  . propafenone (RYTHMOL) 225 MG tablet TAKE 1 TABLET BY MOUTH TWICE  DAILY  60 tablet  6  . Rivaroxaban (XARELTO) 20 MG TABS tablet Take 20 mg by mouth daily with supper.       No current facility-administered medications on file prior to visit.    Review of Systems:  As per HPI, otherwise negative.    Physical Examination: Filed Vitals:   07/17/13 1319  BP: 118/80  Pulse: 67  Temp: 97.6 F (36.4 C)  Resp: 16   Filed Vitals:   07/17/13 1319  Height: 5\' 1"  (1.549 m)  Weight: 231 lb (104.781 kg)   Body mass index is 43.67 kg/(m^2). Ideal Body Weight: Weight in (lb) to have BMI = 25: 132   GEN: WDWN, NAD, Non-toxic, Alert & Oriented x 3 HEENT: Atraumatic, Normocephalic.  Ears and Nose: No external deformity. EXTR: No clubbing/cyanosis/edema NEURO: Normal gait.  PSYCH: Normally interactive. Conversant. Not depressed or anxious  appearing.  Calm demeanor.  RIGHT foot:  Tender metatarsal proximal to great toe.  No deformity.  Assessment and Plan: Contusion foot Ultram Cast shoe RICE  Signed,  Ellison Carwin, MD   [facility] reading (PRIMARY) by  Dr. Ouida Sills. Negative foot.

## 2013-07-17 NOTE — Patient Instructions (Signed)
Foot Contusion °A foot contusion is a deep bruise to the foot. Contusions are the result of an injury that caused bleeding under the skin. The contusion may turn blue, purple, or yellow. Minor injuries will give you a painless contusion, but more severe contusions may stay painful and swollen for a few weeks. °CAUSES  °A foot contusion comes from a direct blow to that area, such as a heavy object falling on the foot. °SYMPTOMS  °· Swelling of the foot. °· Discoloration of the foot. °· Tenderness or soreness of the foot. °DIAGNOSIS  °You will have a physical exam and will be asked about your history. You may need an X-ray of your foot to look for a broken bone (fracture).  °TREATMENT  °An elastic wrap may be recommended to support your foot. Resting, elevating, and applying cold compresses to your foot are often the best treatments for a foot contusion. Over-the-counter medicines may also be recommended for pain control. °HOME CARE INSTRUCTIONS  °· Put ice on the injured area. °· Put ice in a plastic bag. °· Place a towel between your skin and the bag. °· Leave the ice on for 15-20 minutes, 03-04 times a day. °· Only take over-the-counter or prescription medicines for pain, discomfort, or fever as directed by your caregiver. °· If told, use an elastic wrap as directed. This can help reduce swelling. You may remove the wrap for sleeping, showering, and bathing. If your toes become numb, cold, or blue, take the wrap off and reapply it more loosely. °· Elevate your foot with pillows to reduce swelling. °· Try to avoid standing or walking while the foot is painful. Do not resume use until instructed by your caregiver. Then, begin use gradually. If pain develops, decrease use. Gradually increase activities that do not cause discomfort until you have normal use of your foot. °· See your caregiver as directed. It is very important to keep all follow-up appointments in order to avoid any lasting problems with your foot,  including long-term (chronic) pain. °SEEK IMMEDIATE MEDICAL CARE IF:  °· You have increased redness, swelling, or pain in your foot. °· Your swelling or pain is not relieved with medicines. °· You have loss of feeling in your foot or are unable to move your toes. °· Your foot turns cold or blue. °· You have pain when you move your toes. °· Your foot becomes warm to the touch. °· Your contusion does not improve in 2 days. °MAKE SURE YOU:  °· Understand these instructions. °· Will watch your condition. °· Will get help right away if you are not doing well or get worse. °Document Released: 03/20/2006 Document Revised: 11/28/2011 Document Reviewed: 05/02/2011 °Exit[employer]® Patient Information ©2014 [employer]. ° °

## 2013-08-08 ENCOUNTER — Ambulatory Visit: Payer: 59 | Admitting: Internal Medicine

## 2013-09-08 ENCOUNTER — Ambulatory Visit (INDEPENDENT_AMBULATORY_CARE_PROVIDER_SITE_OTHER): Payer: 59 | Admitting: Family Medicine

## 2013-09-08 VITALS — BP 122/76 | HR 60 | Temp 97.8°F | Resp 18 | Ht 61.5 in | Wt 230.4 lb

## 2013-09-08 DIAGNOSIS — R358 Other polyuria: Secondary | ICD-10-CM

## 2013-09-08 DIAGNOSIS — C4491 Basal cell carcinoma of skin, unspecified: Secondary | ICD-10-CM

## 2013-09-08 DIAGNOSIS — R3589 Other polyuria: Secondary | ICD-10-CM

## 2013-09-08 DIAGNOSIS — M545 Low back pain, unspecified: Secondary | ICD-10-CM

## 2013-09-08 LAB — POCT UA - MICROSCOPIC ONLY
Bacteria, U Microscopic: NEGATIVE
Casts, Ur, LPF, POC: NEGATIVE
Crystals, Ur, HPF, POC: NEGATIVE
Epithelial cells, urine per micros: NEGATIVE
Mucus, UA: POSITIVE
WBC, Ur, HPF, POC: NEGATIVE
Yeast, UA: NEGATIVE

## 2013-09-08 LAB — POCT URINALYSIS DIPSTICK
Bilirubin, UA: NEGATIVE
Glucose, UA: NEGATIVE
Ketones, UA: NEGATIVE
Leukocytes, UA: NEGATIVE
Nitrite, UA: NEGATIVE
Protein, UA: NEGATIVE
Spec Grav, UA: 1.015
Urobilinogen, UA: 1
pH, UA: 5.5

## 2013-09-08 MED ORDER — PREDNISONE 20 MG PO TABS
40.0000 mg | ORAL_TABLET | Freq: Every day | ORAL | Status: DC
Start: 2013-09-08 — End: 2013-10-09

## 2013-09-08 MED ORDER — CYCLOBENZAPRINE HCL 5 MG PO TABS: 5.0000 mg | ORAL_TABLET | Freq: Three times a day (TID) | ORAL | Status: DC | PRN

## 2013-09-08 MED ORDER — TRAMADOL HCL 50 MG PO TABS: 50.0000 mg | ORAL_TABLET | Freq: Three times a day (TID) | ORAL | Status: DC | PRN

## 2013-09-08 NOTE — Patient Instructions (Signed)

## 2013-09-08 NOTE — Progress Notes (Signed)
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 She was feeding him last week and when bending over experienced a sudden spasm in her right lower back. She's had back pain ever since.  The patient denies any radiation of the pain. She's had some polyuria overnight but no loss of bladder control. She has no fever.  Patient also notes a new skin lesion on the tip of her nose which was once treated but has come back and is now hyperpigmented.  Objective: Very pleasant woman in no acute distress with an obese body habitus. Inspection of her back reveals no scoliosis Palpation of the back reveals some tenderness in the right lower paraspinal region Straight-leg raising is mildly positive on the right.  Results for orders placed in visit on 09/08/13  POCT URINALYSIS DIPSTICK      Result Value Ref Range   Color, UA yellow     Clarity, UA clear     Glucose, UA neg     Bilirubin, UA neg     Ketones, UA neg     Spec Grav, UA 1.015     Blood, UA small     pH, UA 5.5     Protein, UA neg     Urobilinogen, UA 1.0     Nitrite, UA neg     Leukocytes, UA Negative    POCT UA - MICROSCOPIC ONLY      Result Value Ref Range   WBC, Ur, HPF, POC neg     RBC, urine, microscopic 0-1     Bacteria, U Microscopic neg     Mucus, UA pos     Epithelial cells, urine per micros neg     Crystals, Ur, HPF, POC neg     Casts, Ur, LPF, POC neg     Yeast, UA neg     Lumbago - Plan: POCT urinalysis dipstick, POCT UA - Microscopic Only, predniSONE (DELTASONE) 20 MG tablet, cyclobenzaprine (FLEXERIL) 5 MG tablet, traMADol (ULTRAM) 50 MG tablet  Polyuria - Plan: POCT urinalysis dipstick, POCT UA - Microscopic Only  Basal cell carcinoma - Plan: Ambulatory referral to Dermatology  Signed, Robyn Haber, MD

## 2013-10-09 ENCOUNTER — Encounter: Payer: Self-pay | Admitting: Internal Medicine

## 2013-10-09 ENCOUNTER — Ambulatory Visit (INDEPENDENT_AMBULATORY_CARE_PROVIDER_SITE_OTHER): Payer: 59 | Admitting: Internal Medicine

## 2013-10-09 VITALS — BP 132/78 | HR 64 | Ht 61.5 in | Wt 231.0 lb

## 2013-10-09 DIAGNOSIS — I4891 Unspecified atrial fibrillation: Secondary | ICD-10-CM

## 2013-10-09 NOTE — Patient Instructions (Addendum)
Your physician has recommended that you have an ablation. Catheter ablation is a medical procedure used to treat some cardiac arrhythmias (irregular heartbeats). During catheter ablation, a long, thin, flexible tube is put into a blood vessel in your groin (upper thigh), or neck. This tube is called an ablation catheter. It is then guided to your heart through the blood vessel. Radio frequency waves destroy small areas of heart tissue where abnormal heartbeats may cause an arrhythmia to start. Please see the instruction sheet given to you today.---11/13/13  Plan to be at the hospital early that morning   Your physician recommends that you return for lab work on 11/06/13  Your physician has requested that you have cardiac CT. Cardiac computed tomography (CT) is a painless test that uses an x-ray machine to take clear, detailed pictures of your heart. For further information please visit [employer]. Please follow instruction sheet as given.---to be done a few weeks prior to the ablation    Your physician has recommended you make the following change in your medication:  1) Stop Aspirin

## 2013-10-09 NOTE — Progress Notes (Signed)
PCP:  Kennon Portela, MD Primary Cardiologist: Dr Einar Gip  The patient presents today for routine electrophysiology followup.  She continues to have episodes of afib with RVR.  She has chest and arm pain associated with these.  She also has SOB.  She has been unable to lose weight. Today, she denies symptoms of orthopnea, PND, lower extremity edema, dizziness, presyncope, syncope, or neurologic sequela.  The patient feels that she is tolerating medications without difficulties and is otherwise without complaint today.   Past Medical History  Diagnosis Date  . Atrial fibrillation 11/2010    paroxysmal  . Bradycardia   . DJD (degenerative joint disease)   . Obesity   . Mild sleep apnea   . Asthma   . Anxiety   . Depression    Past Surgical History  Procedure Laterality Date  . Cholecystectomy    . Cesarean section    . Knee surgery    . Neck tumor resection      benign  . Hernia repair    . Tee without cardioversion  03/13/2012    Procedure: TRANSESOPHAGEAL ECHOCARDIOGRAM (TEE);  Surgeon: [name], MD;  Location: [place];  Service: Cardiovascular;  Laterality: N/A;  Rm 2029  . Cardioversion  03/13/2012    Procedure: CARDIOVERSION;  Surgeon: [name], MD;  Location: [place];  Service: Cardiovascular;  Laterality: N/A;  . Cardioversion N/A 07/26/2012    Procedure: CARDIOVERSION;  Surgeon: Thompson Grayer, MD;  Location: [place];  Service: Cardiovascular;  Laterality: N/A;  . Tee without cardioversion N/A 09/02/2012    Procedure: TRANSESOPHAGEAL ECHOCARDIOGRAM (TEE);  Surgeon: Josue Hector, MD;  Location: [facility] ENDOSCOPY;  Service: Cardiovascular;  Laterality: N/A;  . Ablation  08/2012    PVI by Dr [name]    Current Outpatient Prescriptions  Medication Sig Dispense Refill  . albuterol (PROVENTIL HFA;VENTOLIN HFA) 108 (90 BASE) MCG/ACT inhaler Inhale 2 puffs into the lungs every 6 (six) hours as needed for wheezing.      Marland Kitchen aspirin 81 MG tablet Take 81 mg by mouth  daily.       . celecoxib (CELEBREX) 200 MG capsule Take 1 capsule (200 mg total) by mouth 2 (two) times daily.  90 capsule  3  . cyclobenzaprine (FLEXERIL) 5 MG tablet Take 1 tablet (5 mg total) by mouth 3 (three) times daily as needed for muscle spasms.  30 tablet  1  . diltiazem (CARDIZEM CD) 180 MG 24 hr capsule Take 1 capsule (180 mg total) by mouth daily.  90 capsule  3  . diltiazem (CARDIZEM) 30 MG tablet Take 30 mg by mouth 4 (four) times daily as needed.      . propafenone (RYTHMOL) 225 MG tablet TAKE 1 TABLET BY MOUTH TWICE DAILY  60 tablet  6  . Rivaroxaban (XARELTO) 20 MG TABS tablet Take 20 mg by mouth daily with supper.      . traMADol (ULTRAM) 50 MG tablet Take 1 tablet (50 mg total) by mouth every 8 (eight) hours as needed.  30 tablet  0   No current facility-administered medications for this visit.    Allergies  Allergen Reactions  . Adhesive [Tape] Itching and Rash    History   Social History  . Marital Status: Married    Spouse Name: N/A    Number of Children: 3  . Years of Education: N/A   Occupational History  . Not on file.   Social History Main Topics  . Smoking status:  Never Smoker   . Smokeless tobacco: Never Used  . Alcohol Use: No     Comment: Rarely  . Drug Use: No  . Sexual Activity: Yes   Other Topics Concern  . Not on file   Social History Narrative   Married with 3 children (17,30, 64) and granddaughter (40).   Lives in [place]. Unemployed.    Family History  Problem Relation Age of Onset  . Diabetes Other   . Thyroid disease Other   . Emphysema Mother   . Cancer Father   . Cancer Sister   . Emphysema Brother   . Cancer Daughter     ROS-  All systems are reviewed and are negative except as outlined in the HPI above  Physical Exam: Filed Vitals:   10/09/13 0940  BP: 132/78  Pulse: 64  Height: 5' 1.5" (1.562 m)  Weight: 231 lb (104.781 kg)    GEN- The patient is well appearing, alert and oriented x 3 today.   Head-  normocephalic, atraumatic Eyes-  Sclera clear, conjunctiva pink Ears- hearing intact Oropharynx- clear Neck- supple, no JVP Lymph- no cervical lymphadenopathy Lungs- Clear to ausculation bilaterally, normal work of breathing Heart- Regular rate and rhythm, no murmurs, rubs or gallops, PMI not laterally displaced GI- soft, NT, ND, + BS Extremities- no clubbing, cyanosis, or edema MS- no significant deformity or atrophy Skin- no rash or lesion Psych- euthymic mood, full affect Neuro- strength and sensation are intact  ekg today reveals sinus rhythm 64 bpm, PR 184, RBBB  Assessment and Plan:  1. afib She has failed medical therapy with propafenone and diltiazem. Therapeutic strategies for afib including medicine and repeat ablation were discussed in detail with the patient today. Risk, benefits, and alternatives to repeat EP study and radiofrequency ablation for afib were also discussed in detail today. These risks include but are not limited to stroke, bleeding, vascular damage, tamponade, perforation, damage to the esophagus, lungs, and other structures, pulmonary vein stenosis, worsening renal function, and death. The patient understands these risk and wishes to proceed.  We will therefore proceed with catheter ablation at the next available time.  2. Chest pain and SOB lexiscan is reviewed Will order cardiac CTA to evaluate pulmonary veins post prior ablation to exclude pulmonary vein stenosis prior to repeat procedure  3. Obesity Weight loss is advised

## 2013-10-27 ENCOUNTER — Encounter: Payer: Self-pay | Admitting: Internal Medicine

## 2013-11-04 ENCOUNTER — Encounter: Payer: Self-pay | Admitting: *Deleted

## 2013-11-04 ENCOUNTER — Other Ambulatory Visit (INDEPENDENT_AMBULATORY_CARE_PROVIDER_SITE_OTHER): Payer: 59

## 2013-11-04 DIAGNOSIS — I4891 Unspecified atrial fibrillation: Secondary | ICD-10-CM

## 2013-11-04 LAB — CBC WITH DIFFERENTIAL/PLATELET
BASOS PCT: 0.3 % (ref 0.0–3.0)
Basophils Absolute: 0 10*3/uL (ref 0.0–0.1)
EOS ABS: 0.2 10*3/uL (ref 0.0–0.7)
Eosinophils Relative: 2.3 % (ref 0.0–5.0)
HCT: 33.3 % — ABNORMAL LOW (ref 36.0–46.0)
Hemoglobin: 11 g/dL — ABNORMAL LOW (ref 12.0–15.0)
Lymphocytes Relative: 28.3 % (ref 12.0–46.0)
Lymphs Abs: 2.1 10*3/uL (ref 0.7–4.0)
MCHC: 32.9 g/dL (ref 30.0–36.0)
MCV: 87.3 fl (ref 78.0–100.0)
Monocytes Absolute: 0.7 10*3/uL (ref 0.1–1.0)
Monocytes Relative: 8.8 % (ref 3.0–12.0)
NEUTROS PCT: 60.3 % (ref 43.0–77.0)
Neutro Abs: 4.5 10*3/uL (ref 1.4–7.7)
Platelets: 262 10*3/uL (ref 150.0–400.0)
RBC: 3.82 Mil/uL — AB (ref 3.87–5.11)
RDW: 14.7 % (ref 11.5–15.5)
WBC: 7.4 10*3/uL (ref 4.0–10.5)

## 2013-11-04 LAB — BASIC METABOLIC PANEL
BUN: 12 mg/dL (ref 6–23)
CO2: 27 meq/L (ref 19–32)
CREATININE: 1 mg/dL (ref 0.4–1.2)
Calcium: 8.7 mg/dL (ref 8.4–10.5)
Chloride: 104 mEq/L (ref 96–112)
GFR: 62.2 mL/min (ref >60.00)
Glucose, Bld: 79 mg/dL (ref 70–99)
Potassium: 3.8 mEq/L (ref 3.5–5.1)
Sodium: 138 mEq/L (ref 135–145)

## 2013-11-05 ENCOUNTER — Ambulatory Visit (HOSPITAL_COMMUNITY)
Admission: RE | Admit: 2013-11-05 | Discharge: 2013-11-05 | Disposition: A | Discharge status: Home / Self Care | Payer: 59 | Source: Ambulatory Visit | Attending: Internal Medicine | Admitting: Internal Medicine

## 2013-11-05 DIAGNOSIS — R079 Chest pain, unspecified: Secondary | ICD-10-CM | POA: Insufficient documentation

## 2013-11-05 DIAGNOSIS — I4891 Unspecified atrial fibrillation: Secondary | ICD-10-CM | POA: Insufficient documentation

## 2013-11-05 MED ORDER — IOHEXOL 350 MG/ML SOLN
100.0000 mL | Freq: Once | INTRAVENOUS | Status: AC | PRN
  Administered 2013-11-05: 100 mL via INTRAVENOUS

## 2013-11-05 MED ORDER — NITROGLYCERIN 0.4 MG SL SUBL
0.4000 mg | SUBLINGUAL_TABLET | SUBLINGUAL | Status: DC | PRN
  Administered 2013-11-05: 0.4 mg via SUBLINGUAL
  Filled 2013-11-05: qty 25

## 2013-11-05 MED ORDER — NITROGLYCERIN 0.4 MG SL SUBL
SUBLINGUAL_TABLET | SUBLINGUAL | Status: AC
  Filled 2013-11-05: qty 1

## 2013-11-13 ENCOUNTER — Ambulatory Visit (HOSPITAL_COMMUNITY)
Admission: RE | Admit: 2013-11-13 | Discharge: 2013-11-14 | Disposition: A | Discharge status: Home / Self Care | Payer: 59 | Source: Ambulatory Visit | Attending: Internal Medicine | Admitting: Internal Medicine

## 2013-11-13 ENCOUNTER — Encounter (HOSPITAL_COMMUNITY)
Admission: RE | Disposition: A | Discharge status: Home / Self Care | Payer: Self-pay | Source: Ambulatory Visit | Attending: Internal Medicine

## 2013-11-13 ENCOUNTER — Ambulatory Visit (HOSPITAL_COMMUNITY): Payer: 59 | Admitting: Anesthesiology

## 2013-11-13 ENCOUNTER — Encounter (HOSPITAL_COMMUNITY): Payer: Self-pay | Admitting: Gastroenterology

## 2013-11-13 ENCOUNTER — Encounter (HOSPITAL_COMMUNITY): Payer: 59 | Admitting: Anesthesiology

## 2013-11-13 DIAGNOSIS — Z7901 Long term (current) use of anticoagulants: Secondary | ICD-10-CM | POA: Insufficient documentation

## 2013-11-13 DIAGNOSIS — G4730 Sleep apnea, unspecified: Secondary | ICD-10-CM | POA: Insufficient documentation

## 2013-11-13 DIAGNOSIS — Z79899 Other long term (current) drug therapy: Secondary | ICD-10-CM | POA: Insufficient documentation

## 2013-11-13 DIAGNOSIS — F411 Generalized anxiety disorder: Secondary | ICD-10-CM | POA: Insufficient documentation

## 2013-11-13 DIAGNOSIS — F3289 Other specified depressive episodes: Secondary | ICD-10-CM | POA: Insufficient documentation

## 2013-11-13 DIAGNOSIS — M1990 Unspecified osteoarthritis, unspecified site: Secondary | ICD-10-CM | POA: Insufficient documentation

## 2013-11-13 DIAGNOSIS — I4892 Unspecified atrial flutter: Secondary | ICD-10-CM

## 2013-11-13 DIAGNOSIS — I059 Rheumatic mitral valve disease, unspecified: Secondary | ICD-10-CM

## 2013-11-13 DIAGNOSIS — Z6841 Body Mass Index (BMI) 40.0 and over, adult: Secondary | ICD-10-CM | POA: Insufficient documentation

## 2013-11-13 DIAGNOSIS — I4891 Unspecified atrial fibrillation: Secondary | ICD-10-CM | POA: Insufficient documentation

## 2013-11-13 DIAGNOSIS — I480 Paroxysmal atrial fibrillation: Secondary | ICD-10-CM | POA: Diagnosis present

## 2013-11-13 DIAGNOSIS — J45909 Unspecified asthma, uncomplicated: Secondary | ICD-10-CM | POA: Insufficient documentation

## 2013-11-13 DIAGNOSIS — F329 Major depressive disorder, single episode, unspecified: Secondary | ICD-10-CM | POA: Insufficient documentation

## 2013-11-13 DIAGNOSIS — I498 Other specified cardiac arrhythmias: Secondary | ICD-10-CM | POA: Insufficient documentation

## 2013-11-13 HISTORY — PX: ATRIAL FIBRILLATION ABLATION: SHX5456

## 2013-11-13 HISTORY — PX: TEE WITHOUT CARDIOVERSION: SHX5443

## 2013-11-13 HISTORY — PX: ABLATION: SHX5711

## 2013-11-13 LAB — POCT ACTIVATED CLOTTING TIME
Activated Clotting Time: 221 seconds
Activated Clotting Time: 271 seconds
Activated Clotting Time: 277 seconds
Activated Clotting Time: 177 seconds

## 2013-11-13 LAB — MRSA PCR SCREENING: MRSA by PCR: NEGATIVE

## 2013-11-13 SURGERY — ATRIAL FIBRILLATION ABLATION
Anesthesia: Monitor Anesthesia Care

## 2013-11-13 SURGERY — ECHOCARDIOGRAM, TRANSESOPHAGEAL
Anesthesia: Moderate Sedation

## 2013-11-13 MED ORDER — OXYCODONE HCL 5 MG PO TABS
5.0000 mg | ORAL_TABLET | Freq: Once | ORAL | Status: AC | PRN
  Administered 2013-11-13: 5 mg via ORAL
  Filled 2013-11-13: qty 1

## 2013-11-13 MED ORDER — MIDAZOLAM HCL 5 MG/ML IJ SOLN
INTRAMUSCULAR | Status: AC
  Filled 2013-11-13: qty 2

## 2013-11-13 MED ORDER — HEPARIN SODIUM (PORCINE) 1000 UNIT/ML IJ SOLN
INTRAMUSCULAR | Status: AC
  Filled 2013-11-13: qty 1

## 2013-11-13 MED ORDER — LACTATED RINGERS IV SOLN
INTRAVENOUS | Status: DC | PRN
  Administered 2013-11-13: 10:00:00 via INTRAVENOUS

## 2013-11-13 MED ORDER — DILTIAZEM HCL ER COATED BEADS 180 MG PO CP24
180.0000 mg | ORAL_CAPSULE | Freq: Every day | ORAL | Status: DC
  Administered 2013-11-14: 180 mg via ORAL
  Filled 2013-11-13: qty 1

## 2013-11-13 MED ORDER — DOBUTAMINE IN D5W 4-5 MG/ML-% IV SOLN
INTRAVENOUS | Status: DC | PRN
  Administered 2013-11-13: 10 ug/kg/min via INTRAVENOUS

## 2013-11-13 MED ORDER — SODIUM CHLORIDE 0.9 % IJ SOLN: 3.0000 mL | INTRAMUSCULAR | Status: DC | PRN

## 2013-11-13 MED ORDER — BUTAMBEN-TETRACAINE-BENZOCAINE 2-2-14 % EX AERO
INHALATION_SPRAY | CUTANEOUS | Status: DC | PRN
  Administered 2013-11-13: 2 via TOPICAL

## 2013-11-13 MED ORDER — PROPOFOL INFUSION 10 MG/ML OPTIME
INTRAVENOUS | Status: DC | PRN
  Administered 2013-11-13: 200 ug/kg/min via INTRAVENOUS

## 2013-11-13 MED ORDER — LIDOCAINE HCL (CARDIAC) 20 MG/ML IV SOLN
INTRAVENOUS | Status: DC | PRN
  Administered 2013-11-13: 30 mg via INTRAVENOUS

## 2013-11-13 MED ORDER — FENTANYL CITRATE 0.05 MG/ML IJ SOLN: 25.0000 ug | INTRAMUSCULAR | Status: DC | PRN

## 2013-11-13 MED ORDER — HEPARIN SODIUM (PORCINE) 1000 UNIT/ML IJ SOLN
INTRAMUSCULAR | Status: DC | PRN
  Administered 2013-11-13: 3000 [IU] via INTRAVENOUS
  Administered 2013-11-13: 13000 [IU] via INTRAVENOUS
  Administered 2013-11-13: 5000 [IU] via INTRAVENOUS

## 2013-11-13 MED ORDER — PROPAFENONE HCL 225 MG PO TABS
225.0000 mg | ORAL_TABLET | Freq: Two times a day (BID) | ORAL | Status: DC
  Administered 2013-11-13 – 2013-11-14 (×2): 225 mg via ORAL
  Filled 2013-11-13 (×3): qty 1

## 2013-11-13 MED ORDER — FENTANYL CITRATE 0.05 MG/ML IJ SOLN
INTRAMUSCULAR | Status: AC
  Filled 2013-11-13: qty 2

## 2013-11-13 MED ORDER — RIVAROXABAN 20 MG PO TABS
20.0000 mg | ORAL_TABLET | Freq: Every day | ORAL | Status: DC
  Administered 2013-11-13: 20 mg via ORAL
  Filled 2013-11-13 (×2): qty 1

## 2013-11-13 MED ORDER — PROPOFOL 10 MG/ML IV BOLUS
INTRAVENOUS | Status: DC | PRN
  Administered 2013-11-13: 10 mg via INTRAVENOUS

## 2013-11-13 MED ORDER — FENTANYL CITRATE 0.05 MG/ML IJ SOLN
INTRAMUSCULAR | Status: DC | PRN
  Administered 2013-11-13 (×2): 50 ug via INTRAVENOUS
  Administered 2013-11-13: 25 ug via INTRAVENOUS

## 2013-11-13 MED ORDER — OXYCODONE HCL 5 MG/5ML PO SOLN: 5.0000 mg | Freq: Once | ORAL | Status: AC | PRN

## 2013-11-13 MED ORDER — PROPOFOL 10 MG/ML IV BOLUS: INTRAVENOUS | Status: DC | PRN

## 2013-11-13 MED ORDER — ALBUTEROL SULFATE (2.5 MG/3ML) 0.083% IN NEBU: 2.5000 mg | INHALATION_SOLUTION | Freq: Four times a day (QID) | RESPIRATORY_TRACT | Status: DC | PRN

## 2013-11-13 MED ORDER — ALBUTEROL SULFATE HFA 108 (90 BASE) MCG/ACT IN AERS: 1.0000 | INHALATION_SPRAY | Freq: Four times a day (QID) | RESPIRATORY_TRACT | Status: DC | PRN

## 2013-11-13 MED ORDER — SODIUM CHLORIDE 0.9 % IV SOLN
INTRAVENOUS | Status: DC
  Administered 2013-11-13: 09:00:00 via INTRAVENOUS
  Administered 2013-11-13: 500 mL via INTRAVENOUS

## 2013-11-13 MED ORDER — MIDAZOLAM HCL 10 MG/2ML IJ SOLN
INTRAMUSCULAR | Status: DC | PRN
  Administered 2013-11-13 (×2): 2 mg via INTRAVENOUS
  Administered 2013-11-13: 1 mg via INTRAVENOUS
  Administered 2013-11-13: 2 mg via INTRAVENOUS

## 2013-11-13 MED ORDER — FENTANYL CITRATE 0.05 MG/ML IJ SOLN
INTRAMUSCULAR | Status: DC | PRN
  Administered 2013-11-13 (×3): 25 ug via INTRAVENOUS

## 2013-11-13 MED ORDER — PROTAMINE SULFATE 10 MG/ML IV SOLN
INTRAVENOUS | Status: DC | PRN
  Administered 2013-11-13: 30 mg via INTRAVENOUS

## 2013-11-13 MED ORDER — BUPIVACAINE HCL (PF) 0.25 % IJ SOLN
INTRAMUSCULAR | Status: AC
  Filled 2013-11-13: qty 30

## 2013-11-13 MED ORDER — HYDROCODONE-ACETAMINOPHEN 5-325 MG PO TABS
1.0000 | ORAL_TABLET | ORAL | Status: DC | PRN
  Administered 2013-11-13 – 2013-11-14 (×2): 2 via ORAL
  Filled 2013-11-13 (×4): qty 2

## 2013-11-13 MED ORDER — SODIUM CHLORIDE 0.9 % IJ SOLN
3.0000 mL | Freq: Two times a day (BID) | INTRAMUSCULAR | Status: DC
  Administered 2013-11-13 – 2013-11-14 (×3): 3 mL via INTRAVENOUS

## 2013-11-13 MED ORDER — DOBUTAMINE IN D5W 4-5 MG/ML-% IV SOLN
INTRAVENOUS | Status: AC
  Filled 2013-11-13: qty 250

## 2013-11-13 MED ORDER — SODIUM CHLORIDE 0.9 % IV SOLN: 250.0000 mL | INTRAVENOUS | Status: DC | PRN

## 2013-11-13 NOTE — Progress Notes (Addendum)
Pt states that when she turned side to side to utilize the bedpan that she felt minimal pain. Reassessed pt's right femoral site - small amount of new blood noted on the gauze, pt rolled to her side and determined no blood on the pad.  Original gauze removed, pressure applied for 15 minutes, no bleeding or hematoma noted - redressed.  No c/o groin pain or discomfort per pt report.    Will continue to closely monitor.  12  Allred, MD notified.

## 2013-11-13 NOTE — Anesthesia Procedure Notes (Signed)
Procedure Name: MAC Date/Time: 11/13/2013 10:08 AM Performed by: Melina Copa, Yianni Skilling R Pre-anesthesia Checklist: Patient identified, Emergency Drugs available, Suction available, Patient being monitored and Timeout performed Patient Re-evaluated:Patient Re-evaluated prior to inductionOxygen Delivery Method: Simple face mask Preoxygenation: Pre-oxygenation with 100% oxygen Ventilation: Nasal airway inserted- appropriate to patient size Dental Injury: Teeth and Oropharynx as per pre-operative assessment

## 2013-11-13 NOTE — H&P (Signed)
PCP: Kennon Portela, MD  Primary Cardiologist: Dr Einar Gip  The patient presents today for routine electrophysiology followup. She continues to have episodes of afib with RVR. She has chest and arm pain associated with these. She also has SOB. She has been unable to lose weight. Today, she denies symptoms of orthopnea, PND, lower extremity edema, dizziness, presyncope, syncope, or neurologic sequela. The patient feels that she is tolerating medications without difficulties and is otherwise without complaint today.  Past Medical History   Diagnosis  Date   .  Atrial fibrillation  11/2010     paroxysmal   .  Bradycardia    .  DJD (degenerative joint disease)    .  Obesity    .  Mild sleep apnea    .  Asthma    .  Anxiety    .  Depression     Past Surgical History   Procedure  Laterality  Date   .  Cholecystectomy     .  Cesarean section     .  Knee surgery     .  Neck tumor resection       benign   .  Hernia repair     .  Tee without cardioversion   03/13/2012     Procedure: TRANSESOPHAGEAL ECHOCARDIOGRAM (TEE); Surgeon: [name], MD; Location: [place]; Service: Cardiovascular; Laterality: N/A; Rm 2029   .  Cardioversion   03/13/2012     Procedure: CARDIOVERSION; Surgeon: [name], MD; Location: [place]; Service: Cardiovascular; Laterality: N/A;   .  Cardioversion  N/A  07/26/2012     Procedure: CARDIOVERSION; Surgeon: Thompson Grayer, MD; Location: [place]; Service: Cardiovascular; Laterality: N/A;   .  Tee without cardioversion  N/A  09/02/2012     Procedure: TRANSESOPHAGEAL ECHOCARDIOGRAM (TEE); Surgeon: Josue Hector, MD; Location: [facility] ENDOSCOPY; Service: Cardiovascular; Laterality: N/A;   .  Ablation   08/2012     PVI by Dr [name]    Current Outpatient Prescriptions   Medication  Sig  Dispense  Refill   .  albuterol (PROVENTIL HFA;VENTOLIN HFA) 108 (90 BASE) MCG/ACT inhaler  Inhale 2 puffs into the lungs every 6 (six) hours as needed for wheezing.     Marland Kitchen  aspirin  81 MG tablet  Take 81 mg by mouth daily.     .  celecoxib (CELEBREX) 200 MG capsule  Take 1 capsule (200 mg total) by mouth 2 (two) times daily.  90 capsule  3   .  cyclobenzaprine (FLEXERIL) 5 MG tablet  Take 1 tablet (5 mg total) by mouth 3 (three) times daily as needed for muscle spasms.  30 tablet  1   .  diltiazem (CARDIZEM CD) 180 MG 24 hr capsule  Take 1 capsule (180 mg total) by mouth daily.  90 capsule  3   .  diltiazem (CARDIZEM) 30 MG tablet  Take 30 mg by mouth 4 (four) times daily as needed.     .  propafenone (RYTHMOL) 225 MG tablet  TAKE 1 TABLET BY MOUTH TWICE DAILY  60 tablet  6   .  Rivaroxaban (XARELTO) 20 MG TABS tablet  Take 20 mg by mouth daily with supper.     .  traMADol (ULTRAM) 50 MG tablet  Take 1 tablet (50 mg total) by mouth every 8 (eight) hours as needed.  30 tablet  0    No current facility-administered medications for this visit.    Allergies   Allergen  Reactions   .  Adhesive [Tape]  Itching and Rash    History    Social History   .  Marital Status:  Married     Spouse Name:  N/A     Number of Children:  3   .  Years of Education:  N/A    Occupational History   .  Not on file.    Social History Main Topics   .  Smoking status:  Never Smoker   .  Smokeless tobacco:  Never Used   .  Alcohol Use:  No      Comment: Rarely   .  Drug Use:  No   .  Sexual Activity:  Yes    Other Topics  Concern   .  Not on file    Social History Narrative    Married with 3 children (17,30, 48) and granddaughter (14).    Lives in [place]. Unemployed.    Family History   Problem  Relation  Age of Onset   .  Diabetes  Other    .  Thyroid disease  Other    .  Emphysema  Mother    .  Cancer  Father    .  Cancer  Sister    .  Emphysema  Brother    .  Cancer  Daughter    ROS- All systems are reviewed and are negative except as outlined in the HPI above  Physical Exam:  Filed Vitals:    10/09/13 0940   BP:  132/78   Pulse:  64   Height:  5' 1.5" (1.562 m)    Weight:  231 lb (104.781 kg)   GEN- The patient is well appearing, alert and oriented x 3 today.  Head- normocephalic, atraumatic  Eyes- Sclera clear, conjunctiva pink  Ears- hearing intact  Oropharynx- clear  Neck- supple, no JVP  Lymph- no cervical lymphadenopathy  Lungs- Clear to ausculation bilaterally, normal work of breathing  Heart- Regular rate and rhythm, no murmurs, rubs or gallops, PMI not laterally displaced  GI- soft, NT, ND, + BS  Extremities- no clubbing, cyanosis, or edema  MS- no significant deformity or atrophy  Skin- no rash or lesion  Psych- euthymic mood, full affect  Neuro- strength and sensation are intact  ekg today reveals sinus rhythm 64 bpm, PR 184, RBBB  Assessment and Plan:  1. afib  She has failed medical therapy with propafenone and diltiazem.  Therapeutic strategies for afib including medicine and repeat ablation were discussed in detail with the patient today. Risk, benefits, and alternatives to repeat EP study and radiofrequency ablation for afib were also discussed in detail today. These risks include but are not limited to stroke, bleeding, vascular damage, tamponade, perforation, damage to the esophagus, lungs, and other structures, pulmonary vein stenosis, worsening renal function, and death. The patient understands these risk and wishes to proceed. We will therefore proceed with catheter ablation at the next available time.  2. Chest pain and SOB  lexiscan is reviewed  Will order cardiac CTA to evaluate pulmonary veins post prior ablation to exclude pulmonary vein stenosis prior to repeat procedure  3. Obesity  Weight loss is advised  She is scheduled for a-fib ablation.  Needs TEE prior.  Discussed risks, benefits and optioins.  She understands and agrees to proceed.   [name], Brooke Bonito., MD, [facility] 11/13/2013, 8:18 AM [address],  [place] Pager [phone]

## 2013-11-13 NOTE — Anesthesia Preprocedure Evaluation (Signed)
Anesthesia Evaluation  Patient identified by MRN, date of birth, ID band Patient awake    Reviewed: Allergy & Precautions, H&P , NPO status , Patient's Chart, lab work & pertinent test results  History of Anesthesia Complications Negative for: history of anesthetic complications  Airway Mallampati: II TM Distance: >3 FB Neck ROM: Full    Dental  (+) Dental Advisory Given, Edentulous Upper   Pulmonary asthma , sleep apnea ,  breath sounds clear to auscultation        Cardiovascular + dysrhythmias Atrial Fibrillation Rhythm:Irregular Rate:Normal     Neuro/Psych PSYCHIATRIC DISORDERS    GI/Hepatic negative GI ROS, Neg liver ROS,   Endo/Other  Morbid obesity  Renal/GU      Musculoskeletal  (+) Arthritis -, Osteoarthritis,    Abdominal (+) + obese,   Peds  Hematology negative hematology ROS (+)   Anesthesia Other Findings   Reproductive/Obstetrics negative OB ROS                           Anesthesia Physical Anesthesia Plan  ASA: III  Anesthesia Plan: MAC   Post-op Pain Management:    Induction: Intravenous  Airway Management Planned: Natural Airway and Simple Face Mask  Additional Equipment:   Intra-op Plan:   Post-operative Plan:   Informed Consent: I have reviewed the patients History and Physical, chart, labs and discussed the procedure including the risks, benefits and alternatives for the proposed anesthesia with the patient or authorized representative who has indicated his/her understanding and acceptance.   Dental advisory given  Plan Discussed with: CRNA and Surgeon  Anesthesia Plan Comments:         Anesthesia Quick Evaluation

## 2013-11-13 NOTE — Progress Notes (Signed)
Pt has not taken Xarelto since Tuesday. Discussed with Amber / Dr. [name]. They will proceed with ablation as long as there are no thrombi.   [name], Brooke Bonito., MD, [facility] 11/13/2013, 8:28 AM [address]  [place] Pager [phone]

## 2013-11-13 NOTE — Anesthesia Postprocedure Evaluation (Signed)
  Anesthesia Post-op Note  Patient: Angela Cummings  Procedure(s) Performed: Procedure(s): ATRIAL FIBRILLATION ABLATION (N/A)  Patient Location: PACU  Anesthesia Type:General  Level of Consciousness: awake, alert  and oriented  Airway and Oxygen Therapy: Patient Spontanous Breathing  Post-op Pain: mild  Post-op Assessment: Post-op Vital signs reviewed  Post-op Vital Signs: Reviewed  Last Vitals:  Filed Vitals:   11/13/13 1830  BP: 151/79  Pulse: 64  Temp:   Resp: 13    Complications: No apparent anesthesia complications

## 2013-11-13 NOTE — H&P (Signed)
The patient presents today for repeat afib ablation. She continues to have episodes of afib with RVR. She has chest and arm pain associated with these. She also has SOB.  She reports compliance with anticoagulation. Today, she denies symptoms of orthopnea, PND, lower extremity edema, dizziness, presyncope, syncope, or neurologic sequela. The patient feels that she is tolerating medications without difficulties and is otherwise without complaint today.    Past Medical History   Diagnosis  Date   .  Atrial fibrillation  11/2010     paroxysmal   .  Bradycardia    .  DJD (degenerative joint disease)    .  Obesity    .  Mild sleep apnea    .  Asthma    .  Anxiety    .  Depression     Past Surgical History   Procedure  Laterality  Date   .  Cholecystectomy     .  Cesarean section     .  Knee surgery     .  Neck tumor resection       benign   .  Hernia repair     .  Tee without cardioversion   03/13/2012     Procedure: TRANSESOPHAGEAL ECHOCARDIOGRAM (TEE); Surgeon: [name], MD; Location: [place]; Service: Cardiovascular; Laterality: N/A; Rm 2029   .  Cardioversion   03/13/2012     Procedure: CARDIOVERSION; Surgeon: [name], MD; Location: [place]; Service: Cardiovascular; Laterality: N/A;   .  Cardioversion  N/A  07/26/2012     Procedure: CARDIOVERSION; Surgeon: Thompson Grayer, MD; Location: [place]; Service: Cardiovascular; Laterality: N/A;   .  Tee without cardioversion  N/A  09/02/2012     Procedure: TRANSESOPHAGEAL ECHOCARDIOGRAM (TEE); Surgeon: Josue Hector, MD; Location: [facility] ENDOSCOPY; Service: Cardiovascular; Laterality: N/A;   .  Ablation   08/2012     PVI by Dr [name]    Current Outpatient Prescriptions   Medication  Sig  Dispense  Refill   .  albuterol (PROVENTIL HFA;VENTOLIN HFA) 108 (90 BASE) MCG/ACT inhaler  Inhale 2 puffs into the lungs every 6 (six) hours as needed for wheezing.     Marland Kitchen  aspirin 81 MG tablet  Take 81 mg by mouth daily.     .  celecoxib  (CELEBREX) 200 MG capsule  Take 1 capsule (200 mg total) by mouth 2 (two) times daily.  90 capsule  3   .  cyclobenzaprine (FLEXERIL) 5 MG tablet  Take 1 tablet (5 mg total) by mouth 3 (three) times daily as needed for muscle spasms.  30 tablet  1   .  diltiazem (CARDIZEM CD) 180 MG 24 hr capsule  Take 1 capsule (180 mg total) by mouth daily.  90 capsule  3   .  diltiazem (CARDIZEM) 30 MG tablet  Take 30 mg by mouth 4 (four) times daily as needed.     .  propafenone (RYTHMOL) 225 MG tablet  TAKE 1 TABLET BY MOUTH TWICE DAILY  60 tablet  6   .  Rivaroxaban (XARELTO) 20 MG TABS tablet  Take 20 mg by mouth daily with supper.     .  traMADol (ULTRAM) 50 MG tablet  Take 1 tablet (50 mg total) by mouth every 8 (eight) hours as needed.  30 tablet  0    No current facility-administered medications for this visit.    Allergies   Allergen  Reactions   .  Adhesive [Tape]  Itching and  Rash    History    Social History   .  Marital Status:  Married     Spouse Name:  N/A     Number of Children:  3   .  Years of Education:  N/A    Occupational History   .  Not on file.    Social History Main Topics   .  Smoking status:  Never Smoker   .  Smokeless tobacco:  Never Used   .  Alcohol Use:  No      Comment: Rarely   .  Drug Use:  No   .  Sexual Activity:  Yes    Other Topics  Concern   .  Not on file    Social History Narrative    Married with 3 children (17,30, 25) and granddaughter (38).    Lives in [place]. Unemployed.    Family History   Problem  Relation  Age of Onset   .  Diabetes  Other    .  Thyroid disease  Other    .  Emphysema  Mother    .  Cancer  Father    .  Cancer  Sister    .  Emphysema  Brother    .  Cancer  Daughter    ROS- All systems are reviewed and are negative except as outlined in the HPI above   Physical Exam:  Filed Vitals:   11/13/13 0922  BP: 142/56  Pulse:   Temp:   Resp: 11   GEN- The patient is well appearing, alert and oriented x 3 today.   Head- normocephalic, atraumatic  Eyes- Sclera clear, conjunctiva pink  Ears- hearing intact  Oropharynx- clear  Neck- supple, no JVP  Lymph- no cervical lymphadenopathy  Lungs- Clear to ausculation bilaterally, normal work of breathing  Heart- Regular rate and rhythm, no murmurs, rubs or gallops, PMI not laterally displaced  GI- soft, NT, ND, + BS  Extremities- no clubbing, cyanosis, or edema  Neuro- strength and sensation are intact   Assessment and Plan:  1. afib  She has failed medical therapy with propafenone and diltiazem.  Therapeutic strategies for afib including medicine and ablation were discussed in detail with the patient today. Risk, benefits, and alternatives to repeat EP study and radiofrequency ablation for afib were also discussed in detail today. These risks include but are not limited to stroke, bleeding, vascular damage, tamponade, perforation, damage to the esophagus, lungs, and other structures, pulmonary vein stenosis, worsening renal function, and death. The patient understands these risk and wishes to proceed.

## 2013-11-13 NOTE — CV Procedure (Signed)
  Transesophageal Echocardiogram Note  Angela Cummings [id number] 09/29/1955  Procedure: Transesophageal Echocardiogram Indications: atrial fib  Procedure Details Consent: Obtained Time Out: Verified patient identification, verified procedure, site/side was marked, verified correct patient position, special equipment/implants available, Radiology Safety Procedures followed,  medications/allergies/relevent history reviewed, required imaging and test results available.  Performed  Medications: Fentanyl: 75 mcg iv Versed: 7 mg iv  Left Ventrical:  Normal LV functioin  Mitral Valve: mild MR  Aortic Valve: normal  Tricuspid Valve: trace TR  Pulmonic Valve: normal  Left Atrium/ Left atrial appendage:  No evidence of thrombus There is significant pectinate muscle visible in the tip of the LAA.  We have Definity contrast which helped define the LAA slightly better than without.  I reviewed the images with Dr. Nelson who also feels that there is no thrombus in the tip of the LAA but only pectinate muscle.  Atrial septum: intact  Aorta: normal   Complications: No apparent complications Patient did tolerate procedure well.   Angela Cummings, [name], MD, FACC 11/13/2013, 8:55 AM     

## 2013-11-13 NOTE — Op Note (Signed)
SURGEON:  Thompson Grayer, MD  PREPROCEDURE DIAGNOSES: 1. Paroxysmal atrial fibrillation. 2. Atypical atrial fluttter  POSTPROCEDURE DIAGNOSES: 1. Paroxysmal  atrial fibrillation. 2. Multiple left atrial flutter circuits  PROCEDURES: 1. Comprehensive electrophysiologic study. 2. Coronary sinus pacing and recording. 3. Three-dimensional mapping of atrial fibrillation with additional mapping and ablation of a second discrete focus 4. Ablation of atrial fibrillation with additional mapping and ablation of a second discrete focus 5. Intracardiac echocardiography. 6. Transseptal puncture of an intact septum. 7. Arrhythmia induction with pacing with dobutamine/ adenosine infusion 8. Cardioversion  INTRODUCTION:  Angela Cummings is a 58 y.o. female with a history of paroxysmal atrial fibrillation and atypical atrial flutter who now presents for EP study and radiofrequency ablation.  The patient reports initially being diagnosed with atrial fibrillation after presenting with symptomatic palpitations and fatgiue. The patient reports increasing frequency and duration of atrial fibrillation since that time. She underwent atrial fibrillation ablation by me 2014.  She did very well initially following ablation.  Unfortunately her afib has returned.  The patient therefore presents today for catheter ablation of atrial fibrillation.  DESCRIPTION OF PROCEDURE:  Informed written consent was obtained, and the patient was brought to the electrophysiology lab in a fasting state.  The patient was adequately sedated with intravenous medications as outlined in the anesthesia report.  The patient's left and right groins were prepped and draped in the usual sterile fashion by the EP lab staff.  Using a percutaneous Seldinger technique, two 7-French and one 11-French hemostasis sheaths were placed into the right common femoral vein.  Catheter Placement:  A 7-French Biosense [employer] Decapolar coronary sinus catheter  was introduced through the right common femoral vein and advanced into the coronary sinus for recording and pacing from this location.  A 6-French quadripolar Josephson catheter was introduced through the right common femoral vein and advanced into the right ventricle for recording and pacing.  This catheter was then pulled back to the His bundle location.    Initial Measurements: The patient presented to the electrophysiology lab in sinus rhythm.  Her PR interval measured 171 msec with a QRS duration of 77 msec and a QT interval of 451 msec.  The AH interval measured 81 msec and the HV interval measured 55 msec.     Intracardiac Echocardiography: A 10-French Biosense Webster AcuNav intracardiac echocardiography catheter was introduced through the left common femoral vein and advanced into the right atrium. Intracardiac echocardiography was performed of the left atrium, and a three-dimensional anatomical rendering of the left atrium was performed using CARTO sound technology.  The patient was noted to have a moderate sized left atrium.  The interatrial septum was prominent but not aneurysmal. All 4 pulmonary veins were visualized and noted to have separate ostia.  The superior pulmonary veins were large in size.  The inferior pulmonary veins were moderate in size.  The left atrial appendage was visualized and did not reveal thrombus.   There was no evidence of pulmonary vein stenosis.   Transseptal Puncture: The middle right common femoral vein sheath was exchanged for an 8.5 [place] SL2 transseptal sheath and transseptal access was achieved in a standard fashion using a Brockenbrough needle under biplane fluoroscopy with intracardiac echocardiography confirmation of the transseptal puncture.  Once transseptal access had been achieved, heparin was administered intravenously and intra- arterially in order to maintain an ACT of greater than 300 seconds throughout the procedure.   3D Mapping and  Ablation: The His bundle catheter was removed and in  its place a 3.5 mm Biosense [employer] ablation catheter was advanced into the right atrium.  The transseptal sheath was pulled back into the IVC over a guidewire.  The ablation catheter was advanced across the transseptal hole using the wire as a guide.  The transseptal sheath was then re-advanced over the guidewire into the left atrium.  A duodecapolar Biosense Webster circular mapping catheter was introduced through the transseptal sheath and positioned over the mouth of all 4 pulmonary veins.  Three-dimensional electroanatomical mapping was performed using CARTO technology. A previously rendered image of the left atrium obtained from a cardiac CT was merged using [employer].  The patient was found to have electrical activity within the right inferior and right superior pulmonary veins at baseline.  There was trivial conduction within the left superior pulmonary vein.  The left inferior pulmonary vein was quiescent from the prior procedure.  The patient underwent successful sequential electrical isolation of the right superior and inferior pulmonary veins using radiofrequency current with a circular mapping catheter as a guide. Trivial conduction at the corina of the left superior pulmonary vein was also ablated.  Following ablation, adenosine was infused and did not reveal any further conduction within the pulmonary veins.   With catheter manipulation, the patient developed an initial atypical atrial flutter.  The coronary sinus activation was slightly proximal to distal and the CL was 260 msec.  Entrainment mapping from the CTI revealed a long PPI when compared to the TCL.  Entrainment mapping was attempted from the LA however the tachycardia terminated and could not be reinduced. Dobutamine was administred at 10 mcg/kg/min.  With atrial pacing, a second atrial flutter with a CL of 360 msec was induced.  This had clear distal  to proximal activation though there was variability of the TCL and activation sequence at times.  Entrainment from the mitral valve annulus revealed a PPI=TCL.  I therefore elected to perform mitral isthmus line ablation.  A series of radiofrequency ablation points were delivered between the LIPV and the MVA.  The tachycardia slowed but did not fully terminate.  I therefore intended to perform additional mapping using the Lasso catheter.  Unfortunately, with sheath manipulation, the tachycardia degenerated into a 3rd left atrial flutter.  This atrial flutter cycle length was 220 msec with slightly proximal to distal activation.  Given multiple atypical atrial flutter circuits which were unstable for mapping, no further mapping was performed.  I therefore elected to cardiovert the patient from this 3rd left atrial flutter.  Cardioversion: The patient was then cardioverted to sinus rhythm with a single synchronized 200-J biphasic shock with cardioversion electrodes in the anterior-posterior thoracic configuration. She remained in sinus rhythm thereafter.  Measurements Following Ablation: In sinus rhythm with RR interval was 1037 msec.  Ventricular pacing was performed, which revealed midline decremental VA conduction with a VA Wenckebach cycle length of 580 msec.  Rapid atrial pacing was performed, which revealed an AV Wenckebach cycle length of 360 msec.  Electroisolation was then again confirmed in all four pulmonary veins.  Pacing was performed along the ablation line which confirmed entrance and exit block. Differential atrial pacing from the low lateral right atrium was performed which confirmed persistent complete CTI block from a prior ablation procedure.  The stimulus to earliest activation measured 149msec bidirectionally across the CTI.  The procedure was therefore considered completed.  All catheters were removed, and the sheaths were aspirated and flushed.  The patient was transferred to the  recovery  area for sheath removal per protocol.  A limited bedside transthoracic echocardiogram revealed no pericardial effusion.  There were no early apparent complications.  CONCLUSIONS: 1. Sinus rhythm upon presentation.   2. Return of electrical activity within the right inferior and right superior pulmonary veins at baseline.  There was trivial conduction within the left superior pulmonary vein.  The left inferior pulmonary vein was quiescent from the prior procedure. 3. Successful electrical reisolation of the right superior and inferior pulmonary veins using radiofrequency current. Trivial conduction at the corina of the left superior pulmonary vein was also ablated.  4. 3 separate left atrial flutters were induced.  These were too unstable for adequate mapping.  I did perform additional ablation of the second atrial flutter along the mitral isthmus.   5. Complete bidirectional CTI block was confirmed from a prior ablation procedure 6. No early apparent complications.   Angela Rinks Luka Stohr,MD 12:35 PM 11/13/2013

## 2013-11-13 NOTE — Discharge Summary (Signed)
ELECTROPHYSIOLOGY PROCEDURE DISCHARGE SUMMARY    Patient ID: Angela Cummings,  MRN: [id number], DOB/AGE: April 03, 1956 58 y.o.  Admit date: 11/13/2013 Discharge date: 11/14/2013  Primary Care Physician: Kennon Portela, MD Primary Cardiologist: Einar Gip Electrophysiologist: Thompson Grayer, MD  Primary Discharge Diagnosis:  Paroxysmal atrial fibrillation and atrial flutter status post ablation this admission  Secondary Discharge Diagnosis:  1.  Bradycardia 2.  Obesity 3.  Sleep apnea 4.  Asthma 5.  Depression/anxiety  Procedures This Admission:  1.  Electrophysiology study and radiofrequency catheter ablation on 11-13-2013 by Dr Thompson Grayer.  This study demonstrated sinus rhythm upon presentation; return of electrical activity within the right inferior and right superior pulmonary veins at baseline. There was trivial conduction within the left superior pulmonary vein. The left inferior pulmonary vein was quiescent from the prior procedure; successful electrical reisolation of the right superior and inferior pulmonary veins using radiofrequency current. Trivial conduction at the corina of the left superior pulmonary vein was also ablated; 3 separate left atrial flutters were induced. These were too unstable for adequate mapping. I did perform additional ablation of the second atrial flutter along the mitral isthmus; complete bidirectional CTI block was confirmed from a prior ablation procedure.  There were no early apparent complications.   Brief HPI: Angela Cummings is a 58 y.o. female with a history of paroxysmal atrial fibrillation and atrial flutter.  She underwent previous PVI and CTI in 2014 and initially did well but developed recurrent atrial fibrillation.  Risks, benefits, and alternatives to catheter ablation of atrial fibrillation were reviewed with the patient who wished to proceed.  The patient underwent TEE prior to the procedure which demonstrated normal LV function and no LAA  thrombus.    Hospital Course:  The patient was admitted and underwent EPS/RFCA of atrial fibrillation with details as outlined above.  They were monitored on telemetry overnight which demonstrated sinus rhythm with PAC's.  Groin was without complication on the day of discharge.  The patient was examined by Dr [name] and considered to be stable for discharge.  Wound care and restrictions were reviewed with the patient.  The patient will be seen back by Dr [name] in 12 weeks for post ablation follow up.   Discharge Vitals: Blood pressure 124/66, pulse 71, temperature 98.3 F (36.8 C), temperature source Oral, resp. rate 19, height 5\' 1"  (1.549 m), weight 238 lb 12.1 oz (108.3 kg), SpO2 98.00%.  Physical Exam: Filed Vitals:   11/13/13 2330 11/14/13 0000 11/14/13 0300 11/14/13 0754  BP:   137/85 124/66  Pulse: 59  65 71  Temp:  98.7 F (37.1 C) 98.4 F (36.9 C) 98.3 F (36.8 C)  TempSrc:  Oral Oral Oral  Resp: 13  16 19   Height:      Weight:      SpO2: 95%  99% 98%    GEN- The patient is well appearing, alert and oriented x 3 today.   Head- normocephalic, atraumatic Eyes-  Sclera clear, conjunctiva pink Ears- hearing intact Oropharynx- clear Neck- supple,  Lungs- Clear to ausculation bilaterally, normal work of breathing Heart- Regular rate and rhythm, no murmurs, rubs or gallops, PMI not laterally displaced GI- soft, NT, ND, + BS Extremities- no clubbing, cyanosis, or edema, no hematoma/ bruits Neuro- strength and sensation are intact  Labs:   Lab Results  Component Value Date   WBC 7.4 11/04/2013   HGB 11.0* 11/04/2013   HCT 33.3* 11/04/2013   MCV 87.3 11/04/2013   PLT  262.0 11/04/2013     Recent Labs Lab 11/14/13 0456  NA 138  K 3.8  CL 104  CO2 26  BUN 8  CREATININE 0.65  CALCIUM 8.6  GLUCOSE 98      Discharge Medications:    Medication List         albuterol 108 (90 BASE) MCG/ACT inhaler  Commonly known as:  PROVENTIL HFA;VENTOLIN HFA  Inhale 1 puff  into the lungs every 6 (six) hours as needed for wheezing.     celecoxib 200 MG capsule  Commonly known as:  CELEBREX  Take 200 mg by mouth daily.     diltiazem 180 MG 24 hr capsule  Commonly known as:  CARDIZEM CD  Take 180 mg by mouth daily.     pantoprazole 40 MG tablet  Commonly known as:  PROTONIX  Take 1 tablet (40 mg total) by mouth daily.     propafenone 225 MG tablet  Commonly known as:  RYTHMOL  Take 225 mg by mouth 2 (two) times daily.     XARELTO 20 MG Tabs tablet  Generic drug:  rivaroxaban  Take 20 mg by mouth daily after supper.        Disposition:   Follow-up Information   Follow up with Thompson Grayer, MD On 02/18/2014. (10:45)    Specialty:  Cardiology   Contact information:   [place] [id number] [phone]     home  Duration of Discharge Encounter: Greater than 30 minutes including physician time.  Signed,  Thompson Grayer MD

## 2013-11-13 NOTE — Progress Notes (Signed)
Pt continues to c/o back pain from a previous fall about 3 months ago while at home.  States that when she turns from side to side to utilize the bedpan, it causes more intense back pain.  RLE remain warm, pulses palpable and coloring remain WNL.  Placed pt's right leg up on a pillow and gave PRN pain meds to address pt's needs.  No hematoma, bleeding noted at right groin site - gauze remains clean, dry and intact.  Pt states that her leg on the pillow "feels better".

## 2013-11-13 NOTE — Progress Notes (Signed)
  Echocardiogram Echocardiogram Transesophageal has been performed.  Angela Cummings 11/13/2013, 9:05 AM

## 2013-11-13 NOTE — OR Nursing (Signed)
Patient transferred to Cath Lab for her EP study.

## 2013-11-13 NOTE — Op Note (Signed)
    Transesophageal Echocardiogram Note  Angela Cummings [id number] 04/08/1956  Procedure: Transesophageal Echocardiogram Indications: atrial fib  Procedure Details Consent: Obtained Time Out: Verified patient identification, verified procedure, site/side was marked, verified correct patient position, special equipment/implants available, Radiology Safety Procedures followed,  medications/allergies/relevent history reviewed, required imaging and test results available.  Performed  Medications: Fentanyl: 75 mcg iv Versed: 7 mg iv  Left Ventrical:  Normal LV functioin  Mitral Valve: mild MR  Aortic Valve: normal  Tricuspid Valve: trace TR  Pulmonic Valve: normal  Left Atrium/ Left atrial appendage:  No evidence of thrombus There is significant pectinate muscle visible in the tip of the LAA.  We have Definity contrast which helped define the LAA slightly better than without.  I reviewed the images with Dr. Meda Coffee who also feels that there is no thrombus in the tip of the LAA but only pectinate muscle.  Atrial septum: intact  Aorta: normal   Complications: No apparent complications Patient did tolerate procedure well.   [name], Brooke Bonito., MD, [facility] 11/13/2013, 8:55 AM

## 2013-11-13 NOTE — Interval H&P Note (Signed)
History and Physical Interval Note:  11/13/2013 8:18 AM  Angela Cummings  has presented today for surgery, with the diagnosis of A FIB  The various methods of treatment have been discussed with the patient and family. After consideration of risks, benefits and other options for treatment, the patient has consented to  Procedure(s): TRANSESOPHAGEAL ECHOCARDIOGRAM (TEE) (N/A) as a surgical intervention .  The patient's history has been reviewed, patient examined, no change in status, stable for surgery.  I have reviewed the patient's chart and labs.  Questions were answered to the patient's satisfaction.     Wonda Cheng Nahser

## 2013-11-13 NOTE — Transfer of Care (Signed)
Immediate Anesthesia Transfer of Care Note  Patient: Angela Cummings  Procedure(s) Performed: Procedure(s): ATRIAL FIBRILLATION ABLATION (N/A)  Patient Location: Cath Lab  Anesthesia Type:MAC  Level of Consciousness: awake, alert  and oriented  Airway & Oxygen Therapy: Patient Spontanous Breathing  Post-op Assessment: Report given to PACU RN, Post -op Vital signs reviewed and stable and Patient moving all extremities  Post vital signs: Reviewed and stable  Complications: No apparent anesthesia complications

## 2013-11-14 ENCOUNTER — Encounter (HOSPITAL_COMMUNITY): Payer: Self-pay | Admitting: Cardiovascular Disease

## 2013-11-14 DIAGNOSIS — I4891 Unspecified atrial fibrillation: Secondary | ICD-10-CM

## 2013-11-14 LAB — BASIC METABOLIC PANEL
BUN: 8 mg/dL (ref 6–23)
CALCIUM: 8.6 mg/dL (ref 8.4–10.5)
CO2: 26 mEq/L (ref 19–32)
Chloride: 104 mEq/L (ref 96–112)
Creatinine, Ser: 0.65 mg/dL (ref 0.50–1.10)
GFR calc non Af Amer: >90 mL/min (ref >90)
Glucose, Bld: 98 mg/dL (ref 70–99)
Potassium: 3.8 mEq/L (ref 3.7–5.3)
Sodium: 138 mEq/L (ref 137–147)

## 2013-11-14 LAB — GLUCOSE, CAPILLARY: GLUCOSE-CAPILLARY: 96 mg/dL (ref 70–99)

## 2013-11-14 MED ORDER — PANTOPRAZOLE SODIUM 40 MG PO TBEC: 40.0000 mg | DELAYED_RELEASE_TABLET | Freq: Every day | ORAL | Status: DC

## 2013-11-14 MED ORDER — HYDROCORTISONE 1 % EX CREA
TOPICAL_CREAM | Freq: Four times a day (QID) | CUTANEOUS | Status: DC | PRN
  Administered 2013-11-14: 1 via TOPICAL
  Administered 2013-11-14: 10:00:00 via TOPICAL
  Filled 2013-11-14: qty 28

## 2013-11-14 NOTE — Progress Notes (Signed)
Notified Md per pts request for something for her back.  Itching irritated s/p procedure earlier today.  Red area noted mid upper back.   Awaiting new orders.  Will continue to monitor. Angela Cummings

## 2013-11-14 NOTE — Discharge Instructions (Signed)
No driving for 5 days. No lifting over 5 lbs for 1 week. No sexual activity for 1 week. You may return to work in 1 week. Keep procedure site clean & dry. If you notice increased pain, swelling, bleeding or pus, call/return!  You may shower, but no soaking baths/hot tubs/pools for 1 week.  ° ° °

## 2013-11-14 NOTE — Progress Notes (Signed)
Education and discharge instructions given to pt and husband. Verbalized understanding. Pt escorted to car no distress noted.

## 2013-11-15 ENCOUNTER — Encounter (HOSPITAL_COMMUNITY): Payer: Self-pay | Admitting: *Deleted

## 2014-02-01 ENCOUNTER — Other Ambulatory Visit: Payer: Self-pay | Admitting: Internal Medicine

## 2014-02-18 ENCOUNTER — Encounter: Payer: Self-pay | Admitting: *Deleted

## 2014-02-18 ENCOUNTER — Encounter: Payer: Self-pay | Admitting: Internal Medicine

## 2014-02-18 ENCOUNTER — Ambulatory Visit (INDEPENDENT_AMBULATORY_CARE_PROVIDER_SITE_OTHER): Payer: 59 | Admitting: Internal Medicine

## 2014-02-18 VITALS — BP 118/72 | HR 141 | Ht 61.5 in | Wt 223.2 lb

## 2014-02-18 DIAGNOSIS — F32A Depression, unspecified: Secondary | ICD-10-CM | POA: Insufficient documentation

## 2014-02-18 DIAGNOSIS — F329 Major depressive disorder, single episode, unspecified: Secondary | ICD-10-CM | POA: Insufficient documentation

## 2014-02-18 DIAGNOSIS — F4321 Adjustment disorder with depressed mood: Secondary | ICD-10-CM

## 2014-02-18 DIAGNOSIS — E669 Obesity, unspecified: Secondary | ICD-10-CM

## 2014-02-18 DIAGNOSIS — I4891 Unspecified atrial fibrillation: Secondary | ICD-10-CM

## 2014-02-18 LAB — BASIC METABOLIC PANEL
BUN: 8 mg/dL (ref 6–23)
CALCIUM: 9 mg/dL (ref 8.4–10.5)
CO2: 26 mEq/L (ref 19–32)
CREATININE: 0.9 mg/dL (ref 0.4–1.2)
Chloride: 103 mEq/L (ref 96–112)
GFR: 71.4 mL/min (ref >60.00)
Glucose, Bld: 93 mg/dL (ref 70–99)
Potassium: 4.3 mEq/L (ref 3.5–5.1)
Sodium: 138 mEq/L (ref 135–145)

## 2014-02-18 LAB — CBC WITH DIFFERENTIAL/PLATELET
Basophils Absolute: 0 10*3/uL (ref 0.0–0.1)
Basophils Relative: 0.6 % (ref 0.0–3.0)
EOS ABS: 0.1 10*3/uL (ref 0.0–0.7)
EOS PCT: 1.6 % (ref 0.0–5.0)
HCT: 37 % (ref 36.0–46.0)
Hemoglobin: 12.1 g/dL (ref 12.0–15.0)
Lymphocytes Relative: 32.6 % (ref 12.0–46.0)
Lymphs Abs: 2.1 10*3/uL (ref 0.7–4.0)
MCHC: 32.8 g/dL (ref 30.0–36.0)
MCV: 83.6 fl (ref 78.0–100.0)
MONO ABS: 0.7 10*3/uL (ref 0.1–1.0)
Monocytes Relative: 11.3 % (ref 3.0–12.0)
NEUTROS PCT: 53.9 % (ref 43.0–77.0)
Neutro Abs: 3.5 10*3/uL (ref 1.4–7.7)
Platelets: 278 10*3/uL (ref 150.0–400.0)
RBC: 4.43 Mil/uL (ref 3.87–5.11)
RDW: 15.4 % (ref 11.5–15.5)
WBC: 6.5 10*3/uL (ref 4.0–10.5)

## 2014-02-18 MED ORDER — SODIUM CHLORIDE 0.9 % IJ SOLN: 3.0000 mL | Freq: Two times a day (BID) | INTRAMUSCULAR | Status: DC

## 2014-02-18 MED ORDER — SODIUM CHLORIDE 0.9 % IV SOLN: 250.0000 mL | INTRAVENOUS | Status: DC

## 2014-02-18 MED ORDER — SODIUM CHLORIDE 0.9 % IJ SOLN: 3.0000 mL | INTRAMUSCULAR | Status: DC | PRN

## 2014-02-18 NOTE — Addendum Note (Signed)
Addended by: Janan Halter F on: 02/18/2014 11:44 AM   Modules accepted: Orders

## 2014-02-18 NOTE — Patient Instructions (Signed)
Your physician has recommended that you have a Cardioversion (DCCV). Electrical Cardioversion uses a jolt of electricity to your heart either through paddles or wired patches attached to your chest. This is a controlled, usually prescheduled, procedure. Defibrillation is done under light anesthesia in the hospital, and you usually go home the day of the procedure. This is done to get your heart back into a normal rhythm. You are not awake for the procedure. Please see the instruction sheet given to you today.   Your physician recommends that you schedule a follow-up appointment in: 6 weeks with Ceasar Lund

## 2014-02-18 NOTE — Progress Notes (Signed)
PCP:  Kennon Portela, MD Primary Cardiologist: Dr Einar Gip  The patient presents today for routine electrophysiology followup.  She continues to have ERAF post ablation.   She denies procedure related complications.   Today, she denies symptoms of orthopnea, PND, lower extremity edema, dizziness, presyncope, syncope, or neurologic sequela.  The patient feels that she is tolerating medications without difficulties and is otherwise without complaint today.  Her primary concern today is with the recent rapid decline and death of her spouse due to cancer.  She is tearful today.  She reports difficulty sleeping.  She requests medicine to help her cope.  Past Medical History  Diagnosis Date  . Atrial fibrillation 11/2010    paroxysmal  . Bradycardia   . DJD (degenerative joint disease)   . Obesity   . Mild sleep apnea   . Asthma   . Anxiety   . Depression    Past Surgical History  Procedure Laterality Date  . Cholecystectomy    . Cesarean section    . Knee surgery    . Neck tumor resection      benign  . Hernia repair    . Tee without cardioversion  03/13/2012    Procedure: TRANSESOPHAGEAL ECHOCARDIOGRAM (TEE);  Surgeon: [name], MD;  Location: [place];  Service: Cardiovascular;  Laterality: N/A;  Rm 2029  . Cardioversion  03/13/2012    Procedure: CARDIOVERSION;  Surgeon: [name], MD;  Location: [place];  Service: Cardiovascular;  Laterality: N/A;  . Cardioversion N/A 07/26/2012    Procedure: CARDIOVERSION;  Surgeon: Thompson Grayer, MD;  Location: [place];  Service: Cardiovascular;  Laterality: N/A;  . Tee without cardioversion N/A 09/02/2012    Procedure: TRANSESOPHAGEAL ECHOCARDIOGRAM (TEE);  Surgeon: Josue Hector, MD;  Location: [facility];  Service: Cardiovascular;  Laterality: N/A;  . Ablation  08/2012    PVI by Dr [name]  . Tee without cardioversion N/A 11/13/2013    Procedure: TRANSESOPHAGEAL ECHOCARDIOGRAM (TEE);  Surgeon: [name], MD;  Location: [place];  Service: Cardiovascular;  Laterality: N/A;  . Ablation  11/13/2013    PVI by Dr [name]    Current Outpatient Prescriptions  Medication Sig Dispense Refill  . albuterol (PROVENTIL HFA;VENTOLIN HFA) 108 (90 BASE) MCG/ACT inhaler Inhale 1 puff into the lungs every 6 (six) hours as needed for wheezing.       . celecoxib (CELEBREX) 200 MG capsule Take 200 mg by mouth daily.      Marland Kitchen diltiazem (CARDIZEM CD) 180 MG 24 hr capsule Take 180 mg by mouth daily.      . pantoprazole (PROTONIX) 40 MG tablet Take 1 tablet (40 mg total) by mouth daily.  60 tablet  0  . propafenone (RYTHMOL) 225 MG tablet Take 225 mg by mouth 2 (two) times daily.      . Rivaroxaban (XARELTO) 20 MG TABS tablet Take 20 mg by mouth daily after supper.        No current facility-administered medications for this visit.    Allergies  Allergen Reactions  . Adhesive [Tape] Itching and Rash    History   Social History  . Marital Status: Married    Spouse Name: N/A    Number of Children: 3  . Years of Education: N/A   Occupational History  . Not on file.   Social History Main Topics  . Smoking status: Never Smoker   . Smokeless tobacco: Never Used  . Alcohol Use: No     Comment:  Rarely  . Drug Use: No  . Sexual Activity: Yes   Other Topics Concern  . Not on file   Social History Narrative   Married with 3 children (17,30, 33) and granddaughter (78).   Lives in [place]. Unemployed.    Family History  Problem Relation Age of Onset  . Diabetes Other   . Thyroid disease Other   . Emphysema Mother   . Cancer Father   . Cancer Sister   . Emphysema Brother   . Cancer Daughter     ROS-  All systems are reviewed and are negative except as outlined in the HPI above  Physical Exam: Filed Vitals:   02/18/14 1106  BP: 118/72  Pulse: 141  Height: 5' 1.5" (1.562 m)  Weight: 223 lb 3.2 oz (101.243 kg)    GEN- The patient is well appearing, alert and oriented x 3 today.   Head- normocephalic,  atraumatic Eyes-  Sclera clear, conjunctiva pink Ears- hearing intact Oropharynx- clear Neck- supple, no JVP Lymph- no cervical lymphadenopathy Lungs- Clear to ausculation bilaterally, normal work of breathing Heart- irregular rate and rhythm, no murmurs, rubs or gallops, PMI not laterally displaced GI- soft, NT, ND, + BS Extremities- no clubbing, cyanosis, or edema MS- no significant deformity or atrophy Skin- no rash or lesion Psych- euthymic mood, full affect Neuro- strength and sensation are intact  ekg today reveals afib with RVR, RBBB  Assessment and Plan:  1. afib ERAF post ablation Reports compliance with xarelto Return later this week for an ekg.  If she remains in afib then proceed with cardioversion.  Risks of cardioversion were discussed with the patient today who is willing to proceed. If she fails to maintain sinus then I would consider amiodarone in the future.  She had multiple atypical flutter circuits which could not be ablated.  2. Situational depression related to her husbands death More than 20 minutes were spent today in counseling regarding her husbands recent death.  She has situational depression.  I will start zoloft 25mg  daily x 7 days then increase to 50mg  daily.  Follow-up with primary care within 1 week is instructed for further management  3. Obesity Weight loss is necessary long term Body mass index is 41.5 kg/(m^2).  Return to see Roderic Palau in the afib clinic in 6 weeks

## 2014-02-19 ENCOUNTER — Encounter (HOSPITAL_COMMUNITY): Payer: Self-pay

## 2014-02-20 ENCOUNTER — Encounter (HOSPITAL_COMMUNITY): Payer: Self-pay | Admitting: *Deleted

## 2014-02-20 ENCOUNTER — Encounter (HOSPITAL_COMMUNITY): Payer: 59 | Admitting: Anesthesiology

## 2014-02-20 ENCOUNTER — Ambulatory Visit (HOSPITAL_COMMUNITY): Payer: 59 | Admitting: Anesthesiology

## 2014-02-20 ENCOUNTER — Ambulatory Visit (HOSPITAL_COMMUNITY)
Admission: RE | Admit: 2014-02-20 | Discharge: 2014-02-20 | Disposition: A | Discharge status: Home / Self Care | Payer: 59 | Source: Ambulatory Visit | Attending: Cardiology | Admitting: Cardiology

## 2014-02-20 ENCOUNTER — Encounter (HOSPITAL_COMMUNITY)
Admission: RE | Disposition: A | Discharge status: Home / Self Care | Payer: Self-pay | Source: Ambulatory Visit | Attending: Cardiology

## 2014-02-20 DIAGNOSIS — F4321 Adjustment disorder with depressed mood: Secondary | ICD-10-CM | POA: Insufficient documentation

## 2014-02-20 DIAGNOSIS — I4891 Unspecified atrial fibrillation: Secondary | ICD-10-CM | POA: Diagnosis present

## 2014-02-20 DIAGNOSIS — G4730 Sleep apnea, unspecified: Secondary | ICD-10-CM | POA: Insufficient documentation

## 2014-02-20 DIAGNOSIS — Z6841 Body Mass Index (BMI) 40.0 and over, adult: Secondary | ICD-10-CM | POA: Diagnosis not present

## 2014-02-20 DIAGNOSIS — K219 Gastro-esophageal reflux disease without esophagitis: Secondary | ICD-10-CM | POA: Insufficient documentation

## 2014-02-20 DIAGNOSIS — J45909 Unspecified asthma, uncomplicated: Secondary | ICD-10-CM | POA: Diagnosis not present

## 2014-02-20 DIAGNOSIS — M1990 Unspecified osteoarthritis, unspecified site: Secondary | ICD-10-CM | POA: Insufficient documentation

## 2014-02-20 HISTORY — PX: CARDIOVERSION: SHX1299

## 2014-02-20 SURGERY — CARDIOVERSION
Anesthesia: Monitor Anesthesia Care

## 2014-02-20 MED ORDER — SODIUM CHLORIDE 0.9 % IV SOLN
INTRAVENOUS | Status: DC
  Administered 2014-02-20: 500 mL via INTRAVENOUS

## 2014-02-20 MED ORDER — SODIUM CHLORIDE 0.9 % IV SOLN
INTRAVENOUS | Status: DC | PRN
  Administered 2014-02-20: 12:00:00 via INTRAVENOUS

## 2014-02-20 MED ORDER — LIDOCAINE HCL (PF) 2 % IJ SOLN
INTRAMUSCULAR | Status: DC | PRN
  Administered 2014-02-20: 40 mL via INTRADERMAL

## 2014-02-20 MED ORDER — PROPOFOL 10 MG/ML IV BOLUS
INTRAVENOUS | Status: DC | PRN
  Administered 2014-02-20: 50 mg via INTRAVENOUS

## 2014-02-20 NOTE — Interval H&P Note (Signed)
History and Physical Interval Note:  02/20/2014 12:18 PM  Angela Cummings  has presented today for surgery, with the diagnosis of AFIB  The various methods of treatment have been discussed with the patient and family. After consideration of risks, benefits and other options for treatment, the patient has consented to  Procedure(s): CARDIOVERSION (N/A) as a surgical intervention .  The patient's history has been reviewed, patient examined, no change in status, stable for surgery.  I have reviewed the patient's chart and labs.  Questions were answered to the patient's satisfaction.     Ramell Wacha [employer]

## 2014-02-20 NOTE — Transfer of Care (Signed)
Immediate Anesthesia Transfer of Care Note  Patient: Angela Cummings  Procedure(s) Performed: Procedure(s): CARDIOVERSION (N/A)  Patient Location: Endoscopy Unit  Anesthesia Type:MAC  Level of Consciousness: awake and alert   Airway & Oxygen Therapy: Patient Spontanous Breathing and Patient connected to nasal cannula oxygen  Post-op Assessment: Report given to PACU RN and Post -op Vital signs reviewed and stable  Post vital signs: Reviewed and stable  Complications: No apparent anesthesia complications

## 2014-02-20 NOTE — Discharge Instructions (Signed)

## 2014-02-20 NOTE — H&P (View-Only) (Signed)
PCP:  Kennon Portela, MD Primary Cardiologist: Dr Einar Gip  The patient presents today for routine electrophysiology followup.  She continues to have ERAF post ablation.   She denies procedure related complications.   Today, she denies symptoms of orthopnea, PND, lower extremity edema, dizziness, presyncope, syncope, or neurologic sequela.  The patient feels that she is tolerating medications without difficulties and is otherwise without complaint today.  Her primary concern today is with the recent rapid decline and death of her spouse due to cancer.  She is tearful today.  She reports difficulty sleeping.  She requests medicine to help her cope.  Past Medical History  Diagnosis Date  . Atrial fibrillation 11/2010    paroxysmal  . Bradycardia   . DJD (degenerative joint disease)   . Obesity   . Mild sleep apnea   . Asthma   . Anxiety   . Depression    Past Surgical History  Procedure Laterality Date  . Cholecystectomy    . Cesarean section    . Knee surgery    . Neck tumor resection      benign  . Hernia repair    . Tee without cardioversion  03/13/2012    Procedure: TRANSESOPHAGEAL ECHOCARDIOGRAM (TEE);  Surgeon: [name], MD;  Location: [place];  Service: Cardiovascular;  Laterality: N/A;  Rm 2029  . Cardioversion  03/13/2012    Procedure: CARDIOVERSION;  Surgeon: [name], MD;  Location: [place];  Service: Cardiovascular;  Laterality: N/A;  . Cardioversion N/A 07/26/2012    Procedure: CARDIOVERSION;  Surgeon: Thompson Grayer, MD;  Location: [place];  Service: Cardiovascular;  Laterality: N/A;  . Tee without cardioversion N/A 09/02/2012    Procedure: TRANSESOPHAGEAL ECHOCARDIOGRAM (TEE);  Surgeon: Josue Hector, MD;  Location: [facility] ENDOSCOPY;  Service: Cardiovascular;  Laterality: N/A;  . Ablation  08/2012    PVI by Dr [name]  . Tee without cardioversion N/A 11/13/2013    Procedure: TRANSESOPHAGEAL ECHOCARDIOGRAM (TEE);  Surgeon: [name], MD;  Location: [place];  Service: Cardiovascular;  Laterality: N/A;  . Ablation  11/13/2013    PVI by Dr [name]    Current Outpatient Prescriptions  Medication Sig Dispense Refill  . albuterol (PROVENTIL HFA;VENTOLIN HFA) 108 (90 BASE) MCG/ACT inhaler Inhale 1 puff into the lungs every 6 (six) hours as needed for wheezing.       . celecoxib (CELEBREX) 200 MG capsule Take 200 mg by mouth daily.      Marland Kitchen diltiazem (CARDIZEM CD) 180 MG 24 hr capsule Take 180 mg by mouth daily.      . pantoprazole (PROTONIX) 40 MG tablet Take 1 tablet (40 mg total) by mouth daily.  60 tablet  0  . propafenone (RYTHMOL) 225 MG tablet Take 225 mg by mouth 2 (two) times daily.      . Rivaroxaban (XARELTO) 20 MG TABS tablet Take 20 mg by mouth daily after supper.        No current facility-administered medications for this visit.    Allergies  Allergen Reactions  . Adhesive [Tape] Itching and Rash    History   Social History  . Marital Status: Married    Spouse Name: N/A    Number of Children: 3  . Years of Education: N/A   Occupational History  . Not on file.   Social History Main Topics  . Smoking status: Never Smoker   . Smokeless tobacco: Never Used  . Alcohol Use: No     Comment:  Rarely  . Drug Use: No  . Sexual Activity: Yes   Other Topics Concern  . Not on file   Social History Narrative   Married with 3 children (17,30, 62) and granddaughter (41).   Lives in [place]. Unemployed.    Family History  Problem Relation Age of Onset  . Diabetes Other   . Thyroid disease Other   . Emphysema Mother   . Cancer Father   . Cancer Sister   . Emphysema Brother   . Cancer Daughter     ROS-  All systems are reviewed and are negative except as outlined in the HPI above  Physical Exam: Filed Vitals:   02/18/14 1106  BP: 118/72  Pulse: 141  Height: 5' 1.5" (1.562 m)  Weight: 223 lb 3.2 oz (101.243 kg)    GEN- The patient is well appearing, alert and oriented x 3 today.   Head- normocephalic,  atraumatic Eyes-  Sclera clear, conjunctiva pink Ears- hearing intact Oropharynx- clear Neck- supple, no JVP Lymph- no cervical lymphadenopathy Lungs- Clear to ausculation bilaterally, normal work of breathing Heart- irregular rate and rhythm, no murmurs, rubs or gallops, PMI not laterally displaced GI- soft, NT, ND, + BS Extremities- no clubbing, cyanosis, or edema MS- no significant deformity or atrophy Skin- no rash or lesion Psych- euthymic mood, full affect Neuro- strength and sensation are intact  ekg today reveals afib with RVR, RBBB  Assessment and Plan:  1. afib ERAF post ablation Reports compliance with xarelto Return later this week for an ekg.  If she remains in afib then proceed with cardioversion.  Risks of cardioversion were discussed with the patient today who is willing to proceed. If she fails to maintain sinus then I would consider amiodarone in the future.  She had multiple atypical flutter circuits which could not be ablated.  2. Situational depression related to her husbands death More than 20 minutes were spent today in counseling regarding her husbands recent death.  She has situational depression.  I will start zoloft 25mg  daily x 7 days then increase to 50mg  daily.  Follow-up with primary care within 1 week is instructed for further management  3. Obesity Weight loss is necessary long term Body mass index is 41.5 kg/(m^2).  Return to see Roderic Palau in the afib clinic in 6 weeks

## 2014-02-20 NOTE — Procedures (Signed)
Electrical Cardioversion Procedure Note LAKETHIA COPPESS [id number] 1955-10-04  Procedure: Electrical Cardioversion Indications:  Atrial Fibrillation.  Patient has been on Xarelto without missing doses.   Procedure Details Consent: Risks of procedure as well as the alternatives and risks of each were explained to the (patient/caregiver).  Consent for procedure obtained. Time Out: Verified patient identification, verified procedure, site/side was marked, verified correct patient position, special equipment/implants available, medications/allergies/relevent history reviewed, required imaging and test results available.  Performed  Patient placed on cardiac monitor, pulse oximetry, supplemental oxygen as necessary.  Sedation given: Propofol Pacer pads placed anterior and posterior chest.  Cardioverted 1 time(s).  Cardioverted at [place].  Evaluation Findings: Post procedure EKG shows: NSR with PACs.  Complications: None Patient did tolerate procedure well.   Loralie Champagne 02/20/2014, 12:20 PM

## 2014-02-20 NOTE — Anesthesia Postprocedure Evaluation (Signed)
  Anesthesia Post-op Note  Patient: Angela Cummings  Procedure(s) Performed: Procedure(s): CARDIOVERSION (N/A)  Patient Location: Endoscopy Unit  Anesthesia Type:MAC  Level of Consciousness: awake  Airway and Oxygen Therapy: Patient Spontanous Breathing  Post-op Pain: none  Post-op Assessment: Post-op Vital signs reviewed, Patient's Cardiovascular Status Stable, Respiratory Function Stable, Patent Airway, No signs of Nausea or vomiting and Pain level controlled  Post-op Vital Signs: Reviewed and stable  Last Vitals:  Filed Vitals:   02/20/14 1300  BP: 127/72  Pulse: 71  Temp:   Resp: 15    Complications: No apparent anesthesia complications

## 2014-02-20 NOTE — Anesthesia Preprocedure Evaluation (Signed)
Anesthesia Evaluation  Patient identified by MRN, date of birth, ID band Patient awake    Reviewed: Allergy & Precautions, H&P , NPO status , Patient's Chart, lab work & pertinent test results  History of Anesthesia Complications Negative for: history of anesthetic complications  Airway Mallampati: II TM Distance: >3 FB Neck ROM: Full    Dental  (+) Dental Advisory Given, Edentulous Upper   Pulmonary asthma ,  breath sounds clear to auscultation        Cardiovascular - angina- Past MI and - CHF + dysrhythmias Atrial Fibrillation Rhythm:Irregular Rate:Normal     Neuro/Psych PSYCHIATRIC DISORDERS negative neurological ROS     GI/Hepatic Neg liver ROS, GERD-  Medicated and Controlled,  Endo/Other  Morbid obesity  Renal/GU      Musculoskeletal  (+) Arthritis -, Osteoarthritis,    Abdominal (+) + obese,   Peds  Hematology negative hematology ROS (+)   Anesthesia Other Findings   Reproductive/Obstetrics negative OB ROS                           Anesthesia Physical Anesthesia Plan  ASA: II  Anesthesia Plan: MAC   Post-op Pain Management:    Induction: Intravenous  Airway Management Planned: Natural Airway  Additional Equipment: None  Intra-op Plan:   Post-operative Plan:   Informed Consent: I have reviewed the patients History and Physical, chart, labs and discussed the procedure including the risks, benefits and alternatives for the proposed anesthesia with the patient or authorized representative who has indicated his/her understanding and acceptance.   Dental advisory given  Plan Discussed with: CRNA and Surgeon  Anesthesia Plan Comments:         Anesthesia Quick Evaluation

## 2014-02-23 ENCOUNTER — Encounter (HOSPITAL_COMMUNITY): Payer: Self-pay | Admitting: Cardiology

## 2014-03-02 ENCOUNTER — Other Ambulatory Visit: Payer: Self-pay | Admitting: Internal Medicine

## 2014-03-31 ENCOUNTER — Encounter (HOSPITAL_COMMUNITY): Payer: Self-pay | Admitting: Nurse Practitioner

## 2014-03-31 ENCOUNTER — Ambulatory Visit (HOSPITAL_COMMUNITY)
Admission: RE | Admit: 2014-03-31 | Discharge: 2014-03-31 | Disposition: A | Discharge status: Home / Self Care | Payer: 59 | Source: Ambulatory Visit | Attending: Nurse Practitioner | Admitting: Nurse Practitioner

## 2014-03-31 VITALS — BP 132/78 | HR 58 | Wt 223.1 lb

## 2014-03-31 DIAGNOSIS — E669 Obesity, unspecified: Secondary | ICD-10-CM | POA: Insufficient documentation

## 2014-03-31 DIAGNOSIS — I4891 Unspecified atrial fibrillation: Secondary | ICD-10-CM | POA: Diagnosis present

## 2014-03-31 DIAGNOSIS — Z6841 Body Mass Index (BMI) 40.0 and over, adult: Secondary | ICD-10-CM | POA: Diagnosis not present

## 2014-03-31 DIAGNOSIS — Z7901 Long term (current) use of anticoagulants: Secondary | ICD-10-CM | POA: Insufficient documentation

## 2014-03-31 DIAGNOSIS — F4321 Adjustment disorder with depressed mood: Secondary | ICD-10-CM | POA: Insufficient documentation

## 2014-03-31 NOTE — Patient Instructions (Signed)
Your physician recommends that you schedule a follow-up appointment in: 4 months with Dr Allred  

## 2014-03-31 NOTE — Progress Notes (Signed)
PCP:  Kennon Portela, MD Primary Cardiologist: Dr Einar Gip  The patient presents today for routine electrophysiology followup.she had successful DCCV 2 days past last visit.  She  reports only one episode of afib since last visit which lasted an hour. Overall feels much better than prior to ablation 6/15. She is still dealing with the death of her husband this past summer but appears to be in a better frame of mind since last visit.    Today, she denies symptoms of orthopnea, PND, lower extremity edema, dizziness, presyncope, syncope, or neurologic sequela.  The patient feels that she is tolerating medications without difficulties and is otherwise without complaint today.    Past Medical History  Diagnosis Date  . Atrial fibrillation 11/2010    paroxysmal  . Bradycardia   . DJD (degenerative joint disease)   . Obesity   . Mild sleep apnea   . Asthma   . Anxiety   . Depression    Past Surgical History  Procedure Laterality Date  . Cholecystectomy    . Cesarean section    . Knee surgery    . Neck tumor resection      benign  . Hernia repair    . Tee without cardioversion  03/13/2012    Procedure: TRANSESOPHAGEAL ECHOCARDIOGRAM (TEE);  Surgeon: [name], MD;  Location: [place];  Service: Cardiovascular;  Laterality: N/A;  Rm 2029  . Cardioversion  03/13/2012    Procedure: CARDIOVERSION;  Surgeon: [name], MD;  Location: [place];  Service: Cardiovascular;  Laterality: N/A;  . Cardioversion N/A 07/26/2012    Procedure: CARDIOVERSION;  Surgeon: Thompson Grayer, MD;  Location: [place];  Service: Cardiovascular;  Laterality: N/A;  . Tee without cardioversion N/A 09/02/2012    Procedure: TRANSESOPHAGEAL ECHOCARDIOGRAM (TEE);  Surgeon: Josue Hector, MD;  Location: [facility] ENDOSCOPY;  Service: Cardiovascular;  Laterality: N/A;  . Ablation  08/2012    PVI by Dr [name]  . Tee without cardioversion N/A 11/13/2013    Procedure: TRANSESOPHAGEAL ECHOCARDIOGRAM (TEE);  Surgeon: [name], MD;  Location: [place];  Service: Cardiovascular;  Laterality: N/A;  . Ablation  11/13/2013    PVI by Dr [name]  . Cardioversion N/A 02/20/2014    Procedure: CARDIOVERSION;  Surgeon: Larey Dresser, MD;  Location: [place];  Service: Cardiovascular;  Laterality: N/A;    Current Outpatient Prescriptions  Medication Sig Dispense Refill  . albuterol (PROVENTIL HFA;VENTOLIN HFA) 108 (90 BASE) MCG/ACT inhaler Inhale 1 puff into the lungs every 6 (six) hours as needed for wheezing.       . celecoxib (CELEBREX) 200 MG capsule Take 200 mg by mouth daily.      Marland Kitchen diltiazem (CARDIZEM CD) 180 MG 24 hr capsule Take 180 mg by mouth daily.      . pantoprazole (PROTONIX) 40 MG tablet Take 1 tablet (40 mg total) by mouth daily.  60 tablet  0  . propafenone (RYTHMOL) 225 MG tablet TAKE 1 TABLET BY MOUTH TWICE DAILY  60 tablet  0  . sertraline (ZOLOFT) 50 MG tablet Take 25 mg by mouth daily.      Alveda Reasons 20 MG TABS tablet TAKE 1 TABLET BY MOUTH DAILY  30 tablet  0   Current Facility-Administered Medications  Medication Dose Route Frequency Provider Last Rate Last Dose  . 0.9 %  sodium chloride infusion  250 mL Intravenous Continuous Thompson Grayer, MD        Allergies  Allergen Reactions  .  Adhesive [Tape] Itching and Rash    History   Social History  . Marital Status: Widowed    Spouse Name: N/A    Number of Children: 3  . Years of Education: N/A   Occupational History  . Not on file.   Social History Main Topics  . Smoking status: Never Smoker   . Smokeless tobacco: Never Used  . Alcohol Use: No     Comment: Rarely  . Drug Use: No  . Sexual Activity: No   Other Topics Concern  . Not on file   Social History Narrative   Married with 3 children (17,30, 62) and granddaughter (3).   Lives in [place]. Unemployed.    Family History  Problem Relation Age of Onset  . Diabetes Other   . Thyroid disease Other   . Emphysema Mother   . Cancer Father   . Cancer  Sister   . Emphysema Brother   . Cancer Daughter     ROS-  All systems are reviewed and are negative except as outlined in the HPI above  Physical Exam: Filed Vitals:   03/31/14 1347  BP: 132/78  Pulse: 58  Weight: 223 lb 2 oz (101.209 kg)  SpO2: 99%    GEN- The patient is well appearing, alert and oriented x 3 today.   Head- normocephalic, atraumatic Eyes-  Sclera clear, conjunctiva pink Ears- hearing intact Oropharynx- clear Neck- supple, no JVP Lymph- no cervical lymphadenopathy Lungs- Clear to ausculation bilaterally, normal work of breathing Heart- irregular rate and rhythm, no murmurs, rubs or gallops, PMI not laterally displaced GI- soft, NT, ND, + BS Extremities- no clubbing, cyanosis, or edema MS- no significant deformity or atrophy Skin- no rash or lesion Psych- euthymic mood, full affect Neuro- strength and sensation are intact  EKG today reveals SB 57 bpm with PAC's. IRBBB.  Assessment and Plan:  1. afib Reports compliance with xarelto Currently with low afib burden s/p cardioversion. Continue propafenone/diltiazem. Continue xarelto   2. Situational depression related to her husbands death Improved.   3. Obesity Weight loss is necessary long term Body mass index is 42.18 kg/(m^2).  Return to see Dr. [name] in 4 months.

## 2014-04-02 NOTE — Addendum Note (Signed)
Encounter addended by: Asencion Gowda, CCT on: 04/02/2014  9:54 AM<BR>     Documentation filed: Charges VN

## 2014-04-15 ENCOUNTER — Other Ambulatory Visit: Payer: Self-pay | Admitting: Internal Medicine

## 2014-04-15 MED ORDER — RIVAROXABAN 20 MG PO TABS: ORAL_TABLET | ORAL | Status: DC

## 2014-04-15 MED ORDER — PROPAFENONE HCL 225 MG PO TABS: ORAL_TABLET | ORAL | Status: DC

## 2014-04-28 ENCOUNTER — Ambulatory Visit (INDEPENDENT_AMBULATORY_CARE_PROVIDER_SITE_OTHER): Payer: 59 | Admitting: Family Medicine

## 2014-04-28 VITALS — BP 120/82 | HR 60 | Temp 97.8°F | Resp 16 | Ht 61.0 in | Wt 224.4 lb

## 2014-04-28 DIAGNOSIS — M25552 Pain in left hip: Secondary | ICD-10-CM

## 2014-04-28 DIAGNOSIS — M1612 Unilateral primary osteoarthritis, left hip: Secondary | ICD-10-CM

## 2014-04-28 MED ORDER — SERTRALINE HCL 50 MG PO TABS: 25.0000 mg | ORAL_TABLET | Freq: Every day | ORAL | Status: DC

## 2014-04-28 MED ORDER — HYDROCODONE-ACETAMINOPHEN 5-325 MG PO TABS: 1.0000 | ORAL_TABLET | Freq: Four times a day (QID) | ORAL | Status: DC | PRN

## 2014-04-28 MED ORDER — DICLOFENAC-MISOPROSTOL 75-0.2 MG PO TBEC: 1.0000 | DELAYED_RELEASE_TABLET | Freq: Two times a day (BID) | ORAL | Status: DC

## 2014-04-28 NOTE — Patient Instructions (Signed)
Hip Pain Your hip is the joint between your upper legs and your lower pelvis. The bones, cartilage, tendons, and muscles of your hip joint perform a lot of work each day supporting your body weight and allowing you to move around. Hip pain can range from a minor ache to severe pain in one or both of your hips. Pain may be felt on the inside of the hip joint near the groin, or the outside near the buttocks and upper thigh. You may have swelling or stiffness as well.  HOME CARE INSTRUCTIONS   Take medicines only as directed by your health care provider.  Apply ice to the injured area:  Put ice in a plastic bag.  Place a towel between your skin and the bag.  Leave the ice on for 15-20 minutes at a time, 3-4 times a day.  Keep your leg raised (elevated) when possible to lessen swelling.  Avoid activities that cause pain.  Follow specific exercises as directed by your health care provider.  Sleep with a pillow between your legs on your most comfortable side.  Record how often you have hip pain, the location of the pain, and what it feels like. SEEK MEDICAL CARE IF:   You are unable to put weight on your leg.  Your hip is red or swollen or very tender to touch.  Your pain or swelling continues or worsens after 1 week.  You have increasing difficulty walking.  You have a fever. SEEK IMMEDIATE MEDICAL CARE IF:   You have fallen.  You have a sudden increase in pain and swelling in your hip. MAKE SURE YOU:   Understand these instructions.  Will watch your condition.  Will get help right away if you are not doing well or get worse. Document Released: 11/16/2009 Document Revised: 10/13/2013 Document Reviewed: 01/23/2013 [facility] Patient Information 2015 [employer], [place]. This information is not intended to replace advice given to you by your health care provider. Make sure you discuss any questions you have with your health care provider.   Hip Exercises RANGE OF MOTION (ROM)  AND STRETCHING EXERCISES  These exercises may help you when beginning to rehabilitate your injury. Doing them too aggressively can worsen your condition. Complete them slowly and gently. Your symptoms may resolve with or without further involvement from your physician, physical therapist or athletic trainer. While completing these exercises, remember:   Restoring tissue flexibility helps normal motion to return to the joints. This allows healthier, less painful movement and activity.  An effective stretch should be held for at least 30 seconds.  A stretch should never be painful. You should only feel a gentle lengthening or release in the stretched tissue. If these stretches worsen your symptoms even when done gently, consult your physician, physical therapist or athletic trainer. STRETCH - Hamstrings, Supine   Lie on your back. Loop a belt or towel over the ball of your right / left foot.  Straighten your right / left knee and slowly pull on the belt to raise your leg. Do not allow the right / left knee to bend. Keep your opposite leg flat on the floor.  Raise the leg until you feel a gentle stretch behind your right / left knee or thigh. Hold this position for __________ seconds. Repeat __________ times. Complete this stretch __________ times per day.  STRETCH - Hip Rotators   Lie on your back on a firm surface. Grasp your right / left knee with your right / left hand and  your ankle with your opposite hand.  Keeping your hips and shoulders firmly planted, gently pull your right / left knee and rotate your lower leg toward your opposite shoulder until you feel a stretch in your buttocks.  Hold this stretch for __________ seconds. Repeat this stretch __________ times. Complete this stretch __________ times per day. STRETCH - Hamstrings/Adductors, V-Sit   Sit on the floor with your legs extended in a large "V," keeping your knees straight.  With your head and chest upright, bend at your  waist reaching for your right foot to stretch your left adductors.  You should feel a stretch in your left inner thigh. Hold for __________ seconds.  Return to the upright position to relax your leg muscles.  Continuing to keep your chest upright, bend straight forward at your waist to stretch your hamstrings.  You should feel a stretch behind both of your thighs and/or knees. Hold for __________ seconds.  Return to the upright position to relax your leg muscles.  Repeat steps 2 through 4 for opposite leg. Repeat __________ times. Complete this exercise __________ times per day.  STRETCHING - Hip Flexors, Lunge  Half kneel with your right / left knee on the floor and your opposite knee bent and directly over your ankle.  Keep good posture with your head over your shoulders. Tighten your buttocks to point your tailbone downward; this will prevent your back from arching too much.  You should feel a gentle stretch in the front of your thigh and/or hip. If you do not feel any resistance, slightly slide your opposite foot forward and then slowly lunge forward so your knee once again lines up over your ankle. Be sure your tailbone remains pointed downward.  Hold this stretch for __________ seconds. Repeat __________ times. Complete this stretch __________ times per day. STRENGTHENING EXERCISES These exercises may help you when beginning to rehabilitate your injury. They may resolve your symptoms with or without further involvement from your physician, physical therapist or athletic trainer. While completing these exercises, remember:   Muscles can gain both the endurance and the strength needed for everyday activities through controlled exercises.  Complete these exercises as instructed by your physician, physical therapist or athletic trainer. Progress the resistance and repetitions only as guided.  You may experience muscle soreness or fatigue, but the pain or discomfort you are trying to  eliminate should never worsen during these exercises. If this pain does worsen, stop and make certain you are following the directions exactly. If the pain is still present after adjustments, discontinue the exercise until you can discuss the trouble with your clinician. STRENGTH - Hip Extensors, Bridge   Lie on your back on a firm surface. Bend your knees and place your feet flat on the floor.  Tighten your buttocks muscles and lift your bottom off the floor until your trunk is level with your thighs. You should feel the muscles in your buttocks and back of your thighs working. If you do not feel these muscles, slide your feet 1-2 inches further away from your buttocks.  Hold this position for __________ seconds.  Slowly lower your hips to the starting position and allow your buttock muscles relax completely before beginning the next repetition.  If this exercise is too easy, you may cross your arms over your chest. Repeat __________ times. Complete this exercise __________ times per day.  STRENGTH - Hip Abductors, Straight Leg Raises  Be aware of your form throughout the entire exercise so that you  exercise the correct muscles. Sloppy form means that you are not strengthening the correct muscles.  Lie on your side so that your head, shoulders, knee and hip line up. You may bend your lower knee to help maintain your balance. Your right / left leg should be on top.  Roll your hips slightly forward, so that your hips are stacked directly over each other and your right / left knee is facing forward.  Lift your top leg up 4-6 inches, leading with your heel. Be sure that your foot does not drift forward or that your knee does not roll toward the ceiling.  Hold this position for __________ seconds. You should feel the muscles in your outer hip lifting (you may not notice this until your leg begins to tire).  Slowly lower your leg to the starting position. Allow the muscles to fully relax before  beginning the next repetition. Repeat __________ times. Complete this exercise __________ times per day.  STRENGTH - Hip Adductors, Straight Leg Raises   Lie on your side so that your head, shoulders, knee and hip line up. You may place your upper foot in front to help maintain your balance. Your right / left leg should be on the bottom.  Roll your hips slightly forward, so that your hips are stacked directly over each other and your right / left knee is facing forward.  Tense the muscles in your inner thigh and lift your bottom leg 4-6 inches. Hold this position for __________ seconds.  Slowly lower your leg to the starting position. Allow the muscles to fully relax before beginning the next repetition. Repeat __________ times. Complete this exercise __________ times per day.  STRENGTH - Quadriceps, Straight Leg Raises  Quality counts! Watch for signs that the quadriceps muscle is working to insure you are strengthening the correct muscles and not "cheating" by substituting with healthier muscles.  Lay on your back with your right / left leg extended and your opposite knee bent.  Tense the muscles in the front of your right / left thigh. You should see either your knee cap slide up or increased dimpling just above the knee. Your thigh may even quiver.  Tighten these muscles even more and raise your leg 4 to 6 inches off the floor. Hold for right / left seconds.  Keeping these muscles tense, lower your leg.  Relax the muscles slowly and completely in between each repetition. Repeat __________ times. Complete this exercise __________ times per day.  STRENGTH - Hip Abductors, Standing  Tie one end of a rubber exercise band/tubing to a secure surface (table, pole) and tie a loop at the other end.  Place the loop around your right / left ankle. Keeping your ankle with the band directly opposite of the secured end, step away until there is tension in the tube/band.  Hold onto a chair as  needed for balance.  Keeping your back upright, your shoulders over your hips, and your toes pointing forward, lift your right / left leg out to your side. Be sure to lift your leg with your hip muscles. Do not "throw" your leg or tip your body to lift your leg.  Slowly and with control, return to the starting position. Repeat exercise __________ times. Complete this exercise __________ times per day.  STRENGTH - Quadriceps, Squats  Stand in a door frame so that your feet and knees are in line with the frame.  Use your hands for balance, not support, on the frame.  Slowly lower your weight, bending at the hips and knees. Keep your lower legs upright so that they are parallel with the door frame. Squat only within the range that does not increase your knee pain. Never let your hips drop below your knees.  Slowly return upright, pushing with your legs, not pulling with your hands. Document Released: 06/16/2005 Document Revised: 08/21/2011 Document Reviewed: 09/10/2008 [facility] Patient Information 2015 [place], [place]. This information is not intended to replace advice given to you by your health care provider. Make sure you discuss any questions you have with your health care provider.

## 2014-04-28 NOTE — Progress Notes (Addendum)
Subjective:    Patient ID: Angela Cummings, female    DOB: [birth year], 58 y.o.   MRN: [id number] This chart was scribed for Delman Cheadle, MD by Marti Sleigh, Medical Scribe. This patient was seen in Room 8 and the patient's care was started a 10:30 AM.  Chief Complaint  Patient presents with  . Hip Pain    left hip pain; started last Sunday; pain was worse last night  . Flu Vaccine  . Medication Refill    Zoloft    HPI Past Medical History  Diagnosis Date  . Atrial fibrillation 11/2010    paroxysmal  . Bradycardia   . DJD (degenerative joint disease)   . Obesity   . Mild sleep apnea   . Asthma   . Anxiety   . Depression    Allergies  Allergen Reactions  . Adhesive [Tape] Itching and Rash   Current Outpatient Prescriptions on File Prior to Visit  Medication Sig Dispense Refill  . albuterol (PROVENTIL HFA;VENTOLIN HFA) 108 (90 BASE) MCG/ACT inhaler Inhale 1 puff into the lungs every 6 (six) hours as needed for wheezing.     . celecoxib (CELEBREX) 200 MG capsule Take 200 mg by mouth daily.    Marland Kitchen diltiazem (CARDIZEM CD) 180 MG 24 hr capsule Take 180 mg by mouth daily.    . pantoprazole (PROTONIX) 40 MG tablet Take 1 tablet (40 mg total) by mouth daily. 60 tablet 0  . propafenone (RYTHMOL) 225 MG tablet TAKE 1 TABLET BY MOUTH TWICE DAILY 60 tablet 6  . rivaroxaban (XARELTO) 20 MG TABS tablet TAKE 1 TABLET BY MOUTH DAILY 30 tablet 3  . sertraline (ZOLOFT) 50 MG tablet Take 25 mg by mouth daily.     Current Facility-Administered Medications on File Prior to Visit  Medication Dose Route Frequency Provider Last Rate Last Dose  . 0.9 %  sodium chloride infusion  250 mL Intravenous Continuous Thompson Grayer, MD        HPI Comments: Angela Cummings is a 58 y.o. female with a past hx of arthritis and asthma who presents to [facility] complaining of an acute exacerbation of chronic non radiating left hip pain that became worse yesterday. Pt endorses difficulty walking yesterday due to pain.  Pt endorses associated sleep disturbance due to hip pain. Pt denies weakness, numbness, or tingling in extremities. Pt took a medication for inflammation yesterday that was left over from her husband's medications with some relief, and states he will call in and let [facility] know what it was. Pt takes two Celebrex per day. Pt takes zoloft as needed.  Pt states she is taking care of a 2 old baby at home.  Pt's mother had a hx of prescription drug abuse, and is concerned about taking prescription drugs.  Pt's husband recently passed away.    Review of Systems  Constitutional: Positive for fatigue. Negative for fever and chills.  HENT: Positive for congestion.   Musculoskeletal: Positive for arthralgias.       Left lower Hip pain.  Psychiatric/Behavioral: Positive for sleep disturbance.       Objective:  BP 120/82 mmHg  Pulse 60  Temp(Src) 97.8 F (36.6 C) (Oral)  Resp 16  Ht 5\' 1"  (1.549 m)  Wt 224 lb 6.4 oz (101.787 kg)  BMI 42.42 kg/m2  SpO2 99%  Physical Exam  Constitutional: She is oriented to person, place, and time. She appears well-developed and well-nourished.  Pt walks with a slight limp.  HENT:  Head: Normocephalic and atraumatic.  Eyes: Pupils are equal, round, and reactive to light.  Neck: Neck supple.  Cardiovascular: Normal rate and regular rhythm.   Pulmonary/Chest: Effort normal and breath sounds normal. No respiratory distress.  Musculoskeletal: She exhibits tenderness.  Point tenderness over upper lumbar spinous processes. Left greater than right tenderness over upper lumbar paraspinal muscles. Significant tenderness over left SI joint. TTP over posterior trochanter as well as lateral left trochanter. Mildly reduced ROM in right hip with flexion and extension, ROM moderately reduced in left hip with flexion and extension.   Neurological: She is alert and oriented to person, place, and time.  4+ patellar reflexes, 3+   Skin: Skin is warm and dry.    Psychiatric: She has a normal mood and affect. Her behavior is normal.  Nursing note and vitals reviewed.      Assessment & Plan:   Pt will try samples of diclofenac tonight. Pt will call tomorrow and let [facility] know if she finds relief from that medication.  Pt was advised to take her zoloft daily. Advised if sx continue to return for x-ray. Hip pain, acute, left  Osteoarthritis of left hip, unspecified osteoarthritis type  Meds ordered this encounter  Medications  . sertraline (ZOLOFT) 50 MG tablet    Sig: Take 0.5 tablets (25 mg total) by mouth daily.    Dispense:  90 tablet    Refill:  1  . Diclofenac-Misoprostol 75-0.2 MG TBEC    Sig: Take 1 tablet by mouth 2 (two) times daily.    Dispense:  60 tablet    Refill:  2  . HYDROcodone-acetaminophen (NORCO/VICODIN) 5-325 MG per tablet    Sig: Take 1 tablet by mouth every 6 (six) hours as needed for moderate pain.    Dispense:  30 tablet    Refill:  0    I personally performed the services described in this documentation, which was scribed in my presence. The recorded information has been reviewed and considered, and addended by me as needed.  Delman Cheadle, MD MPH

## 2014-05-16 ENCOUNTER — Telehealth: Payer: Self-pay | Admitting: Internal Medicine

## 2014-05-18 NOTE — Telephone Encounter (Signed)
Will do!

## 2014-05-19 ENCOUNTER — Other Ambulatory Visit: Payer: Self-pay | Admitting: *Deleted

## 2014-05-19 MED ORDER — DILTIAZEM HCL ER COATED BEADS 180 MG PO CP24: 180.0000 mg | ORAL_CAPSULE | Freq: Every day | ORAL | Status: DC

## 2014-05-21 ENCOUNTER — Encounter (HOSPITAL_COMMUNITY): Payer: Self-pay | Admitting: Internal Medicine

## 2014-05-26 ENCOUNTER — Ambulatory Visit (INDEPENDENT_AMBULATORY_CARE_PROVIDER_SITE_OTHER): Payer: 59 | Admitting: Family Medicine

## 2014-05-26 ENCOUNTER — Telehealth: Payer: Self-pay | Admitting: Physician Assistant

## 2014-05-26 VITALS — BP 132/80 | HR 55 | Temp 98.0°F | Resp 16 | Ht 61.0 in | Wt 224.8 lb

## 2014-05-26 DIAGNOSIS — M545 Low back pain, unspecified: Secondary | ICD-10-CM

## 2014-05-26 DIAGNOSIS — S39012A Strain of muscle, fascia and tendon of lower back, initial encounter: Secondary | ICD-10-CM

## 2014-05-26 DIAGNOSIS — R3129 Other microscopic hematuria: Secondary | ICD-10-CM

## 2014-05-26 DIAGNOSIS — R312 Other microscopic hematuria: Secondary | ICD-10-CM

## 2014-05-26 DIAGNOSIS — Z23 Encounter for immunization: Secondary | ICD-10-CM

## 2014-05-26 LAB — POCT URINALYSIS DIPSTICK
Bilirubin, UA: NEGATIVE
Glucose, UA: NEGATIVE
Ketones, UA: NEGATIVE
Leukocytes, UA: NEGATIVE
Nitrite, UA: NEGATIVE
PH UA: 5.5
Protein, UA: NEGATIVE
Spec Grav, UA: <=1.005
Urobilinogen, UA: 0.2

## 2014-05-26 LAB — POCT UA - MICROSCOPIC ONLY
CRYSTALS, UR, HPF, POC: NEGATIVE
Casts, Ur, LPF, POC: NEGATIVE
Mucus, UA: NEGATIVE
WBC, UR, HPF, POC: 0
YEAST UA: NEGATIVE

## 2014-05-26 MED ORDER — CYCLOBENZAPRINE HCL 5 MG PO TABS: 5.0000 mg | ORAL_TABLET | Freq: Three times a day (TID) | ORAL | Status: DC | PRN

## 2014-05-26 MED ORDER — FLEXERIL 5 MG PO TABS: 5.0000 mg | ORAL_TABLET | Freq: Three times a day (TID) | ORAL | Status: DC | PRN

## 2014-05-26 NOTE — Telephone Encounter (Signed)
Rx was written as DAW. Please advise if this is necessary. Pt states she does not require it to be name brand.   I have pended a new prescription to send- pharmacy requires this.

## 2014-05-26 NOTE — Telephone Encounter (Signed)
Patient is requesting a hand written note for her script for a muscle relaxer.  [facility] will have to order it and it won't be available today, she needs releif today   [phone]

## 2014-05-26 NOTE — Progress Notes (Signed)
Subjective:    Patient ID: Angela Cummings, female    DOB: [birth year], 58 y.o.   MRN: [id number]  HPI  This is a 58 year old female with PMH atrial fibrillation and hip arthritis who is presenting with right sided back pain x 1 month. She was seen at [facility] 1 month ago for left hip pain. She notes at that time she was having some slight right back pain but it was not bothering her too much. She was prescribed norco and diclofenac-misoprostol for her hip pain which she states worked well. Two weeks ago her back pain increased. She cannot recall an injury. The pain is located to right lumbar region. It is non-radiating. Movement makes worse. She denies weakness, problems with bowel/bladder or paresthesias. She has never had a significant back injury before.  She is taking care of her 31 month old grandchild at home. Her husband died 4 months ago from bone cancer.  Review of Systems  Constitutional: Negative for fever and chills.  Gastrointestinal: Negative for nausea, vomiting, abdominal pain, diarrhea and blood in stool.  Genitourinary: Negative for dysuria, frequency, hematuria, flank pain and difficulty urinating.  Musculoskeletal: Positive for back pain.  Skin: Negative for color change.  Neurological: Negative for headaches.  Psychiatric/Behavioral: Positive for sleep disturbance.   Patient Active Problem List   Diagnosis Date Noted  . Situational depression 02/18/2014  . Chest pain 05/08/2013  . Shortness of breath 05/08/2013  . Hypokalemia 07/26/2012  . Atrial fibrillation with RVR 03/09/2012  . Arthritis 03/09/2012  . Asthma   . Atrial fibrillation 11/28/2010  . Obesity 11/28/2010   Prior to Admission medications   Medication Sig Start Date End Date Taking? Authorizing Provider  albuterol (PROVENTIL HFA;VENTOLIN HFA) 108 (90 BASE) MCG/ACT inhaler Inhale 1 puff into the lungs every 6 (six) hours as needed for wheezing.    Yes Historical Provider, MD  Diclofenac-Misoprostol  75-0.2 MG TBEC Take 1 tablet by mouth 2 (two) times daily. 04/28/14  Yes Shawnee Knapp, MD  diltiazem (CARDIZEM CD) 180 MG 24 hr capsule Take 1 capsule (180 mg total) by mouth daily. 05/19/14  Yes Coralyn Mark, MD  HYDROcodone-acetaminophen (NORCO/VICODIN) 5-325 MG per tablet Take 1 tablet by mouth every 6 (six) hours as needed for moderate pain. 04/28/14  Yes Shawnee Knapp, MD  pantoprazole (PROTONIX) 40 MG tablet Take 1 tablet (40 mg total) by mouth daily. 11/14/13  Yes Coralyn Mark, MD  propafenone (RYTHMOL) 225 MG tablet TAKE 1 TABLET BY MOUTH TWICE DAILY 04/15/14  Yes Coralyn Mark, MD  rivaroxaban (XARELTO) 20 MG TABS tablet TAKE 1 TABLET BY MOUTH DAILY 04/15/14  Yes Coralyn Mark, MD  sertraline (ZOLOFT) 50 MG tablet Take 0.5 tablets (25 mg total) by mouth daily. 04/28/14  Yes Shawnee Knapp, MD   Allergies  Allergen Reactions  . Adhesive [Tape] Itching and Rash   Patient's social and family history were reviewed.     Objective:   Physical Exam  Constitutional: She is oriented to person, place, and time. She appears well-developed and well-nourished. No distress.  HENT:  Head: Normocephalic and atraumatic.  Right Ear: Hearing normal.  Left Ear: Hearing normal.  Nose: Nose normal.  Eyes: Conjunctivae and lids are normal. Right eye exhibits no discharge. Left eye exhibits no discharge. No scleral icterus.  Cardiovascular: Normal rate, regular rhythm, intact distal pulses and normal pulses.   Pulmonary/Chest: Effort normal. No respiratory distress.  Abdominal: Soft. Normal appearance and bowel  sounds are normal. There is no tenderness. There is no rebound and no guarding.  Musculoskeletal:       Lumbar back: She exhibits decreased range of motion (due to pain. decreased extension, lateral flexion and rotation) and tenderness (right paraspinal muscles). She exhibits no bony tenderness.  Straight leg raise negative  Neurological: She is alert and oriented to person, place, and time. She has  normal strength. No sensory deficit.  Reflex Scores:      Patellar reflexes are 4+ on the right side and 4+ on the left side.      Achilles reflexes are 3+ on the right side and 3+ on the left side. Skin: Skin is warm, dry and intact. No lesion and no rash noted.  Psychiatric: She has a normal mood and affect. Her speech is normal and behavior is normal. Thought content normal.   Results for orders placed or performed in visit on 05/26/14  POCT UA - Microscopic Only  Result Value Ref Range   WBC, Ur, HPF, POC 0    RBC, urine, microscopic 1-2    Bacteria, U Microscopic trace    Mucus, UA neg    Epithelial cells, urine per micros 6-15    Crystals, Ur, HPF, POC neg    Casts, Ur, LPF, POC neg    Yeast, UA neg   POCT urinalysis dipstick  Result Value Ref Range   Color, UA yellow    Clarity, UA clear    Glucose, UA neg    Bilirubin, UA neg    Ketones, UA neg    Spec Grav, UA <=1.005    Blood, UA mod    pH, UA 5.5    Protein, UA neg    Urobilinogen, UA 0.2    Nitrite, UA .neg    Leukocytes, UA Negative       Assessment & Plan:  1. Right-sided low back pain without sciatica 2. Back strain, initial encounter 3. Microscopic hematuria Patient has norco at home as well as diclofenac. She will continue with BID diclofenac. She is wary of taking norco because her mother was addicted to pain medication. Flexeril was prescribed for muscle spasm. UA today demonstrated moderate blood. Previous UAs show small amount of blood. She reports many years ago she saw a urologist but she did not follow up with them. She will return for evaluation. She will return if back pain does not improve in 10-14 days.  - POCT UA - Microscopic Only - POCT urinalysis dipstick - Ambulatory referral to Urology  2. Need for influenza vaccination - Flu Vaccine QUAD 36+ mos IM   Benjaman Pott. Drenda Freeze, MHS [facility]  05/26/2014

## 2014-05-26 NOTE — Telephone Encounter (Signed)
Patient states that her pharmacy does not have the Flexeril but they have the generic form. Can patient have this instead?  [phone]

## 2014-05-26 NOTE — Patient Instructions (Signed)
Take flexeril three times a day as needed - may take 10 mg at night to help you sleep. Take vicodin as needed for pain. Continue with diclofenac twice a day. You will get a call to make urology appt. Return if symptoms worsen at any time or if no improvement in 10-14 days.

## 2014-06-11 NOTE — Progress Notes (Signed)
Reviewed documentation and agree w/ assessment and plan. Jeani Fassnacht, MD MPH 

## 2014-08-20 ENCOUNTER — Other Ambulatory Visit: Payer: Self-pay | Admitting: Internal Medicine

## 2014-08-24 ENCOUNTER — Other Ambulatory Visit: Payer: Self-pay | Admitting: Internal Medicine

## 2014-08-24 ENCOUNTER — Other Ambulatory Visit: Payer: Self-pay | Admitting: Family Medicine

## 2014-09-14 IMAGING — CT CT HEART MORP W/ CTA COR W/ SCORE W/ CA W/CM &/OR W/O CM
1 of 9 series · 1 of 20 positions shown, 2 images · non-contrast
Comparison: None.

CLINICAL DATA: Chest pain, recurrent paroxysmal atrial
fibrillation, s/p pulmonary vein ablation

EXAM:
Cardiac/Coronary  CT
TECHNIQUE: The patient was scanned on a Philips 256 scanner.

[Series 300: locator · axial · 0.35mm/px · z∈[-181,-181]mm · 1 of 1 slices shown, 2 images]
[im 1/1  vessel]
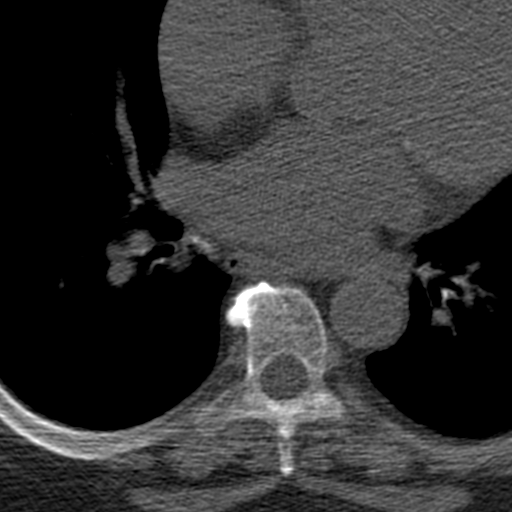
[im 1/1  lung]
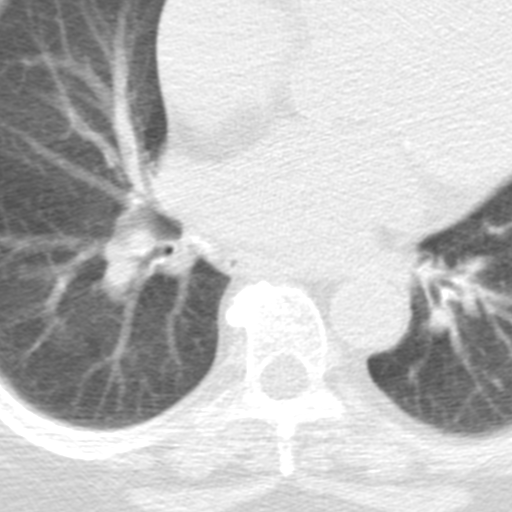

[1 of 20 positions shown; findings below may reference images not displayed]

FINDINGS: A 120 kV prospective scan was triggered in the descending thoracic
aorta at 111 HU's. Axial non-contrast 3mm slices were carried out
through the heart. The data set was analyzed on a dedicated work
station and scored using the Agatson method. Gantry rotation speed
was 270 msecs and collimation was .9 mm. No beta blockade and 0.4 mg
of sl NTG was given. The 3D data set was reconstructed in 5%
intervals of the 67-82 % of the R-R cycle. Diastolic phases were
analyzed on a dedicated work station using MPR, MIP and VRT modes.
The patient received 100 cc of contrast.

1. Coronary Arteries: Originating in normal position. Right
dominance.

Left main is a large vessel that gives rise to LAD, ramus
intermedius and LCX. Left main is a large vessel without any
atherosclerotic plague.

LAD is a large vessel that wraps around the apex and continue in the
inferior interventricular groove. There is no plague. D1 is a small
vessel without plague.

LCX is a moderate caliber nom-dominant vessel that gives rise to one
OM branch. There is no plague in the LCX/OM1.

RCA is a large dominant vessel that gives rise to PDA and PLVB.
There is no plague in the RCA/PDA.

2. Pulmonary veins: There are four pulmonary vein draining into left
atrium in a regular position - two on the right and two on the left.
There is no evidence of pulmonary vein stenosis. The measurement are
as follows:

RUPV:  25 x 16 mm

RLPV:  17 x 17 mm

LUPV:  28 x 18 mm

LLPV:  20 x 9 mm

3. There is a large left atrial appendage without evidence of
filling defect.

4. Aorta: Normal size aortic root (31 x 31 x 31 mm), STJ (28 x 29
mm) and ascending aorta (30 x 30 mm). Aortic Valve: Trileaflet, no
calcifications.
IMPRESSION: 1. Coronary calcium score of 0. This was 0 percentile for age and
sex matched control.

2. Normal origin of the coronary arteries. Right dominance. No
evidence of atherosclerotic plague.

3. There are four pulmonary veins draining into left atrium in a
regular position without evidence of pulmonary stenosis.

Reymond Salamon

EXAM:
OVER-READ INTERPRETATION  CT CHEST

The following report is an over-read performed by radiologist Dr.
over-read does not include interpretation of cardiac or coronary
anatomy or pathology. The coronary CTA interpretation by the
cardiologist is attached.
FINDINGS: Lungs are essentially clear. No suspicious pulmonary nodules. No
pleural effusion or pneumothorax.

No suspicious thoracic lymphadenopathy.

Visualized upper abdomen is notable for postsurgical changes related
to suspected prior gastric bypass as well as a prior
cholecystectomy.

Degenerative changes of the visualized thoracic spine. No focal
osseous lesions.
IMPRESSION: No significant extracardiac findings.

## 2014-09-18 ENCOUNTER — Encounter: Payer: Self-pay | Admitting: Physician Assistant

## 2014-09-18 ENCOUNTER — Ambulatory Visit (INDEPENDENT_AMBULATORY_CARE_PROVIDER_SITE_OTHER): Payer: 59 | Admitting: Physician Assistant

## 2014-09-18 DIAGNOSIS — I480 Paroxysmal atrial fibrillation: Secondary | ICD-10-CM

## 2014-09-18 MED ORDER — PROPAFENONE HCL 225 MG PO TABS: ORAL_TABLET | ORAL | Status: DC

## 2014-09-18 MED ORDER — RIVAROXABAN 20 MG PO TABS: ORAL_TABLET | ORAL | Status: DC

## 2014-09-18 MED ORDER — DILTIAZEM HCL 30 MG PO TABS: 30.0000 mg | ORAL_TABLET | Freq: Every day | ORAL | Status: DC | PRN

## 2014-09-18 MED ORDER — DILTIAZEM HCL ER COATED BEADS 180 MG PO CP24: 180.0000 mg | ORAL_CAPSULE | Freq: Every day | ORAL | Status: DC

## 2014-09-18 NOTE — Patient Instructions (Addendum)
We have refilled the medications as she requested.  When you have palpitations, take a Cardizem 30 mg tablet. If her symptoms do not improve in one hour, he can take another one but be careful because after you convert back to sinus rhythm, your heart rate may be slow.  Keep a symptom diary so that when you follow up with Dr. [name], you can tell him the frequency of your atrial fibrillation episodes, their duration, and how high her heart rate gets.  Continue taking her other medications as prescribed.  Follow-up with Dr. [name] in 2 months OR NEXT AVAILABLE APPOINTMENT

## 2014-09-18 NOTE — Progress Notes (Deleted)
Cardiology Office Note   Date:  09/18/2014   ID:  Angela Cummings, DOB 1956-05-21, MRN [id number]  PCP:  Kennon Portela, MD  Cardiologist:  Suanne Marker Ansar Skoda, PA-C   No chief complaint on file.   History of Present Illness: Angela Cummings is a 59 y.o. female with a history of ***  He presents for ***   Past Medical History  Diagnosis Date  . Atrial fibrillation 11/2010    paroxysmal  . Bradycardia   . DJD (degenerative joint disease)   . Obesity   . Mild sleep apnea   . Asthma   . Anxiety   . Depression     Past Surgical History  Procedure Laterality Date  . Cholecystectomy    . Cesarean section    . Knee surgery    . Neck tumor resection      benign  . Hernia repair    . Tee without cardioversion  03/13/2012    Procedure: TRANSESOPHAGEAL ECHOCARDIOGRAM (TEE);  Surgeon: [name], MD;  Location: [place];  Service: Cardiovascular;  Laterality: N/A;  Rm 2029  . Cardioversion  03/13/2012    Procedure: CARDIOVERSION;  Surgeon: [name], MD;  Location: [place];  Service: Cardiovascular;  Laterality: N/A;  . Cardioversion N/A 07/26/2012    Procedure: CARDIOVERSION;  Surgeon: Thompson Grayer, MD;  Location: [place];  Service: Cardiovascular;  Laterality: N/A;  . Tee without cardioversion N/A 09/02/2012    Procedure: TRANSESOPHAGEAL ECHOCARDIOGRAM (TEE);  Surgeon: Josue Hector, MD;  Location: [facility] ENDOSCOPY;  Service: Cardiovascular;  Laterality: N/A;  . Ablation  08/2012    PVI by Dr [name]  . Tee without cardioversion N/A 11/13/2013    Procedure: TRANSESOPHAGEAL ECHOCARDIOGRAM (TEE);  Surgeon: [name], MD;  Location: [place];  Service: Cardiovascular;  Laterality: N/A;  . Ablation  11/13/2013    PVI by Dr [name]  . Cardioversion N/A 02/20/2014    Procedure: CARDIOVERSION;  Surgeon: Larey Dresser, MD;  Location[facility] ENDOSCOPY;  Service: Cardiovascular;  Laterality: N/A;  . Atrial fibrillation ablation N/A 09/03/2012    Procedure:  ATRIAL FIBRILLATION ABLATION;  Surgeon: Thompson Grayer, MD;  Location: [facility] CATH LAB;  Service: Cardiovascular;  Laterality: N/A;  . Atrial fibrillation ablation N/A 11/13/2013    Procedure: ATRIAL FIBRILLATION ABLATION;  Surgeon: Coralyn Mark, MD;  Location: [place] CATH LAB;  Service: Cardiovascular;  Laterality: N/A;    Current Outpatient Prescriptions  Medication Sig Dispense Refill  . albuterol (PROVENTIL HFA;VENTOLIN HFA) 108 (90 BASE) MCG/ACT inhaler Inhale 1 puff into the lungs every 6 (six) hours as needed for wheezing.     Marland Kitchen CARTIA XT 180 MG 24 hr capsule TAKE ONE CAPSULE BY MOUTH DAILY 30 capsule 0  . CARTIA XT 180 MG 24 hr capsule TAKE ONE CAPSULE BY MOUTH DAILY 30 capsule 0  . cyclobenzaprine (FLEXERIL) 5 MG tablet Take 1 tablet (5 mg total) by mouth 3 (three) times daily as needed for muscle spasms. 40 tablet 0  . Diclofenac-Misoprostol 75-0.2 MG TBEC TAKE 1 TABLET BY MOUTH TWICE DAILY 60 tablet 2  . HYDROcodone-acetaminophen (NORCO/VICODIN) 5-325 MG per tablet Take 1 tablet by mouth every 6 (six) hours as needed for moderate pain. 30 tablet 0  . pantoprazole (PROTONIX) 40 MG tablet Take 1 tablet (40 mg total) by mouth daily. 60 tablet 0  . propafenone (RYTHMOL) 225 MG tablet TAKE 1 TABLET BY MOUTH TWICE DAILY 60 tablet 6  . rivaroxaban (XARELTO)  20 MG TABS tablet TAKE 1 TABLET BY MOUTH DAILY 30 tablet 3  . sertraline (ZOLOFT) 50 MG tablet Take 0.5 tablets (25 mg total) by mouth daily. 90 tablet 1   Current Facility-Administered Medications  Medication Dose Route Frequency Provider Last Rate Last Dose  . 0.9 %  sodium chloride infusion  250 mL Intravenous Continuous Thompson Grayer, MD        Allergies:   Adhesive    Social History:  The patient  reports that she has never smoked. She has never used smokeless tobacco. She reports that she does not drink alcohol or use illicit drugs.   Family History:  The patient's family history includes Cancer in her daughter, father, and sister;  Diabetes in her other; Emphysema in her brother and mother; Thyroid disease in her other.    ROS:  Please see the history of present illness. All other systems are reviewed and negative.    PHYSICAL EXAM: VS:  There were no vitals taken for this visit. , BMI There is no weight on file to calculate BMI. GEN: Well nourished, well developed, in no acute distress HEENT: normal Neck: no JVD, carotid bruits, or masses Cardiac: RRR; no murmurs, rubs, or gallops,no edema  Respiratory:  clear to auscultation bilaterally, normal work of breathing GI: soft, nontender, nondistended, + BS MS: no deformity or atrophy Skin: warm and dry, no rash Neuro:  Strength and sensation are intact Psych: euthymic mood, full affect   EKG:  EKG {ACTION; IS/IS YEM:33612244} ordered today. The ekg ordered today demonstrates ***   Recent Labs: 02/18/2014: BUN 8; Creatinine 0.9; Hemoglobin 12.1; Platelets 278.0; Potassium 4.3; Sodium 138    Lipid Panel    Component Value Date/Time   CHOL 143 03/10/2012 0303   TRIG 57 03/10/2012 0303   HDL 70 03/10/2012 0303   CHOLHDL 2.0 03/10/2012 0303   VLDL 11 03/10/2012 0303   LDLCALC 62 03/10/2012 0303     Wt Readings from Last 3 Encounters:  05/26/14 224 lb 12.8 oz (101.969 kg)  04/28/14 224 lb 6.4 oz (101.787 kg)  02/20/14 220 lb (99.791 kg)     Other studies Reviewed: Additional studies/ records that were reviewed today include: ***. Review of the above records demonstrates: ***   ASSESSMENT AND PLAN:  1.  ***   Current medicines are reviewed at length with the patient today.  The patient {ACTIONS; HAS/DOES NOT HAVE:19233} concerns regarding medicines.  The following changes have been made:  {PLAN; NO CHANGE:13088:s}  Labs/ tests ordered today include: *** No orders of the defined types were placed in this encounter.     Disposition:   FU with *** in {gen number [id number]} {TIME; UNITS DAY/WEEK/MONTH:19136}   Signed, Rosaria Ferries, PA-C    09/18/2014 11:29 AM    [facility] [address], [place], [place]  [address] Phone: [phone]; Fax: [phone]

## 2014-09-18 NOTE — Progress Notes (Signed)
Cardiology Office Note   Date:  09/18/2014   ID:  Angela Cummings, DOB 09/27/1955, MRN [id number]  PCP:  Kennon Portela, MD  Cardiologist:  Dr [name] (was Dr. Einar Gip)  Rosaria Ferries, PA-C   PAF   History of Present Illness: Angela Cummings is a 59 y.o. female with a history of atrial fib, s/p ablation 11/2013.   She has noticed more frequent episodes of atrial fibrillation and they're bothering her. She gets symptomatic with them. When she is in sinus rhythm, her resting heart rate is in the high 50s at times.  She is compliant with her medications including diltiazem Rhythmol, and Xarelto. She feels she is having at least one episode of atrial fibrillation per week. They resolve spontaneously but she is concerned. She has not had presyncope or syncope. She does get chest pain with the atrial fibrillation, and her heart rate is always high.  She was reassured when I advised her that the cardiac CT last year showed no CAD.  Past Medical History  Diagnosis Date  . Atrial fibrillation 11/2010    paroxysmal  . Bradycardia   . DJD (degenerative joint disease)   . Obesity   . Mild sleep apnea   . Asthma   . Anxiety   . Depression     Past Surgical History  Procedure Laterality Date  . Cholecystectomy    . Cesarean section    . Knee surgery    . Neck tumor resection      benign  . Hernia repair    . Tee without cardioversion  03/13/2012    Procedure: TRANSESOPHAGEAL ECHOCARDIOGRAM (TEE);  Surgeon: [name], MD;  Location: [place];  Service: Cardiovascular;  Laterality: N/A;  Rm 2029  . Cardioversion  03/13/2012    Procedure: CARDIOVERSION;  Surgeon: [name], MD;  Location: [place];  Service: Cardiovascular;  Laterality: N/A;  . Cardioversion N/A 07/26/2012    Procedure: CARDIOVERSION;  Surgeon: Thompson Grayer, MD;  Location: [place];  Service: Cardiovascular;  Laterality: N/A;  . Tee without cardioversion N/A 09/02/2012    Procedure:  TRANSESOPHAGEAL ECHOCARDIOGRAM (TEE);  Surgeon: Josue Hector, MD;  Location: [facility] ENDOSCOPY;  Service: Cardiovascular;  Laterality: N/A;  . Ablation  08/2012    PVI by Dr [name]  . Tee without cardioversion N/A 11/13/2013    Procedure: TRANSESOPHAGEAL ECHOCARDIOGRAM (TEE);  Surgeon: [name], MD;  Location: [place];  Service: Cardiovascular;  Laterality: N/A;  . Ablation  11/13/2013    PVI by Dr [name]  . Cardioversion N/A 02/20/2014    Procedure: CARDIOVERSION;  Surgeon: Larey Dresser, MD;  Location: [facility] ENDOSCOPY;  Service: Cardiovascular;  Laterality: N/A;  . Atrial fibrillation ablation N/A 09/03/2012    Procedure: ATRIAL FIBRILLATION ABLATION;  Surgeon: Thompson Grayer, MD;  Location: [facility] CATH LAB;  Service: Cardiovascular;  Laterality: N/A;  . Atrial fibrillation ablation N/A 11/13/2013    Procedure: ATRIAL FIBRILLATION ABLATION;  Surgeon: Coralyn Mark, MD;  Location: [place] CATH LAB;  Service: Cardiovascular;  Laterality: N/A;    Current Outpatient Prescriptions  Medication Sig Dispense Refill  . albuterol (PROVENTIL HFA;VENTOLIN HFA) 108 (90 BASE) MCG/ACT inhaler Inhale 1 puff into the lungs every 6 (six) hours as needed for wheezing.     . Diclofenac-Misoprostol 75-0.2 MG TBEC TAKE 1 TABLET BY MOUTH TWICE DAILY 60 tablet 2  . diltiazem (CARDIZEM) 30 MG tablet Take 1 tablet (30 mg total) by mouth daily as needed. 30 tablet  5  . diltiazem (CARTIA XT) 180 MG 24 hr capsule Take 1 capsule (180 mg total) by mouth daily. 30 capsule 0  . pantoprazole (PROTONIX) 40 MG tablet Take 1 tablet (40 mg total) by mouth daily. 60 tablet 0  . propafenone (RYTHMOL) 225 MG tablet TAKE 1 TABLET BY MOUTH TWICE DAILY 60 tablet 6  . rivaroxaban (XARELTO) 20 MG TABS tablet TAKE 1 TABLET BY MOUTH DAILY 30 tablet 6  . sertraline (ZOLOFT) 50 MG tablet Take 0.5 tablets (25 mg total) by mouth daily. 90 tablet 1   Current Facility-Administered Medications  Medication Dose Route Frequency Provider Last Rate Last  Dose  . 0.9 %  sodium chloride infusion  250 mL Intravenous Continuous Thompson Grayer, MD        Allergies:   Adhesive    Social History:  The patient  reports that she has never smoked. She has never used smokeless tobacco. She reports that she does not drink alcohol or use illicit drugs.   Family History:  The patient's family history includes Cancer in her daughter, father, and sister; Diabetes in her other; Emphysema in her brother and mother; Thyroid disease in her other.    ROS:  Please see the history of present illness. All other systems are reviewed and negative.    PHYSICAL EXAM: VS: Blood pressure 136/69, heart rate 57, weight 223 pounds, height 5 feet 1 inches GEN: Well nourished, well developed, in no acute distress HEENT: normal Neck: no JVD, carotid bruits, or masses Cardiac: RRR; no murmurs, rubs, or gallops,no edema  Respiratory:  clear to auscultation bilaterally, normal work of breathing GI: soft, nontender, nondistended, + BS MS: no deformity or atrophy Skin: warm and dry, no rash Neuro:  Strength and sensation are intact Psych: euthymic mood, full affect   EKG:  EKG is not ordered today.  Recent Labs: 02/18/2014: BUN 8; Creatinine 0.9; Hemoglobin 12.1; Platelets 278.0; Potassium 4.3; Sodium 138    Lipid Panel    Component Value Date/Time   CHOL 143 03/10/2012 0303   TRIG 57 03/10/2012 0303   HDL 70 03/10/2012 0303   CHOLHDL 2.0 03/10/2012 0303   VLDL 11 03/10/2012 0303   LDLCALC 62 03/10/2012 0303     Wt Readings from Last 3 Encounters:  05/26/14 224 lb 12.8 oz (101.969 kg)  04/28/14 224 lb 6.4 oz (101.787 kg)  02/20/14 220 lb (99.791 kg)     Other studies Reviewed: Additional studies/ records that were reviewed today include: Old ECGs, records of ablation, cardiac CT Review of the above records demonstrates: No CAD   ASSESSMENT AND PLAN:  1. PAF: I discussed the situation with the patient. I advised her that her resting heart rate was too  low for me to increase her Cardizem. I advised her that her Rythmol dose was high enough I could not increase it. I advised her that I did not feel comfortable changing her antiarrhythmic medication without Dr. [name] being involved and he is not here today.  Advised her I felt the best solution was to start diltiazem 30 mg one by mouth twice a day when necessary for palpitations. We refilled her long-acting Cardizem, Rythmol and Xarelto as requested. Advised her to keep a symptom diary so she could tell Dr. [name] how many times at the week or month she is having the palpitations and how long Blair lasting. She is also to let us know if the when necessary Cardizem helps.   We'll schedule a follow-up with Dr.  Allred and she can discuss symptoms with him and additional treatment options.  Current medicines are reviewed at length with the patient today.  The patient does not have concerns regarding medicines.  The following changes have been made:  Cardizem 30 mg, one by mouth twice a day when necessary palpitations  Labs/ tests ordered today include: None   Disposition:   FU with Dr. [name] in 3 months or first available  Signed, Rosaria Ferries, PA-C  09/18/2014 4:37 PM    [address] [address], [place], [place]  [address] Phone: [phone]; Fax: [phone]

## 2014-11-18 ENCOUNTER — Other Ambulatory Visit: Payer: Self-pay | Admitting: Internal Medicine

## 2014-11-18 ENCOUNTER — Encounter: Payer: Self-pay | Admitting: Internal Medicine

## 2014-11-18 ENCOUNTER — Ambulatory Visit (INDEPENDENT_AMBULATORY_CARE_PROVIDER_SITE_OTHER): Payer: 59 | Admitting: Internal Medicine

## 2014-11-18 VITALS — BP 122/72 | HR 57 | Ht 61.0 in | Wt 220.6 lb

## 2014-11-18 DIAGNOSIS — I480 Paroxysmal atrial fibrillation: Secondary | ICD-10-CM

## 2014-11-18 DIAGNOSIS — E669 Obesity, unspecified: Secondary | ICD-10-CM

## 2014-11-18 MED ORDER — DILTIAZEM HCL ER COATED BEADS 180 MG PO CP24: 180.0000 mg | ORAL_CAPSULE | Freq: Every day | ORAL | Status: DC

## 2014-11-18 MED ORDER — RIVAROXABAN 20 MG PO TABS: ORAL_TABLET | ORAL | Status: DC

## 2014-11-18 MED ORDER — PROPAFENONE HCL 225 MG PO TABS: ORAL_TABLET | ORAL | Status: DC

## 2014-11-18 NOTE — Progress Notes (Signed)
PCP:  Kennon Portela, MD Primary Cardiologist: Dr Einar Gip  The patient presents today for routine electrophysiology followup.  She has occasional palpitations.  She continues to grieve the death of her husband.   Today, she denies symptoms of orthopnea, PND, lower extremity edema, dizziness, presyncope, syncope, or neurologic sequela.  The patient feels that she is tolerating medications without difficulties and is otherwise without complaint today.   Past Medical History  Diagnosis Date  . Atrial fibrillation 11/2010    paroxysmal  . Bradycardia   . DJD (degenerative joint disease)   . Obesity   . Mild sleep apnea   . Asthma   . Anxiety   . Depression    Past Surgical History  Procedure Laterality Date  . Cholecystectomy    . Cesarean section    . Knee surgery    . Neck tumor resection      benign  . Hernia repair    . Tee without cardioversion  03/13/2012    Procedure: TRANSESOPHAGEAL ECHOCARDIOGRAM (TEE);  Surgeon: [name], MD;  Location: [place];  Service: Cardiovascular;  Laterality: N/A;  Rm 2029  . Cardioversion  03/13/2012    Procedure: CARDIOVERSION;  Surgeon: [name], MD;  Location: [place];  Service: Cardiovascular;  Laterality: N/A;  . Cardioversion N/A 07/26/2012    Procedure: CARDIOVERSION;  Surgeon: Thompson Grayer, MD;  Location: [place];  Service: Cardiovascular;  Laterality: N/A;  . Tee without cardioversion N/A 09/02/2012    Procedure: TRANSESOPHAGEAL ECHOCARDIOGRAM (TEE);  Surgeon: Josue Hector, MD;  Location: [facility] ENDOSCOPY;  Service: Cardiovascular;  Laterality: N/A;  . Ablation  08/2012    PVI by Dr [name]  . Tee without cardioversion N/A 11/13/2013    Procedure: TRANSESOPHAGEAL ECHOCARDIOGRAM (TEE);  Surgeon: [name], MD;  Location: [place];  Service: Cardiovascular;  Laterality: N/A;  . Ablation  11/13/2013    PVI by Dr [name]  . Cardioversion N/A 02/20/2014    Procedure: CARDIOVERSION;  Surgeon: Larey Dresser, MD;   Location: [facility] ENDOSCOPY;  Service: Cardiovascular;  Laterality: N/A;  . Atrial fibrillation ablation N/A 09/03/2012    Procedure: ATRIAL FIBRILLATION ABLATION;  Surgeon: Thompson Grayer, MD;  Location: [facility] CATH LAB;  Service: Cardiovascular;  Laterality: N/A;  . Atrial fibrillation ablation N/A 11/13/2013    Procedure: ATRIAL FIBRILLATION ABLATION;  Surgeon: Coralyn Mark, MD;  Location: [place] CATH LAB;  Service: Cardiovascular;  Laterality: N/A;    Current Outpatient Prescriptions  Medication Sig Dispense Refill  . albuterol (PROVENTIL HFA;VENTOLIN HFA) 108 (90 BASE) MCG/ACT inhaler Inhale 1 puff into the lungs every 6 (six) hours as needed for wheezing.     . Diclofenac-Misoprostol 75-0.2 MG TBEC TAKE 1 TABLET BY MOUTH TWICE DAILY 60 tablet 2  . diltiazem (CARDIZEM) 30 MG tablet Take 1 tablet (30 mg total) by mouth daily as needed. 30 tablet 5  . diltiazem (CARTIA XT) 180 MG 24 hr capsule Take 1 capsule (180 mg total) by mouth daily. 90 capsule 3  . pantoprazole (PROTONIX) 40 MG tablet Take 1 tablet (40 mg total) by mouth daily. 60 tablet 0  . propafenone (RYTHMOL) 225 MG tablet TAKE 1 TABLET BY MOUTH TWICE DAILY 180 tablet 3  . rivaroxaban (XARELTO) 20 MG TABS tablet TAKE 1 TABLET BY MOUTH DAILY 90 tablet 3  . sertraline (ZOLOFT) 50 MG tablet Take 0.5 tablets (25 mg total) by mouth daily. 90 tablet 1   Current Facility-Administered Medications  Medication Dose Route Frequency Provider Last  Rate Last Dose  . 0.9 %  sodium chloride infusion  250 mL Intravenous Continuous Thompson Grayer, MD        Allergies  Allergen Reactions  . Adhesive [Tape] Itching and Rash    History   Social History  . Marital Status: Widowed    Spouse Name: N/A  . Number of Children: 3  . Years of Education: N/A   Occupational History  . Not on file.   Social History Main Topics  . Smoking status: Never Smoker   . Smokeless tobacco: Never Used  . Alcohol Use: No     Comment: Rarely  . Drug Use: No  . Sexual  Activity: No   Other Topics Concern  . Not on file   Social History Narrative   Married with 3 children (17,30, 52) and granddaughter (62).   Lives in [place]. Unemployed.    Family History  Problem Relation Age of Onset  . Diabetes Other   . Thyroid disease Other   . Emphysema Mother   . Cancer Father   . Cancer Sister   . Emphysema Brother   . Cancer Daughter     ROS-  All systems are reviewed and are negative except as outlined in the HPI above  Physical Exam: Filed Vitals:   11/18/14 1445  BP: 122/72  Pulse: 57  Height: 5\' 1"  (1.549 m)  Weight: 100.064 kg (220 lb 9.6 oz)    GEN- The patient is well appearing, alert and oriented x 3 today.   Head- normocephalic, atraumatic Eyes-  Sclera clear, conjunctiva pink Ears- hearing intact Oropharynx- clear Neck- supple, no JVP Lymph- no cervical lymphadenopathy Lungs- Clear to ausculation bilaterally, normal work of breathing Heart- regular rate and rhythm, no murmurs, rubs or gallops, PMI not laterally displaced GI- soft, NT, ND, + BS Extremities- no clubbing, cyanosis, or edema MS- no significant deformity or atrophy Skin- no rash or lesion Psych- euthymic mood, full affect Neuro- strength and sensation are intact  ekg today reveals afib with RVR, iRBBB  Assessment and Plan:  1. afib Reports compliance with xarelto  She had multiple atypical flutter circuits which could not be ablated previously.  I would therefore advise medical therapy long term.  2. Situational depression related to her husbands death Stable No change required today  3. Obesity Weight loss is necessary long term Body mass index is 41.7 kg/(m^2).  Return to see Roderic Palau in the afib clinic every 3 months and I will see when needed

## 2014-11-18 NOTE — Patient Instructions (Signed)
Medication Instructions:  Your physician recommends that you continue on your current medications as directed. Please refer to the Current Medication list given to you today.   Labwork: None ordered  Testing/Procedures: None ordered  Follow-Up: Your physician recommends that you schedule a follow-up appointment in: 3 months with Roderic Palau, NP   Any Other Special Instructions Will Be Listed Below (If Applicable).

## 2014-11-23 ENCOUNTER — Encounter: Payer: Self-pay | Admitting: *Deleted

## 2014-11-25 ENCOUNTER — Other Ambulatory Visit: Payer: Self-pay | Admitting: Physician Assistant

## 2014-11-27 ENCOUNTER — Ambulatory Visit (INDEPENDENT_AMBULATORY_CARE_PROVIDER_SITE_OTHER): Payer: 59 | Admitting: Physician Assistant

## 2014-11-27 VITALS — BP 130/82 | HR 56 | Temp 97.9°F | Resp 16 | Ht 61.0 in | Wt 221.0 lb

## 2014-11-27 DIAGNOSIS — Z8739 Personal history of other diseases of the musculoskeletal system and connective tissue: Secondary | ICD-10-CM

## 2014-11-27 DIAGNOSIS — F329 Major depressive disorder, single episode, unspecified: Secondary | ICD-10-CM | POA: Diagnosis not present

## 2014-11-27 DIAGNOSIS — F32A Depression, unspecified: Secondary | ICD-10-CM

## 2014-11-27 MED ORDER — DICLOFENAC-MISOPROSTOL 75-0.2 MG PO TBEC: 1.0000 | DELAYED_RELEASE_TABLET | Freq: Two times a day (BID) | ORAL | Status: DC

## 2014-11-27 MED ORDER — SERTRALINE HCL 50 MG PO TABS: 50.0000 mg | ORAL_TABLET | Freq: Every day | ORAL | Status: DC

## 2014-11-27 MED ORDER — HYDROCODONE-ACETAMINOPHEN 5-325 MG PO TABS: 1.0000 | ORAL_TABLET | Freq: Four times a day (QID) | ORAL | Status: DC | PRN

## 2014-11-27 NOTE — Patient Instructions (Signed)
Before taking an opioid pain pill please try taking 1000 mg of Tylenol first.  The maximum daily dosage of Tylenol is 4 grams daily.

## 2014-11-27 NOTE — Progress Notes (Signed)
11/27/2014 at 11:39 AM  Angela Cummings / DOB: 22-Jun-1955 / MRN: [id number]  The patient has Atrial fibrillation; Obesity; Atrial fibrillation with RVR; Arthritis; Asthma; Hypokalemia; Chest pain; Shortness of breath; and Situational depression on her problem list.  SUBJECTIVE  Chief complaint: Medication Refill and Depression  Patient here today for medication refills.  Reports she has been out of her pain medication for 2 days ago and would like this refilled today.  She has a long history of hip arthritis. She has been taking diclofenac-misoprostal bid with good relief of her pain.  She occasionally requires an opioid pain medication for particularly severe joint pain. She takes one tab once every other, and this has not been filled in over 6 months.   She has a history of depression that started last year when her husband passed.  She reports dysthymia, anhedonia, sleep disturbance, overeating, and difficulty with concentration. She denies psychomotor slowing and SI/HI. She has been given 25 mg sertraline for this problem in the past, but has only taken prn, and feels that this medication has never really helped her symptoms.    She  has a past medical history of Atrial fibrillation (11/2010); Bradycardia; DJD (degenerative joint disease); Obesity; Mild sleep apnea; Asthma; Anxiety; and Depression.    Medications reviewed and updated by myself where necessary, and exist elsewhere in the encounter.   Ms. Reynolds is allergic to adhesive. She  reports that she has never smoked. She has never used smokeless tobacco. She reports that she does not drink alcohol or use illicit drugs. She  reports that she does not engage in sexual activity. The patient  has past surgical history that includes Cholecystectomy; Cesarean section; Knee surgery; neck tumor resection; Hernia repair; TEE without cardioversion (03/13/2012); Cardioversion (03/13/2012); Cardioversion (N/A, 07/26/2012); TEE without cardioversion  (N/A, 09/02/2012); Ablation (08/2012); TEE without cardioversion (N/A, 11/13/2013); Ablation (11/13/2013); Cardioversion (N/A, 02/20/2014); atrial fibrillation ablation (N/A, 09/03/2012); and atrial fibrillation ablation (N/A, 11/13/2013).  Her family history includes Cancer in her daughter, father, and sister; Diabetes in her other; Emphysema in her brother and mother; Thyroid disease in her other.  Review of Systems  Constitutional: Negative for fever and chills.  Respiratory: Negative for cough.   Cardiovascular: Negative for chest pain.  Gastrointestinal: Negative for nausea.  Musculoskeletal: Positive for joint pain.  Skin: Negative for itching and rash.  Neurological: Negative for headaches.  Psychiatric/Behavioral: Positive for depression. Negative for suicidal ideas and substance abuse. The patient has insomnia.     OBJECTIVE  Her  height is 5\' 1"  (1.549 m) and weight is 221 lb (100.245 kg). Her oral temperature is 97.9 F (36.6 C). Her blood pressure is 130/82 and her pulse is 56. Her respiration is 16 and oxygen saturation is 98%.  The patient's body mass index is 41.78 kg/(m^2).  Physical Exam  Constitutional: She is oriented to person, place, and time. She appears well-developed. No distress.  Cardiovascular: Normal rate and intact distal pulses.   Rate irregularly irregular (not new)  Respiratory: Effort normal.  GI: Soft.  Musculoskeletal: Normal range of motion.  Neurological: She is alert and oriented to person, place, and time.  Skin: Skin is warm and dry. She is not diaphoretic.   NCCSRS checked and no suspicious activity noted.    No results found for this or any previous visit (from the past 24 hour(s)).  ASSESSMENT & PLAN  Angela Cummings was seen today for medication refill and depression. Patient given enough medication to last two months,  at which time she is to follow up for medication titration.    Diagnoses and all orders for this visit:  History of arthritis: Advised  patient add tylenol to her regimen via AVS.  Orders: -     Diclofenac-Misoprostol 75-0.2 MG TBEC; Take 1 tablet by mouth 2 (two) times daily. PATIENT NEEDS OFFICE VISIT FOR ADDITIONAL REFILLS -     HYDROcodone-acetaminophen (NORCO) 5-325 MG per tablet; Take 1 tablet by mouth every 6 (six) hours as needed for severe pain.  Depression (emotion) Orders: -     sertraline (ZOLOFT) 50 MG tablet; Take 1 tablet (50 mg total) by mouth daily. -     Ambulatory referral to Psychology    The patient was advised to call or come back to clinic if she does not see an improvement in symptoms, or worsens with the above plan.   Philis Fendt, MHS, PA-C [facility] 11/27/2014 11:39 AM

## 2015-02-16 ENCOUNTER — Encounter (HOSPITAL_COMMUNITY): Payer: Self-pay | Admitting: Nurse Practitioner

## 2015-02-16 ENCOUNTER — Ambulatory Visit (HOSPITAL_COMMUNITY)
Admission: RE | Admit: 2015-02-16 | Discharge: 2015-02-16 | Disposition: A | Discharge status: Home / Self Care | Payer: 59 | Source: Ambulatory Visit | Attending: Nurse Practitioner | Admitting: Nurse Practitioner

## 2015-02-16 VITALS — BP 122/82 | HR 117 | Ht 61.0 in | Wt 218.0 lb

## 2015-02-16 DIAGNOSIS — E669 Obesity, unspecified: Secondary | ICD-10-CM | POA: Insufficient documentation

## 2015-02-16 DIAGNOSIS — I480 Paroxysmal atrial fibrillation: Secondary | ICD-10-CM | POA: Diagnosis not present

## 2015-02-16 NOTE — Patient Instructions (Signed)
Parking code for [phone] [id number]

## 2015-02-16 NOTE — Progress Notes (Signed)
Patient ID: Angela Cummings, female   DOB: 1956/02/28, 59 y.o.   MRN: [id number]     Primary Care Physician: Kennon Portela, MD Referring Physician:Dr. Daron Offer Mcilvaine is a 59 y.o. female with a h/o PAF s/p  2 ablations with last ablation showing multiple atypical  flutter circuits that could not be ablated. She reports that she has frequent afib episodes. She is in afib today. She does not feel the palpitations but is more fatigued when in afib. She takes 30 mg diltiazem tabs when she has rapid rate. She is being compliant  with xarelto.  Today, she denies symptoms of palpitations, chest pain, shortness of breath, orthopnea, PND, lower extremity edema, dizziness, presyncope, syncope, or neurologic sequela. Positive for fatigue.The patient is tolerating medications without difficulties and is otherwise without complaint today.   Past Medical History  Diagnosis Date  . Atrial fibrillation 11/2010    paroxysmal  . Bradycardia   . DJD (degenerative joint disease)   . Obesity   . Mild sleep apnea   . Asthma   . Anxiety   . Depression    Past Surgical History  Procedure Laterality Date  . Cholecystectomy    . Cesarean section    . Knee surgery    . Neck tumor resection      benign  . Hernia repair    . Tee without cardioversion  03/13/2012    Procedure: TRANSESOPHAGEAL ECHOCARDIOGRAM (TEE);  Surgeon: [name], MD;  Location: [place];  Service: Cardiovascular;  Laterality: N/A;  Rm 2029  . Cardioversion  03/13/2012    Procedure: CARDIOVERSION;  Surgeon: [name], MD;  Location: [place];  Service: Cardiovascular;  Laterality: N/A;  . Cardioversion N/A 07/26/2012    Procedure: CARDIOVERSION;  Surgeon: Thompson Grayer, MD;  Location: [place];  Service: Cardiovascular;  Laterality: N/A;  . Tee without cardioversion N/A 09/02/2012    Procedure: TRANSESOPHAGEAL ECHOCARDIOGRAM (TEE);  Surgeon: Josue Hector, MD;  Location: [facility] ENDOSCOPY;  Service:  Cardiovascular;  Laterality: N/A;  . Ablation  08/2012    PVI by Dr [name]  . Tee without cardioversion N/A 11/13/2013    Procedure: TRANSESOPHAGEAL ECHOCARDIOGRAM (TEE);  Surgeon: [name], MD;  Location: [place];  Service: Cardiovascular;  Laterality: N/A;  . Ablation  11/13/2013    PVI by Dr [name]  . Cardioversion N/A 02/20/2014    Procedure: CARDIOVERSION;  Surgeon: Larey Dresser, MD;  Location: [facility] ENDOSCOPY;  Service: Cardiovascular;  Laterality: N/A;  . Atrial fibrillation ablation N/A 09/03/2012    Procedure: ATRIAL FIBRILLATION ABLATION;  Surgeon: Thompson Grayer, MD;  Location: [facility] CATH LAB;  Service: Cardiovascular;  Laterality: N/A;  . Atrial fibrillation ablation N/A 11/13/2013    Procedure: ATRIAL FIBRILLATION ABLATION;  Surgeon: Coralyn Mark, MD;  Location: [place] CATH LAB;  Service: Cardiovascular;  Laterality: N/A;    Current Outpatient Prescriptions  Medication Sig Dispense Refill  . albuterol (PROVENTIL HFA;VENTOLIN HFA) 108 (90 BASE) MCG/ACT inhaler Inhale 1 puff into the lungs every 6 (six) hours as needed for wheezing.     . Diclofenac-Misoprostol 75-0.2 MG TBEC Take 1 tablet by mouth 2 (two) times daily. PATIENT NEEDS OFFICE VISIT FOR ADDITIONAL REFILLS 60 tablet 1  . diltiazem (CARDIZEM) 30 MG tablet Take 1 tablet (30 mg total) by mouth daily as needed. 30 tablet 5  . diltiazem (CARTIA XT) 180 MG 24 hr capsule Take 1 capsule (180 mg total) by mouth daily. (Patient taking differently:  Take 180 mg by mouth 2 (two) times daily. ) 90 capsule 3  . pantoprazole (PROTONIX) 40 MG tablet Take 1 tablet (40 mg total) by mouth daily. 60 tablet 0  . propafenone (RYTHMOL) 225 MG tablet TAKE 1 TABLET BY MOUTH TWICE DAILY 180 tablet 3  . rivaroxaban (XARELTO) 20 MG TABS tablet TAKE 1 TABLET BY MOUTH DAILY 90 tablet 3  . sertraline (ZOLOFT) 50 MG tablet Take 1 tablet (50 mg total) by mouth daily. 30 tablet 1  . HYDROcodone-acetaminophen (NORCO) 5-325 MG per tablet Take 1 tablet by  mouth every 6 (six) hours as needed for severe pain. (Patient not taking: Reported on 02/16/2015) 30 tablet 0   Current Facility-Administered Medications  Medication Dose Route Frequency Provider Last Rate Last Dose  . 0.9 %  sodium chloride infusion  250 mL Intravenous Continuous Thompson Grayer, MD        Allergies  Allergen Reactions  . Adhesive [Tape] Itching and Rash    Social History   Social History  . Marital Status: Widowed    Spouse Name: N/A  . Number of Children: 3  . Years of Education: N/A   Occupational History  . Not on file.   Social History Main Topics  . Smoking status: Never Smoker   . Smokeless tobacco: Never Used  . Alcohol Use: No     Comment: Rarely  . Drug Use: No  . Sexual Activity: No   Other Topics Concern  . Not on file   Social History Narrative   Married with 3 children (17,30, 46) and granddaughter (65).   Lives in [place]. Unemployed.    Family History  Problem Relation Age of Onset  . Diabetes Other   . Thyroid disease Other   . Emphysema Mother   . Cancer Father   . Cancer Sister   . Emphysema Brother   . Cancer Daughter     ROS- All systems are reviewed and negative except as per the HPI above  Physical Exam: Filed Vitals:   02/16/15 0843  BP: 122/82  Pulse: 117  Height: 5\' 1"  (1.549 m)  Weight: 218 lb (98.884 kg)    GEN- The patient is well appearing, alert and oriented x 3 today.   Head- normocephalic, atraumatic Eyes-  Sclera clear, conjunctiva pink Ears- hearing intact Oropharynx- clear Neck- supple, no JVP Lymph- no cervical lymphadenopathy Lungs- Clear to ausculation bilaterally, normal work of breathing Heart- Regular rate and rhythm, no murmurs, rubs or gallops, PMI not laterally displaced GI- soft, NT, ND, + BS Extremities- no clubbing, cyanosis, or edema MS- no significant deformity or atrophy Skin- no rash or lesion Psych- euthymic mood, full affect Neuro- strength and sensation are intact  EKG-  Afib with rvr at 117 bpm. RBBB. LAFB QRS int 128 ms, QTc 451 ms Epic records reviewed.   Assessment and Plan: 1. PAF Pt reports her usual for some time to have breakthrough episodes of afib for which she uses 30 mg cqrdizem Continue Rythmol, long acting Cardizem Continue xarelto  2. Obesity Weight loss advised  F/u in 3 months, sooner if needed  Butch Penny C. Nilton Lave, [facility] [address] [place], [place] [address] [phone]

## 2015-03-13 ENCOUNTER — Other Ambulatory Visit: Payer: Self-pay | Admitting: Physician Assistant

## 2015-03-14 ENCOUNTER — Ambulatory Visit (INDEPENDENT_AMBULATORY_CARE_PROVIDER_SITE_OTHER): Payer: 59 | Admitting: Family Medicine

## 2015-03-14 DIAGNOSIS — M5030 Other cervical disc degeneration, unspecified cervical region: Secondary | ICD-10-CM | POA: Diagnosis not present

## 2015-03-14 DIAGNOSIS — Z1231 Encounter for screening mammogram for malignant neoplasm of breast: Secondary | ICD-10-CM | POA: Diagnosis not present

## 2015-03-14 DIAGNOSIS — Z13220 Encounter for screening for lipoid disorders: Secondary | ICD-10-CM

## 2015-03-14 DIAGNOSIS — I4891 Unspecified atrial fibrillation: Secondary | ICD-10-CM | POA: Diagnosis not present

## 2015-03-14 DIAGNOSIS — Z131 Encounter for screening for diabetes mellitus: Secondary | ICD-10-CM

## 2015-03-14 DIAGNOSIS — M150 Primary generalized (osteo)arthritis: Secondary | ICD-10-CM

## 2015-03-14 DIAGNOSIS — F32A Depression, unspecified: Secondary | ICD-10-CM

## 2015-03-14 DIAGNOSIS — M8949 Other hypertrophic osteoarthropathy, multiple sites: Secondary | ICD-10-CM

## 2015-03-14 DIAGNOSIS — Z8739 Personal history of other diseases of the musculoskeletal system and connective tissue: Secondary | ICD-10-CM

## 2015-03-14 DIAGNOSIS — J4520 Mild intermittent asthma, uncomplicated: Secondary | ICD-10-CM

## 2015-03-14 DIAGNOSIS — E669 Obesity, unspecified: Secondary | ICD-10-CM

## 2015-03-14 DIAGNOSIS — M797 Fibromyalgia: Secondary | ICD-10-CM | POA: Diagnosis not present

## 2015-03-14 DIAGNOSIS — F4321 Adjustment disorder with depressed mood: Secondary | ICD-10-CM

## 2015-03-14 DIAGNOSIS — N898 Other specified noninflammatory disorders of vagina: Secondary | ICD-10-CM

## 2015-03-14 DIAGNOSIS — F329 Major depressive disorder, single episode, unspecified: Secondary | ICD-10-CM

## 2015-03-14 DIAGNOSIS — Z23 Encounter for immunization: Secondary | ICD-10-CM

## 2015-03-14 DIAGNOSIS — L298 Other pruritus: Secondary | ICD-10-CM | POA: Diagnosis not present

## 2015-03-14 DIAGNOSIS — M159 Polyosteoarthritis, unspecified: Secondary | ICD-10-CM

## 2015-03-14 LAB — CBC WITH DIFFERENTIAL/PLATELET
Basophils Absolute: 0.1 10*3/uL (ref 0.0–0.1)
Basophils Relative: 1 % (ref 0–1)
Eosinophils Absolute: 0.1 10*3/uL (ref 0.0–0.7)
Eosinophils Relative: 2 % (ref 0–5)
HCT: 34.5 % — ABNORMAL LOW (ref 36.0–46.0)
HEMOGLOBIN: 11.1 g/dL — AB (ref 12.0–15.0)
LYMPHS ABS: 2.1 10*3/uL (ref 0.7–4.0)
Lymphocytes Relative: 32 % (ref 12–46)
MCH: 25.6 pg — AB (ref 26.0–34.0)
MCHC: 32.2 g/dL (ref 30.0–36.0)
MCV: 79.5 fL (ref 78.0–100.0)
MONOS PCT: 12 % (ref 3–12)
MPV: 10.3 fL (ref 8.6–12.4)
Monocytes Absolute: 0.8 10*3/uL (ref 0.1–1.0)
NEUTROS ABS: 3.6 10*3/uL (ref 1.7–7.7)
Neutrophils Relative %: 53 % (ref 43–77)
Platelets: 331 10*3/uL (ref 150–400)
RBC: 4.34 MIL/uL (ref 3.87–5.11)
RDW: 15.8 % — ABNORMAL HIGH (ref 11.5–15.5)
WBC: 6.7 10*3/uL (ref 4.0–10.5)

## 2015-03-14 LAB — COMPREHENSIVE METABOLIC PANEL
ALBUMIN: 4 g/dL (ref 3.6–5.1)
ALT: 15 U/L (ref 6–29)
AST: 17 U/L (ref 10–35)
Alkaline Phosphatase: 220 U/L — ABNORMAL HIGH (ref 33–130)
BUN: 10 mg/dL (ref 7–25)
CALCIUM: 9 mg/dL (ref 8.6–10.4)
CO2: 28 mmol/L (ref 20–31)
Chloride: 105 mmol/L (ref 98–110)
Creat: 0.78 mg/dL (ref 0.50–0.99)
Glucose, Bld: 95 mg/dL (ref 65–99)
POTASSIUM: 4.2 mmol/L (ref 3.5–5.3)
Sodium: 140 mmol/L (ref 135–146)
TOTAL PROTEIN: 6.7 g/dL (ref 6.1–8.1)
Total Bilirubin: 0.6 mg/dL (ref 0.2–1.2)

## 2015-03-14 LAB — POCT WET + KOH PREP
TRICH BY WET PREP: ABSENT
Yeast by KOH: ABSENT
Yeast by wet prep: ABSENT

## 2015-03-14 LAB — VITAMIN B12: Vitamin B-12: 447 pg/mL (ref 211–911)

## 2015-03-14 LAB — LIPID PANEL
CHOL/HDL RATIO: 2.4 ratio (ref <=5.0)
CHOLESTEROL: 169 mg/dL (ref 125–200)
HDL: 71 mg/dL (ref >=46)
LDL Cholesterol: 79 mg/dL (ref <130)
TRIGLYCERIDES: 94 mg/dL (ref <150)
VLDL: 19 mg/dL (ref <30)

## 2015-03-14 LAB — TSH: TSH: 2.395 u[IU]/mL (ref 0.350–4.500)

## 2015-03-14 MED ORDER — TERCONAZOLE 0.4 % VA CREA: 1.0000 | TOPICAL_CREAM | Freq: Every day | VAGINAL | Status: DC

## 2015-03-14 MED ORDER — SERTRALINE HCL 50 MG PO TABS: 50.0000 mg | ORAL_TABLET | Freq: Every day | ORAL | Status: DC

## 2015-03-14 MED ORDER — DICLOFENAC-MISOPROSTOL 75-0.2 MG PO TBEC: 1.0000 | DELAYED_RELEASE_TABLET | Freq: Two times a day (BID) | ORAL | Status: DC

## 2015-03-14 NOTE — Progress Notes (Addendum)
Subjective:  This chart was scribed for Angela Forts, MD by [facility], medical scribe at [facility].The patient was seen in exam room 04 and the patient's care was started at 9:30 AM.   Patient ID: Angela Cummings, female    DOB: Feb 07, 1956, 59 y.o.   MRN: [id number]  03/14/2015  Medication Refill and Immunizations  HPI HPI Comments: Angela Cummings is a 59 y.o. female who presents to [facility] for medication refill and immunizations.  Medication Refill: Last seen 11/27/2014. Needs her diclofenac refilled, she takes it twice daily. Arthritis throughout her body, shoulders, hips, neck and knees. States she has fibromyalgia. Followed by Rheumatology Angela Cummings, Angela Cummings is her Orthopedist, has not seen him since her shoulder surgery 6-7 years ago.   Albuterol for asthma, uses it as needed.   Increased Zoloft at last visit which was helpful. Taking this daily. Admits to major family stressors. Raising one year old great grandchild.  Two granddaughters currently pregnant with poor social situations.  Husband died in Sep 23, 2013 of melanoma with metastasis.  Denies SI/HI.  Taking Zoloft 50mg  daily now with improvement in mood.     Medical History: A-fib, diagnosed 4 years ago. Two ablations, three cardioversion.Taking Cardizem and Rythmol for this. Not UTD on physical, mammogram. Colonoscopy by Dr. Cristina Cummings  Immunizations: Wants a flu shot and tetanus today. No previous Zostavax.  Family history: Mother passed due to COPD, father died of lung cancer, he did have a heart attack at 58. Sister passed from COPD at 58. Second sister passed from lung cancer at 51. One brother passed at 80 from lung cancer also had a heart attack.   Social History Husband passed last year from melanoma. 3 biological children and adopted 1 child. 4 grandchildren and one great-great grandson. Lives with her son. Unemployed, not on disability. No alcohol, no formal  exercise.  No cough, SOB, leg swelling, nausea, vomiting, diarrhea, abdominal pain.  Reviewed [place] drug data base,Refilled one fill on hydrocodone on 11/27/2014 for 30 tablets.  Review of Systems  Constitutional: Negative for fever, chills, diaphoresis and fatigue.  HENT: Negative for ear pain, postnasal drip, rhinorrhea, sinus pressure, sore throat and trouble swallowing.   Eyes: Negative for visual disturbance.  Respiratory: Negative for cough and shortness of breath.   Cardiovascular: Negative for chest pain, palpitations and leg swelling.  Gastrointestinal: Negative for nausea, vomiting, abdominal pain, diarrhea and constipation.  Endocrine: Negative for cold intolerance, heat intolerance, polydipsia, polyphagia and polyuria.  Genitourinary: Negative for dysuria, vaginal bleeding, vaginal discharge and vaginal pain.  Musculoskeletal: Positive for arthralgias, gait problem and neck pain. Negative for joint swelling.  Neurological: Negative for dizziness, tremors, seizures, syncope, facial asymmetry, speech difficulty, weakness, light-headedness, numbness and headaches.  Psychiatric/Behavioral: Positive for dysphoric mood. Negative for suicidal ideas, sleep disturbance and self-injury. The patient is not nervous/anxious.     Past Medical History  Diagnosis Date  . Atrial fibrillation ([place]) 11/2010    paroxysmal  . Bradycardia   . Obesity   . Mild sleep apnea   . Asthma   . Anxiety   . Depression   . DJD (degenerative joint disease)     Cervical spine  . Osteoarthritis     Shoulders, hips,knees  . Fibromyalgia     s/p rheumatology consultation/Z[facility]insky   Past Surgical History  Procedure Laterality Date  . Cholecystectomy    . Cesarean section    . Knee surgery    .  Neck tumor resection      benign  . Hernia repair    . Tee without cardioversion  03/13/2012    Procedure: TRANSESOPHAGEAL ECHOCARDIOGRAM (TEE);  Surgeon: [name], MD;  Location: [place];  Service:  Cardiovascular;  Laterality: N/A;  Rm 2029  . Cardioversion  03/13/2012    Procedure: CARDIOVERSION;  Surgeon: [name], MD;  Location: [place];  Service: Cardiovascular;  Laterality: N/A;  . Cardioversion N/A 07/26/2012    Procedure: CARDIOVERSION;  Surgeon: [place] Grayer, MD;  Location: [place];  Service: Cardiovascular;  Laterality: N/A;  . Tee without cardioversion N/A 09/02/2012    Procedure: TRANSESOPHAGEAL ECHOCARDIOGRAM (TEE);  Surgeon: Josue Hector, MD;  Location: [facility] ENDOSCOPY;  Service: Cardiovascular;  Laterality: N/A;  . Ablation  08/2012    PVI by Dr Rayann Cummings  . Tee without cardioversion N/A 11/13/2013    Procedure: TRANSESOPHAGEAL ECHOCARDIOGRAM (TEE);  Surgeon: [name], MD;  Location: [place];  Service: Cardiovascular;  Laterality: N/A;  . Ablation  11/13/2013    PVI by Dr Rayann Cummings  . Cardioversion N/A 02/20/2014    Procedure: CARDIOVERSION;  Surgeon: Larey Dresser, MD;  Location: [facility] ENDOSCOPY;  Service: Cardiovascular;  Laterality: N/A;  . Atrial fibrillation ablation N/A 09/03/2012    Procedure: ATRIAL FIBRILLATION ABLATION;  Surgeon: [place] Grayer, MD;  Location: [facility] CATH LAB;  Service: Cardiovascular;  Laterality: N/A;  . Atrial fibrillation ablation N/A 11/13/2013    Procedure: ATRIAL FIBRILLATION ABLATION;  Surgeon: Coralyn Mark, MD;  Location: [place] CATH LAB;  Service: Cardiovascular;  Laterality: N/A;   Allergies  Allergen Reactions  . Adhesive [Tape] Itching and Rash   Social History   Social History  . Marital Status: Widowed    Spouse Name: N/A  . Number of Children: 3  . Years of Education: N/A   Occupational History  . Not on file.   Social History Main Topics  . Smoking status: Never Smoker   . Smokeless tobacco: Never Used  . Alcohol Use: No     Comment: Rarely  . Drug Use: No  . Sexual Activity: No   Other Topics Concern  . Not on file   Social History Narrative   Marital status: widowed since 01/2014 of melanoma; not dating.        Children:  3 children (17,30, 32) and 4 grandchildren and 1 gg. 1 adopted child.       Lives with son.  Lives in [place].        Employment:  Unemployed.       Tobacco: none      Alcohol: none      Drugs: none      Exercise: none   Family History  Problem Relation Age of Onset  . Diabetes Other   . Thyroid disease Other   . Emphysema Mother   . COPD Mother   . Anxiety disorder Mother   . Cancer Father     lung cancer; non-smoker  . Heart disease Father 22    AMI  . COPD Sister   . Emphysema Brother   . Cancer Daughter   . Cancer Sister     lung cancer  . Heart disease Sister     AMI       Objective:    There were no vitals taken for this visit. Physical Exam  Constitutional: She is oriented to person, place, and time. She appears well-developed and well-nourished. No distress.  obese  HENT:  Head:  Normocephalic and atraumatic.  Right Ear: External ear normal.  Left Ear: External ear normal.  Nose: Nose normal.  Mouth/Throat: Oropharynx is clear and moist.  Eyes: Conjunctivae and EOM are normal. Pupils are equal, round, and reactive to light.  Neck: Normal range of motion. Neck supple. Carotid bruit is not present. No thyromegaly present.  Cardiovascular: Normal rate, normal heart sounds and intact distal pulses.  An irregularly irregular rhythm present. Exam reveals no gallop and no friction rub.   No murmur heard. Pulmonary/Chest: Effort normal and breath sounds normal. She has no wheezes. She has no rales.  Abdominal: Soft. Bowel sounds are normal. She exhibits no distension and no mass. There is no tenderness. There is no rebound and no guarding.  Genitourinary: Uterus normal.    There is rash on the right labia. There is no tenderness or lesion on the right labia. There is rash on the left labia. There is no tenderness or lesion on the left labia. Cervix exhibits no motion tenderness, no discharge and no friability. Right adnexum displays no mass, no  tenderness and no fullness. Left adnexum displays no mass, no tenderness and no fullness. No erythema in the vagina. No foreign body around the vagina. Vaginal discharge found.  Lymphadenopathy:    She has no cervical adenopathy.  Neurological: She is alert and oriented to person, place, and time. No cranial nerve deficit.  Skin: Skin is warm and dry. No rash noted. She is not diaphoretic. No erythema. No pallor.  Psychiatric: She has a normal mood and affect. Her behavior is normal.   Results for orders placed or performed in visit on 03/14/15  POCT Wet + KOH Prep ([facility])  Result Value Ref Range   Yeast by KOH Absent Present, Absent   Yeast by wet prep Absent Present, Absent   WBC by wet prep Few None, Few   Clue Cells Wet Prep HPF POC None None   Trich by wet prep Absent Present, Absent   Bacteria Wet Prep HPF POC None None, Few   Epithelial Cells By [employer] Pref ([facility]) Few None, Few   RBC,UR,HPF,POC Few (A) None RBC/hpf   TDAP AND INFLUENZA VACCINES ADMINISTERED.    Assessment & Plan:   1. Situational depression   2. Obesity   3. Atrial fibrillation with RVR ([place])   4. Asthma, mild intermittent, uncomplicated   5. Degenerative disc disease, cervical   6. Primary osteoarthritis involving multiple joints   7. Fibromyalgia   8. Vaginal itching   9. Need for influenza vaccination   10. Need for Tdap vaccination   11. Encounter for screening mammogram for breast cancer   12. Screening for diabetes mellitus   13. Screening, lipid   14. History of arthritis   15. Depression (emotion)     1. Situational depression: improved with improved compliance with Zoloft; refill provided; RTC six months. 2.  Obesity: persistent; recommend weight loss, exercise, low-caloric food choices.   3.  Atrial fibrillation with RVR: stable with rate control on Cardizem regimen; continue Xarelto; followed by cardiology. 4.  Asthma mild intermittent: stable; continue Albuterol; s/p flu vaccine. 5.  DDD  cervical and OA multiple sites:  Stable; refill of Diclofenac provided; discussed risk of GI bleed with Diclofenac and Xarelto; pt taking Protonix daily for PUD prophylaxis and understands risk. 6.  Fibromyalgia: stable; refill of Zoloft provided; s/p rheumatology consultation in past. 7.  Candidiasis: New.  Rx for Terazol provided; use externally primarily. 8.  Screening DMII: obtain glucose  9. Screening lipid: obtain FLP. 10.  Encounter for breast cancer screening: refer for mammogram. 11.  S/p flu vaccine and TDAP.   Meds ordered this encounter  Medications  . terconazole (TERAZOL 7) 0.4 % vaginal cream    Sig: Place 1 applicator vaginally at bedtime.    Dispense:  45 g    Refill:  0  . Diclofenac-Misoprostol 75-0.2 MG TBEC    Sig: Take 1 tablet by mouth 2 (two) times daily.    Dispense:  60 tablet    Refill:  5  . sertraline (ZOLOFT) 50 MG tablet    Sig: Take 1 tablet (50 mg total) by mouth daily.    Dispense:  30 tablet    Refill:  5    Return in about 6 months (around 09/12/2015) for complete physical examiniation.  By signing my name below, I, Nadim Abuhashem, attest that this documentation has been prepared under the direction and in the presence of Angela Forts, MD.  Electronically Signed: Lora Havens, medical scribe. 03/14/2015, 10:55 AM.   I personally performed the services described in this documentation, which was scribed in my presence. The recorded information has been reviewed and considered.  Kadrian Partch Elayne Guerin, M.D. [facility] [address] [place], [place]  [address] [phone] phone [phone] fax

## 2015-03-15 ENCOUNTER — Other Ambulatory Visit: Payer: Self-pay

## 2015-03-15 DIAGNOSIS — Z1231 Encounter for screening mammogram for malignant neoplasm of breast: Secondary | ICD-10-CM

## 2015-03-15 LAB — VITAMIN D 25 HYDROXY (VIT D DEFICIENCY, FRACTURES): Vit D, 25-Hydroxy: 20 ng/mL — ABNORMAL LOW (ref 30–100)

## 2015-03-15 LAB — HEMOGLOBIN A1C
HEMOGLOBIN A1C: 6 % — AB (ref <5.7)
MEAN PLASMA GLUCOSE: 126 mg/dL — AB (ref <117)

## 2015-06-09 ENCOUNTER — Encounter (HOSPITAL_COMMUNITY): Payer: Self-pay | Admitting: Nurse Practitioner

## 2015-06-09 ENCOUNTER — Ambulatory Visit (HOSPITAL_COMMUNITY)
Admission: RE | Admit: 2015-06-09 | Discharge: 2015-06-09 | Disposition: A | Discharge status: Home / Self Care | Payer: 59 | Source: Ambulatory Visit | Attending: Nurse Practitioner | Admitting: Nurse Practitioner

## 2015-06-09 VITALS — BP 126/76 | HR 104 | Ht 61.0 in | Wt 220.2 lb

## 2015-06-09 DIAGNOSIS — I481 Persistent atrial fibrillation: Secondary | ICD-10-CM

## 2015-06-09 DIAGNOSIS — I4891 Unspecified atrial fibrillation: Secondary | ICD-10-CM

## 2015-06-09 DIAGNOSIS — I4819 Other persistent atrial fibrillation: Secondary | ICD-10-CM

## 2015-06-09 DIAGNOSIS — E669 Obesity, unspecified: Secondary | ICD-10-CM | POA: Insufficient documentation

## 2015-06-09 LAB — BASIC METABOLIC PANEL
Anion gap: 7 (ref 5–15)
BUN: 6 mg/dL (ref 6–20)
CO2: 27 mmol/L (ref 22–32)
Calcium: 8.5 mg/dL — ABNORMAL LOW (ref 8.9–10.3)
Chloride: 107 mmol/L (ref 101–111)
Creatinine, Ser: 0.74 mg/dL (ref 0.44–1.00)
GFR calc Af Amer: >60 mL/min (ref >60)
Glucose, Bld: 105 mg/dL — ABNORMAL HIGH (ref 65–99)
POTASSIUM: 3.8 mmol/L (ref 3.5–5.1)
SODIUM: 141 mmol/L (ref 135–145)

## 2015-06-09 LAB — MAGNESIUM: MAGNESIUM: 2 mg/dL (ref 1.7–2.4)

## 2015-06-09 NOTE — Progress Notes (Signed)
Patient ID: Angela Cummings, female   DOB: 29-Nov-1955, 59 y.o.   MRN: [id number]     Primary Care Physician: Kennon Portela, MD Referring Physician:Dr. Daron Offer [name] is a 59 y.o. female with a h/o PAF s/p  2 ablations with last ablation showing multiple atypical  flutter circuits that could not be ablated. She reports that she has frequent afib episodes. She is in afib today and feels that she has been in afib more so than SR for the last few months. She continues on rythmol. She has noted more fatigue. She does not feel the palpitations but is more fatigued when in afib. She takes 30 mg diltiazem tabs when she has rapid rate. On christmas day she reports that she had afib with RVR for which she takes an occasional 30 mg Cardizem. She is being compliant  with xarelto.  Today, she denies symptoms of palpitations, chest pain, shortness of breath, orthopnea, PND, lower extremity edema, dizziness, presyncope, syncope, or neurologic sequela. Positive for fatigue.The patient is tolerating medications without difficulties and is otherwise without complaint today.   Past Medical History  Diagnosis Date  . Atrial fibrillation ([place]) 11/2010    paroxysmal  . Bradycardia   . Obesity   . Mild sleep apnea   . Asthma   . Anxiety   . Depression   . DJD (degenerative joint disease)     Cervical spine  . Osteoarthritis     Shoulders, hips,knees  . Fibromyalgia     s/p rheumatology consultation/Z[facility]insky   Past Surgical History  Procedure Laterality Date  . Cholecystectomy    . Cesarean section    . Knee surgery    . Neck tumor resection      benign  . Hernia repair    . Tee without cardioversion  03/13/2012    Procedure: TRANSESOPHAGEAL ECHOCARDIOGRAM (TEE);  Surgeon: [name], MD;  Location: [place];  Service: Cardiovascular;  Laterality: N/A;  Rm 2029  . Cardioversion  03/13/2012    Procedure: CARDIOVERSION;  Surgeon: [name], MD;  Location: [place];   Service: Cardiovascular;  Laterality: N/A;  . Cardioversion N/A 07/26/2012    Procedure: CARDIOVERSION;  Surgeon: [place] Grayer, MD;  Location: [place];  Service: Cardiovascular;  Laterality: N/A;  . Tee without cardioversion N/A 09/02/2012    Procedure: TRANSESOPHAGEAL ECHOCARDIOGRAM (TEE);  Surgeon: Josue Hector, MD;  Location: [facility] ENDOSCOPY;  Service: Cardiovascular;  Laterality: N/A;  . Ablation  08/2012    PVI by Dr [name]  . Tee without cardioversion N/A 11/13/2013    Procedure: TRANSESOPHAGEAL ECHOCARDIOGRAM (TEE);  Surgeon: [name], MD;  Location: [place];  Service: Cardiovascular;  Laterality: N/A;  . Ablation  11/13/2013    PVI by Dr [name]  . Cardioversion N/A 02/20/2014    Procedure: CARDIOVERSION;  Surgeon: Larey Dresser, MD;  Location: [facility] ENDOSCOPY;  Service: Cardiovascular;  Laterality: N/A;  . Atrial fibrillation ablation N/A 09/03/2012    Procedure: ATRIAL FIBRILLATION ABLATION;  Surgeon: [place] Grayer, MD;  Location: [facility] CATH LAB;  Service: Cardiovascular;  Laterality: N/A;  . Atrial fibrillation ablation N/A 11/13/2013    Procedure: ATRIAL FIBRILLATION ABLATION;  Surgeon: Coralyn Mark, MD;  Location: [place] CATH LAB;  Service: Cardiovascular;  Laterality: N/A;    Current Outpatient Prescriptions  Medication Sig Dispense Refill  . albuterol (PROVENTIL HFA;VENTOLIN HFA) 108 (90 BASE) MCG/ACT inhaler Inhale 1 puff into the lungs every 6 (six) hours as needed for wheezing.     Marland Kitchen  Diclofenac-Misoprostol 75-0.2 MG TBEC Take 1 tablet by mouth 2 (two) times daily. 60 tablet 5  . diltiazem (CARDIZEM) 30 MG tablet Take 1 tablet (30 mg total) by mouth daily as needed. 30 tablet 5  . diltiazem (CARTIA XT) 180 MG 24 hr capsule Take 1 capsule (180 mg total) by mouth daily. (Patient taking differently: Take 180 mg by mouth 2 (two) times daily. ) 90 capsule 3  . pantoprazole (PROTONIX) 40 MG tablet Take 1 tablet (40 mg total) by mouth daily. 60 tablet 0  . propafenone (RYTHMOL) 225 MG  tablet TAKE 1 TABLET BY MOUTH TWICE DAILY 180 tablet 3  . rivaroxaban (XARELTO) 20 MG TABS tablet TAKE 1 TABLET BY MOUTH DAILY 90 tablet 3  . sertraline (ZOLOFT) 50 MG tablet Take 1 tablet (50 mg total) by mouth daily. 30 tablet 5   No current facility-administered medications for this encounter.    Allergies  Allergen Reactions  . Adhesive [Tape] Itching and Rash    Social History   Social History  . Marital Status: Widowed    Spouse Name: N/A  . Number of Children: 3  . Years of Education: N/A   Occupational History  . Not on file.   Social History Main Topics  . Smoking status: Never Smoker   . Smokeless tobacco: Never Used  . Alcohol Use: No     Comment: Rarely  . Drug Use: No  . Sexual Activity: No   Other Topics Concern  . Not on file   Social History Narrative   Marital status: widowed since 01/2014 of melanoma; not dating.       Children:  3 children (17,30, 32) and 4 grandchildren and 1 gg. 1 adopted child.       Lives with son.  Lives in [place].        Employment:  Unemployed.       Tobacco: none      Alcohol: none      Drugs: none      Exercise: none    Family History  Problem Relation Age of Onset  . Diabetes Other   . Thyroid disease Other   . Emphysema Mother   . COPD Mother   . Anxiety disorder Mother   . Cancer Father     lung cancer; non-smoker  . Heart disease Father 78    AMI  . COPD Sister   . Emphysema Brother   . Cancer Daughter   . Cancer Sister     lung cancer  . Heart disease Sister     AMI    ROS- All systems are reviewed and negative except as per the HPI above  Physical Exam: Filed Vitals:   06/09/15 0844  BP: 126/76  Pulse: 104  Height: 5\' 1"  (1.549 m)  Weight: 220 lb 3.2 oz (99.882 kg)    GEN- The patient is well appearing, alert and oriented x 3 today.   Head- normocephalic, atraumatic Eyes-  Sclera clear, conjunctiva pink Ears- hearing intact Oropharynx- clear Neck- supple, no JVP Lymph- no cervical  lymphadenopathy Lungs- Clear to ausculation bilaterally, normal work of breathing Heart-irregular rate and rhythm, no murmurs, rubs or gallops, PMI not laterally displaced GI- soft, NT, ND, + BS Extremities- no clubbing, cyanosis, or edema MS- no significant deformity or atrophy Skin- no rash or lesion Psych- euthymic mood, full affect Neuro- strength and sensation are intact  EKG- Afib with rvr at 104 bpm, IRBBB, qrs int 102 ms, qtc 441 ms epic  records reviewed  Assessment and Plan: 1. Persistent afib Appears to be failing Rythmol, more persistent afib over the last few months associated with fatigue Does not appear to be a repeat ablation candidate due to multiple aflutter circuits on last ablation, that were not abatable  Possibly Tikosyn candidate, will discuss with Dr. [name] Bmet/mag today   2. Obesity Weight loss advised  F/u will be per discussion with Dr. Lawrence Marseilles C. Carroll, [facility] [address] [place], [place] [address] [phone]

## 2015-06-16 ENCOUNTER — Telehealth (HOSPITAL_COMMUNITY): Payer: Self-pay | Admitting: *Deleted

## 2015-06-16 NOTE — Telephone Encounter (Signed)
Dr. [name] reviewed EKGs and felt Tikosyn would be reasonable option. Patient notified will send message to have this process started. Patient worried to come off Rythmol for washout but would wait for instruction from pharmacist regarding this.

## 2015-06-22 ENCOUNTER — Ambulatory Visit (INDEPENDENT_AMBULATORY_CARE_PROVIDER_SITE_OTHER): Payer: Commercial Managed Care - HMO | Admitting: Pharmacist

## 2015-06-22 ENCOUNTER — Encounter (HOSPITAL_COMMUNITY): Payer: Self-pay | Admitting: General Practice

## 2015-06-22 ENCOUNTER — Inpatient Hospital Stay (HOSPITAL_COMMUNITY)
Admission: AD | Admit: 2015-06-22 | Discharge: 2015-06-25 | DRG: 309 | Disposition: A | Discharge status: Home / Self Care | Payer: Commercial Managed Care - HMO | Source: Ambulatory Visit | Attending: Internal Medicine | Admitting: Internal Medicine

## 2015-06-22 DIAGNOSIS — F329 Major depressive disorder, single episode, unspecified: Secondary | ICD-10-CM | POA: Diagnosis present

## 2015-06-22 DIAGNOSIS — Z6841 Body Mass Index (BMI) 40.0 and over, adult: Secondary | ICD-10-CM

## 2015-06-22 DIAGNOSIS — J45909 Unspecified asthma, uncomplicated: Secondary | ICD-10-CM | POA: Diagnosis present

## 2015-06-22 DIAGNOSIS — I481 Persistent atrial fibrillation: Secondary | ICD-10-CM | POA: Diagnosis not present

## 2015-06-22 DIAGNOSIS — M797 Fibromyalgia: Secondary | ICD-10-CM | POA: Diagnosis present

## 2015-06-22 DIAGNOSIS — I4819 Other persistent atrial fibrillation: Secondary | ICD-10-CM

## 2015-06-22 DIAGNOSIS — E669 Obesity, unspecified: Secondary | ICD-10-CM | POA: Diagnosis present

## 2015-06-22 DIAGNOSIS — F419 Anxiety disorder, unspecified: Secondary | ICD-10-CM | POA: Diagnosis present

## 2015-06-22 DIAGNOSIS — M1990 Unspecified osteoarthritis, unspecified site: Secondary | ICD-10-CM | POA: Diagnosis present

## 2015-06-22 DIAGNOSIS — G4733 Obstructive sleep apnea (adult) (pediatric): Secondary | ICD-10-CM | POA: Diagnosis present

## 2015-06-22 DIAGNOSIS — Z79899 Other long term (current) drug therapy: Secondary | ICD-10-CM | POA: Diagnosis not present

## 2015-06-22 DIAGNOSIS — I4891 Unspecified atrial fibrillation: Secondary | ICD-10-CM | POA: Diagnosis present

## 2015-06-22 LAB — BASIC METABOLIC PANEL
Anion gap: 11 (ref 5–15)
BUN: 11 mg/dL (ref 6–20)
CHLORIDE: 107 mmol/L (ref 101–111)
CO2: 23 mmol/L (ref 22–32)
Calcium: 8.8 mg/dL — ABNORMAL LOW (ref 8.9–10.3)
Creatinine, Ser: 0.73 mg/dL (ref 0.44–1.00)
GFR calc Af Amer: >60 mL/min (ref >60)
GLUCOSE: 96 mg/dL (ref 65–99)
POTASSIUM: 3.8 mmol/L (ref 3.5–5.1)
Sodium: 141 mmol/L (ref 135–145)

## 2015-06-22 LAB — MAGNESIUM: Magnesium: 2 mg/dL (ref 1.7–2.4)

## 2015-06-22 MED ORDER — RIVAROXABAN 20 MG PO TABS
20.0000 mg | ORAL_TABLET | Freq: Every day | ORAL | Status: DC
  Administered 2015-06-22 – 2015-06-24 (×3): 20 mg via ORAL
  Filled 2015-06-22 (×3): qty 1

## 2015-06-22 MED ORDER — POTASSIUM CHLORIDE CRYS ER 20 MEQ PO TBCR
40.0000 meq | EXTENDED_RELEASE_TABLET | Freq: Once | ORAL | Status: AC
  Administered 2015-06-22: 40 meq via ORAL
  Filled 2015-06-22: qty 2

## 2015-06-22 MED ORDER — DOFETILIDE 250 MCG PO CAPS
500.0000 ug | ORAL_CAPSULE | Freq: Two times a day (BID) | ORAL | Status: DC
  Administered 2015-06-22 – 2015-06-23 (×2): 500 ug via ORAL
  Filled 2015-06-22 (×3): qty 2

## 2015-06-22 MED ORDER — PANTOPRAZOLE SODIUM 40 MG PO TBEC
40.0000 mg | DELAYED_RELEASE_TABLET | Freq: Every day | ORAL | Status: DC
  Administered 2015-06-22 – 2015-06-25 (×4): 40 mg via ORAL
  Filled 2015-06-22 (×4): qty 1

## 2015-06-22 MED ORDER — DICLOFENAC-MISOPROSTOL 75-0.2 MG PO TBEC: 1.0000 | DELAYED_RELEASE_TABLET | Freq: Two times a day (BID) | ORAL | Status: DC

## 2015-06-22 MED ORDER — DILTIAZEM HCL ER COATED BEADS 240 MG PO CP24
240.0000 mg | ORAL_CAPSULE | Freq: Every day | ORAL | Status: DC
  Administered 2015-06-22 – 2015-06-25 (×4): 240 mg via ORAL
  Filled 2015-06-22 (×4): qty 1

## 2015-06-22 MED ORDER — SODIUM CHLORIDE 0.9 % IJ SOLN: 3.0000 mL | INTRAMUSCULAR | Status: DC | PRN

## 2015-06-22 MED ORDER — SERTRALINE HCL 50 MG PO TABS: 50.0000 mg | ORAL_TABLET | Freq: Every day | ORAL | Status: DC

## 2015-06-22 MED ORDER — ALBUTEROL SULFATE (2.5 MG/3ML) 0.083% IN NEBU: 3.0000 mL | INHALATION_SOLUTION | Freq: Four times a day (QID) | RESPIRATORY_TRACT | Status: DC | PRN

## 2015-06-22 MED ORDER — SODIUM CHLORIDE 0.9 % IJ SOLN
3.0000 mL | Freq: Two times a day (BID) | INTRAMUSCULAR | Status: DC
  Administered 2015-06-22 – 2015-06-25 (×7): 3 mL via INTRAVENOUS

## 2015-06-22 MED ORDER — PNEUMOCOCCAL VAC POLYVALENT 25 MCG/0.5ML IJ INJ
0.5000 mL | INJECTION | INTRAMUSCULAR | Status: AC
  Administered 2015-06-25: 0.5 mL via INTRAMUSCULAR
  Filled 2015-06-22: qty 0.5

## 2015-06-22 MED ORDER — SODIUM CHLORIDE 0.9 % IV SOLN: 250.0000 mL | INTRAVENOUS | Status: DC | PRN

## 2015-06-22 NOTE — Progress Notes (Signed)
Pharmacy Review for Dofetilide (Tikosyn) Initiation  Admit Complaint: 60 y.o. female admitted 06/22/2015 with atrial fibrillation to be initiated on dofetilide.   Assessment:  Patient Exclusion Criteria: If any screening criteria checked as "Yes", then  patient  should NOT receive dofetilide until criteria item is corrected. If "Yes" please indicate correction plan.  YES  NO Patient  Exclusion Criteria Correction Plan  []  [x]  Baseline QTc interval is greater than or equal to 440 msec. IF above YES box checked dofetilide contraindicated unless patient has ICD; then may proceed if QTc 500-550 msec or with known ventricular conduction abnormalities may proceed with QTc 550-600 msec. QTc = 499(prior QTc was 441 on 12/28). MD is ware -Proceed with Tikosyn per progress notes  []  [x]  Magnesium level is less than 1.8 mEq/l : Last magnesium:  Lab Results  Component Value Date   MG 2.0 06/22/2015         [x]  []  Potassium level is less than 4 mEq/l : Last potassium:  Lab Results  Component Value Date   K 3.8 06/22/2015       Potassium replacement ordered  []  [x]  Patient is known or suspected to have a digoxin level greater than 2 ng/ml: No results found for: DIGOXIN    []  [x]  Creatinine clearance less than 20 ml/min (calculated using Cockcroft-Gault, actual body weight and serum creatinine): Estimated Creatinine Clearance: 78.8 mL/min (by C-G formula based on Cr of 0.73).    []  [x]  Patient has received drugs known to prolong the QT intervals within the last 48 hours (phenothiazines, tricyclics or tetracyclic antidepressants, erythromycin, H-1 antihistamines, cisapride, fluoroquinolones, azithromycin). Drugs not listed above may have an, as yet, undetected potential to prolong the QT interval, updated information on QT prolonging agents is available at this website:QT prolonging agents   []  [x]  Patient received a dose of hydrochlorothiazide (Oretic) alone or in any combination including  triamterene (Dyazide, Maxzide) in the last 48 hours.   []  [x]  Patient received a medication known to increase dofetilide plasma concentrations prior to initial dofetilide dose:  . Trimethoprim (Primsol, Proloprim) in the last 36 hours . Verapamil (Calan, Verelan) in the last 36 hours or a sustained release dose in the last 72 hours . Megestrol (Megace) in the last 5 days  . Cimetidine (Tagamet) in the last 6 hours . Ketoconazole (Nizoral) in the last 24 hours . Itraconazole (Sporanox) in the last 48 hours  . Prochlorperazine (Compazine) in the last 36 hours    []  [x]  Patient is known to have a history of torsades de pointes; congenital or acquired long QT syndromes.   []  [x]  Patient has received a Class 1 antiarrhythmic with less than 2 half-lives since last dose. (Disopyramide, Quinidine, Procainamide, Lidocaine, Mexiletine, Flecainide, Propafenone)   []  [x]  Patient has received amiodarone therapy in the past 3 months or amiodarone level is greater than 0.3 ng/ml.    Patient has been appropriately anticoagulated with Xarelto.  Ordering provider was confirmed at [email or web address] if they are not listed on the [place] Prescribers list.  Goal of Therapy: Follow renal function, electrolytes, potential drug interactions, and dose adjustment. Provide education and 1 week supply at discharge.  Plan:  [x]   Physician selected initial dose within range recommended for patients level of renal function - will monitor for response.  []   Physician selected initial dose outside of range recommended for patients level of renal function - will discuss if the dose should be altered at this time.   [employer] One  Calculated CrCl  Dose q12h  [x]  > 60 ml/min 500 mcg  []  40-60 ml/min 250 mcg  []  20-40 ml/min 125 mcg   2. Follow up QTc after the first 5 doses, renal function, electrolytes (K & Mg) daily x 3     days, dose adjustment, success of initiation and facilitate 1 week discharge supply as      clinically indicated.  3. Initiate Tikosyn education video (Call [phone] and ask for video # [phone]).  4. Place Enrollment Form on the chart for discharge supply of dofetilide.  Hildred Laser, Pharm D 06/22/2015 3:15 PM

## 2015-06-22 NOTE — H&P (Signed)
ELECTROPHYSIOLOGY HISTORY AND PHYSICAL     Patient ID: Angela Cummings MRN: [id number], DOB/AGE: May 29, 1956 60 y.o.  Admit date: 06/22/2015 Date of Consult: 06/22/2015  Primary Physician: Kennon Portela, MD Primary Cardiologist: Einar Gip Electrophysiologist: Betti Goodenow  CC: here for Tikosyn load  HPI:  Angela Cummings is a 60 y.o. female with a past medical history significant for persistent atrial fibrillation s/p PVI with multiple flutter circuits identified at that time, obesity, and depression.  She underwent PVI in 08/2013 and 11/2013 at which time multiple flutter circuits were also identified. She was maintained on Rhythmol and did well initially but has since developed recurrent symptomatic atrial fibrillation. She was seen in the AF clinic and tikosyn was discussed as treatment option. She has had appropriate washout of Rhythmol and presents today for Tikosyn admission.   Echo 11/2013 demonstrated EF 50-55%  She currently reports increasing tachy palpitations and shortness of breath over the past few days off of Rhythmol. She has not had recent fevers, chills, nausea, vomiting, dizziness, syncope.   Past Medical History  Diagnosis Date  . Atrial fibrillation ([place]) 11/2010    paroxysmal  . Bradycardia   . Obesity   . Mild sleep apnea   . Asthma   . Anxiety   . Depression   . DJD (degenerative joint disease)     Cervical spine  . Osteoarthritis     Shoulders, hips,knees  . Fibromyalgia     s/p rheumatology consultation/Zeminsky     Surgical History:  Past Surgical History  Procedure Laterality Date  . Cholecystectomy    . Cesarean section    . Knee surgery    . Neck tumor resection      benign  . Hernia repair    . Tee without cardioversion  03/13/2012    Procedure: TRANSESOPHAGEAL ECHOCARDIOGRAM (TEE);  Surgeon: [name], MD;  Location: [place];  Service: Cardiovascular;  Laterality: N/A;  Rm 2029  . Cardioversion  03/13/2012    Procedure:  CARDIOVERSION;  Surgeon: [name], MD;  Location: [place];  Service: Cardiovascular;  Laterality: N/A;  . Cardioversion N/A 07/26/2012    Procedure: CARDIOVERSION;  Surgeon: Thompson Grayer, MD;  Location: [place];  Service: Cardiovascular;  Laterality: N/A;  . Tee without cardioversion N/A 09/02/2012    Procedure: TRANSESOPHAGEAL ECHOCARDIOGRAM (TEE);  Surgeon: Josue Hector, MD;  Location: [facility] ENDOSCOPY;  Service: Cardiovascular;  Laterality: N/A;  . Ablation  08/2012    PVI by Dr [name]  . Tee without cardioversion N/A 11/13/2013    Procedure: TRANSESOPHAGEAL ECHOCARDIOGRAM (TEE);  Surgeon: [name], MD;  Location: [place];  Service: Cardiovascular;  Laterality: N/A;  . Ablation  11/13/2013    PVI by Dr [name]  . Cardioversion N/A 02/20/2014    Procedure: CARDIOVERSION;  Surgeon: Larey Dresser, MD;  Location: [facility] ENDOSCOPY;  Service: Cardiovascular;  Laterality: N/A;  . Atrial fibrillation ablation N/A 09/03/2012    Procedure: ATRIAL FIBRILLATION ABLATION;  Surgeon: Thompson Grayer, MD;  Location: [facility] CATH LAB;  Service: Cardiovascular;  Laterality: N/A;  . Atrial fibrillation ablation N/A 11/13/2013    Procedure: ATRIAL FIBRILLATION ABLATION;  Surgeon: Coralyn Mark, MD;  Location: [place] CATH LAB;  Service: Cardiovascular;  Laterality: N/A;     Prescriptions prior to admission  Medication Sig Dispense Refill Last Dose  . albuterol (PROVENTIL HFA;VENTOLIN HFA) 108 (90 BASE) MCG/ACT inhaler Inhale 1 puff into the lungs every 6 (six) hours as needed for wheezing.  Taking  . Diclofenac-Misoprostol 75-0.2 MG TBEC Take 1 tablet by mouth 2 (two) times daily. 60 tablet 5 Taking  . diltiazem (CARDIZEM) 30 MG tablet Take 1 tablet (30 mg total) by mouth daily as needed. 30 tablet 5 Taking  . diltiazem (CARTIA XT) 180 MG 24 hr capsule Take 1 capsule (180 mg total) by mouth daily. (Patient taking differently: Take 180 mg by mouth 2 (two) times daily. ) 90 capsule 3 Taking  . pantoprazole  (PROTONIX) 40 MG tablet Take 1 tablet (40 mg total) by mouth daily. 60 tablet 0 Taking  . propafenone (RYTHMOL) 225 MG tablet TAKE 1 TABLET BY MOUTH TWICE DAILY 180 tablet 3 Taking  . rivaroxaban (XARELTO) 20 MG TABS tablet TAKE 1 TABLET BY MOUTH DAILY 90 tablet 3 Taking  . sertraline (ZOLOFT) 50 MG tablet Take 1 tablet (50 mg total) by mouth daily. 30 tablet 5 Taking    Allergies:  Allergies  Allergen Reactions  . Adhesive [Tape] Itching and Rash    Social History   Social History  . Marital Status: Widowed    Spouse Name: N/A  . Number of Children: 3  . Years of Education: N/A   Occupational History  . Not on file.   Social History Main Topics  . Smoking status: Never Smoker   . Smokeless tobacco: Never Used  . Alcohol Use: No     Comment: Rarely  . Drug Use: No  . Sexual Activity: No   Other Topics Concern  . Not on file   Social History Narrative   Marital status: widowed since 01/2014 of melanoma; not dating.       Children:  3 children (17,30, 32) and 4 grandchildren and 1 gg. 1 adopted child.       Lives with son.  Lives in [place].        Employment:  Unemployed.       Tobacco: none      Alcohol: none      Drugs: none      Exercise: none     Family History  Problem Relation Age of Onset  . Diabetes Other   . Thyroid disease Other   . Emphysema Mother   . COPD Mother   . Anxiety disorder Mother   . Cancer Father     lung cancer; non-smoker  . Heart disease Father 78    AMI  . COPD Sister   . Emphysema Brother   . Cancer Daughter   . Cancer Sister     lung cancer  . Heart disease Sister     AMI     Review of Systems: All other systems reviewed and are otherwise negative except as noted above.  Physical Exam: Filed Vitals:   06/22/15 1413  BP: 121/84  Pulse: 145  Temp: 97.9 F (36.6 C)  TempSrc: Oral  Resp: 18  Weight: 214 lb 6.4 oz (97.251 kg)  SpO2: 99%    GEN- The patient is obese appearing, alert and oriented x 3 today.     HEENT: normocephalic, atraumatic; sclera clear, conjunctiva pink; hearing intact; oropharynx clear; neck supple Lungs- Clear to ausculation bilaterally, normal work of breathing.  No wheezes, rales, rhonchi Heart- Tachycardic irregular rate and rhythm  GI- soft, non-tender, non-distended, bowel sounds present  Extremities- no clubbing, cyanosis, or edema; DP/PT/radial pulses 2+ bilaterally MS- no significant deformity or atrophy Skin- warm and dry, no rash or lesion Psych- euthymic mood, full affect Neuro- strength and sensation are intact  Labs:   Lab Results  Component Value Date   WBC 6.7 03/14/2015   HGB 11.1* 03/14/2015   HCT 34.5* 03/14/2015   MCV 79.5 03/14/2015   PLT 331 03/14/2015     Recent Labs Lab 06/22/15 1151  NA 141  K 3.8  CL 107  CO2 23  BUN 11  CREATININE 0.73  CALCIUM 8.8*  GLUCOSE 96    EKG: atrial fibrillation, rate 138, QTc has been consistently 420-446msec in AF  Assessment/Plan: 1.  Persistent atrial fibrillation The patient has persistent symptomatic atrial fibrillation and has previously undergone 2 ablations. She had multiple flutter circuits identified at last ablation and medical therapy was recommended. Start Tikosyn 544mcg twice daily today Will need to follow QTc closely - she has consistently been 420-439msec as an outpatient Keep K >3.9, Mg >1.8 (will replete K today) - ok to give Tikosyn tonight, no need to repeat K Continue Xarelto for [place] of 1 - pt reports compliance for the last 4 weeks If still in AF Thursday, will need DCCV Will increase Cardizem dose for now with tachycardia  2.  Obesity Body mass index is 40.53 kg/(m^2). Weight loss encouarged   Signed, Chanetta Marshall, NP 06/22/2015 2:45 PM   I have seen, examined the patient, and reviewed the above assessment and plan.  On exam, tachy irregular rhythm. Changes to above are made where necessary.  Agree with above.   Will start tikosyn 500 mcg BID.  Given very  fast ventricular rates in afib, QT is probably not very accurate.  I would advise that we go ahead and start tikosyn and reassess QT once in sinus.  No need for an additional EKG today.   Labs reviewed and do not need to be repeated today.  Co Sign: Thompson Grayer, MD 06/22/2015 3:14 PM

## 2015-06-22 NOTE — Progress Notes (Signed)
Patient ID: Angela Cummings DOB: Oct 07, 1955, 60 yo MRN: [id number]     HPI: Angela Cummings is a 60 yo female patient of Dr. [name] who presents for Tikosyn initiation. PMH is significant for PAF s/p 2 ablations with last ablation showing multiple atypical flutter circuits that could not be ablated. She reports that she has had frequent afib episodes despite therapy with propafenone and diltiazem. Roderic Palau in afib clinic discussed with Dr. [name] and the decision was made to initiate Tikosyn.  Patient presents today to clinic alone. We reviewed potential side effects of Tikosyn including QTc prolongation.She is aware of the importance of not missing doses and will call the office if she misses more than 2 doses in a row. Reviewed medication list - patient reports that she rarely takes her sertraline anymore (maybe once a month). She stopped taking her propafenone on Friday and has had an appropriate 3 day washout before Tikosyn initiation. Patient does not take any other QTc prolonging medications. She is aware to inform all of her other providers about Tikosyn and to call the office if there is ever a question about taking a new medication. She is anticoagulated on the appropriate dose of Xarelto 20mg  daily and reports that she has not missed a dose in the last month.  EKG reviewed by Dr. Lovena Le. Atrial fibrillation with RVR. Vent rate 138bpm, QTc 49msec however pt in afib so difficult to determine true QTc. EKG done 1 week ago and QTc was 415msec at that time. Per Dr. Lovena Le, pt still ok for Tikosyn admit.  Labs: K 3.8, Mg 2, SCr 0.73, Wt 220 lbs, CrCl >121mL/min  Past Medical History  Diagnosis Date  . Atrial fibrillation ([place]) 11/2010    paroxysmal  . Bradycardia   . Obesity   . Mild sleep apnea   . Asthma   . Anxiety   . Depression   . DJD (degenerative joint disease)     Cervical spine  . Osteoarthritis     Shoulders, hips,knees  .  Fibromyalgia     s/p rheumatology consultation/Zeminsky   Current Outpatient Prescriptions on File Prior to Visit  Medication Sig Dispense Refill  . albuterol (PROVENTIL HFA;VENTOLIN HFA) 108 (90 BASE) MCG/ACT inhaler Inhale 1 puff into the lungs every 6 (six) hours as needed for wheezing.     . Diclofenac-Misoprostol 75-0.2 MG TBEC Take 1 tablet by mouth 2 (two) times daily. 60 tablet 5  . diltiazem (CARDIZEM) 30 MG tablet Take 1 tablet (30 mg total) by mouth daily as needed. 30 tablet 5  . diltiazem (CARTIA XT) 180 MG 24 hr capsule Take 1 capsule (180 mg total) by mouth daily. (Patient taking differently: Take 180 mg by mouth 2 (two) times daily. ) 90 capsule 3  . pantoprazole (PROTONIX) 40 MG tablet Take 1 tablet (40 mg total) by mouth daily. 60 tablet 0  . propafenone (RYTHMOL) 225 MG tablet TAKE 1 TABLET BY MOUTH TWICE DAILY 180 tablet 3  . rivaroxaban (XARELTO) 20 MG TABS tablet TAKE 1 TABLET BY MOUTH DAILY 90 tablet 3  . sertraline (ZOLOFT) 50 MG tablet Take 1 tablet (50 mg total) by mouth daily. 30 tablet 5   No current facility-administered medications on file prior to visit.   Allergies  Allergen Reactions  . Adhesive [Tape] Itching and Rash    Assessment/Plan:  1. Atrial fibrillation: EKG reviewed by Dr. Lovena Le and ok for admit. Mg good at 2, K a little low at 3.8, will need inpatient  supplementation before Tikosyn start. Pt aware to report to admitting.     Megan E. Supple, PharmD [facility] [id number] [address] [place], [place] [address] Phone: [phone]; Fax: [phone] 06/22/2015 12:30 PM

## 2015-06-22 NOTE — Progress Notes (Signed)
UR Completed. Mariah Harn, RN, BSN.  [phone] 

## 2015-06-23 LAB — BASIC METABOLIC PANEL
Anion gap: 9 (ref 5–15)
BUN: 13 mg/dL (ref 6–20)
CO2: 24 mmol/L (ref 22–32)
Calcium: 8.5 mg/dL — ABNORMAL LOW (ref 8.9–10.3)
Chloride: 107 mmol/L (ref 101–111)
Creatinine, Ser: 0.7 mg/dL (ref 0.44–1.00)
GFR calc Af Amer: >60 mL/min (ref >60)
GFR calc non Af Amer: >60 mL/min (ref >60)
Glucose, Bld: 96 mg/dL (ref 65–99)
Potassium: 3.9 mmol/L (ref 3.5–5.1)
Sodium: 140 mmol/L (ref 135–145)

## 2015-06-23 LAB — MAGNESIUM: MAGNESIUM: 2.1 mg/dL (ref 1.7–2.4)

## 2015-06-23 MED ORDER — DICLOFENAC SODIUM 75 MG PO TBEC
75.0000 mg | DELAYED_RELEASE_TABLET | Freq: Two times a day (BID) | ORAL | Status: DC
  Administered 2015-06-23 – 2015-06-25 (×5): 75 mg via ORAL
  Filled 2015-06-23 (×7): qty 1

## 2015-06-23 MED ORDER — MISOPROSTOL 200 MCG PO TABS
200.0000 ug | ORAL_TABLET | Freq: Two times a day (BID) | ORAL | Status: DC
Start: 2015-06-23 — End: 2015-06-25
  Administered 2015-06-23 – 2015-06-25 (×5): 200 ug via ORAL
  Filled 2015-06-23 (×7): qty 1

## 2015-06-23 MED ORDER — DOFETILIDE 250 MCG PO CAPS
250.0000 ug | ORAL_CAPSULE | Freq: Two times a day (BID) | ORAL | Status: DC
  Administered 2015-06-23 – 2015-06-25 (×4): 250 ug via ORAL
  Filled 2015-06-23 (×3): qty 1

## 2015-06-23 MED ORDER — POTASSIUM CHLORIDE CRYS ER 20 MEQ PO TBCR
20.0000 meq | EXTENDED_RELEASE_TABLET | Freq: Every day | ORAL | Status: DC
  Administered 2015-06-23 – 2015-06-25 (×3): 20 meq via ORAL
  Filled 2015-06-23 (×3): qty 1

## 2015-06-23 NOTE — Progress Notes (Addendum)
   SUBJECTIVE: The patient is doing well today.  At this time, she denies chest pain, shortness of breath, or any new concerns.  . diclofenac  75 mg Oral BID WC   And  . misoprostol  200 mcg Oral BID WC  . diltiazem  240 mg Oral Daily  . dofetilide  500 mcg Oral BID  . pantoprazole  40 mg Oral Daily  . pneumococcal 23 valent vaccine  0.5 mL Intramuscular Tomorrow-1000  . potassium chloride  20 mEq Oral Daily  . rivaroxaban  20 mg Oral Q supper  . sodium chloride  3 mL Intravenous Q12H      OBJECTIVE: Physical Exam: Filed Vitals:   06/22/15 1413 06/22/15 1654 06/22/15 2134 06/23/15 0512  BP: 121/84 114/92 101/69 106/68  Pulse: 145  57 110  Temp: 97.9 F (36.6 C)  97.7 F (36.5 C) 98.1 F (36.7 C)  TempSrc: Oral  Oral Oral  Resp: 18  18   Height: 5\' 1"  (1.549 m)     Weight: 214 lb 6.4 oz (97.251 kg)     SpO2: 99%  100% 100%    Intake/Output Summary (Last 24 hours) at 06/23/15 0751 Last data filed at 06/22/15 2330  Gross per 24 hour  Intake    480 ml  Output      0 ml  Net    480 ml    Telemetry reveals afib with elevated V rates  GEN- The patient is well appearing, alert and oriented x 3 today.   Head- normocephalic, atraumatic Eyes-  Sclera clear, conjunctiva pink Ears- hearing intact Oropharynx- clear Neck- supple,   Lungs- Clear to ausculation bilaterally, normal work of breathing Heart- tachycardic irregular rhythm  GI- soft, NT, ND, + BS Extremities- no clubbing, cyanosis, or edema Skin- no rash or lesion Psych- euthymic mood, full affect Neuro- strength and sensation are intact  LABS: Basic Metabolic Panel:  Recent Labs  06/22/15 1151 06/23/15 0234  NA 141 140  K 3.8 3.9  CL 107 107  CO2 23 24  GLUCOSE 96 96  BUN 11 13  CREATININE 0.73 0.70  CALCIUM 8.8* 8.5*  MG 2.0 2.1   ekg from overnight reveals afib with RBBB.  Qt difficult to determine in setting of RVR but appears at baseline.  ASSESSMENT AND PLAN:   Assessment/Plan: 1.  Persistent atrial fibrillation The patient has persistent symptomatic atrial fibrillation and has previously undergone 2 ablations. She had multiple flutter circuits identified at last ablation and medical therapy was recommended. Very complex and difficult to manage. Started Tikosyn 570mcg twice daily yesterday Will need to follow QTc closely - she has consistently been 420-429msec as an outpatient I believe that her QT is stable, though it is very hard to determine in setting of RVR Continue tikosyn Will plan cardioversion tomorrow if still in AF at that time Continue xarelto   2. Obesity Body mass index is 40.53 kg/(m^2). Weight loss encouarged  Thompson Grayer, MD 06/23/2015 7:51 AM

## 2015-06-23 NOTE — Care Management Note (Addendum)
Case Management Note  Patient Details  Name: SAYYORA KORVER MRN: [id number] Date of Birth: 03/18/1956  Subjective/Objective:    Pt has persistent A Fib and was admitted for [facility] load           Action/Plan:   Pt is independent from home.  CM will help pt secure Tikosyn post discharge   Expected Discharge Date:                  Expected Discharge Plan:  Home/Self Care  In-House Referral:     Discharge planning Services  CM Consult, Medication Assistance  Post Acute Care Choice:    Choice offered to:     DME Arranged:    DME Agency:     HH Arranged:    HH Agency:     Status of Service:  In process, will continue to follow  Medicare Important Message Given:    Date Medicare IM Given:    Medicare IM give by:    Date Additional Medicare IM Given:    Additional Medicare Important Message give by:     If discussed at [place] of Stay Meetings, dates discussed:    Additional Comments: 06/22/15 CM submitted benefit check S/W TAMICKA @ [employer] # [phone]   TIKOSYN 250 MCG  BID   AND   500 MCG BID  COVER- YES                   YES  CO-PAY- 4 50.00                 $ 50.00  TIER- 3 DRUG                   3 DRUG  PRIOR APPROVAL - NO          NO   PHARMACY : [facility] # [phone]  [place], [place]  [facility]       Previous Messages      Pt informed of copay, denied hardship with paying copay amount.  CM contacted preferred pharmacy [facility] on [address] in [place]; pharmacy can get medication next day delivery.  Pt informed that she will discharge home with 7 day Tikosyn supply, CM asked pt to take prescription to pharmacy no later than day post discharge. Maryclare Labrador, RN 06/23/2015, 9:44 AM

## 2015-06-23 NOTE — Progress Notes (Signed)
Attempted to schedule DCCV for tomorrow, there are no openings available right now.  Will need to call in the morning to schedule.  Chanetta Marshall, NP 06/23/2015 9:29 AM

## 2015-06-24 LAB — MAGNESIUM: MAGNESIUM: 2 mg/dL (ref 1.7–2.4)

## 2015-06-24 LAB — BASIC METABOLIC PANEL
Anion gap: 7 (ref 5–15)
BUN: 15 mg/dL (ref 6–20)
CALCIUM: 8.5 mg/dL — AB (ref 8.9–10.3)
CO2: 24 mmol/L (ref 22–32)
CREATININE: 0.85 mg/dL (ref 0.44–1.00)
Chloride: 108 mmol/L (ref 101–111)
GFR calc Af Amer: >60 mL/min (ref >60)
GFR calc non Af Amer: >60 mL/min (ref >60)
Glucose, Bld: 98 mg/dL (ref 65–99)
Potassium: 3.5 mmol/L (ref 3.5–5.1)
Sodium: 139 mmol/L (ref 135–145)

## 2015-06-24 MED ORDER — POTASSIUM CHLORIDE CRYS ER 20 MEQ PO TBCR
20.0000 meq | EXTENDED_RELEASE_TABLET | Freq: Once | ORAL | Status: AC
  Administered 2015-06-24: 20 meq via ORAL
  Filled 2015-06-24: qty 1

## 2015-06-24 NOTE — Progress Notes (Signed)
Patient QTc on the monitor was 515 to 533. Dr. Philbert Riser was notified and a lower dose of Tikosyn was ordered. 12lead EKG done. Pt. given 215mcg of Tikosyn at 2246. Pt. informed and agreeable. Will continue to monitor closely.

## 2015-06-24 NOTE — Progress Notes (Signed)
   SUBJECTIVE: The patient is doing well today.  At this time, she denies chest pain, shortness of breath, or any new concerns.  She is surprised to hear she was in normal rhythm, can not at least so far at rest tell any difference.  . diclofenac  75 mg Oral BID WC   And  . misoprostol  200 mcg Oral BID WC  . diltiazem  240 mg Oral Daily  . dofetilide  250 mcg Oral BID  . pantoprazole  40 mg Oral Daily  . pneumococcal 23 valent vaccine  0.5 mL Intramuscular Tomorrow-1000  . potassium chloride  20 mEq Oral Daily  . rivaroxaban  20 mg Oral Q supper  . sodium chloride  3 mL Intravenous Q12H      OBJECTIVE: Physical Exam: Filed Vitals:   06/23/15 1120 06/23/15 1335 06/23/15 2002 06/24/15 0500  BP: 108/77 104/65 103/59 97/62  Pulse: 137 119 60   Temp:  98.7 F (37.1 C) 98.1 F (36.7 C) 98.1 F (36.7 C)  TempSrc:  Oral Oral Oral  Resp:  18 18 18   Height:      Weight:      SpO2: 100% 99% 99% 100%    Intake/Output Summary (Last 24 hours) at 06/24/15 0656 Last data filed at 06/23/15 2002  Gross per 24 hour  Intake   1080 ml  Output      0 ml  Net   1080 ml    Telemetry reveals SR, one 4 beat NSVT, QTc on telemetry is 470 msec.  GEN- The patient is obese, well appearing, alert and oriented x 3 today.   Head- normocephalic, atraumatic Eyes-  Sclera clear, conjunctiva pink Ears- hearing intact Oropharynx- clear Neck- supple,   Lungs- Clear to ausculation bilaterally, normal work of breathing Heart- RR rhythm  GI- soft, NT, ND Extremities- no clubbing, cyanosis, or edema Skin- no rash or lesion Psych- euthymic mood, full affect Neuro- strength and sensation are intact  LABS: Basic Metabolic Panel:  Recent Labs  06/23/15 0234 06/24/15 0238  NA 140 139  K 3.9 3.5  CL 107 108  CO2 24 24  GLUCOSE 96 98  BUN 13 15  CREATININE 0.70 0.85  CALCIUM 8.5* 8.5*  MG 2.1 2.0   EKG from overnight and this morning is SR, APCs, QTc 490 msec  ASSESSMENT AND PLAN:    Assessment/Plan: 1. Persistent atrial fibrillation The patient has persistent symptomatic atrial fibrillation and has previously undergone 2 ablations. She had multiple flutter circuits identified at last ablation and medical therapy was recommended to start Tikosyn Very complex and difficult to manage. Started Tikosyn 577mcg twice daily 06/20/14, down-titrated yesterday evening secondary to QT prolongation with dose of 262mcg She has converted to SR K+ 3.9  Will give an additional dose this morning Mag 2.1 Renal function stable Continue xarelto   2. Obesity Body mass index is 40.53 kg/(m^2). Weight loss encouarged  Baldwin [place], [place] 06/24/2015 6:56 AM   I have seen, examined the patient, and reviewed the above assessment and plan.  ON exam, RRR.  Changes to above are made where necessary.  She has converted to sinus.  QTc is 490 msec on my review.  Will continue tikosyn 250 mcg BID and monitor closely.  Co Sign: Thompson Grayer, MD 06/24/2015 9:45 AM

## 2015-06-25 LAB — BASIC METABOLIC PANEL
ANION GAP: 10 (ref 5–15)
BUN: 17 mg/dL (ref 6–20)
CALCIUM: 8.8 mg/dL — AB (ref 8.9–10.3)
CO2: 23 mmol/L (ref 22–32)
Chloride: 106 mmol/L (ref 101–111)
Creatinine, Ser: 0.92 mg/dL (ref 0.44–1.00)
GLUCOSE: 97 mg/dL (ref 65–99)
Potassium: 4.4 mmol/L (ref 3.5–5.1)
Sodium: 139 mmol/L (ref 135–145)

## 2015-06-25 LAB — MAGNESIUM: MAGNESIUM: 2 mg/dL (ref 1.7–2.4)

## 2015-06-25 MED ORDER — DILTIAZEM HCL ER COATED BEADS 240 MG PO CP24: 240.0000 mg | ORAL_CAPSULE | Freq: Every day | ORAL | Status: DC

## 2015-06-25 MED ORDER — POTASSIUM CHLORIDE CRYS ER 20 MEQ PO TBCR: 20.0000 meq | EXTENDED_RELEASE_TABLET | Freq: Every day | ORAL | Status: DC

## 2015-06-25 MED ORDER — DOFETILIDE 250 MCG PO CAPS: 250.0000 ug | ORAL_CAPSULE | Freq: Two times a day (BID) | ORAL | Status: DC

## 2015-06-25 NOTE — Discharge Summary (Signed)
ELECTROPHYSIOLOGY PROCEDURE DISCHARGE SUMMARY    Patient ID: Angela Cummings,  MRN: [id number], DOB/AGE: 62-Nov-1957 62 y.o.  Admit date: 06/22/2015 Discharge date: 06/25/2015  Primary Care Physician: Kennon Portela, MD Primary Cardiologist: D. Broadus Electrophysiologist: Dr. [name]  Primary Discharge Diagnosis:  1.  Persistent atrial fibrillation status post Tikosyn loading this admission      CHADS2Vasc is at least 1, is on xarelto and reports compliance  Secondary Discharge Diagnosis:  1. Obesity 2. OSA  Allergies  Allergen Reactions  . Adhesive [Tape] Itching and Rash    Paper tape is better but best to use no adhesive if possible     Procedures This Admission:  1.  Tikosyn loading    Brief HPI: Angela Cummings is a 60 y.o. female with a past medical history as noted above.  They were referred to EP in the outpatient setting for treatment options of atrial fibrillation.  Risks, benefits, and alternatives to Tikosyn were reviewed with the patient who wished to proceed.    Hospital Course:  The patient was admitted and Tikosyn was initiated.  Renal function and electrolytes were followed during the hospitalization.  Their QTc remained stable.   They were monitored until discharge on telemetry which demonstrated SR 60's.  On the day of discharge, they were examined by Dr [name] who considered them stable for discharge to home.  Follow-up has been arranged with the Afib clinic in 1 week for BMET, Mag, EKG and visit in 4 weeks.   Physical Exam: Filed Vitals:   06/24/15 1300 06/24/15 2001 06/25/15 0358 06/25/15 1028  BP: 103/67 117/56 101/58 95/56  Pulse: 76 54 58   Temp: 98.2 F (36.8 C) 98.2 F (36.8 C) 97.6 F (36.4 C)   TempSrc: Oral Oral Oral   Resp: 18 18 20    Height:      Weight:      SpO2: 99% 100% 100%     GEN- The patient is well appearing, obese, alert and oriented x 3 today.   HEENT: normocephalic, atraumatic; sclera clear, conjunctiva pink;  hearing intact; oropharynx clear; neck supple, no JVP Lymph- no cervical lymphadenopathy Lungs- Clear to ausculation bilaterally, normal work of breathing.  No wheezes, rales, rhonchi Heart- Regular rate and rhythm, no murmurs, rubs or gallops, PMI not laterally displaced GI- soft, non-tender, non-distended Extremities- no clubbing, cyanosis, or edema MS- no significant deformity or atrophy Skin- warm and dry, no rash or lesion Psych- euthymic mood, full affect Neuro- strength and sensation are intact   Labs:   Lab Results  Component Value Date   WBC 6.7 03/14/2015   HGB 11.1* 03/14/2015   HCT 34.5* 03/14/2015   MCV 79.5 03/14/2015   PLT 331 03/14/2015     Recent Labs Lab 06/25/15 0302  NA 139  K 4.4  CL 106  CO2 23  BUN 17  CREATININE 0.92  CALCIUM 8.8*  GLUCOSE 97     Discharge Medications:    Medication List    STOP taking these medications        propafenone 225 MG tablet  Commonly known as:  RYTHMOL      TAKE these medications        albuterol 108 (90 Base) MCG/ACT inhaler  Commonly known as:  PROVENTIL HFA;VENTOLIN HFA  Inhale 1 puff into the lungs every 6 (six) hours as needed for wheezing.  Notes to Patient:  Please see original prescribing physician for refills if needed.  Diclofenac-Misoprostol 75-0.2 MG Tbec  Take 1 tablet by mouth 2 (two) times daily.     diltiazem 240 MG 24 hr capsule  Commonly known as:  CARDIZEM CD  Take 1 capsule (240 mg total) by mouth daily.     diltiazem 30 MG tablet  Commonly known as:  CARDIZEM  Take 1 tablet (30 mg total) by mouth daily as needed.     dofetilide 250 MCG capsule  Commonly known as:  TIKOSYN  Take 1 capsule (250 mcg total) by mouth 2 (two) times daily.     pantoprazole 40 MG tablet  Commonly known as:  PROTONIX  Take 1 tablet (40 mg total) by mouth daily.     potassium chloride SA 20 MEQ tablet  Commonly known as:  K-DUR,KLOR-CON  Take 1 tablet (20 mEq total) by mouth daily.      rivaroxaban 20 MG Tabs tablet  Commonly known as:  XARELTO  TAKE 1 TABLET BY MOUTH DAILY        Disposition: Home Discharge Instructions    Diet - low sodium heart healthy    Complete by:  As directed      Increase activity slowly    Complete by:  As directed           Follow-up Information    Follow up with [facility] On 07/02/2015.   Specialty:  Cardiology   Why:  10:00AM   Contact information:   [address] [id number] [place] [employer] [phone]      Follow up with [facility] On 07/23/2015.   Specialty:  Cardiology   Why:  10:00AM   Contact information:   [address] [id number] [place] [phone]      Duration of Discharge Encounter: Greater than 30 minutes including physician time.  Signed, Tommye Standard, PA-C 06/25/2015 1:57 PM    I have seen, examined the patient, and reviewed the above assessment and plan. On exam, RRR.  Changes to above are made where necessary.    Co Sign: Thompson Grayer, MD 06/25/2015 6:53 PM

## 2015-06-25 NOTE — Progress Notes (Signed)
When patient is awake H.R. 59-60's when asleep H.r. 40's

## 2015-06-25 NOTE — Progress Notes (Signed)
Doing well this am No concerns  Telemetry reveals sinus today.  No ventricular arrhythmias No symptoms of bradycardia QT is 488 msec  Will discharge to home on current medicines Bmet, mg and ekg in 1 week  Follow-up with Butch Penny in the AF clinic in 4 weeks  Thompson Grayer MD, [facility] 06/25/2015 7:46 AM

## 2015-06-25 NOTE — Care Management Note (Addendum)
Case Management Note  Patient Details  Name: SKYY SCHRAEDER MRN: [id number] Date of Birth: 1956-05-17  Subjective/Objective:    Pt has persistent A Fib and was admitted for [facility] load           Action/Plan:   Pt is independent from home.  CM will help pt secure Tikosyn post discharge   Expected Discharge Date:                  Expected Discharge Plan:  Home/Self Care  In-House Referral:     Discharge planning Services  CM Consult, Medication Assistance  Post Acute Care Choice:    Choice offered to:     DME Arranged:    DME Agency:     HH Arranged:    [place] Agency:     Status of Service:  Completed, signed off  Medicare Important Message Given:    Date Medicare IM Given:    Medicare IM give by:    Date Additional Medicare IM Given:    Additional Medicare Important Message give by:     If discussed at [place] of Stay Meetings, dates discussed:    Additional Comments:  06/25/2015 Pt will discharge home today with 7 day supply of Tikoysn  06/22/15 CM submitted benefit check S/W TAMICKA @ [employer] # [phone]   TIKOSYN 250 MCG  BID   AND   500 MCG BID  COVER- YES                   YES  CO-PAY- 4 50.00                 $ 50.00  TIER- 3 DRUG                   3 DRUG  PRIOR APPROVAL - NO          NO   PHARMACY : [facility] # [phone]  [place], [place]  [facility]       Previous Messages      Pt informed of copay, denied hardship with paying copay amount.  CM contacted preferred pharmacy [facility] on [address] in [place]; pharmacy can get medication next day delivery.  Pt informed that she will discharge home with 7 day Tikosyn supply, CM asked pt to take prescription to pharmacy no later than day post discharge. Maryclare Labrador, RN 06/25/2015, 2:07 PM

## 2015-06-25 NOTE — Discharge Instructions (Signed)

## 2015-07-02 ENCOUNTER — Encounter (HOSPITAL_COMMUNITY): Payer: Self-pay | Admitting: Nurse Practitioner

## 2015-07-02 ENCOUNTER — Ambulatory Visit (HOSPITAL_COMMUNITY)
Admit: 2015-07-02 | Discharge: 2015-07-02 | Disposition: A | Discharge status: Home / Self Care | Payer: Commercial Managed Care - HMO | Source: Ambulatory Visit | Attending: Nurse Practitioner | Admitting: Nurse Practitioner

## 2015-07-02 VITALS — BP 132/72 | HR 62 | Ht 61.0 in | Wt 218.6 lb

## 2015-07-02 DIAGNOSIS — I4819 Other persistent atrial fibrillation: Secondary | ICD-10-CM

## 2015-07-02 DIAGNOSIS — Z6841 Body Mass Index (BMI) 40.0 and over, adult: Secondary | ICD-10-CM | POA: Diagnosis not present

## 2015-07-02 DIAGNOSIS — G4730 Sleep apnea, unspecified: Secondary | ICD-10-CM | POA: Insufficient documentation

## 2015-07-02 DIAGNOSIS — Z79899 Other long term (current) drug therapy: Secondary | ICD-10-CM | POA: Diagnosis not present

## 2015-07-02 DIAGNOSIS — M797 Fibromyalgia: Secondary | ICD-10-CM | POA: Insufficient documentation

## 2015-07-02 DIAGNOSIS — Z7902 Long term (current) use of antithrombotics/antiplatelets: Secondary | ICD-10-CM | POA: Diagnosis not present

## 2015-07-02 DIAGNOSIS — Z8249 Family history of ischemic heart disease and other diseases of the circulatory system: Secondary | ICD-10-CM | POA: Insufficient documentation

## 2015-07-02 DIAGNOSIS — I481 Persistent atrial fibrillation: Secondary | ICD-10-CM | POA: Diagnosis not present

## 2015-07-02 DIAGNOSIS — Z825 Family history of asthma and other chronic lower respiratory diseases: Secondary | ICD-10-CM | POA: Insufficient documentation

## 2015-07-02 DIAGNOSIS — E669 Obesity, unspecified: Secondary | ICD-10-CM | POA: Diagnosis not present

## 2015-07-02 DIAGNOSIS — J45909 Unspecified asthma, uncomplicated: Secondary | ICD-10-CM | POA: Insufficient documentation

## 2015-07-02 LAB — BASIC METABOLIC PANEL
Anion gap: 8 (ref 5–15)
BUN: 9 mg/dL (ref 6–20)
CHLORIDE: 109 mmol/L (ref 101–111)
CO2: 26 mmol/L (ref 22–32)
CREATININE: 0.88 mg/dL (ref 0.44–1.00)
Calcium: 9.2 mg/dL (ref 8.9–10.3)
Glucose, Bld: 102 mg/dL — ABNORMAL HIGH (ref 65–99)
Potassium: 4.3 mmol/L (ref 3.5–5.1)
SODIUM: 143 mmol/L (ref 135–145)

## 2015-07-02 LAB — MAGNESIUM: MAGNESIUM: 2 mg/dL (ref 1.7–2.4)

## 2015-07-02 MED ORDER — PANTOPRAZOLE SODIUM 40 MG PO TBEC: 40.0000 mg | DELAYED_RELEASE_TABLET | Freq: Every day | ORAL | Status: DC

## 2015-07-02 NOTE — Progress Notes (Signed)
Patient ID: Angela Cummings, female   DOB: Mar 16, 1956, 60 y.o.   MRN: [id number]     Primary Care Physician: Kennon Portela, MD Referring Physician: [facility] f/u Cardiologist: Dr. [name] is a 60 y.o. female with a h/o persistent afib recently failing Rythmol and admitted for tikosyn load 1/10 through 1/13. She required dose adjustment to 250 mg bid due to prolonged qtc. Labs were stable. She returns today saying that she feels better and heart is staying in rhythm other than 20 mins of afib that resolved after taking her am medicine. Qtc is stable at 468 ms today.  Today, she denies symptoms of palpitations, chest pain, shortness of breath, orthopnea, PND, lower extremity edema, dizziness, presyncope, syncope, or neurologic sequela. The patient is tolerating medications without difficulties and is otherwise without complaint today.   Past Medical History  Diagnosis Date  . Atrial fibrillation ([place]) 11/2010    paroxysmal  . Bradycardia   . Obesity   . Mild sleep apnea   . Asthma   . Anxiety   . Depression   . DJD (degenerative joint disease)     Cervical spine  . Osteoarthritis     Shoulders, hips,knees  . Fibromyalgia     s/p rheumatology consultation/Zeminsky   Past Surgical History  Procedure Laterality Date  . Cholecystectomy    . Cesarean section    . Knee surgery    . Neck tumor resection      benign  . Hernia repair    . Tee without cardioversion  03/13/2012    Procedure: TRANSESOPHAGEAL ECHOCARDIOGRAM (TEE);  Surgeon: [name], MD;  Location: [facility];  Service: Cardiovascular;  Laterality: N/A;  Rm 2029  . Cardioversion  03/13/2012    Procedure: CARDIOVERSION;  Surgeon: [name], MD;  Location: [facility];  Service: Cardiovascular;  Laterality: N/A;  . Cardioversion N/A 07/26/2012    Procedure: CARDIOVERSION;  Surgeon: Thompson Grayer, MD;  Location: [facility];  Service: Cardiovascular;  Laterality: N/A;  . Tee without cardioversion  N/A 09/02/2012    Procedure: TRANSESOPHAGEAL ECHOCARDIOGRAM (TEE);  Surgeon: Josue Hector, MD;  Location: [facility] ENDOSCOPY;  Service: Cardiovascular;  Laterality: N/A;  . Ablation  08/2012    PVI by Dr [name]  . Tee without cardioversion N/A 11/13/2013    Procedure: TRANSESOPHAGEAL ECHOCARDIOGRAM (TEE);  Surgeon: [name], MD;  Location: [place];  Service: Cardiovascular;  Laterality: N/A;  . Ablation  11/13/2013    PVI by Dr [name]  . Cardioversion N/A 02/20/2014    Procedure: CARDIOVERSION;  Surgeon: Larey Dresser, MD;  Location[facility] ENDOSCOPY;  Service: Cardiovascular;  Laterality: N/A;  . Atrial fibrillation ablation N/A 09/03/2012    Procedure: ATRIAL FIBRILLATION ABLATION;  Surgeon: Thompson Grayer, MD;  Location: [facility] CATH LAB;  Service: Cardiovascular;  Laterality: N/A;  . Atrial fibrillation ablation N/A 11/13/2013    Procedure: ATRIAL FIBRILLATION ABLATION;  Surgeon: Coralyn Mark, MD;  Location: [facility] CATH LAB;  Service: Cardiovascular;  Laterality: N/A;    Current Outpatient Prescriptions  Medication Sig Dispense Refill  . albuterol (PROVENTIL HFA;VENTOLIN HFA) 108 (90 BASE) MCG/ACT inhaler Inhale 1 puff into the lungs every 6 (six) hours as needed for wheezing.     . Diclofenac-Misoprostol 75-0.2 MG TBEC Take 1 tablet by mouth 2 (two) times daily. 60 tablet 5  . diltiazem (CARDIZEM CD) 240 MG 24 hr capsule Take 1 capsule (240 mg total) by mouth daily. 30 capsule 3  .  diltiazem (CARDIZEM) 30 MG tablet Take 1 tablet (30 mg total) by mouth daily as needed. (Patient taking differently: Take 30 mg by mouth daily as needed (afib). ) 30 tablet 5  . dofetilide (TIKOSYN) 250 MCG capsule Take 1 capsule (250 mcg total) by mouth 2 (two) times daily. 60 capsule 3  . pantoprazole (PROTONIX) 40 MG tablet Take 1 tablet (40 mg total) by mouth daily. 30 tablet 3  . potassium chloride SA (K-DUR,KLOR-CON) 20 MEQ tablet Take 1 tablet (20 mEq total) by mouth daily. 30 tablet 3  . rivaroxaban (XARELTO) 20  MG TABS tablet TAKE 1 TABLET BY MOUTH DAILY (Patient taking differently: Take 20 mg by mouth daily with supper. ) 90 tablet 3   No current facility-administered medications for this encounter.    Allergies  Allergen Reactions  . Adhesive [Tape] Itching and Rash    Paper tape is better but best to use no adhesive if possible    Social History   Social History  . Marital Status: Widowed    Spouse Name: N/A  . Number of Children: 3  . Years of Education: N/A   Occupational History  . Not on file.   Social History Main Topics  . Smoking status: Never Smoker   . Smokeless tobacco: Never Used  . Alcohol Use: No     Comment: Rarely  . Drug Use: No  . Sexual Activity: No   Other Topics Concern  . Not on file   Social History Narrative   Marital status: widowed since 01/2014 of melanoma; not dating.       Children:  3 children (17,30, 32) and 4 grandchildren and 1 gg. 1 adopted child.       Lives with son.  Lives in [place].        Employment:  Unemployed.       Tobacco: none      Alcohol: none      Drugs: none      Exercise: none    Family History  Problem Relation Age of Onset  . Diabetes Other   . Thyroid disease Other   . Emphysema Mother   . COPD Mother   . Anxiety disorder Mother   . Cancer Father     lung cancer; non-smoker  . Heart disease Father 59    AMI  . COPD Sister   . Emphysema Brother   . Cancer Daughter   . Cancer Sister     lung cancer  . Heart disease Sister     AMI    ROS- All systems are reviewed and negative except as per the HPI above  Physical Exam: Filed Vitals:   07/02/15 1033  BP: 132/72  Pulse: 62  Height: 5\' 1"  (1.549 m)  Weight: 218 lb 9.6 oz (99.156 kg)    GEN- The patient is well appearing, alert and oriented x 3 today.   Head- normocephalic, atraumatic Eyes-  Sclera clear, conjunctiva pink Ears- hearing intact Oropharynx- clear Neck- supple, no JVP Lymph- no cervical lymphadenopathy Lungs- Clear to ausculation  bilaterally, normal work of breathing Heart- Regular rate and rhythm, no murmurs, rubs or gallops, PMI not laterally displaced GI- soft, NT, ND, + BS Extremities- no clubbing, cyanosis, or edema MS- no significant deformity or atrophy Skin- no rash or lesion Psych- euthymic mood, full affect Neuro- strength and sensation are intact  EKG- SR with PAC's, pr int 174 ms, qrs int 108 ms, qtc 468 ms Epic records reviewed  Assessment and  Plan: 1. Persistent afib, failed Rythmol  Staying in SR with tikosyn , with stable qtc Aware of taking on regular basis, no missed doses Bmet/mag today   F/u in afib clinic in one month, sooner if needed    Butch Penny C. Lillyana Majette, [facility] [address] [place], [place] [address] [phone]

## 2015-07-23 ENCOUNTER — Ambulatory Visit (HOSPITAL_COMMUNITY): Payer: Commercial Managed Care - HMO | Admitting: Nurse Practitioner

## 2015-07-26 ENCOUNTER — Ambulatory Visit (HOSPITAL_COMMUNITY): Payer: Commercial Managed Care - HMO | Admitting: Nurse Practitioner

## 2015-07-30 ENCOUNTER — Encounter (HOSPITAL_COMMUNITY): Payer: Self-pay | Admitting: Nurse Practitioner

## 2015-07-30 ENCOUNTER — Ambulatory Visit (HOSPITAL_COMMUNITY)
Admission: RE | Admit: 2015-07-30 | Discharge: 2015-07-30 | Disposition: A | Discharge status: Home / Self Care | Payer: Commercial Managed Care - HMO | Source: Ambulatory Visit | Attending: Nurse Practitioner | Admitting: Nurse Practitioner

## 2015-07-30 VITALS — BP 126/70 | HR 75 | Ht 61.0 in | Wt 217.0 lb

## 2015-07-30 DIAGNOSIS — J45909 Unspecified asthma, uncomplicated: Secondary | ICD-10-CM | POA: Insufficient documentation

## 2015-07-30 DIAGNOSIS — Z6841 Body Mass Index (BMI) 40.0 and over, adult: Secondary | ICD-10-CM | POA: Diagnosis not present

## 2015-07-30 DIAGNOSIS — Z79899 Other long term (current) drug therapy: Secondary | ICD-10-CM | POA: Diagnosis not present

## 2015-07-30 DIAGNOSIS — Z7902 Long term (current) use of antithrombotics/antiplatelets: Secondary | ICD-10-CM | POA: Diagnosis not present

## 2015-07-30 DIAGNOSIS — I481 Persistent atrial fibrillation: Secondary | ICD-10-CM | POA: Diagnosis not present

## 2015-07-30 DIAGNOSIS — Z825 Family history of asthma and other chronic lower respiratory diseases: Secondary | ICD-10-CM | POA: Diagnosis not present

## 2015-07-30 DIAGNOSIS — M797 Fibromyalgia: Secondary | ICD-10-CM | POA: Insufficient documentation

## 2015-07-30 DIAGNOSIS — Z8249 Family history of ischemic heart disease and other diseases of the circulatory system: Secondary | ICD-10-CM | POA: Diagnosis not present

## 2015-07-30 DIAGNOSIS — G4730 Sleep apnea, unspecified: Secondary | ICD-10-CM | POA: Insufficient documentation

## 2015-07-30 DIAGNOSIS — E669 Obesity, unspecified: Secondary | ICD-10-CM | POA: Diagnosis not present

## 2015-07-30 DIAGNOSIS — I4819 Other persistent atrial fibrillation: Secondary | ICD-10-CM

## 2015-07-30 LAB — BASIC METABOLIC PANEL
ANION GAP: 8 (ref 5–15)
BUN: 10 mg/dL (ref 6–20)
CALCIUM: 8.9 mg/dL (ref 8.9–10.3)
CO2: 25 mmol/L (ref 22–32)
Chloride: 108 mmol/L (ref 101–111)
Creatinine, Ser: 0.84 mg/dL (ref 0.44–1.00)
GFR calc Af Amer: >60 mL/min (ref >60)
Glucose, Bld: 88 mg/dL (ref 65–99)
POTASSIUM: 3.9 mmol/L (ref 3.5–5.1)
Sodium: 141 mmol/L (ref 135–145)

## 2015-07-30 LAB — MAGNESIUM: MAGNESIUM: 2 mg/dL (ref 1.7–2.4)

## 2015-07-30 NOTE — Progress Notes (Signed)
Patient ID: Angela Cummings, female   DOB: [birth year], 60 y.o.   MRN: [id number]     Primary Care Physician: Kennon Portela, MD Referring Physician: [facility] f/u Cardiologist: Dr. [name] is a 60 y.o. female with a h/o persistent afib recently failing Rythmol and admitted for tikosyn load 1/10 through 1/13. She required dose adjustment to 250 mg bid due to prolonged qtc. Labs were stable. She returns today saying that she feels better and heart is staying in rhythm other than 20 mins of afib that resolved after taking her am medicine. Qtc is stable at 468 ms today.  2/17- She returns to afib clinic for [place] f/u. She had a bad day on Tuesday, feels like she may have been in afib. She is in SR today. Otherwise no complaints.   Today, she denies symptoms of palpitations, chest pain, shortness of breath, orthopnea, PND, lower extremity edema, dizziness, presyncope, syncope, or neurologic sequela. The patient is tolerating medications without difficulties and is otherwise without complaint today.   Past Medical History  Diagnosis Date  . Atrial fibrillation ([place]) 11/2010    paroxysmal  . Bradycardia   . Obesity   . Mild sleep apnea   . Asthma   . Anxiety   . Depression   . DJD (degenerative joint disease)     Cervical spine  . Osteoarthritis     Shoulders, hips,knees  . Fibromyalgia     s/p rheumatology consultation/Zeminsky   Past Surgical History  Procedure Laterality Date  . Cholecystectomy    . Cesarean section    . Knee surgery    . Neck tumor resection      benign  . Hernia repair    . Tee without cardioversion  03/13/2012    Procedure: TRANSESOPHAGEAL ECHOCARDIOGRAM (TEE);  Surgeon: [name], MD;  Location: [place];  Service: Cardiovascular;  Laterality: N/A;  Rm 2029  . Cardioversion  03/13/2012    Procedure: CARDIOVERSION;  Surgeon: [name], MD;  Location: [place];  Service: Cardiovascular;  Laterality: N/A;  .  Cardioversion N/A 07/26/2012    Procedure: CARDIOVERSION;  Surgeon: Thompson Grayer, MD;  Location: [facility];  Service: Cardiovascular;  Laterality: N/A;  . Tee without cardioversion N/A 09/02/2012    Procedure: TRANSESOPHAGEAL ECHOCARDIOGRAM (TEE);  Surgeon: Josue Hector, MD;  Location: [facility] ENDOSCOPY;  Service: Cardiovascular;  Laterality: N/A;  . Ablation  08/2012    PVI by Dr [name]  . Tee without cardioversion N/A 11/13/2013    Procedure: TRANSESOPHAGEAL ECHOCARDIOGRAM (TEE);  Surgeon: [name], MD;  Location: [place];  Service: Cardiovascular;  Laterality: N/A;  . Ablation  11/13/2013    PVI by Dr [name]  . Cardioversion N/A 02/20/2014    Procedure: CARDIOVERSION;  Surgeon: Larey Dresser, MD;  Location: [facility] ENDOSCOPY;  Service: Cardiovascular;  Laterality: N/A;  . Atrial fibrillation ablation N/A 09/03/2012    Procedure: ATRIAL FIBRILLATION ABLATION;  Surgeon: Thompson Grayer, MD;  Location: [facility] CATH LAB;  Service: Cardiovascular;  Laterality: N/A;  . Atrial fibrillation ablation N/A 11/13/2013    Procedure: ATRIAL FIBRILLATION ABLATION;  Surgeon: Coralyn Mark, MD;  Location: [facility] CATH LAB;  Service: Cardiovascular;  Laterality: N/A;    Current Outpatient Prescriptions  Medication Sig Dispense Refill  . albuterol (PROVENTIL HFA;VENTOLIN HFA) 108 (90 BASE) MCG/ACT inhaler Inhale 1 puff into the lungs every 6 (six) hours as needed for wheezing.     . Diclofenac-Misoprostol 75-0.2 MG TBEC Take  1 tablet by mouth 2 (two) times daily. 60 tablet 5  . diltiazem (CARDIZEM CD) 240 MG 24 hr capsule Take 1 capsule (240 mg total) by mouth daily. 30 capsule 3  . diltiazem (CARDIZEM) 30 MG tablet Take 1 tablet (30 mg total) by mouth daily as needed. (Patient taking differently: Take 30 mg by mouth daily as needed (afib). ) 30 tablet 5  . dofetilide (TIKOSYN) 250 MCG capsule Take 1 capsule (250 mcg total) by mouth 2 (two) times daily. 60 capsule 3  . pantoprazole (PROTONIX) 40 MG tablet Take 1 tablet (40 mg  total) by mouth daily. 30 tablet 3  . potassium chloride SA (K-DUR,KLOR-CON) 20 MEQ tablet Take 1 tablet (20 mEq total) by mouth daily. 30 tablet 3  . rivaroxaban (XARELTO) 20 MG TABS tablet TAKE 1 TABLET BY MOUTH DAILY (Patient taking differently: Take 20 mg by mouth daily with supper. ) 90 tablet 3   No current facility-administered medications for this encounter.    Allergies  Allergen Reactions  . Adhesive [Tape] Itching and Rash    Paper tape is better but best to use no adhesive if possible    Social History   Social History  . Marital Status: Widowed    Spouse Name: N/A  . Number of Children: 3  . Years of Education: N/A   Occupational History  . Not on file.   Social History Main Topics  . Smoking status: Never Smoker   . Smokeless tobacco: Never Used  . Alcohol Use: No     Comment: Rarely  . Drug Use: No  . Sexual Activity: No   Other Topics Concern  . Not on file   Social History Narrative   Marital status: widowed since 01/2014 of melanoma; not dating.       Children:  3 children (17,30, 32) and 4 grandchildren and 1 gg. 1 adopted child.       Lives with son.  Lives in [place].        Employment:  Unemployed.       Tobacco: none      Alcohol: none      Drugs: none      Exercise: none    Family History  Problem Relation Age of Onset  . Diabetes Other   . Thyroid disease Other   . Emphysema Mother   . COPD Mother   . Anxiety disorder Mother   . Cancer Father     lung cancer; non-smoker  . Heart disease Father 30    AMI  . COPD Sister   . Emphysema Brother   . Cancer Daughter   . Cancer Sister     lung cancer  . Heart disease Sister     AMI    ROS- All systems are reviewed and negative except as per the HPI above  Physical Exam: Filed Vitals:   07/30/15 0935  BP: 126/70  Pulse: 75  Height: 5\' 1"  (1.549 m)  Weight: 217 lb (98.431 kg)    GEN- The patient is well appearing, alert and oriented x 3 today.   Head- normocephalic,  atraumatic Eyes-  Sclera clear, conjunctiva pink Ears- hearing intact Oropharynx- clear Neck- supple, no JVP Lymph- no cervical lymphadenopathy Lungs- Clear to ausculation bilaterally, normal work of breathing Heart- Regular rate and rhythm, no murmurs, rubs or gallops, PMI not laterally displaced GI- soft, NT, ND, + BS Extremities- no clubbing, cyanosis, or edema MS- no significant deformity or atrophy Skin- no rash or lesion  Psych- euthymic mood, full affect Neuro- strength and sensation are intact  EKG- SR with sinus arrythmia, pr int 168 ms, qrs int 110 ms, qtc 489 ms Epic records reviewed  Assessment and Plan: 1. Persistent afib, failed Rythmol  Mostly staying in rhythm SR with tikosyn  QTc slightly longer today at 489 ms Aware of taking on regular basis, no missed doses Bmet/mag today   F/u in afib clinic Wednesday for repeat EKG only If QTc is still prolonged will discuss with Dr. Lawrence Marseilles C. Brigid Vandekamp, [facility] [address] [place], [place] [address] [phone]

## 2015-08-04 ENCOUNTER — Ambulatory Visit (HOSPITAL_COMMUNITY)
Admission: RE | Admit: 2015-08-04 | Discharge: 2015-08-04 | Disposition: A | Discharge status: Home / Self Care | Payer: Commercial Managed Care - HMO | Source: Ambulatory Visit | Attending: Nurse Practitioner | Admitting: Nurse Practitioner

## 2015-08-04 DIAGNOSIS — I481 Persistent atrial fibrillation: Secondary | ICD-10-CM | POA: Insufficient documentation

## 2015-08-04 DIAGNOSIS — I491 Atrial premature depolarization: Secondary | ICD-10-CM | POA: Diagnosis not present

## 2015-08-04 DIAGNOSIS — I493 Ventricular premature depolarization: Secondary | ICD-10-CM | POA: Diagnosis not present

## 2015-08-04 DIAGNOSIS — I480 Paroxysmal atrial fibrillation: Secondary | ICD-10-CM

## 2015-08-04 DIAGNOSIS — R9431 Abnormal electrocardiogram [ECG] [EKG]: Secondary | ICD-10-CM | POA: Diagnosis not present

## 2015-08-04 DIAGNOSIS — I4510 Unspecified right bundle-branch block: Secondary | ICD-10-CM | POA: Diagnosis not present

## 2015-08-04 NOTE — Progress Notes (Addendum)
Patient in for repeat EKG to follow up QTC. Angela Palau NP to review EKG. Ekg with SR at 69, pr int 172 ms, qrs int 112 ms, qtc 480 ms, qtc improved from last week at 489 ms. F/u in 3 months

## 2015-09-13 ENCOUNTER — Encounter: Payer: 59 | Admitting: Family Medicine

## 2015-09-23 ENCOUNTER — Other Ambulatory Visit: Payer: Self-pay | Admitting: Family Medicine

## 2015-10-05 ENCOUNTER — Encounter: Payer: 59 | Admitting: Family Medicine

## 2015-10-22 ENCOUNTER — Other Ambulatory Visit (HOSPITAL_COMMUNITY): Payer: Self-pay | Admitting: *Deleted

## 2015-10-22 ENCOUNTER — Ambulatory Visit (HOSPITAL_COMMUNITY)
Admission: RE | Admit: 2015-10-22 | Discharge: 2015-10-22 | Disposition: A | Discharge status: Home / Self Care | Payer: Commercial Managed Care - HMO | Source: Ambulatory Visit | Attending: Nurse Practitioner | Admitting: Nurse Practitioner

## 2015-10-22 ENCOUNTER — Encounter (HOSPITAL_COMMUNITY): Payer: Self-pay | Admitting: Nurse Practitioner

## 2015-10-22 VITALS — BP 120/68 | HR 74 | Ht 61.0 in | Wt 216.2 lb

## 2015-10-22 DIAGNOSIS — J45909 Unspecified asthma, uncomplicated: Secondary | ICD-10-CM | POA: Insufficient documentation

## 2015-10-22 DIAGNOSIS — Z7901 Long term (current) use of anticoagulants: Secondary | ICD-10-CM | POA: Insufficient documentation

## 2015-10-22 DIAGNOSIS — E669 Obesity, unspecified: Secondary | ICD-10-CM | POA: Diagnosis not present

## 2015-10-22 DIAGNOSIS — I481 Persistent atrial fibrillation: Secondary | ICD-10-CM | POA: Diagnosis not present

## 2015-10-22 DIAGNOSIS — G4730 Sleep apnea, unspecified: Secondary | ICD-10-CM | POA: Diagnosis not present

## 2015-10-22 DIAGNOSIS — Z825 Family history of asthma and other chronic lower respiratory diseases: Secondary | ICD-10-CM | POA: Diagnosis not present

## 2015-10-22 DIAGNOSIS — Z8249 Family history of ischemic heart disease and other diseases of the circulatory system: Secondary | ICD-10-CM | POA: Diagnosis not present

## 2015-10-22 DIAGNOSIS — M797 Fibromyalgia: Secondary | ICD-10-CM | POA: Diagnosis not present

## 2015-10-22 DIAGNOSIS — Z79899 Other long term (current) drug therapy: Secondary | ICD-10-CM | POA: Insufficient documentation

## 2015-10-22 DIAGNOSIS — Z6841 Body Mass Index (BMI) 40.0 and over, adult: Secondary | ICD-10-CM | POA: Insufficient documentation

## 2015-10-22 DIAGNOSIS — I4819 Other persistent atrial fibrillation: Secondary | ICD-10-CM

## 2015-10-22 LAB — BASIC METABOLIC PANEL
ANION GAP: 12 (ref 5–15)
BUN: 10 mg/dL (ref 6–20)
CALCIUM: 9.1 mg/dL (ref 8.9–10.3)
CHLORIDE: 104 mmol/L (ref 101–111)
CO2: 25 mmol/L (ref 22–32)
Creatinine, Ser: 0.74 mg/dL (ref 0.44–1.00)
GFR calc non Af Amer: >60 mL/min (ref >60)
GLUCOSE: 102 mg/dL — AB (ref 65–99)
POTASSIUM: 3.9 mmol/L (ref 3.5–5.1)
Sodium: 141 mmol/L (ref 135–145)

## 2015-10-22 LAB — MAGNESIUM: Magnesium: 2.2 mg/dL (ref 1.7–2.4)

## 2015-10-22 MED ORDER — DILTIAZEM HCL ER COATED BEADS 240 MG PO CP24: 240.0000 mg | ORAL_CAPSULE | Freq: Every day | ORAL | Status: DC

## 2015-10-22 MED ORDER — RIVAROXABAN 20 MG PO TABS: 20.0000 mg | ORAL_TABLET | Freq: Every day | ORAL | Status: DC

## 2015-10-22 MED ORDER — POTASSIUM CHLORIDE CRYS ER 20 MEQ PO TBCR: 20.0000 meq | EXTENDED_RELEASE_TABLET | Freq: Two times a day (BID) | ORAL | Status: DC

## 2015-10-22 MED ORDER — POTASSIUM CHLORIDE CRYS ER 20 MEQ PO TBCR: 20.0000 meq | EXTENDED_RELEASE_TABLET | Freq: Every day | ORAL | Status: DC

## 2015-10-22 MED ORDER — DOFETILIDE 250 MCG PO CAPS: 250.0000 ug | ORAL_CAPSULE | Freq: Two times a day (BID) | ORAL | Status: DC

## 2015-10-22 NOTE — Progress Notes (Signed)
Patient ID: Angela Cummings, female   DOB: 07/02/1955, 60 y.o.   MRN: [id number]     Primary Care Physician: Kennon Portela, MD Referring Physician: [facility] f/u Cardiologist: Dr. [name] is a 60 y.o. female with a h/o persistent afib recently failing Rythmol and admitted for tikosyn load 1/10 through 1/13. She required dose adjustment to 250 mg bid due to prolonged qtc. Labs were stable. She returned  1/20 saying that she felt better and heart is staying in rhythm other than 20 mins of afib that resolved after taking her am medicine. Qtc stable.   2/17- She returns to afib clinic for [place] f/u. She had a bad day on Tuesday, feels like she may have been in afib. She is in SR today. Otherwise no complaints.   Return 5/12- in afib clinic for f/u tikosyn. She feels well, no awareness of afib. Taking tikosyn and blood thinner on a regular basis. No bleeding issues.  Today, she denies symptoms of palpitations, chest pain, shortness of breath, orthopnea, PND, lower extremity edema, dizziness, presyncope, syncope, or neurologic sequela. The patient is tolerating medications without difficulties and is otherwise without complaint today.   Past Medical History  Diagnosis Date  . Atrial fibrillation ([place]) 11/2010    paroxysmal  . Bradycardia   . Obesity   . Mild sleep apnea   . Asthma   . Anxiety   . Depression   . DJD (degenerative joint disease)     Cervical spine  . Osteoarthritis     Shoulders, hips,knees  . Fibromyalgia     s/p rheumatology consultation/[facility]   Past Surgical History  Procedure Laterality Date  . Cholecystectomy    . Cesarean section    . Knee surgery    . Neck tumor resection      benign  . Hernia repair    . Tee without cardioversion  03/13/2012    Procedure: TRANSESOPHAGEAL ECHOCARDIOGRAM (TEE);  Surgeon: [name], MD;  Location: [place];  Service: Cardiovascular;  Laterality: N/A;  Rm 2029  . Cardioversion  03/13/2012   Procedure: CARDIOVERSION;  Surgeon: [name], MD;  Location: [place];  Service: Cardiovascular;  Laterality: N/A;  . Cardioversion N/A 07/26/2012    Procedure: CARDIOVERSION;  Surgeon: Thompson Grayer, MD;  Location: [place];  Service: Cardiovascular;  Laterality: N/A;  . Tee without cardioversion N/A 09/02/2012    Procedure: TRANSESOPHAGEAL ECHOCARDIOGRAM (TEE);  Surgeon: Josue Hector, MD;  Location: [facility] ENDOSCOPY;  Service: Cardiovascular;  Laterality: N/A;  . Ablation  08/2012    PVI by Dr [name]  . Tee without cardioversion N/A 11/13/2013    Procedure: TRANSESOPHAGEAL ECHOCARDIOGRAM (TEE);  Surgeon: [name], MD;  Location: [place];  Service: Cardiovascular;  Laterality: N/A;  . Ablation  11/13/2013    PVI by Dr [name]  . Cardioversion N/A 02/20/2014    Procedure: CARDIOVERSION;  Surgeon: Larey Dresser, MD;  Location: [facility] ENDOSCOPY;  Service: Cardiovascular;  Laterality: N/A;  . Atrial fibrillation ablation N/A 09/03/2012    Procedure: ATRIAL FIBRILLATION ABLATION;  Surgeon: Thompson Grayer, MD;  Location: [facility] CATH LAB;  Service: Cardiovascular;  Laterality: N/A;  . Atrial fibrillation ablation N/A 11/13/2013    Procedure: ATRIAL FIBRILLATION ABLATION;  Surgeon: Coralyn Mark, MD;  Location: [place] CATH LAB;  Service: Cardiovascular;  Laterality: N/A;    Current Outpatient Prescriptions  Medication Sig Dispense Refill  . albuterol (PROVENTIL HFA;VENTOLIN HFA) 108 (90 BASE) MCG/ACT inhaler Inhale  1 puff into the lungs every 6 (six) hours as needed for wheezing.     . Diclofenac-Misoprostol 75-0.2 MG TBEC TAKE 1 TABLET BY MOUTH TWICE DAILY 60 tablet 0  . diltiazem (CARDIZEM CD) 240 MG 24 hr capsule Take 1 capsule (240 mg total) by mouth daily. 30 capsule 3  . diltiazem (CARDIZEM) 30 MG tablet Take 1 tablet (30 mg total) by mouth daily as needed. (Patient taking differently: Take 30 mg by mouth daily as needed (afib). ) 30 tablet 5  . dofetilide (TIKOSYN) 250 MCG capsule Take 1 capsule  (250 mcg total) by mouth 2 (two) times daily. 60 capsule 3  . pantoprazole (PROTONIX) 40 MG tablet Take 1 tablet (40 mg total) by mouth daily. 30 tablet 3  . potassium chloride SA (K-DUR,KLOR-CON) 20 MEQ tablet Take 1 tablet (20 mEq total) by mouth daily. 30 tablet 3  . rivaroxaban (XARELTO) 20 MG TABS tablet TAKE 1 TABLET BY MOUTH DAILY (Patient taking differently: Take 20 mg by mouth daily with supper. ) 90 tablet 3   No current facility-administered medications for this encounter.    Allergies  Allergen Reactions  . Adhesive [Tape] Itching and Rash    Paper tape is better but best to use no adhesive if possible    Social History   Social History  . Marital Status: Widowed    Spouse Name: N/A  . Number of Children: 3  . Years of Education: N/A   Occupational History  . Not on file.   Social History Main Topics  . Smoking status: Never Smoker   . Smokeless tobacco: Never Used  . Alcohol Use: No     Comment: Rarely  . Drug Use: No  . Sexual Activity: No   Other Topics Concern  . Not on file   Social History Narrative   Marital status: widowed since 01/2014 of melanoma; not dating.       Children:  3 children (17,30, 32) and 4 grandchildren and 1 gg. 1 adopted child.       Lives with son.  Lives in [place].        Employment:  Unemployed.       Tobacco: none      Alcohol: none      Drugs: none      Exercise: none    Family History  Problem Relation Age of Onset  . Diabetes Other   . Thyroid disease Other   . Emphysema Mother   . COPD Mother   . Anxiety disorder Mother   . Cancer Father     lung cancer; non-smoker  . Heart disease Father 53    AMI  . COPD Sister   . Emphysema Brother   . Cancer Daughter   . Cancer Sister     lung cancer  . Heart disease Sister     AMI    ROS- All systems are reviewed and negative except as per the HPI above  Physical Exam: Filed Vitals:   10/22/15 0843  BP: 120/68  Pulse: 74  Height: 5\' 1"  (1.549 m)  Weight:  216 lb 3.2 oz (98.068 kg)    GEN- The patient is well appearing, alert and oriented x 3 today.   Head- normocephalic, atraumatic Eyes-  Sclera clear, conjunctiva pink Ears- hearing intact Oropharynx- clear Neck- supple, no JVP Lymph- no cervical lymphadenopathy Lungs- Clear to ausculation bilaterally, normal work of breathing Heart- Regular rate and rhythm, no murmurs, rubs or gallops, PMI not laterally displaced  GI- soft, NT, ND, + BS Extremities- no clubbing, cyanosis, or edema MS- no significant deformity or atrophy Skin- no rash or lesion Psych- euthymic mood, full affect Neuro- strength and sensation are intact  EKG- SR with PAC's, IRBBB, pr int 166 ms, qrs int 106 ms, qtc 466 ms Epic records reviewed  Assessment and Plan: 1. Persistent afib, failed Rythmol  Staying in  SR with tikosyn  QTc stable Aware of taking on regular basis, no missed doses Bmet/mag today   F/u in afib clinic  3 months    Butch Penny C. Linet Brash, [facility] [address] [place], [place] [address] [phone]

## 2015-10-26 ENCOUNTER — Other Ambulatory Visit: Payer: Self-pay | Admitting: Family Medicine

## 2015-10-27 NOTE — Telephone Encounter (Signed)
Please call patient --- she is overdue for her six month follow-up. I have provided one month refill of Diclofenac; she must be seen before further refills. I likely do not have appointment available in upcoming month; she will need to see me at the walk in clinic.

## 2015-11-02 NOTE — Telephone Encounter (Signed)
Notified pt on VM of need for f/up before more RFs needed.

## 2015-11-05 ENCOUNTER — Ambulatory Visit (HOSPITAL_COMMUNITY)
Admission: RE | Admit: 2015-11-05 | Discharge: 2015-11-05 | Disposition: A | Discharge status: Home / Self Care | Payer: Commercial Managed Care - HMO | Source: Ambulatory Visit | Attending: Nurse Practitioner | Admitting: Nurse Practitioner

## 2015-11-05 DIAGNOSIS — I481 Persistent atrial fibrillation: Secondary | ICD-10-CM | POA: Insufficient documentation

## 2015-11-05 DIAGNOSIS — I480 Paroxysmal atrial fibrillation: Secondary | ICD-10-CM | POA: Diagnosis not present

## 2015-11-05 LAB — BASIC METABOLIC PANEL
Anion gap: 9 (ref 5–15)
BUN: 17 mg/dL (ref 6–20)
CHLORIDE: 105 mmol/L (ref 101–111)
CO2: 24 mmol/L (ref 22–32)
Calcium: 9.2 mg/dL (ref 8.9–10.3)
Creatinine, Ser: 0.87 mg/dL (ref 0.44–1.00)
GFR calc Af Amer: >60 mL/min (ref >60)
GFR calc non Af Amer: >60 mL/min (ref >60)
GLUCOSE: 198 mg/dL — AB (ref 65–99)
POTASSIUM: 3.5 mmol/L (ref 3.5–5.1)
Sodium: 138 mmol/L (ref 135–145)

## 2015-11-05 MED ORDER — POTASSIUM CHLORIDE CRYS ER 20 MEQ PO TBCR: 40.0000 meq | EXTENDED_RELEASE_TABLET | Freq: Two times a day (BID) | ORAL | Status: DC

## 2015-11-07 DIAGNOSIS — M7551 Bursitis of right shoulder: Secondary | ICD-10-CM | POA: Insufficient documentation

## 2015-11-09 ENCOUNTER — Other Ambulatory Visit (HOSPITAL_COMMUNITY): Payer: Self-pay | Admitting: *Deleted

## 2015-11-09 MED ORDER — POTASSIUM CHLORIDE CRYS ER 20 MEQ PO TBCR: 40.0000 meq | EXTENDED_RELEASE_TABLET | Freq: Two times a day (BID) | ORAL | Status: DC

## 2015-11-15 ENCOUNTER — Ambulatory Visit (HOSPITAL_COMMUNITY)
Admission: RE | Admit: 2015-11-15 | Discharge: 2015-11-15 | Disposition: A | Discharge status: Home / Self Care | Payer: Commercial Managed Care - HMO | Source: Ambulatory Visit | Attending: Nurse Practitioner | Admitting: Nurse Practitioner

## 2015-11-15 DIAGNOSIS — I480 Paroxysmal atrial fibrillation: Secondary | ICD-10-CM | POA: Diagnosis not present

## 2015-11-15 LAB — BASIC METABOLIC PANEL
Anion gap: 6 (ref 5–15)
BUN: 11 mg/dL (ref 6–20)
CHLORIDE: 106 mmol/L (ref 101–111)
CO2: 27 mmol/L (ref 22–32)
Calcium: 9 mg/dL (ref 8.9–10.3)
Creatinine, Ser: 1.07 mg/dL — ABNORMAL HIGH (ref 0.44–1.00)
GFR, EST NON AFRICAN AMERICAN: 54 mL/min — AB (ref >60)
Glucose, Bld: 78 mg/dL (ref 65–99)
POTASSIUM: 4.1 mmol/L (ref 3.5–5.1)
SODIUM: 139 mmol/L (ref 135–145)

## 2015-11-23 ENCOUNTER — Other Ambulatory Visit: Payer: Self-pay | Admitting: Family Medicine

## 2015-12-23 ENCOUNTER — Other Ambulatory Visit: Payer: Self-pay | Admitting: Family Medicine

## 2015-12-30 ENCOUNTER — Ambulatory Visit (INDEPENDENT_AMBULATORY_CARE_PROVIDER_SITE_OTHER): Payer: Commercial Managed Care - HMO | Admitting: Family Medicine

## 2015-12-30 VITALS — BP 134/80 | HR 69 | Temp 98.1°F | Resp 18 | Ht 60.5 in | Wt 212.4 lb

## 2015-12-30 DIAGNOSIS — I480 Paroxysmal atrial fibrillation: Secondary | ICD-10-CM | POA: Diagnosis not present

## 2015-12-30 DIAGNOSIS — Z114 Encounter for screening for human immunodeficiency virus [HIV]: Secondary | ICD-10-CM

## 2015-12-30 DIAGNOSIS — R7302 Impaired glucose tolerance (oral): Secondary | ICD-10-CM | POA: Diagnosis not present

## 2015-12-30 DIAGNOSIS — M79672 Pain in left foot: Secondary | ICD-10-CM

## 2015-12-30 DIAGNOSIS — F4321 Adjustment disorder with depressed mood: Secondary | ICD-10-CM | POA: Diagnosis not present

## 2015-12-30 DIAGNOSIS — Z1159 Encounter for screening for other viral diseases: Secondary | ICD-10-CM

## 2015-12-30 DIAGNOSIS — M797 Fibromyalgia: Secondary | ICD-10-CM | POA: Diagnosis not present

## 2015-12-30 DIAGNOSIS — K219 Gastro-esophageal reflux disease without esophagitis: Secondary | ICD-10-CM | POA: Diagnosis not present

## 2015-12-30 LAB — COMPREHENSIVE METABOLIC PANEL
ALBUMIN: 3.8 g/dL (ref 3.6–5.1)
ALT: 14 U/L (ref 6–29)
AST: 16 U/L (ref 10–35)
Alkaline Phosphatase: 185 U/L — ABNORMAL HIGH (ref 33–130)
BUN: 10 mg/dL (ref 7–25)
CALCIUM: 8.6 mg/dL (ref 8.6–10.4)
CHLORIDE: 104 mmol/L (ref 98–110)
CO2: 24 mmol/L (ref 20–31)
Creat: 0.7 mg/dL (ref 0.50–0.99)
GLUCOSE: 95 mg/dL (ref 65–99)
POTASSIUM: 4.4 mmol/L (ref 3.5–5.3)
Sodium: 139 mmol/L (ref 135–146)
Total Bilirubin: 0.4 mg/dL (ref 0.2–1.2)
Total Protein: 6.4 g/dL (ref 6.1–8.1)

## 2015-12-30 LAB — CBC WITH DIFFERENTIAL/PLATELET
BASOS ABS: 63 {cells}/uL (ref 0–200)
Basophils Relative: 1 %
EOS PCT: 3 %
Eosinophils Absolute: 189 cells/uL (ref 15–500)
HCT: 32.5 % — ABNORMAL LOW (ref 35.0–45.0)
Hemoglobin: 10 g/dL — ABNORMAL LOW (ref 11.7–15.5)
LYMPHS PCT: 32 %
Lymphs Abs: 2016 cells/uL (ref 850–3900)
MCH: 23 pg — AB (ref 27.0–33.0)
MCHC: 30.8 g/dL — AB (ref 32.0–36.0)
MCV: 74.9 fL — AB (ref 80.0–100.0)
MONOS PCT: 11 %
MPV: 10.5 fL (ref 7.5–12.5)
Monocytes Absolute: 693 cells/uL (ref 200–950)
NEUTROS ABS: 3339 {cells}/uL (ref 1500–7800)
Neutrophils Relative %: 53 %
PLATELETS: 358 10*3/uL (ref 140–400)
RBC: 4.34 MIL/uL (ref 3.80–5.10)
RDW: 16.7 % — AB (ref 11.0–15.0)
WBC: 6.3 10*3/uL (ref 3.8–10.8)

## 2015-12-30 LAB — POCT GLYCOSYLATED HEMOGLOBIN (HGB A1C): HEMOGLOBIN A1C: 6.1

## 2015-12-30 LAB — GLUCOSE, POCT (MANUAL RESULT ENTRY): POC GLUCOSE: 108 mg/dL — AB (ref 70–99)

## 2015-12-30 MED ORDER — DICLOFENAC SODIUM 1 % TD GEL: 4.0000 g | Freq: Four times a day (QID) | TRANSDERMAL | Status: DC

## 2015-12-30 MED ORDER — ALBUTEROL SULFATE HFA 108 (90 BASE) MCG/ACT IN AERS: 2.0000 | INHALATION_SPRAY | Freq: Four times a day (QID) | RESPIRATORY_TRACT | Status: DC | PRN

## 2015-12-30 MED ORDER — FLUOXETINE HCL 20 MG PO TABS: 20.0000 mg | ORAL_TABLET | Freq: Every day | ORAL | Status: DC

## 2015-12-30 NOTE — Patient Instructions (Addendum)
1. Apply vaseline to heel twice daily; apply triple antibiotic ointment/cream bid.   2. Soak heel once daily with warm water. 3. Avoid flip flops.       IF you received an x-ray today, you will receive an invoice from [facility]. Please contact [facility] at [phone] with questions or concerns regarding your invoice.   IF you received labwork today, you will receive an invoice from [employer]. Please contact [facility] at [phone] with questions or concerns regarding your invoice.   Our billing staff will not be able to assist you with questions regarding bills from these companies.  You will be contacted with the lab results as soon as they are available. The fastest way to get your results is to activate your My Chart account. Instructions are located on the last page of this paperwork. If you have not heard from [place] regarding the results in 2 weeks, please contact this office.     We recommend that you schedule a mammogram for breast cancer screening. Typically, you do not need a referral to do this. Please contact a local imaging center to schedule your mammogram.  [facility] - [phone]  *ask for the Radiology Department [facility] - [phone] or [phone]  [facility] - [phone] [place] [phone] [facility] - [phone]  *ask for the [facility] - [phone]  *ask for the Radiology Department [facility] - [phone]  *ask for the [facility] - [phone]

## 2015-12-30 NOTE — Progress Notes (Signed)
Subjective:    Patient ID: Angela Cummings, female    DOB: 04/08/1956, 60 y.o.   MRN: [id number]  12/30/2015  Medication Refill (Diclofenac; Albuterol) and Foot Pain (Lt heel pain x yesterday ? FB stepped on stone)   HPI This 60 y.o. female presents for eight month follow-up:   1.  L heel pain: stepped on something on the ground; having heel pain.  Has sandles and might be a rhinstone  2.  Fractured leg: grandson dropped popsicle and slipped on popsicle.  Leg fracture.  Also having R shoulder pain.  No MRI yet of shoulder. S/p xray of shoulder R.  L leg.    3.  Glucose intolerance: due for labs.  4. Atrial fibrillation:  Atrial Fibrillation clinic at [facility]; also sees [facility].    5. Situational depression: not great; lost husband two years ago this time of year.    6. Fibromyalgia: needs refill of Diclofenac. Has Tramadol without relief.    7. Asthma: needs refill of Albuterol.  Duration two days.   8. Osteoarthritis: legs, hips, knees, feet.  9> GERD: protonix daily.   Colonoscopy in [place] two years ago.    Review of Systems  Constitutional: Negative for fever, chills, diaphoresis and fatigue.  Eyes: Negative for visual disturbance.  Respiratory: Negative for cough and shortness of breath.   Cardiovascular: Negative for chest pain, palpitations and leg swelling.  Gastrointestinal: Negative for nausea, vomiting, abdominal pain, diarrhea and constipation.  Endocrine: Negative for cold intolerance, heat intolerance, polydipsia, polyphagia and polyuria.  Musculoskeletal: Positive for arthralgias, gait problem and myalgias. Negative for joint swelling.  Neurological: Negative for dizziness, tremors, seizures, syncope, facial asymmetry, speech difficulty, weakness, light-headedness, numbness and headaches.  Psychiatric/Behavioral: Positive for dysphoric mood. Negative for self-injury, sleep disturbance and suicidal ideas. The patient is not nervous/anxious.     Past Medical  History:  Diagnosis Date  . Anxiety   . Asthma   . Atrial fibrillation ([place]) 11/2010   paroxysmal  . Bradycardia   . Depression   . DJD (degenerative joint disease)    Cervical spine  . Fibromyalgia    s/p rheumatology consultation/[facility]  . Mild sleep apnea   . Obesity   . Osteoarthritis    Shoulders, hips,knees   Past Surgical History:  Procedure Laterality Date  . ABLATION  08/2012   PVI by Dr [name]  . ABLATION  11/13/2013   PVI by Dr [name]  . ATRIAL FIBRILLATION ABLATION N/A 09/03/2012   Procedure: ATRIAL FIBRILLATION ABLATION;  Surgeon: Thompson Grayer, MD;  Location: [facility] CATH LAB;  Service: Cardiovascular;  Laterality: N/A;  . ATRIAL FIBRILLATION ABLATION N/A 11/13/2013   Procedure: ATRIAL FIBRILLATION ABLATION;  Surgeon: Coralyn Mark, MD;  Location: [place] CATH LAB;  Service: Cardiovascular;  Laterality: N/A;  . CARDIOVERSION  03/13/2012   Procedure: CARDIOVERSION;  Surgeon: [name], MD;  Location: [facility] ENDOSCOPY;  Service: Cardiovascular;  Laterality: N/A;  . CARDIOVERSION N/A 07/26/2012   Procedure: CARDIOVERSION;  Surgeon: Thompson Grayer, MD;  Location: [place];  Service: Cardiovascular;  Laterality: N/A;  . CARDIOVERSION N/A 02/20/2014   Procedure: CARDIOVERSION;  Surgeon: Larey Dresser, MD;  Location: [place];  Service: Cardiovascular;  Laterality: N/A;  . CESAREAN SECTION    . CHOLECYSTECTOMY    . HERNIA REPAIR    . KNEE SURGERY    . neck tumor resection     benign  . TEE WITHOUT CARDIOVERSION  03/13/2012   Procedure: TRANSESOPHAGEAL ECHOCARDIOGRAM (TEE);  Surgeon: Arnette Norris  Deboraha Sprang, MD;  Location: [place];  Service: Cardiovascular;  Laterality: N/A;  Rm 2029  . TEE WITHOUT CARDIOVERSION N/A 09/02/2012   Procedure: TRANSESOPHAGEAL ECHOCARDIOGRAM (TEE);  Surgeon: Josue Hector, MD;  Location: [place];  Service: Cardiovascular;  Laterality: N/A;  . TEE WITHOUT CARDIOVERSION N/A 11/13/2013   Procedure: TRANSESOPHAGEAL ECHOCARDIOGRAM (TEE);  Surgeon: [name], MD;  Location: [place];  Service: Cardiovascular;  Laterality: N/A;   Allergies  Allergen Reactions  . Adhesive [Tape] Itching and Rash    Paper tape is better but best to use no adhesive if possible    Social History   Social History  . Marital status: Widowed    Spouse name: N/A  . Number of children: 3  . Years of education: N/A   Occupational History  . Not on file.   Social History Main Topics  . Smoking status: Never Smoker  . Smokeless tobacco: Never Used  . Alcohol use No     Comment: Rarely  . Drug use: No  . Sexual activity: No   Other Topics Concern  . Not on file   Social History Narrative   Marital status: widowed since 01/2014 of melanoma; not dating.       Children:  3 children (17,30, 32) and 4 grandchildren and 1 gg. 1 adopted child.       Lives with son.  Lives in [place].        Employment:  Unemployed.       Tobacco: none      Alcohol: none      Drugs: none      Exercise: none   Family History  Problem Relation Age of Onset  . Emphysema Mother   . COPD Mother   . Anxiety disorder Mother   . Cancer Father     lung cancer; non-smoker  . Heart disease Father 75    AMI  . COPD Sister   . Emphysema Brother   . Cancer Daughter   . Cancer Sister     lung cancer  . Heart disease Sister     AMI  . Diabetes Other   . Thyroid disease Other        Objective:    BP 134/80   Pulse 69   Temp 98.1 F (36.7 C) (Oral)   Resp 18   Ht 5' 0.5" (1.537 m)   Wt 212 lb 6.4 oz (96.3 kg)   SpO2 98%   BMI 40.80 kg/m   Physical Exam  Constitutional: She is oriented to person, place, and time. She appears well-developed and well-nourished. No distress.  HENT:  Head: Normocephalic and atraumatic.  Right Ear: External ear normal.  Left Ear: External ear normal.  Nose: Nose normal.  Mouth/Throat: Oropharynx is clear and moist.  Eyes: Conjunctivae and EOM are normal. Pupils are equal, round, and reactive to light.  Neck: Normal range  of motion. Neck supple. Carotid bruit is not present. No thyromegaly present.  Cardiovascular: Normal rate, regular rhythm, normal heart sounds and intact distal pulses.  Exam reveals no gallop and no friction rub.   No murmur heard. Pulmonary/Chest: Effort normal and breath sounds normal. She has no wheezes. She has no rales.  Abdominal: Soft. Bowel sounds are normal. She exhibits no distension and no mass. There is no tenderness. There is no rebound and no guarding.  Musculoskeletal:       Left foot: Normal. There is normal range of motion, no tenderness, no bony tenderness,  no swelling, normal capillary refill, no crepitus, no deformity and no laceration.  Lymphadenopathy:    She has no cervical adenopathy.  Neurological: She is alert and oriented to person, place, and time. No cranial nerve deficit.  Skin: Skin is warm and dry. No rash noted. She is not diaphoretic. No erythema. No pallor.  Psychiatric: She has a normal mood and affect. Her behavior is normal.   Results for orders placed or performed in visit on 12/30/15  CBC with Differential/Platelet  Result Value Ref Range   WBC 6.3 3.8 - 10.8 K/uL   RBC 4.34 3.80 - 5.10 MIL/uL   Hemoglobin 10.0 (L) 11.7 - 15.5 g/dL   HCT 32.5 (L) 35.0 - 45.0 %   MCV 74.9 (L) 80.0 - 100.0 fL   MCH 23.0 (L) 27.0 - 33.0 pg   MCHC 30.8 (L) 32.0 - 36.0 g/dL   RDW 16.7 (H) 11.0 - 15.0 %   Platelets 358 140 - 400 K/uL   MPV 10.5 7.5 - 12.5 fL   Neutro Abs 3,339 1,500 - 7,800 cells/uL   Lymphs Abs 2,016 850 - 3,900 cells/uL   Monocytes Absolute 693 200 - 950 cells/uL   Eosinophils Absolute 189 15 - 500 cells/uL   Basophils Absolute 63 0 - 200 cells/uL   Neutrophils Relative % 53 %   Lymphocytes Relative 32 %   Monocytes Relative 11 %   Eosinophils Relative 3 %   Basophils Relative 1 %   Smear Review Criteria for review not met   Hepatitis C antibody  Result Value Ref Range   HCV Ab NEGATIVE NEGATIVE  HIV antibody  Result Value Ref Range    HIV 1&2 Ab, 4th Generation NONREACTIVE NONREACTIVE  Comprehensive metabolic panel  Result Value Ref Range   Sodium 139 135 - 146 mmol/L   Potassium 4.4 3.5 - 5.3 mmol/L   Chloride 104 98 - 110 mmol/L   CO2 24 20 - 31 mmol/L   Glucose, Bld 95 65 - 99 mg/dL   BUN 10 7 - 25 mg/dL   Creat 0.70 0.50 - 0.99 mg/dL   Total Bilirubin 0.4 0.2 - 1.2 mg/dL   Alkaline Phosphatase 185 (H) 33 - 130 U/L   AST 16 10 - 35 U/L   ALT 14 6 - 29 U/L   Total Protein 6.4 6.1 - 8.1 g/dL   Albumin 3.8 3.6 - 5.1 g/dL   Calcium 8.6 8.6 - 10.4 mg/dL  POCT glycosylated hemoglobin (Hb A1C)  Result Value Ref Range   Hemoglobin A1C 6.1   POCT glucose (manual entry)  Result Value Ref Range   POC Glucose 108 (A) 70 - 99 mg/dl       Assessment & Plan:   1. Glucose intolerance (impaired glucose tolerance)   2. Paroxysmal atrial fibrillation (HCC)   3. Gastroesophageal reflux disease without esophagitis   4. Situational depression   5. Fibromyalgia   6. Heel pain, left   7. Need for hepatitis C screening test   8. Screening for HIV (human immunodeficiency virus)    -stable. -recommend weight loss, exercise, low-sugar food choices. -no evidence of foreign body in L heel; recommend supportive care with rest, icing, elevation, and soaking as indicated. -refills provided. -obtain age appropriate screening labs.   Orders Placed This Encounter  Procedures  . CBC with Differential/Platelet  . Hepatitis C antibody  . HIV antibody  . Comprehensive metabolic panel  . POCT glycosylated hemoglobin (Hb A1C)  . POCT glucose (manual entry)  Meds ordered this encounter  Medications  . FLUoxetine (PROZAC) 20 MG tablet    Sig: Take 1 tablet (20 mg total) by mouth daily.    Dispense:  30 tablet    Refill:  5  . albuterol (PROVENTIL HFA;VENTOLIN HFA) 108 (90 Base) MCG/ACT inhaler    Sig: Inhale 2 puffs into the lungs every 6 (six) hours as needed.    Dispense:  1 Inhaler    Refill:  1  . diclofenac sodium  (VOLTAREN) 1 % GEL    Sig: Apply 4 g topically 4 (four) times daily.    Dispense:  100 g    Refill:  3    Return in about 6 months (around 07/01/2016) for recheck.    Kristi Elayne Guerin, M.D. [facility] [address] [place], [place]  [address] [phone] phone [phone] fax

## 2015-12-31 LAB — HEPATITIS C ANTIBODY: HCV AB: NEGATIVE

## 2015-12-31 LAB — HIV ANTIBODY (ROUTINE TESTING W REFLEX): HIV 1&2 Ab, 4th Generation: NONREACTIVE

## 2016-01-11 ENCOUNTER — Telehealth: Payer: Self-pay

## 2016-01-11 NOTE — Telephone Encounter (Signed)
Patient stated she is waking up in the middle of the night with pain. Patient stated the cream isn't working. Patient request for another medication. [phone].

## 2016-01-12 NOTE — Telephone Encounter (Signed)
Spoke with pt, she states she is still having leg pain and shoulder  Pain. She wants to take the risk of taking a different medication because she is in so much pain.

## 2016-01-13 NOTE — Telephone Encounter (Signed)
Call --- please clarify message with patient.  Does she have an orthopedist who she has seen for her leg pain and/or shoulder pain?  If so, how long ago has it been since she saw the orthopedist?  What is she taking now and how often for pain?

## 2016-01-14 NOTE — Telephone Encounter (Signed)
Pt states she is at the orthopedic office today so she will discuss with him. She has to have surgery for an 80% rotator cuff tear.

## 2016-01-14 NOTE — Telephone Encounter (Signed)
Left message for pt to call back  °

## 2016-01-17 NOTE — Telephone Encounter (Signed)
Noted. No further action warranted. 

## 2016-01-21 ENCOUNTER — Ambulatory Visit (HOSPITAL_COMMUNITY)
Admission: RE | Admit: 2016-01-21 | Discharge: 2016-01-21 | Disposition: A | Discharge status: Home / Self Care | Payer: Commercial Managed Care - HMO | Source: Ambulatory Visit | Attending: Nurse Practitioner | Admitting: Nurse Practitioner

## 2016-01-21 ENCOUNTER — Encounter (HOSPITAL_COMMUNITY): Payer: Self-pay | Admitting: Nurse Practitioner

## 2016-01-21 VITALS — BP 132/76 | HR 128 | Ht 60.5 in | Wt 206.8 lb

## 2016-01-21 DIAGNOSIS — M797 Fibromyalgia: Secondary | ICD-10-CM | POA: Diagnosis not present

## 2016-01-21 DIAGNOSIS — Z7901 Long term (current) use of anticoagulants: Secondary | ICD-10-CM | POA: Diagnosis not present

## 2016-01-21 DIAGNOSIS — E669 Obesity, unspecified: Secondary | ICD-10-CM | POA: Diagnosis not present

## 2016-01-21 DIAGNOSIS — J45909 Unspecified asthma, uncomplicated: Secondary | ICD-10-CM | POA: Diagnosis not present

## 2016-01-21 DIAGNOSIS — Z8249 Family history of ischemic heart disease and other diseases of the circulatory system: Secondary | ICD-10-CM | POA: Diagnosis not present

## 2016-01-21 DIAGNOSIS — Z825 Family history of asthma and other chronic lower respiratory diseases: Secondary | ICD-10-CM | POA: Insufficient documentation

## 2016-01-21 DIAGNOSIS — Z6839 Body mass index (BMI) 39.0-39.9, adult: Secondary | ICD-10-CM | POA: Diagnosis not present

## 2016-01-21 DIAGNOSIS — F329 Major depressive disorder, single episode, unspecified: Secondary | ICD-10-CM | POA: Insufficient documentation

## 2016-01-21 DIAGNOSIS — Z79899 Other long term (current) drug therapy: Secondary | ICD-10-CM | POA: Insufficient documentation

## 2016-01-21 DIAGNOSIS — F419 Anxiety disorder, unspecified: Secondary | ICD-10-CM | POA: Diagnosis not present

## 2016-01-21 DIAGNOSIS — I481 Persistent atrial fibrillation: Secondary | ICD-10-CM | POA: Diagnosis not present

## 2016-01-21 DIAGNOSIS — Z8349 Family history of other endocrine, nutritional and metabolic diseases: Secondary | ICD-10-CM | POA: Insufficient documentation

## 2016-01-21 DIAGNOSIS — I4891 Unspecified atrial fibrillation: Secondary | ICD-10-CM | POA: Diagnosis present

## 2016-01-21 DIAGNOSIS — G4730 Sleep apnea, unspecified: Secondary | ICD-10-CM | POA: Diagnosis not present

## 2016-01-21 DIAGNOSIS — I480 Paroxysmal atrial fibrillation: Secondary | ICD-10-CM

## 2016-01-21 LAB — BASIC METABOLIC PANEL
Anion gap: 8 (ref 5–15)
BUN: 11 mg/dL (ref 6–20)
CHLORIDE: 104 mmol/L (ref 101–111)
CO2: 24 mmol/L (ref 22–32)
Calcium: 9 mg/dL (ref 8.9–10.3)
Creatinine, Ser: 0.81 mg/dL (ref 0.44–1.00)
GFR calc Af Amer: >60 mL/min (ref >60)
GFR calc non Af Amer: >60 mL/min (ref >60)
Glucose, Bld: 99 mg/dL (ref 65–99)
POTASSIUM: 4 mmol/L (ref 3.5–5.1)
SODIUM: 136 mmol/L (ref 135–145)

## 2016-01-21 LAB — MAGNESIUM: MAGNESIUM: 2.1 mg/dL (ref 1.7–2.4)

## 2016-01-21 NOTE — Progress Notes (Addendum)
Patient ID: Angela Cummings, female   DOB: Oct 31, 1955, 60 y.o.   MRN: [id number]     Primary Care Physician: Reginia Forts, MD Referring Physician: [name] f/u Cardiologist: Dr. [name] is a 60 y.o. female with a h/o persistent afib recently failing Rythmol and admitted for tikosyn load 1/10 through 1/13. She required dose adjustment to 250 mg bid due to prolonged qtc. Labs were stable. She returned  1/20 saying that she felt better and heart is staying in rhythm other than 20 mins of afib that resolved after taking her am medicine. Qtc stable.   2/17- She returns to afib clinic for [place] f/u. She had a bad day on Tuesday, feels like she may have been in afib. She is in SR today. Otherwise no complaints.   Return 5/12- in afib clinic for f/u tikosyn. She feels well, no awareness of afib. Taking tikosyn and blood thinner on a regular basis. No bleeding issues.  F/u in afib clinic- 8/11- Pt is in afib today with rvr. States needs shoulder surgery due to rotator cuff problems, and is under stress raising her great granddaughter. She feels as though she has been having more afib later. Is taking her tikosyn on a regular basis.  Today, she denies symptoms of palpitations, chest pain, shortness of breath, orthopnea, PND, lower extremity edema, dizziness, presyncope, syncope, or neurologic sequela. The patient is tolerating medications without difficulties and is otherwise without complaint today.   Past Medical History:  Diagnosis Date  . Anxiety   . Asthma   . Atrial fibrillation ([place]) 11/2010   paroxysmal  . Bradycardia   . Depression   . DJD (degenerative joint disease)    Cervical spine  . Fibromyalgia    s/p rheumatology consultation/Zeminsky  . Mild sleep apnea   . Obesity   . Osteoarthritis    Shoulders, hips,knees   Past Surgical History:  Procedure Laterality Date  . ABLATION  08/2012   PVI by Dr [name]  . ABLATION  11/13/2013   PVI by Dr [name]  . ATRIAL  FIBRILLATION ABLATION N/A 09/03/2012   Procedure: ATRIAL FIBRILLATION ABLATION;  Surgeon: Thompson Grayer, MD;  Location: [facility] CATH LAB;  Service: Cardiovascular;  Laterality: N/A;  . ATRIAL FIBRILLATION ABLATION N/A 11/13/2013   Procedure: ATRIAL FIBRILLATION ABLATION;  Surgeon: Coralyn Mark, MD;  Location: [place] CATH LAB;  Service: Cardiovascular;  Laterality: N/A;  . CARDIOVERSION  03/13/2012   Procedure: CARDIOVERSION;  Surgeon: [name], MD;  Location: [facility] ENDOSCOPY;  Service: Cardiovascular;  Laterality: N/A;  . CARDIOVERSION N/A 07/26/2012   Procedure: CARDIOVERSION;  Surgeon: Thompson Grayer, MD;  Location: [place];  Service: Cardiovascular;  Laterality: N/A;  . CARDIOVERSION N/A 02/20/2014   Procedure: CARDIOVERSION;  Surgeon: Larey Dresser, MD;  Location: [place];  Service: Cardiovascular;  Laterality: N/A;  . CESAREAN SECTION    . CHOLECYSTECTOMY    . HERNIA REPAIR    . KNEE SURGERY    . neck tumor resection     benign  . TEE WITHOUT CARDIOVERSION  03/13/2012   Procedure: TRANSESOPHAGEAL ECHOCARDIOGRAM (TEE);  Surgeon: [name], MD;  Location: [place];  Service: Cardiovascular;  Laterality: N/A;  Rm 2029  . TEE WITHOUT CARDIOVERSION N/A 09/02/2012   Procedure: TRANSESOPHAGEAL ECHOCARDIOGRAM (TEE);  Surgeon: Josue Hector, MD;  Location: [place];  Service: Cardiovascular;  Laterality: N/A;  . TEE WITHOUT CARDIOVERSION N/A 11/13/2013   Procedure: TRANSESOPHAGEAL ECHOCARDIOGRAM (TEE);  Surgeon: Wonda Cheng Nahser,  MD;  Location: [place] ENDOSCOPY;  Service: Cardiovascular;  Laterality: N/A;    Current Outpatient Prescriptions  Medication Sig Dispense Refill  . albuterol (PROVENTIL HFA;VENTOLIN HFA) 108 (90 Base) MCG/ACT inhaler Inhale 2 puffs into the lungs every 6 (six) hours as needed. 1 Inhaler 1  . diclofenac sodium (VOLTAREN) 1 % GEL Apply 4 g topically 4 (four) times daily. 100 g 3  . Diclofenac-Misoprostol 75-0.2 MG TBEC TAKE 1 TABLET BY MOUTH TWICE DAILY 60 tablet 0  .  diltiazem (CARDIZEM CD) 240 MG 24 hr capsule Take 1 capsule (240 mg total) by mouth daily. 90 capsule 3  . diltiazem (CARDIZEM) 30 MG tablet Take 1 tablet (30 mg total) by mouth daily as needed. (Patient taking differently: Take 30 mg by mouth daily as needed (afib). ) 30 tablet 5  . dofetilide (TIKOSYN) 250 MCG capsule Take 1 capsule (250 mcg total) by mouth 2 (two) times daily. 180 capsule 3  . FLUoxetine (PROZAC) 20 MG tablet Take 1 tablet (20 mg total) by mouth daily. 30 tablet 5  . pantoprazole (PROTONIX) 40 MG tablet Take 1 tablet (40 mg total) by mouth daily. 30 tablet 3  . potassium chloride SA (K-DUR,KLOR-CON) 20 MEQ tablet Take 2 tablets (40 mEq total) by mouth 2 (two) times daily. 120 tablet 6  . rivaroxaban (XARELTO) 20 MG TABS tablet Take 1 tablet (20 mg total) by mouth daily with supper. 90 tablet 3   No current facility-administered medications for this encounter.     Allergies  Allergen Reactions  . Adhesive [Tape] Itching and Rash    Paper tape is better but best to use no adhesive if possible    Social History   Social History  . Marital status: Widowed    Spouse name: N/A  . Number of children: 3  . Years of education: N/A   Occupational History  . Not on file.   Social History Main Topics  . Smoking status: Never Smoker  . Smokeless tobacco: Never Used  . Alcohol use No     Comment: Rarely  . Drug use: No  . Sexual activity: No   Other Topics Concern  . Not on file   Social History Narrative   Marital status: widowed since 01/2014 of melanoma; not dating.       Children:  3 children (17,30, 32) and 4 grandchildren and 1 gg. 1 adopted child.       Lives with son.  Lives in [place].        Employment:  Unemployed.       Tobacco: none      Alcohol: none      Drugs: none      Exercise: none    Family History  Problem Relation Age of Onset  . Diabetes Other   . Thyroid disease Other   . Emphysema Mother   . COPD Mother   . Anxiety disorder  Mother   . Cancer Father     lung cancer; non-smoker  . Heart disease Father 21    AMI  . COPD Sister   . Emphysema Brother   . Cancer Daughter   . Cancer Sister     lung cancer  . Heart disease Sister     AMI    ROS- All systems are reviewed and negative except as per the HPI above  Physical Exam: Vitals:   01/21/16 0909  BP: 132/76  Pulse: (!) 128  Weight: 206 lb 12.8 oz (93.8 kg)  Height:  5' 0.5" (1.537 m)    GEN- The patient is well appearing, alert and oriented x 3 today.   Head- normocephalic, atraumatic Eyes-  Sclera clear, conjunctiva pink Ears- hearing intact Oropharynx- clear Neck- supple, no JVP Lymph- no cervical lymphadenopathy Lungs- Clear to ausculation bilaterally, normal work of breathing Heart- Regular rate and rhythm, no murmurs, rubs or gallops, PMI not laterally displaced GI- soft, NT, ND, + BS Extremities- no clubbing, cyanosis, or edema MS- no significant deformity or atrophy Skin- no rash or lesion Psych- euthymic mood, full affect Neuro- strength and sensation are intact  EKG- SR with PAC's, IRBBB, pr int 166 ms, qrs int 106 ms, qtc 466 ms Epic records reviewed  Assessment and Plan: 1. PAF Has been staying in SR, but she is in afib today with rvr Encouraged to take 30 mg Cardizem as needed for rate control No missed doses of Tikosyn Continue eliquis  Bmet/mag today   F/u in afib clinic on Monday, if remains in afib over the weekend, will plan for cardioversion   Butch Penny C. [facility] [address] [place], [place] [address] [phone]     8/14- Pt returns for repeat EKG and shows persistent afib at 152 bpm. She has been in afib over the weekend. States no missed doses of xarelto and no missed doses of tikosyn. Will arrange for cardioversion tomorrow at 11am. Labs drawn.  Geroge Baseman Nasier Thumm, [facility] [address] [place], [place]  [address] [phone]

## 2016-01-24 ENCOUNTER — Ambulatory Visit (HOSPITAL_COMMUNITY)
Admission: RE | Admit: 2016-01-24 | Discharge: 2016-01-24 | Disposition: A | Discharge status: Home / Self Care | Payer: Commercial Managed Care - HMO | Source: Ambulatory Visit | Attending: Nurse Practitioner | Admitting: Nurse Practitioner

## 2016-01-24 DIAGNOSIS — I480 Paroxysmal atrial fibrillation: Secondary | ICD-10-CM

## 2016-01-24 DIAGNOSIS — R000 Tachycardia, unspecified: Secondary | ICD-10-CM | POA: Diagnosis not present

## 2016-01-24 DIAGNOSIS — I441 Atrioventricular block, second degree: Secondary | ICD-10-CM | POA: Diagnosis not present

## 2016-01-24 DIAGNOSIS — I4891 Unspecified atrial fibrillation: Secondary | ICD-10-CM | POA: Diagnosis not present

## 2016-01-24 DIAGNOSIS — I4510 Unspecified right bundle-branch block: Secondary | ICD-10-CM | POA: Diagnosis not present

## 2016-01-24 LAB — CBC
HEMATOCRIT: 34.5 % — AB (ref 36.0–46.0)
HEMOGLOBIN: 10 g/dL — AB (ref 12.0–15.0)
MCH: 22.6 pg — ABNORMAL LOW (ref 26.0–34.0)
MCHC: 29 g/dL — ABNORMAL LOW (ref 30.0–36.0)
MCV: 77.9 fL — ABNORMAL LOW (ref 78.0–100.0)
Platelets: 329 10*3/uL (ref 150–400)
RBC: 4.43 MIL/uL (ref 3.87–5.11)
RDW: 15.9 % — AB (ref 11.5–15.5)
WBC: 7.5 10*3/uL (ref 4.0–10.5)

## 2016-01-24 NOTE — Patient Instructions (Signed)
Cardioversion scheduled for Tuesday, August 15th  - Arrive at the [facility] and go to admitting at [place] not eat or drink anything after midnight the night prior to your procedure.  - Take all your medication with a sip of water prior to arrival.  - You will not be able to drive home after your procedure.

## 2016-01-25 ENCOUNTER — Ambulatory Visit (HOSPITAL_COMMUNITY)
Admission: RE | Admit: 2016-01-25 | Discharge: 2016-01-25 | Disposition: A | Discharge status: Home / Self Care | Payer: Commercial Managed Care - HMO | Source: Ambulatory Visit | Attending: Cardiovascular Disease | Admitting: Cardiovascular Disease

## 2016-01-25 ENCOUNTER — Encounter (HOSPITAL_COMMUNITY): Payer: Self-pay | Admitting: *Deleted

## 2016-01-25 ENCOUNTER — Ambulatory Visit (HOSPITAL_COMMUNITY): Payer: Commercial Managed Care - HMO | Admitting: Anesthesiology

## 2016-01-25 ENCOUNTER — Encounter (HOSPITAL_COMMUNITY)
Admission: RE | Disposition: A | Discharge status: Home / Self Care | Payer: Self-pay | Source: Ambulatory Visit | Attending: Cardiovascular Disease

## 2016-01-25 DIAGNOSIS — Z7901 Long term (current) use of anticoagulants: Secondary | ICD-10-CM | POA: Diagnosis not present

## 2016-01-25 DIAGNOSIS — M797 Fibromyalgia: Secondary | ICD-10-CM | POA: Diagnosis not present

## 2016-01-25 DIAGNOSIS — M1990 Unspecified osteoarthritis, unspecified site: Secondary | ICD-10-CM | POA: Diagnosis not present

## 2016-01-25 DIAGNOSIS — I4891 Unspecified atrial fibrillation: Secondary | ICD-10-CM | POA: Diagnosis not present

## 2016-01-25 DIAGNOSIS — J45909 Unspecified asthma, uncomplicated: Secondary | ICD-10-CM | POA: Diagnosis not present

## 2016-01-25 DIAGNOSIS — I481 Persistent atrial fibrillation: Secondary | ICD-10-CM | POA: Insufficient documentation

## 2016-01-25 SURGERY — CANCELLED PROCEDURE
Anesthesia: Monitor Anesthesia Care

## 2016-01-25 MED ORDER — SODIUM CHLORIDE 0.9 % IV SOLN
INTRAVENOUS | Status: DC
  Administered 2016-01-25: 500 mL via INTRAVENOUS

## 2016-01-25 MED ORDER — PROPOFOL 10 MG/ML IV BOLUS
INTRAVENOUS | Status: DC | PRN
  Administered 2016-01-25: 80 mg via INTRAVENOUS

## 2016-01-25 MED ORDER — LIDOCAINE HCL (CARDIAC) 20 MG/ML IV SOLN
INTRAVENOUS | Status: DC | PRN
  Administered 2016-01-25: 60 mg via INTRAVENOUS

## 2016-01-25 MED ORDER — METOPROLOL SUCCINATE ER 25 MG PO TB24: 25.0000 mg | ORAL_TABLET | Freq: Every day | ORAL | 3 refills | Status: DC

## 2016-01-25 NOTE — Interval H&P Note (Signed)
History and Physical Interval Note:  01/25/2016 11:51 AM  Tristian A Imel  has presented today for surgery, with the diagnosis of AFIB  The various methods of treatment have been discussed with the patient and family. After consideration of risks, benefits and other options for treatment, the patient has consented to  Procedure(s): CARDIOVERSION (N/A) as a surgical intervention .  The patient's history has been reviewed, patient examined, no change in status, stable for surgery.  I have reviewed the patient's chart and labs.  Questions were answered to the patient's satisfaction.     Mertie Moores

## 2016-01-25 NOTE — CV Procedure (Signed)
    Cardioversion Note  Angela Cummings [id number] [birth year]  Procedure: DC Cardioversion Indications: atrial fib   Procedure Details Consent: Obtained Time Out: Verified patient identification, verified procedure, site/side was marked, verified correct patient position, special equipment/implants available, Radiology Safety Procedures followed,  medications/allergies/relevent history reviewed, required imaging and test results available.  Performed  The patient has been on adequate anticoagulation.  The patient received IV Propofol 80 mg  for sedation.    She converted after the Propofol and did not require electrical cardioversion  The cardioversion was successful.      Complications: No apparent complications Patient did tolerate procedure well.   [name], Brooke Bonito., MD, [facility] 01/25/2016, 12:00 PM

## 2016-01-25 NOTE — Transfer of Care (Signed)
Immediate Anesthesia Transfer of Care Note  Patient: Angela Cummings  Procedure(s) Performed: Procedure(s): CARDIOVERSION (N/A)  Patient Location: Endoscopy Unit  Anesthesia Type:General  Level of Consciousness: awake, alert , oriented and patient cooperative  Airway & Oxygen Therapy: Patient Spontanous Breathing and Patient connected to nasal cannula oxygen  Post-op Assessment: Report given to RN, Post -op Vital signs reviewed and stable and Patient moving all extremities  Post vital signs: Reviewed and stable  Last Vitals:  Vitals:   01/25/16 1059  BP: (!) 147/103  Resp: 20  Temp: 37.1 C    Last Pain:  Vitals:   01/25/16 1059  TempSrc: Oral         Complications: No apparent anesthesia complications

## 2016-01-25 NOTE — Progress Notes (Signed)
Pt was in atrial fib before her scheduled cardioversion  She received Propofol and converted on her own to NSR She did not require electrical cardioversion  Will add low dose toprol XL to her med list. Continue current dose of Diltiazem  Continue tikosyn   Follow up with Roderic Palau, NP     Mertie Moores, MD  01/25/2016 12:06 PM    [facility] [address],  [place], [place]  [address] Pager [phone] Phone: [phone]; Fax: [phone]

## 2016-01-25 NOTE — H&P (View-Only) (Signed)
Patient ID: Angela Cummings, female   DOB: Oct 31, 1955, 61 y.o.   MRN: [id number]     Primary Care Physician: Reginia Forts, MD Referring Physician: [name] f/u Cardiologist: Dr. [name] is a 60 y.o. female with a h/o persistent afib recently failing Rythmol and admitted for tikosyn load 1/10 through 1/13. She required dose adjustment to 250 mg bid due to prolonged qtc. Labs were stable. She returned  1/20 saying that she felt better and heart is staying in rhythm other than 20 mins of afib that resolved after taking her am medicine. Qtc stable.   2/17- She returns to afib clinic for [place] f/u. She had a bad day on Tuesday, feels like she may have been in afib. She is in SR today. Otherwise no complaints.   Return 5/12- in afib clinic for f/u tikosyn. She feels well, no awareness of afib. Taking tikosyn and blood thinner on a regular basis. No bleeding issues.  F/u in afib clinic- 8/11- Pt is in afib today with rvr. States needs shoulder surgery due to rotator cuff problems, and is under stress raising her great granddaughter. She feels as though she has been having more afib later. Is taking her tikosyn on a regular basis.  Today, she denies symptoms of palpitations, chest pain, shortness of breath, orthopnea, PND, lower extremity edema, dizziness, presyncope, syncope, or neurologic sequela. The patient is tolerating medications without difficulties and is otherwise without complaint today.   Past Medical History:  Diagnosis Date  . Anxiety   . Asthma   . Atrial fibrillation ([place]) 11/2010   paroxysmal  . Bradycardia   . Depression   . DJD (degenerative joint disease)    Cervical spine  . Fibromyalgia    s/p rheumatology consultation/[facility]  . Mild sleep apnea   . Obesity   . Osteoarthritis    Shoulders, hips,knees   Past Surgical History:  Procedure Laterality Date  . ABLATION  08/2012   PVI by Dr [name]  . ABLATION  11/13/2013   PVI by Dr [name]  . ATRIAL  FIBRILLATION ABLATION Angela Cummings 09/03/2012   Procedure: ATRIAL FIBRILLATION ABLATION;  Surgeon: Thompson Grayer, MD;  Location: [facility] CATH LAB;  Service: Cardiovascular;  Laterality: Angela Cummings;  . ATRIAL FIBRILLATION ABLATION Angela Cummings 11/13/2013   Procedure: ATRIAL FIBRILLATION ABLATION;  Surgeon: Coralyn Mark, MD;  Location: [place] CATH LAB;  Service: Cardiovascular;  Laterality: Angela Cummings;  . CARDIOVERSION  03/13/2012   Procedure: CARDIOVERSION;  Surgeon: [name], MD;  Location: [facility] ENDOSCOPY;  Service: Cardiovascular;  Laterality: Angela Cummings;  . CARDIOVERSION Angela Cummings 07/26/2012   Procedure: CARDIOVERSION;  Surgeon: Thompson Grayer, MD;  Location: [place];  Service: Cardiovascular;  Laterality: Angela Cummings;  . CARDIOVERSION Angela Cummings 02/20/2014   Procedure: CARDIOVERSION;  Surgeon: Larey Dresser, MD;  Location: [place];  Service: Cardiovascular;  Laterality: Angela Cummings;  . CESAREAN SECTION    . CHOLECYSTECTOMY    . HERNIA REPAIR    . KNEE SURGERY    . neck tumor resection     benign  . TEE WITHOUT CARDIOVERSION  03/13/2012   Procedure: TRANSESOPHAGEAL ECHOCARDIOGRAM (TEE);  Surgeon: [name], MD;  Location: [place];  Service: Cardiovascular;  Laterality: Angela Cummings;  Rm 2029  . TEE WITHOUT CARDIOVERSION Angela Cummings 09/02/2012   Procedure: TRANSESOPHAGEAL ECHOCARDIOGRAM (TEE);  Surgeon: Josue Hector, MD;  Location: [place];  Service: Cardiovascular;  Laterality: Angela Cummings;  . TEE WITHOUT CARDIOVERSION Angela Cummings 11/13/2013   Procedure: TRANSESOPHAGEAL ECHOCARDIOGRAM (TEE);  Surgeon: Wonda Cheng Nahser,  MD;  Location: [place] ENDOSCOPY;  Service: Cardiovascular;  Laterality: Angela Cummings;    Current Outpatient Prescriptions  Medication Sig Dispense Refill  . albuterol (PROVENTIL HFA;VENTOLIN HFA) 108 (90 Base) MCG/ACT inhaler Inhale 2 puffs into the lungs every 6 (six) hours as needed. 1 Inhaler 1  . diclofenac sodium (VOLTAREN) 1 % GEL Apply 4 g topically 4 (four) times daily. 100 g 3  . Diclofenac-Misoprostol 75-0.2 MG TBEC TAKE 1 TABLET BY MOUTH TWICE DAILY 60 tablet 0  .  diltiazem (CARDIZEM CD) 240 MG 24 hr capsule Take 1 capsule (240 mg total) by mouth daily. 90 capsule 3  . diltiazem (CARDIZEM) 30 MG tablet Take 1 tablet (30 mg total) by mouth daily as needed. (Patient taking differently: Take 30 mg by mouth daily as needed (afib). ) 30 tablet 5  . dofetilide (TIKOSYN) 250 MCG capsule Take 1 capsule (250 mcg total) by mouth 2 (two) times daily. 180 capsule 3  . FLUoxetine (PROZAC) 20 MG tablet Take 1 tablet (20 mg total) by mouth daily. 30 tablet 5  . pantoprazole (PROTONIX) 40 MG tablet Take 1 tablet (40 mg total) by mouth daily. 30 tablet 3  . potassium chloride SA (K-DUR,KLOR-CON) 20 MEQ tablet Take 2 tablets (40 mEq total) by mouth 2 (two) times daily. 120 tablet 6  . rivaroxaban (XARELTO) 20 MG TABS tablet Take 1 tablet (20 mg total) by mouth daily with supper. 90 tablet 3   No current facility-administered medications for this encounter.     Allergies  Allergen Reactions  . Adhesive [Tape] Itching and Rash    Paper tape is better but best to use no adhesive if possible    Social History   Social History  . Marital status: Widowed    Spouse name: Angela Cummings  . Number of children: 3  . Years of education: Angela Cummings   Occupational History  . Not on file.   Social History Main Topics  . Smoking status: Never Smoker  . Smokeless tobacco: Never Used  . Alcohol use No     Comment: Rarely  . Drug use: No  . Sexual activity: No   Other Topics Concern  . Not on file   Social History Narrative   Marital status: widowed since 01/2014 of melanoma; not dating.       Children:  3 children (17,30, 32) and 4 grandchildren and 1 gg. 1 adopted child.       Lives with son.  Lives in [place].        Employment:  Unemployed.       Tobacco: none      Alcohol: none      Drugs: none      Exercise: none    Family History  Problem Relation Age of Onset  . Diabetes Other   . Thyroid disease Other   . Emphysema Mother   . COPD Mother   . Anxiety disorder  Mother   . Cancer Father     lung cancer; non-smoker  . Heart disease Father 40    AMI  . COPD Sister   . Emphysema Brother   . Cancer Daughter   . Cancer Sister     lung cancer  . Heart disease Sister     AMI    ROS- All systems are reviewed and negative except as per the HPI above  Physical Exam: Vitals:   01/21/16 0909  BP: 132/76  Pulse: (!) 128  Weight: 206 lb 12.8 oz (93.8 kg)  Height:  5' 0.5" (1.537 m)    GEN- The patient is well appearing, alert and oriented x 3 today.   Head- normocephalic, atraumatic Eyes-  Sclera clear, conjunctiva pink Ears- hearing intact Oropharynx- clear Neck- supple, no JVP Lymph- no cervical lymphadenopathy Lungs- Clear to ausculation bilaterally, normal work of breathing Heart- Regular rate and rhythm, no murmurs, rubs or gallops, PMI not laterally displaced GI- soft, NT, ND, + BS Extremities- no clubbing, cyanosis, or edema MS- no significant deformity or atrophy Skin- no rash or lesion Psych- euthymic mood, full affect Neuro- strength and sensation are intact  EKG- SR with PAC's, IRBBB, pr int 166 ms, qrs int 106 ms, qtc 466 ms Epic records reviewed  Assessment and Plan: 1. PAF Has been staying in SR, but she is in afib today with rvr Encouraged to take 30 mg Cardizem as needed for rate control No missed doses of Tikosyn Continue eliquis  Bmet/mag today   F/u in afib clinic on Monday, if remains in afib over the weekend, will plan for cardioversion   Butch Penny C. [facility] [address] [place], [place] [address] [phone]     8/14- Pt returns for repeat EKG and shows persistent afib at 152 bpm. She has been in afib over the weekend. States no missed doses of xarelto and no missed doses of tikosyn. Will arrange for cardioversion tomorrow at 11am. Labs drawn.  Geroge Baseman Vlada Uriostegui, [facility] [address] [place], [place]  [address] [phone]

## 2016-01-25 NOTE — Anesthesia Preprocedure Evaluation (Addendum)
Anesthesia Evaluation  Patient identified by MRN, date of birth, ID band Patient awake    Reviewed: Allergy & Precautions, H&P , NPO status , Patient's Chart, lab work & pertinent test results  History of Anesthesia Complications Negative for: history of anesthetic complications  Airway Mallampati: II  TM Distance: >3 FB Neck ROM: Full    Dental  (+) Dental Advisory Given, Edentulous Upper   Pulmonary asthma ,    breath sounds clear to auscultation       Cardiovascular (-) hypertension(-) angina(-) Past MI and (-) CHF + dysrhythmias Atrial Fibrillation  Rhythm:Irregular Rate:Normal     Neuro/Psych PSYCHIATRIC DISORDERS negative neurological ROS     GI/Hepatic Neg liver ROS, GERD  Medicated and Controlled,  Endo/Other  Morbid obesity  Renal/GU      Musculoskeletal  (+) Arthritis , Osteoarthritis,    Abdominal (+) + obese,   Peds  Hematology negative hematology ROS (+)   Anesthesia Other Findings   Reproductive/Obstetrics negative OB ROS                             Anesthesia Physical  Anesthesia Plan  ASA: III  Anesthesia Plan: MAC   Post-op Pain Management:    Induction: Intravenous  Airway Management Planned: Natural Airway  Additional Equipment: None  Intra-op Plan:   Post-operative Plan:   Informed Consent: I have reviewed the patients History and Physical, chart, labs and discussed the procedure including the risks, benefits and alternatives for the proposed anesthesia with the patient or authorized representative who has indicated his/her understanding and acceptance.   Dental advisory given  Plan Discussed with: CRNA and Surgeon  Anesthesia Plan Comments:        Anesthesia Quick Evaluation

## 2016-01-26 NOTE — Anesthesia Postprocedure Evaluation (Signed)
Anesthesia Post Note  Patient: Angela Cummings  Procedure(s) Performed: Procedure(s) (LRB): CARDIOVERSION (N/A)  Patient location during evaluation: PACU Anesthesia Type: General Level of consciousness: awake and alert and patient cooperative Pain management: pain level controlled Vital Signs Assessment: post-procedure vital signs reviewed and stable Respiratory status: spontaneous breathing and respiratory function stable Cardiovascular status: stable Anesthetic complications: no    Last Vitals:  Vitals:   01/25/16 1215 01/25/16 1220  BP: 138/79 (!) 129/54  Pulse: 65 71  Resp: 14 17  Temp:      Last Pain:  Vitals:   01/25/16 1206  TempSrc: Oral                 Jazen Spraggins S

## 2016-01-29 ENCOUNTER — Other Ambulatory Visit: Payer: Self-pay | Admitting: Physician Assistant

## 2016-01-29 DIAGNOSIS — F329 Major depressive disorder, single episode, unspecified: Secondary | ICD-10-CM

## 2016-01-29 DIAGNOSIS — F32A Depression, unspecified: Secondary | ICD-10-CM

## 2016-02-04 ENCOUNTER — Ambulatory Visit (HOSPITAL_COMMUNITY)
Admission: RE | Admit: 2016-02-04 | Discharge: 2016-02-04 | Disposition: A | Discharge status: Home / Self Care | Payer: Commercial Managed Care - HMO | Source: Ambulatory Visit | Attending: Nurse Practitioner | Admitting: Nurse Practitioner

## 2016-02-04 ENCOUNTER — Encounter (HOSPITAL_COMMUNITY): Payer: Self-pay | Admitting: Nurse Practitioner

## 2016-02-04 VITALS — BP 116/62 | HR 56 | Ht 60.5 in | Wt 215.2 lb

## 2016-02-04 DIAGNOSIS — Z8249 Family history of ischemic heart disease and other diseases of the circulatory system: Secondary | ICD-10-CM | POA: Diagnosis not present

## 2016-02-04 DIAGNOSIS — Z7901 Long term (current) use of anticoagulants: Secondary | ICD-10-CM | POA: Insufficient documentation

## 2016-02-04 DIAGNOSIS — M797 Fibromyalgia: Secondary | ICD-10-CM | POA: Insufficient documentation

## 2016-02-04 DIAGNOSIS — Z825 Family history of asthma and other chronic lower respiratory diseases: Secondary | ICD-10-CM | POA: Insufficient documentation

## 2016-02-04 DIAGNOSIS — Z6841 Body Mass Index (BMI) 40.0 and over, adult: Secondary | ICD-10-CM | POA: Insufficient documentation

## 2016-02-04 DIAGNOSIS — M1990 Unspecified osteoarthritis, unspecified site: Secondary | ICD-10-CM | POA: Diagnosis not present

## 2016-02-04 DIAGNOSIS — I481 Persistent atrial fibrillation: Secondary | ICD-10-CM

## 2016-02-04 DIAGNOSIS — Z79899 Other long term (current) drug therapy: Secondary | ICD-10-CM | POA: Diagnosis not present

## 2016-02-04 DIAGNOSIS — G4730 Sleep apnea, unspecified: Secondary | ICD-10-CM | POA: Insufficient documentation

## 2016-02-04 DIAGNOSIS — J45909 Unspecified asthma, uncomplicated: Secondary | ICD-10-CM | POA: Insufficient documentation

## 2016-02-04 DIAGNOSIS — E669 Obesity, unspecified: Secondary | ICD-10-CM | POA: Insufficient documentation

## 2016-02-04 DIAGNOSIS — I480 Paroxysmal atrial fibrillation: Secondary | ICD-10-CM | POA: Diagnosis not present

## 2016-02-04 DIAGNOSIS — F329 Major depressive disorder, single episode, unspecified: Secondary | ICD-10-CM | POA: Diagnosis not present

## 2016-02-04 DIAGNOSIS — F419 Anxiety disorder, unspecified: Secondary | ICD-10-CM | POA: Diagnosis not present

## 2016-02-04 DIAGNOSIS — I4819 Other persistent atrial fibrillation: Secondary | ICD-10-CM

## 2016-02-04 NOTE — Progress Notes (Signed)
Patient ID: Angela Cummings, female   DOB: Oct 18, 1955, 60 y.o.   MRN: [id number]     Primary Care Physician: Reginia Forts, MD Referring Physician: [facility] f/u Cardiologist: Dr. [name] is a 60 y.o. female with a h/o persistent afib recently failing Rythmol and admitted for tikosyn load 1/10 through 1/13. She required dose adjustment to 250 mg bid due to prolonged qtc. Labs were stable. She returned  1/20 saying that she felt better and heart is staying in rhythm other than 20 mins of afib that resolved after taking her am medicine. Qtc stable.   2/17- She returns to afib clinic for [place] f/u. She had a bad day on Tuesday, feels like she may have been in afib. She is in SR today. Otherwise no complaints.   Return 5/12- in afib clinic for f/u tikosyn. She feels well, no awareness of afib. Taking tikosyn and blood thinner on a regular basis. No bleeding issues.  F/u in afib clinic- 8/11- Pt is in afib today with rvr. States needs shoulder surgery due to rotator cuff problems, and is under stress raising her great granddaughter. She feels as though she has been having more afib later. Is taking her tikosyn on a regular basis.  F/u planned cardioversion today, 8/24. She did not require cardioversion as she converted as soon as she was sedated. She has been staying in  [place] since then. She is going to have shoulder surgery the first part of September and preferably will only stop  DOAC one day prior to surgery and resume as soon as  possible after surgery. Reviewed with Dr. [name] and she does not need to follow the rule of no interruption of blood thinner x 30 days after cardioversion since she converted on her own.  Today, she denies symptoms of palpitations, chest pain, shortness of breath, orthopnea, PND, lower extremity edema, dizziness, presyncope, syncope, or neurologic sequela. The patient is tolerating medications without difficulties and is otherwise without complaint today.     Past Medical History:  Diagnosis Date  . Anxiety   . Asthma   . Atrial fibrillation ([place]) 11/2010   paroxysmal  . Bradycardia   . Depression   . DJD (degenerative joint disease)    Cervical spine  . Fibromyalgia    s/p rheumatology consultation/Zem[place]  . Mild sleep apnea   . Obesity   . Osteoarthritis    Shoulders, hips,knees   Past Surgical History:  Procedure Laterality Date  . ABLATION  08/2012   PVI by Dr [name]  . ABLATION  11/13/2013   PVI by Dr [name]  . ATRIAL FIBRILLATION ABLATION N/A 09/03/2012   Procedure: ATRIAL FIBRILLATION ABLATION;  Surgeon: Thompson Grayer, MD;  Location: [facility] CATH LAB;  Service: Cardiovascular;  Laterality: N/A;  . ATRIAL FIBRILLATION ABLATION N/A 11/13/2013   Procedure: ATRIAL FIBRILLATION ABLATION;  Surgeon: Coralyn Mark, MD;  Location: [facility] CATH LAB;  Service: Cardiovascular;  Laterality: N/A;  . CARDIOVERSION  03/13/2012   Procedure: CARDIOVERSION;  Surgeon: [name], MD;  Location: [facility] ENDOSCOPY;  Service: Cardiovascular;  Laterality: N/A;  . CARDIOVERSION N/A 07/26/2012   Procedure: CARDIOVERSION;  Surgeon: Thompson Grayer, MD;  Location: [place];  Service: Cardiovascular;  Laterality: N/A;  . CARDIOVERSION N/A 02/20/2014   Procedure: CARDIOVERSION;  Surgeon: Larey Dresser, MD;  Location: [place];  Service: Cardiovascular;  Laterality: N/A;  . CESAREAN SECTION    . CHOLECYSTECTOMY    . HERNIA REPAIR    .  KNEE SURGERY    . neck tumor resection     benign  . TEE WITHOUT CARDIOVERSION  03/13/2012   Procedure: TRANSESOPHAGEAL ECHOCARDIOGRAM (TEE);  Surgeon: [name], MD;  Location: [place];  Service: Cardiovascular;  Laterality: N/A;  Rm 2029  . TEE WITHOUT CARDIOVERSION N/A 09/02/2012   Procedure: TRANSESOPHAGEAL ECHOCARDIOGRAM (TEE);  Surgeon: Josue Hector, MD;  Location: [place];  Service: Cardiovascular;  Laterality: N/A;  . TEE WITHOUT CARDIOVERSION N/A 11/13/2013   Procedure: TRANSESOPHAGEAL ECHOCARDIOGRAM (TEE);   Surgeon: [name], MD;  Location: [facility] ENDOSCOPY;  Service: Cardiovascular;  Laterality: N/A;    Current Outpatient Prescriptions  Medication Sig Dispense Refill  . albuterol (PROVENTIL HFA;VENTOLIN HFA) 108 (90 Base) MCG/ACT inhaler Inhale 2 puffs into the lungs every 6 (six) hours as needed. (Patient taking differently: Inhale 2 puffs into the lungs every 6 (six) hours as needed for wheezing. ) 1 Inhaler 1  . diclofenac sodium (VOLTAREN) 1 % GEL Apply 4 g topically 4 (four) times daily. 100 g 3  . Diclofenac-Misoprostol 75-0.2 MG TBEC TAKE 1 TABLET BY MOUTH TWICE DAILY 60 tablet 0  . diltiazem (CARDIZEM CD) 240 MG 24 hr capsule Take 1 capsule (240 mg total) by mouth daily. 90 capsule 3  . diltiazem (CARDIZEM) 30 MG tablet Take 1 tablet (30 mg total) by mouth daily as needed. (Patient taking differently: Take 30 mg by mouth daily as needed (afib). ) 30 tablet 5  . dofetilide (TIKOSYN) 250 MCG capsule Take 1 capsule (250 mcg total) by mouth 2 (two) times daily. 180 capsule 3  . FLUoxetine (PROZAC) 20 MG tablet Take 1 tablet (20 mg total) by mouth daily. 30 tablet 5  . metoprolol succinate (TOPROL XL) 25 MG 24 hr tablet Take 1 tablet (25 mg total) by mouth daily. 30 tablet 3  . pantoprazole (PROTONIX) 40 MG tablet Take 1 tablet (40 mg total) by mouth daily. 30 tablet 3  . potassium chloride SA (K-DUR,KLOR-CON) 20 MEQ tablet Take 2 tablets (40 mEq total) by mouth 2 (two) times daily. 120 tablet 6  . rivaroxaban (XARELTO) 20 MG TABS tablet Take 1 tablet (20 mg total) by mouth daily with supper. 90 tablet 3   No current facility-administered medications for this encounter.     Allergies  Allergen Reactions  . Adhesive [Tape] Itching and Rash    Paper tape is better but best to use no adhesive if possible    Social History   Social History  . Marital status: Widowed    Spouse name: N/A  . Number of children: 3  . Years of education: N/A   Occupational History  . Not on file.    Social History Main Topics  . Smoking status: Never Smoker  . Smokeless tobacco: Never Used  . Alcohol use No     Comment: Rarely  . Drug use: No  . Sexual activity: No   Other Topics Concern  . Not on file   Social History Narrative   Marital status: widowed since 01/2014 of melanoma; not dating.       Children:  3 children (17,30, 32) and 4 grandchildren and 1 gg. 1 adopted child.       Lives with son.  Lives in [place].        Employment:  Unemployed.       Tobacco: none      Alcohol: none      Drugs: none      Exercise: none  Family History  Problem Relation Age of Onset  . Emphysema Mother   . COPD Mother   . Anxiety disorder Mother   . Cancer Father     lung cancer; non-smoker  . Heart disease Father 76    AMI  . COPD Sister   . Emphysema Brother   . Cancer Daughter   . Cancer Sister     lung cancer  . Heart disease Sister     AMI  . Diabetes Other   . Thyroid disease Other     ROS- All systems are reviewed and negative except as per the HPI above  Physical Exam: Vitals:   02/04/16 1203  BP: 116/62  Pulse: (!) 56  Weight: 215 lb 3.2 oz (97.6 kg)  Height: 5' 0.5" (1.537 m)    GEN- The patient is well appearing, alert and oriented x 3 today.   Head- normocephalic, atraumatic Eyes-  Sclera clear, conjunctiva pink Ears- hearing intact Oropharynx- clear Neck- supple, no JVP Lymph- no cervical lymphadenopathy Lungs- Clear to ausculation bilaterally, normal work of breathing Heart- Regular rate and rhythm, no murmurs, rubs or gallops, PMI not laterally displaced GI- soft, NT, ND, + BS Extremities- no clubbing, cyanosis, or edema MS- no significant deformity or atrophy Skin- no rash or lesion Psych- euthymic mood, full affect Neuro- strength and sensation are intact  EKG- S brady at 56 bpm, IRBBB, pr int 174 ms, qrs int 96 ms, qtc 411ms Epic records reviewed  Assessment and Plan: 1. PAF Has resumed SR Encouraged to take 30 mg Cardizem  as needed for rate control No missed doses of Tikosyn Continue eliquis and per Dr. [name] he would prefer her to be off eliquis just one day prior to shoulder surgery and resume as quickly as possible after surgery.  F/u in afib clinic 3 months  Geroge Baseman. Everlena Mackley, [facility] [address] [place], [place] [address] [phone]

## 2016-02-14 ENCOUNTER — Other Ambulatory Visit: Payer: Self-pay | Admitting: Family Medicine

## 2016-02-17 DIAGNOSIS — S46009A Unspecified injury of muscle(s) and tendon(s) of the rotator cuff of unspecified shoulder, initial encounter: Secondary | ICD-10-CM | POA: Insufficient documentation

## 2016-02-18 ENCOUNTER — Encounter: Payer: Self-pay | Admitting: Family Medicine

## 2016-02-18 ENCOUNTER — Other Ambulatory Visit: Payer: Self-pay | Admitting: Family Medicine

## 2016-02-18 ENCOUNTER — Telehealth: Payer: Self-pay | Admitting: Emergency Medicine

## 2016-02-18 DIAGNOSIS — D6489 Other specified anemias: Secondary | ICD-10-CM

## 2016-02-18 NOTE — Telephone Encounter (Signed)
-----   Message from Wardell Honour, MD sent at 02/18/2016  3:09 PM EDT ----- 1.  No evidence of hepatitis C or HIV.  2. Anemic on labs with hemoglobin of 10.0 which is lower than usual.  Repeat hemoglobin during heart procedure in August was stable. Have you ever undergone a colonoscopy? If so, who performed it? I would recommend returning this month for a lab visit to repeat hemoglobin level.  I have placed an order for labs for you to return to the office at your convenience. 3. Hemoglobin A1c is still in the PREDIABETIC range.  I recommend weight loss, exercise, and low-sugar low-carbohydrate food choices.

## 2016-05-09 ENCOUNTER — Encounter (HOSPITAL_COMMUNITY): Payer: Self-pay | Admitting: Nurse Practitioner

## 2016-05-09 ENCOUNTER — Ambulatory Visit (HOSPITAL_COMMUNITY)
Admission: RE | Admit: 2016-05-09 | Discharge: 2016-05-09 | Disposition: A | Discharge status: Home / Self Care | Payer: Commercial Managed Care - HMO | Source: Ambulatory Visit | Attending: Nurse Practitioner | Admitting: Nurse Practitioner

## 2016-05-09 VITALS — BP 136/72 | HR 59 | Ht 60.5 in | Wt 213.4 lb

## 2016-05-09 DIAGNOSIS — Z5189 Encounter for other specified aftercare: Secondary | ICD-10-CM | POA: Diagnosis present

## 2016-05-09 DIAGNOSIS — I480 Paroxysmal atrial fibrillation: Secondary | ICD-10-CM | POA: Diagnosis not present

## 2016-05-09 DIAGNOSIS — I481 Persistent atrial fibrillation: Secondary | ICD-10-CM

## 2016-05-09 DIAGNOSIS — Z7901 Long term (current) use of anticoagulants: Secondary | ICD-10-CM | POA: Insufficient documentation

## 2016-05-09 DIAGNOSIS — Z9889 Other specified postprocedural states: Secondary | ICD-10-CM | POA: Diagnosis not present

## 2016-05-09 DIAGNOSIS — I4819 Other persistent atrial fibrillation: Secondary | ICD-10-CM

## 2016-05-09 DIAGNOSIS — Z9049 Acquired absence of other specified parts of digestive tract: Secondary | ICD-10-CM | POA: Diagnosis not present

## 2016-05-09 DIAGNOSIS — Z79899 Other long term (current) drug therapy: Secondary | ICD-10-CM | POA: Diagnosis not present

## 2016-05-09 LAB — BASIC METABOLIC PANEL
Anion gap: 8 (ref 5–15)
BUN: 12 mg/dL (ref 6–20)
CALCIUM: 9.1 mg/dL (ref 8.9–10.3)
CHLORIDE: 106 mmol/L (ref 101–111)
CO2: 24 mmol/L (ref 22–32)
CREATININE: 0.79 mg/dL (ref 0.44–1.00)
GFR calc Af Amer: >60 mL/min (ref >60)
GFR calc non Af Amer: >60 mL/min (ref >60)
GLUCOSE: 86 mg/dL (ref 65–99)
Potassium: 4.2 mmol/L (ref 3.5–5.1)
Sodium: 138 mmol/L (ref 135–145)

## 2016-05-09 LAB — MAGNESIUM: Magnesium: 2.1 mg/dL (ref 1.7–2.4)

## 2016-05-09 NOTE — Progress Notes (Signed)
Patient ID: Angela Cummings, female   DOB: 08/13/1955, 60 y.o.   MRN: [id number]     Primary Care Physician: Reginia Forts, MD Referring Physician: [facility] f/u Cardiologist: Dr. [name] is a 60 y.o. female with a h/o persistent afib recently failing Rythmol and admitted for tikosyn load 1/10 through 1/13. She required dose adjustment to 250 mg bid due to prolonged qtc. Labs were stable. She returned  1/20 saying that she felt better and heart is staying in rhythm other than 20 mins of afib that resolved after taking her am medicine. Qtc stable.   2/17- She returns to afib clinic for [place] f/u. She had a bad day on Tuesday, feels like she may have been in afib. She is in SR today. Otherwise no complaints.   Return 5/12- in afib clinic for f/u tikosyn. She feels well, no awareness of afib. Taking tikosyn and blood thinner on a regular basis. No bleeding issues.  F/u in afib clinic- 8/11- Pt is in afib today with rvr. States needs shoulder surgery due to rotator cuff problems, and is under stress raising her great granddaughter. She feels as though she has been having more afib later. Is taking her tikosyn on a regular basis.  In afib clinic for f/u today. She had a successful cardioversion in August. She has been staying in [place]. Continues on tikosyn.No issues with DOAC. Qtc stable.  Today, she denies symptoms of palpitations, chest pain, shortness of breath, orthopnea, PND, lower extremity edema, dizziness, presyncope, syncope, or neurologic sequela. The patient is tolerating medications without difficulties and is otherwise without complaint today.   Past Medical History:  Diagnosis Date  . Anxiety   . Asthma   . Atrial fibrillation ([place]) 11/2010   paroxysmal  . Bradycardia   . Depression   . DJD (degenerative joint disease)    Cervical spine  . Fibromyalgia    s/p rheumatology consultation/[place]  . Mild sleep apnea   . Obesity   . Osteoarthritis    Shoulders,  hips,knees   Past Surgical History:  Procedure Laterality Date  . ABLATION  08/2012   PVI by Dr [name]  . ABLATION  11/13/2013   PVI by Dr [name]  . ATRIAL FIBRILLATION ABLATION N/A 09/03/2012   Procedure: ATRIAL FIBRILLATION ABLATION;  Surgeon: Thompson Grayer, MD;  Location: [facility] CATH LAB;  Service: Cardiovascular;  Laterality: N/A;  . ATRIAL FIBRILLATION ABLATION N/A 11/13/2013   Procedure: ATRIAL FIBRILLATION ABLATION;  Surgeon: Coralyn Mark, MD;  Location: [place] CATH LAB;  Service: Cardiovascular;  Laterality: N/A;  . CARDIOVERSION  03/13/2012   Procedure: CARDIOVERSION;  Surgeon: [name], MD;  Location: [facility] ENDOSCOPY;  Service: Cardiovascular;  Laterality: N/A;  . CARDIOVERSION N/A 07/26/2012   Procedure: CARDIOVERSION;  Surgeon: Thompson Grayer, MD;  Location: [place];  Service: Cardiovascular;  Laterality: N/A;  . CARDIOVERSION N/A 02/20/2014   Procedure: CARDIOVERSION;  Surgeon: Larey Dresser, MD;  Location: [place];  Service: Cardiovascular;  Laterality: N/A;  . CESAREAN SECTION    . CHOLECYSTECTOMY    . HERNIA REPAIR    . KNEE SURGERY    . neck tumor resection     benign  . TEE WITHOUT CARDIOVERSION  03/13/2012   Procedure: TRANSESOPHAGEAL ECHOCARDIOGRAM (TEE);  Surgeon: [name], MD;  Location: [place];  Service: Cardiovascular;  Laterality: N/A;  Rm 2029  . TEE WITHOUT CARDIOVERSION N/A 09/02/2012   Procedure: TRANSESOPHAGEAL ECHOCARDIOGRAM (TEE);  Surgeon: Josue Hector, MD;  Location: [facility] ENDOSCOPY;  Service: Cardiovascular;  Laterality: N/A;  . TEE WITHOUT CARDIOVERSION N/A 11/13/2013   Procedure: TRANSESOPHAGEAL ECHOCARDIOGRAM (TEE);  Surgeon: [name], MD;  Location: [facility] ENDOSCOPY;  Service: Cardiovascular;  Laterality: N/A;    Current Outpatient Prescriptions  Medication Sig Dispense Refill  . diclofenac sodium (VOLTAREN) 1 % GEL Apply 4 g topically 4 (four) times daily. 100 g 3  . Diclofenac-Misoprostol 75-0.2 MG TBEC TAKE 1 TABLET BY MOUTH TWICE DAILY  60 tablet 0  . diltiazem (CARDIZEM CD) 240 MG 24 hr capsule Take 1 capsule (240 mg total) by mouth daily. 90 capsule 3  . diltiazem (CARDIZEM) 30 MG tablet Take 1 tablet (30 mg total) by mouth daily as needed. (Patient taking differently: Take 30 mg by mouth daily as needed (afib). ) 30 tablet 5  . dofetilide (TIKOSYN) 250 MCG capsule Take 1 capsule (250 mcg total) by mouth 2 (two) times daily. 180 capsule 3  . FLUoxetine (PROZAC) 20 MG tablet Take 1 tablet (20 mg total) by mouth daily. 30 tablet 5  . metoprolol succinate (TOPROL XL) 25 MG 24 hr tablet Take 1 tablet (25 mg total) by mouth daily. 30 tablet 3  . pantoprazole (PROTONIX) 40 MG tablet Take 1 tablet (40 mg total) by mouth daily. 30 tablet 3  . potassium chloride SA (K-DUR,KLOR-CON) 20 MEQ tablet Take 2 tablets (40 mEq total) by mouth 2 (two) times daily. 120 tablet 6  . PROVENTIL HFA 108 (90 Base) MCG/ACT inhaler INHALE 2 PUFFS INTO THE LUNGS EVERY 6 HOURS AS NEEDED 6.7 g 0  . rivaroxaban (XARELTO) 20 MG TABS tablet Take 1 tablet (20 mg total) by mouth daily with supper. 90 tablet 3   No current facility-administered medications for this encounter.     Allergies  Allergen Reactions  . Adhesive [Tape] Itching and Rash    Paper tape is better but best to use no adhesive if possible    Social History   Social History  . Marital status: Widowed    Spouse name: N/A  . Number of children: 3  . Years of education: N/A   Occupational History  . Not on file.   Social History Main Topics  . Smoking status: Never Smoker  . Smokeless tobacco: Never Used  . Alcohol use No     Comment: Rarely  . Drug use: No  . Sexual activity: No   Other Topics Concern  . Not on file   Social History Narrative   Marital status: widowed since 01/2014 of melanoma; not dating.       Children:  3 children (17,30, 32) and 4 grandchildren and 1 gg. 1 adopted child.       Lives with son.  Lives in [place].        Employment:  Unemployed.        Tobacco: none      Alcohol: none      Drugs: none      Exercise: none    Family History  Problem Relation Age of Onset  . Emphysema Mother   . COPD Mother   . Anxiety disorder Mother   . Cancer Father     lung cancer; non-smoker  . Heart disease Father 80    AMI  . COPD Sister   . Emphysema Brother   . Cancer Daughter   . Cancer Sister     lung cancer  . Heart disease Sister     AMI  . Diabetes Other   .  Thyroid disease Other     ROS- All systems are reviewed and negative except as per the HPI above  Physical Exam: Vitals:   05/09/16 1102  BP: 136/72  Pulse: (!) 59  Weight: 213 lb 6.4 oz (96.8 kg)  Height: 5' 0.5" (1.537 m)    GEN- The patient is well appearing, alert and oriented x 3 today.   Head- normocephalic, atraumatic Eyes-  Sclera clear, conjunctiva pink Ears- hearing intact Oropharynx- clear Neck- supple, no JVP Lymph- no cervical lymphadenopathy Lungs- Clear to ausculation bilaterally, normal work of breathing Heart- Regular rate and rhythm, no murmurs, rubs or gallops, PMI not laterally displaced GI- soft, NT, ND, + BS Extremities- no clubbing, cyanosis, or edema MS- no significant deformity or atrophy Skin- no rash or lesion Psych- euthymic mood, full affect Neuro- strength and sensation are intact  EKG- S brady at 59 bpm, pr int 174 ms, qrs int 98 ms   Assessment and Plan: 1. PAF Has been staying in SR  30 mg Cardizem as needed for rate control ContinueTikosyn Continue eliquis  Bmet/mag today   F/u in afib clinic in 3 months   Butch Penny C. Carroll, [facility] [address] [place], [place] [address] [phone]

## 2016-06-07 ENCOUNTER — Other Ambulatory Visit (HOSPITAL_COMMUNITY): Payer: Self-pay | Admitting: *Deleted

## 2016-06-07 MED ORDER — METOPROLOL SUCCINATE ER 25 MG PO TB24: 25.0000 mg | ORAL_TABLET | Freq: Every day | ORAL | 6 refills | Status: DC

## 2016-06-15 ENCOUNTER — Encounter (HOSPITAL_COMMUNITY): Payer: Self-pay | Admitting: Nurse Practitioner

## 2016-06-15 ENCOUNTER — Ambulatory Visit (HOSPITAL_COMMUNITY)
Admission: RE | Admit: 2016-06-15 | Discharge: 2016-06-15 | Disposition: A | Discharge status: Home / Self Care | Payer: Commercial Managed Care - HMO | Source: Ambulatory Visit | Attending: Nurse Practitioner | Admitting: Nurse Practitioner

## 2016-06-15 VITALS — BP 92/60 | HR 51 | Ht 60.5 in | Wt 211.8 lb

## 2016-06-15 DIAGNOSIS — J45909 Unspecified asthma, uncomplicated: Secondary | ICD-10-CM | POA: Insufficient documentation

## 2016-06-15 DIAGNOSIS — F419 Anxiety disorder, unspecified: Secondary | ICD-10-CM | POA: Insufficient documentation

## 2016-06-15 DIAGNOSIS — I480 Paroxysmal atrial fibrillation: Secondary | ICD-10-CM

## 2016-06-15 DIAGNOSIS — M797 Fibromyalgia: Secondary | ICD-10-CM | POA: Diagnosis not present

## 2016-06-15 DIAGNOSIS — Z7901 Long term (current) use of anticoagulants: Secondary | ICD-10-CM | POA: Insufficient documentation

## 2016-06-15 DIAGNOSIS — Z6841 Body Mass Index (BMI) 40.0 and over, adult: Secondary | ICD-10-CM | POA: Diagnosis not present

## 2016-06-15 DIAGNOSIS — F329 Major depressive disorder, single episode, unspecified: Secondary | ICD-10-CM | POA: Insufficient documentation

## 2016-06-15 DIAGNOSIS — G4730 Sleep apnea, unspecified: Secondary | ICD-10-CM | POA: Diagnosis not present

## 2016-06-15 DIAGNOSIS — M1990 Unspecified osteoarthritis, unspecified site: Secondary | ICD-10-CM | POA: Diagnosis not present

## 2016-06-15 DIAGNOSIS — E669 Obesity, unspecified: Secondary | ICD-10-CM | POA: Diagnosis not present

## 2016-06-15 MED ORDER — METOPROLOL SUCCINATE ER 25 MG PO TB24: 12.5000 mg | ORAL_TABLET | Freq: Every day | ORAL | 6 refills | Status: DC

## 2016-06-15 NOTE — Progress Notes (Signed)
Patient ID: Angela Cummings, female   DOB: July 13, 1955, 61 y.o.   MRN: [id number]     Primary Care Physician: Reginia Forts, MD Referring Physician: [facility] f/u Cardiologist: Dr. [name] is a 61 y.o. female with a h/o persistent afib recently failing Rythmol and admitted for tikosyn load 1/10 through 06/25/15. She required dose adjustment to 250 mg bid due to prolonged qtc. Labs were stable. She returned  1/20 saying that she felt better and heart is staying in rhythm other than 20 mins of afib that resolved after taking her am medicine. Qtc stable.   2/17- She returns to afib clinic for [place] f/u. She had a bad day on Tuesday, feels like she may have been in afib. She is in SR today. Otherwise no complaints.   Return 5/12- in afib clinic for f/u tikosyn. She feels well, no awareness of afib. Taking tikosyn and blood thinner on a regular basis. No bleeding issues.  F/u in afib clinic- 8/11- Pt is in afib today with rvr. States needs shoulder surgery due to rotator cuff problems, and is under stress raising her great granddaughter. She feels as though she has been having more afib later. Is taking her tikosyn on a regular basis.  In afib clinic for f/u today. She had a successful cardioversion in August. She has been staying in [place]. Continues on tikosyn.No issues with DOAC. Qtc stable.  Asked to be seen today 06/15/16 in the afib clinic for not feeling well. She was sure if she was in afib. She did run out of metoprolol over the holidays and was off drug for a few days. Her heart rate and BP are lower today than usual but she is in SR.  Today, she denies symptoms of palpitations, chest pain, shortness of breath, orthopnea, PND, lower extremity edema, dizziness, presyncope, syncope, or neurologic sequela. The patient is tolerating medications without difficulties and is otherwise without complaint today.   Past Medical History:  Diagnosis Date  . Anxiety   . Asthma   . Atrial  fibrillation ([place]) 11/2010   paroxysmal  . Bradycardia   . Depression   . DJD (degenerative joint disease)    Cervical spine  . Fibromyalgia    s/p rheumatology consultation/Zeminsky  . Mild sleep apnea   . Obesity   . Osteoarthritis    Shoulders, hips,knees   Past Surgical History:  Procedure Laterality Date  . ABLATION  08/2012   PVI by Dr [name]  . ABLATION  11/13/2013   PVI by Dr [name]  . ATRIAL FIBRILLATION ABLATION N/A 09/03/2012   Procedure: ATRIAL FIBRILLATION ABLATION;  Surgeon: Thompson Grayer, MD;  Location: [facility] CATH LAB;  Service: Cardiovascular;  Laterality: N/A;  . ATRIAL FIBRILLATION ABLATION N/A 11/13/2013   Procedure: ATRIAL FIBRILLATION ABLATION;  Surgeon: Coralyn Mark, MD;  Location: [place] CATH LAB;  Service: Cardiovascular;  Laterality: N/A;  . CARDIOVERSION  03/13/2012   Procedure: CARDIOVERSION;  Surgeon: [name], MD;  Location: [facility] ENDOSCOPY;  Service: Cardiovascular;  Laterality: N/A;  . CARDIOVERSION N/A 07/26/2012   Procedure: CARDIOVERSION;  Surgeon: Thompson Grayer, MD;  Location: [place];  Service: Cardiovascular;  Laterality: N/A;  . CARDIOVERSION N/A 02/20/2014   Procedure: CARDIOVERSION;  Surgeon: Larey Dresser, MD;  Location: [place];  Service: Cardiovascular;  Laterality: N/A;  . CESAREAN SECTION    . CHOLECYSTECTOMY    . HERNIA REPAIR    . KNEE SURGERY    . neck tumor resection  benign  . TEE WITHOUT CARDIOVERSION  03/13/2012   Procedure: TRANSESOPHAGEAL ECHOCARDIOGRAM (TEE);  Surgeon: [name], MD;  Location: [place];  Service: Cardiovascular;  Laterality: N/A;  Rm 2029  . TEE WITHOUT CARDIOVERSION N/A 09/02/2012   Procedure: TRANSESOPHAGEAL ECHOCARDIOGRAM (TEE);  Surgeon: Josue Hector, MD;  Location: [place];  Service: Cardiovascular;  Laterality: N/A;  . TEE WITHOUT CARDIOVERSION N/A 11/13/2013   Procedure: TRANSESOPHAGEAL ECHOCARDIOGRAM (TEE);  Surgeon: [name], MD;  Location: [facility] ENDOSCOPY;  Service: Cardiovascular;   Laterality: N/A;    Current Outpatient Prescriptions  Medication Sig Dispense Refill  . diclofenac sodium (VOLTAREN) 1 % GEL Apply 4 g topically 4 (four) times daily. 100 g 3  . Diclofenac-Misoprostol 75-0.2 MG TBEC TAKE 1 TABLET BY MOUTH TWICE DAILY 60 tablet 0  . diltiazem (CARDIZEM CD) 240 MG 24 hr capsule Take 1 capsule (240 mg total) by mouth daily. 90 capsule 3  . dofetilide (TIKOSYN) 250 MCG capsule Take 1 capsule (250 mcg total) by mouth 2 (two) times daily. 180 capsule 3  . FLUoxetine (PROZAC) 20 MG tablet Take 1 tablet (20 mg total) by mouth daily. 30 tablet 5  . metoprolol succinate (TOPROL XL) 25 MG 24 hr tablet Take 0.5 tablets (12.5 mg total) by mouth daily. 30 tablet 6  . pantoprazole (PROTONIX) 40 MG tablet Take 1 tablet (40 mg total) by mouth daily. 30 tablet 3  . potassium chloride SA (K-DUR,KLOR-CON) 20 MEQ tablet Take 2 tablets (40 mEq total) by mouth 2 (two) times daily. 120 tablet 6  . PROVENTIL HFA 108 (90 Base) MCG/ACT inhaler INHALE 2 PUFFS INTO THE LUNGS EVERY 6 HOURS AS NEEDED 6.7 g 0  . rivaroxaban (XARELTO) 20 MG TABS tablet Take 1 tablet (20 mg total) by mouth daily with supper. 90 tablet 3  . diltiazem (CARDIZEM) 30 MG tablet Take 1 tablet (30 mg total) by mouth daily as needed. (Patient not taking: Reported on 06/15/2016) 30 tablet 5   No current facility-administered medications for this encounter.     Allergies  Allergen Reactions  . Adhesive [Tape] Itching and Rash    Paper tape is better but best to use no adhesive if possible    Social History   Social History  . Marital status: Widowed    Spouse name: N/A  . Number of children: 3  . Years of education: N/A   Occupational History  . Not on file.   Social History Main Topics  . Smoking status: Never Smoker  . Smokeless tobacco: Never Used  . Alcohol use No     Comment: Rarely  . Drug use: No  . Sexual activity: No   Other Topics Concern  . Not on file   Social History Narrative    Marital status: widowed since 01/2014 of melanoma; not dating.       Children:  3 children (17,30, 32) and 4 grandchildren and 1 gg. 1 adopted child.       Lives with son.  [place] in [place].        Employment:  Unemployed.       Tobacco: none      Alcohol: none      Drugs: none      Exercise: none    Family History  Problem Relation Age of Onset  . Emphysema Mother   . COPD Mother   . Anxiety disorder Mother   . Cancer Father     lung cancer; non-smoker  . Heart disease  Father 34    AMI  . COPD Sister   . Emphysema Brother   . Cancer Daughter   . Cancer Sister     lung cancer  . Heart disease Sister     AMI  . Diabetes Other   . Thyroid disease Other     ROS- All systems are reviewed and negative except as per the HPI above  Physical Exam: Vitals:   06/15/16 1149  BP: 92/60  Pulse: (!) 51  Weight: 211 lb 12.8 oz (96.1 kg)  Height: 5' 0.5" (1.537 m)    GEN- The patient is well appearing, alert and oriented x 3 today.   Head- normocephalic, atraumatic Eyes-  Sclera clear, conjunctiva pink Ears- hearing intact Oropharynx- clear Neck- supple, no JVP Lymph- no cervical lymphadenopathy Lungs- Clear to ausculation bilaterally, normal work of breathing Heart- Regular rate and rhythm, no murmurs, rubs or gallops, PMI not laterally displaced GI- soft, NT, ND, + BS Extremities- no clubbing, cyanosis, or edema MS- no significant deformity or atrophy Skin- no rash or lesion Psych- euthymic mood, full affect Neuro- strength and sensation are intact  EKG- S brady at 51 bpm, pr int 172 ms, qrs int 98 ms , qtc 436 ms Epic records reviewed  Assessment and Plan: 1. PAF Feels sluggish, but in SR Will reduce metoprolol to 12.5 mg at night  Monitor BP/HR at home and let me know if she does not see improvement in numbers or does not feel  better after change 30 mg Cardizem as needed for rate control ContinueTikosyn Continue eliquis     F/u in afib clinic as  scheduled   Butch Penny C. Rasheda Ledger, [facility] [address] [place], [place] [address] [phone]

## 2016-07-19 ENCOUNTER — Other Ambulatory Visit: Payer: Self-pay | Admitting: Family Medicine

## 2016-07-19 NOTE — Telephone Encounter (Signed)
Last OV was with Dr. Tamala Julian on 12/2015. I will refill medication for 6 months. There is no mention about follow on that note and 1 year from last OV is appropriate for refills. I will forward this to Dr. Tamala Julian for her review.

## 2016-07-20 NOTE — Telephone Encounter (Signed)
Noted  

## 2016-08-09 ENCOUNTER — Ambulatory Visit (HOSPITAL_COMMUNITY): Payer: Commercial Managed Care - HMO | Admitting: Nurse Practitioner

## 2016-08-10 ENCOUNTER — Encounter (HOSPITAL_COMMUNITY): Payer: Self-pay | Admitting: Nurse Practitioner

## 2016-08-10 ENCOUNTER — Ambulatory Visit (HOSPITAL_COMMUNITY)
Admission: RE | Admit: 2016-08-10 | Discharge: 2016-08-10 | Disposition: A | Discharge status: Home / Self Care | Payer: Commercial Managed Care - HMO | Source: Ambulatory Visit | Attending: Nurse Practitioner | Admitting: Nurse Practitioner

## 2016-08-10 VITALS — BP 136/80 | HR 68 | Ht 60.5 in | Wt 218.2 lb

## 2016-08-10 DIAGNOSIS — I4510 Unspecified right bundle-branch block: Secondary | ICD-10-CM | POA: Insufficient documentation

## 2016-08-10 DIAGNOSIS — M47812 Spondylosis without myelopathy or radiculopathy, cervical region: Secondary | ICD-10-CM | POA: Diagnosis not present

## 2016-08-10 DIAGNOSIS — F419 Anxiety disorder, unspecified: Secondary | ICD-10-CM | POA: Insufficient documentation

## 2016-08-10 DIAGNOSIS — Z7901 Long term (current) use of anticoagulants: Secondary | ICD-10-CM | POA: Diagnosis not present

## 2016-08-10 DIAGNOSIS — I480 Paroxysmal atrial fibrillation: Secondary | ICD-10-CM | POA: Diagnosis present

## 2016-08-10 DIAGNOSIS — I481 Persistent atrial fibrillation: Secondary | ICD-10-CM | POA: Diagnosis not present

## 2016-08-10 DIAGNOSIS — M1990 Unspecified osteoarthritis, unspecified site: Secondary | ICD-10-CM | POA: Diagnosis not present

## 2016-08-10 DIAGNOSIS — F329 Major depressive disorder, single episode, unspecified: Secondary | ICD-10-CM | POA: Insufficient documentation

## 2016-08-10 DIAGNOSIS — J45909 Unspecified asthma, uncomplicated: Secondary | ICD-10-CM | POA: Diagnosis not present

## 2016-08-10 DIAGNOSIS — I4819 Other persistent atrial fibrillation: Secondary | ICD-10-CM

## 2016-08-10 DIAGNOSIS — R001 Bradycardia, unspecified: Secondary | ICD-10-CM | POA: Insufficient documentation

## 2016-08-10 DIAGNOSIS — E669 Obesity, unspecified: Secondary | ICD-10-CM | POA: Insufficient documentation

## 2016-08-10 DIAGNOSIS — M797 Fibromyalgia: Secondary | ICD-10-CM | POA: Diagnosis not present

## 2016-08-10 LAB — BASIC METABOLIC PANEL
Anion gap: 6 (ref 5–15)
BUN: 9 mg/dL (ref 6–20)
CO2: 26 mmol/L (ref 22–32)
CREATININE: 0.74 mg/dL (ref 0.44–1.00)
Calcium: 8.9 mg/dL (ref 8.9–10.3)
Chloride: 105 mmol/L (ref 101–111)
GLUCOSE: 92 mg/dL (ref 65–99)
Potassium: 3.8 mmol/L (ref 3.5–5.1)
Sodium: 137 mmol/L (ref 135–145)

## 2016-08-10 LAB — MAGNESIUM: MAGNESIUM: 2 mg/dL (ref 1.7–2.4)

## 2016-08-10 NOTE — Progress Notes (Signed)
Patient ID: FEATHER EUSTAQUIO, female   DOB: Dec 23, 1955, 61 y.o.   MRN: [id number]     Primary Care Physician: Reginia Forts, MD Referring Physician: [facility] f/u Cardiologist: Dr. [name] is a 61 y.o. female with a h/o persistent afib recently failing Rythmol and admitted for tikosyn load 1/10 through 06/25/15. She required dose adjustment to 250 mg bid due to prolonged qtc. Labs were stable. She returned  1/20 saying that she felt better and heart is staying in rhythm other than 20 mins of afib that resolved after taking her am medicine. Qtc stable.   2/17- She returns to afib clinic for [place] f/u. She had a bad day on Tuesday, feels like she may have been in afib. She is in SR today. Otherwise no complaints.   Return 5/12- in afib clinic for f/u tikosyn. She feels well, no awareness of afib. Taking tikosyn and blood thinner on a regular basis. No bleeding issues.  F/u in afib clinic- 8/11- Pt is in afib today with rvr. States needs shoulder surgery due to rotator cuff problems, and is under stress raising her great granddaughter. She feels as though she has been having more afib later. Is taking her tikosyn on a regular basis.  In afib clinic for f/u today. She had a successful cardioversion in August. She has been staying in [place]. Continues on tikosyn.No issues with DOAC. Qtc stable.  Asked to be seen today 06/15/16 in the afib clinic for not feeling well. She was sure if she was in afib. She did run out of metoprolol over the holidays and was off drug for a few days. Her heart rate and BP are lower today than usual but she is in SR.  F/u afib clinic 3/1. She is staying in [facility] on [address]. Is having some pain with  left knee and has noted some swelling of left ankle. Fears arthritis is worsening in her knee. Plans to discuss with orthopedic surgeon when she f/u's up on rt shoulder surgery.  Today, she denies symptoms of palpitations, chest pain, shortness of  breath, orthopnea, PND, lower extremity edema, dizziness, presyncope, syncope, or neurologic sequela. The patient is tolerating medications without difficulties and is otherwise without complaint today.   Past Medical History:  Diagnosis Date  . Anxiety   . Asthma   . Atrial fibrillation ([place]) 11/2010   paroxysmal  . Bradycardia   . Depression   . DJD (degenerative joint disease)    Cervical spine  . Fibromyalgia    s/p rheumatology consultation/Zeminsky  . Mild sleep apnea   . Obesity   . Osteoarthritis    Shoulders, hips,knees   Past Surgical History:  Procedure Laterality Date  . ABLATION  08/2012   PVI by Dr [name]  . ABLATION  11/13/2013   PVI by Dr [name]  . ATRIAL FIBRILLATION ABLATION N/A 09/03/2012   Procedure: ATRIAL FIBRILLATION ABLATION;  Surgeon: Thompson Grayer, MD;  Location: [facility] CATH LAB;  Service: Cardiovascular;  Laterality: N/A;  . ATRIAL FIBRILLATION ABLATION N/A 11/13/2013   Procedure: ATRIAL FIBRILLATION ABLATION;  Surgeon: Coralyn Mark, MD;  Location: [place] CATH LAB;  Service: Cardiovascular;  Laterality: N/A;  . CARDIOVERSION  03/13/2012   Procedure: CARDIOVERSION;  Surgeon: [name], MD;  Location: [facility] ENDOSCOPY;  Service: Cardiovascular;  Laterality: N/A;  . CARDIOVERSION N/A 07/26/2012   Procedure: CARDIOVERSION;  Surgeon: Thompson Grayer, MD;  Location: [place];  Service: Cardiovascular;  Laterality: N/A;  .  CARDIOVERSION N/A 02/20/2014   Procedure: CARDIOVERSION;  Surgeon: Larey Dresser, MD;  Location: [place];  Service: Cardiovascular;  Laterality: N/A;  . CESAREAN SECTION    . CHOLECYSTECTOMY    . HERNIA REPAIR    . KNEE SURGERY    . neck tumor resection     benign  . TEE WITHOUT CARDIOVERSION  03/13/2012   Procedure: TRANSESOPHAGEAL ECHOCARDIOGRAM (TEE);  Surgeon: [name], MD;  Location: [place];  Service: Cardiovascular;  Laterality: N/A;  Rm 2029  . TEE WITHOUT CARDIOVERSION N/A 09/02/2012   Procedure: TRANSESOPHAGEAL ECHOCARDIOGRAM  (TEE);  Surgeon: Josue Hector, MD;  Location: [place];  Service: Cardiovascular;  Laterality: N/A;  . TEE WITHOUT CARDIOVERSION N/A 11/13/2013   Procedure: TRANSESOPHAGEAL ECHOCARDIOGRAM (TEE);  Surgeon: [name], MD;  Location: [facility] ENDOSCOPY;  Service: Cardiovascular;  Laterality: N/A;    Current Outpatient Prescriptions  Medication Sig Dispense Refill  . diclofenac sodium (VOLTAREN) 1 % GEL Apply 4 g topically 4 (four) times daily. 100 g 3  . Diclofenac-Misoprostol 75-0.2 MG TBEC TAKE 1 TABLET BY MOUTH TWICE DAILY 60 tablet 0  . diltiazem (CARDIZEM CD) 240 MG 24 hr capsule Take 1 capsule (240 mg total) by mouth daily. 90 capsule 3  . diltiazem (CARDIZEM) 30 MG tablet Take 1 tablet (30 mg total) by mouth daily as needed. 30 tablet 5  . dofetilide (TIKOSYN) 250 MCG capsule Take 1 capsule (250 mcg total) by mouth 2 (two) times daily. 180 capsule 3  . FLUoxetine (PROZAC) 20 MG tablet TAKE 1 TABLET(20 MG) BY MOUTH DAILY 90 tablet 1  . pantoprazole (PROTONIX) 40 MG tablet Take 1 tablet (40 mg total) by mouth daily. 30 tablet 3  . potassium chloride SA (K-DUR,KLOR-CON) 20 MEQ tablet Take 2 tablets (40 mEq total) by mouth 2 (two) times daily. 120 tablet 6  . PROVENTIL HFA 108 (90 Base) MCG/ACT inhaler INHALE 2 PUFFS INTO THE LUNGS EVERY 6 HOURS AS NEEDED 6.7 g 0  . rivaroxaban (XARELTO) 20 MG TABS tablet Take 1 tablet (20 mg total) by mouth daily with supper. 90 tablet 3  . metoprolol succinate (TOPROL XL) 25 MG 24 hr tablet Take 0.5 tablets (12.5 mg total) by mouth daily. (Patient not taking: Reported on 08/10/2016) 30 tablet 6   No current facility-administered medications for this encounter.     Allergies  Allergen Reactions  . Adhesive [Tape] Itching and Rash    Paper tape is better but best to use no adhesive if possible    Social History   Social History  . Marital status: Widowed    Spouse name: N/A  . Number of children: 3  . Years of education: N/A   Occupational  History  . Not on file.   Social History Main Topics  . Smoking status: Never Smoker  . Smokeless tobacco: Never Used  . Alcohol use No     Comment: Rarely  . Drug use: No  . Sexual activity: No   Other Topics Concern  . Not on file   Social History Narrative   Marital status: widowed since 01/2014 of melanoma; not dating.       Children:  3 children (17,30, 32) and 4 grandchildren and 1 gg. 1 adopted child.       Lives with son.  Lives in [place].        Employment:  Unemployed.       Tobacco: none      Alcohol: none  Drugs: none      Exercise: none    Family History  Problem Relation Age of Onset  . Emphysema Mother   . COPD Mother   . Anxiety disorder Mother   . Cancer Father     lung cancer; non-smoker  . Heart disease Father 41    AMI  . COPD Sister   . Emphysema Brother   . Cancer Daughter   . Cancer Sister     lung cancer  . Heart disease Sister     AMI  . Diabetes Other   . Thyroid disease Other     ROS- All systems are reviewed and negative except as per the HPI above  Physical Exam: Vitals:   08/10/16 1124  BP: 136/80  Pulse: 68  Weight: 218 lb 3.2 oz (99 kg)  Height: 5' 0.5" (1.537 m)    GEN- The patient is well appearing, alert and oriented x 3 today.   Head- normocephalic, atraumatic Eyes-  Sclera clear, conjunctiva pink Ears- hearing intact Oropharynx- clear Neck- supple, no JVP Lymph- no cervical lymphadenopathy Lungs- Clear to ausculation bilaterally, normal work of breathing Heart- Regular rate and rhythm, no murmurs, rubs or gallops, PMI not laterally displaced GI- soft, NT, ND, + BS Extremities- no clubbing, cyanosis, or edema MS- no significant deformity or atrophy Skin- no rash or lesion Psych- euthymic mood, full affect Neuro- strength and sensation are intact  EKG- S brady at 68 bpm, pr int 162 ms, qrs int 106 ms , qtc 482 ms Epic records reviewed  Assessment and Plan: 1. PAF Maintaining  SR Continue metoprolol  to 12.5 mg at night  30 mg Cardizem as needed for rate control for PAF(quiet) ContinueTikosyn Continue eliquis Bmet/mag today     F/u in afib clinic 41/2 months   Butch Penny C. Tyresha Fede, [facility] [address] [place],  [address] [phone]

## 2016-09-04 ENCOUNTER — Other Ambulatory Visit (HOSPITAL_COMMUNITY): Payer: Self-pay | Admitting: Nurse Practitioner

## 2016-10-22 ENCOUNTER — Other Ambulatory Visit (HOSPITAL_COMMUNITY): Payer: Self-pay | Admitting: Nurse Practitioner

## 2016-12-18 ENCOUNTER — Ambulatory Visit (HOSPITAL_COMMUNITY)
Admission: RE | Admit: 2016-12-18 | Discharge: 2016-12-18 | Disposition: A | Discharge status: Home / Self Care | Payer: Commercial Managed Care - HMO | Source: Ambulatory Visit | Attending: Nurse Practitioner | Admitting: Nurse Practitioner

## 2016-12-18 ENCOUNTER — Encounter (HOSPITAL_COMMUNITY): Payer: Self-pay | Admitting: Nurse Practitioner

## 2016-12-18 VITALS — BP 112/68 | HR 65 | Ht 60.5 in | Wt 206.6 lb

## 2016-12-18 DIAGNOSIS — Z801 Family history of malignant neoplasm of trachea, bronchus and lung: Secondary | ICD-10-CM | POA: Diagnosis not present

## 2016-12-18 DIAGNOSIS — F419 Anxiety disorder, unspecified: Secondary | ICD-10-CM | POA: Diagnosis not present

## 2016-12-18 DIAGNOSIS — M1990 Unspecified osteoarthritis, unspecified site: Secondary | ICD-10-CM | POA: Insufficient documentation

## 2016-12-18 DIAGNOSIS — I480 Paroxysmal atrial fibrillation: Secondary | ICD-10-CM | POA: Diagnosis not present

## 2016-12-18 DIAGNOSIS — Z8249 Family history of ischemic heart disease and other diseases of the circulatory system: Secondary | ICD-10-CM | POA: Insufficient documentation

## 2016-12-18 DIAGNOSIS — Z818 Family history of other mental and behavioral disorders: Secondary | ICD-10-CM | POA: Insufficient documentation

## 2016-12-18 DIAGNOSIS — G4730 Sleep apnea, unspecified: Secondary | ICD-10-CM | POA: Diagnosis not present

## 2016-12-18 DIAGNOSIS — Z7901 Long term (current) use of anticoagulants: Secondary | ICD-10-CM | POA: Diagnosis not present

## 2016-12-18 DIAGNOSIS — Z79899 Other long term (current) drug therapy: Secondary | ICD-10-CM | POA: Diagnosis not present

## 2016-12-18 DIAGNOSIS — J45909 Unspecified asthma, uncomplicated: Secondary | ICD-10-CM | POA: Diagnosis not present

## 2016-12-18 DIAGNOSIS — Z6839 Body mass index (BMI) 39.0-39.9, adult: Secondary | ICD-10-CM | POA: Insufficient documentation

## 2016-12-18 DIAGNOSIS — M797 Fibromyalgia: Secondary | ICD-10-CM | POA: Insufficient documentation

## 2016-12-18 DIAGNOSIS — I4819 Other persistent atrial fibrillation: Secondary | ICD-10-CM

## 2016-12-18 DIAGNOSIS — M47812 Spondylosis without myelopathy or radiculopathy, cervical region: Secondary | ICD-10-CM | POA: Insufficient documentation

## 2016-12-18 DIAGNOSIS — F329 Major depressive disorder, single episode, unspecified: Secondary | ICD-10-CM | POA: Diagnosis not present

## 2016-12-18 DIAGNOSIS — E669 Obesity, unspecified: Secondary | ICD-10-CM | POA: Insufficient documentation

## 2016-12-18 DIAGNOSIS — Z833 Family history of diabetes mellitus: Secondary | ICD-10-CM | POA: Insufficient documentation

## 2016-12-18 DIAGNOSIS — I481 Persistent atrial fibrillation: Secondary | ICD-10-CM | POA: Diagnosis not present

## 2016-12-18 LAB — BASIC METABOLIC PANEL
ANION GAP: 8 (ref 5–15)
BUN: 11 mg/dL (ref 6–20)
CO2: 23 mmol/L (ref 22–32)
Calcium: 8.9 mg/dL (ref 8.9–10.3)
Chloride: 106 mmol/L (ref 101–111)
Creatinine, Ser: 0.72 mg/dL (ref 0.44–1.00)
Glucose, Bld: 89 mg/dL (ref 65–99)
POTASSIUM: 4.4 mmol/L (ref 3.5–5.1)
SODIUM: 137 mmol/L (ref 135–145)

## 2016-12-18 LAB — MAGNESIUM: MAGNESIUM: 2 mg/dL (ref 1.7–2.4)

## 2016-12-18 NOTE — Progress Notes (Signed)
Patient ID: Angela Cummings, female   DOB: 25-Aug-1955, 61 y.o.   MRN: [id number]     Primary Care Physician: Wardell Honour, MD Referring Physician: [facility] f/u Cardiologist: Dr. [name] is a 61 y.o. female with a h/o persistent afib recently failing Rythmol and admitted for tikosyn load 1/10 through 06/25/15. She required dose adjustment to 250 mg bid due to prolonged qtc. Labs were stable. She returned  1/20 saying that she felt better and heart is staying in rhythm other than 20 mins of afib that resolved after taking her am medicine. Qtc stable.   2/17- She returns to afib clinic for [place] f/u. She had a bad day on Tuesday, feels like she may have been in afib. She is in SR today. Otherwise no complaints.   Return 5/12- in afib clinic for f/u tikosyn. She feels well, no awareness of afib. Taking tikosyn and blood thinner on a regular basis. No bleeding issues.  F/u in afib clinic- 8/11- Pt is in afib today with rvr. States needs shoulder surgery due to rotator cuff problems, and is under stress raising her great granddaughter. She feels as though she has been having more afib later. Is taking her tikosyn on a regular basis.  In afib clinic for f/u today. She had a successful cardioversion in August. She has been staying in [place]. Continues on tikosyn.No issues with DOAC. Qtc stable.  Asked to be seen today 06/15/16 in the afib clinic for not feeling well. She was sure if she was in afib. She did run out of metoprolol over the holidays and was off drug for a few days. Her heart rate and BP are lower today than usual but she is in SR.  F/u afib clinic 3/1. She is staying in [facility] on [address]. Is having some pain with  left knee and has noted some swelling of left ankle. Fears arthritis is worsening in her knee. Plans to discuss with orthopedic surgeon when she f/u's up on rt shoulder surgery.  F/u afib clinic 7/9. She has been feeling well. No recent afib.  She is compliant with tikosyn and apixaban. No bleeding issues.  Today, she denies symptoms of palpitations, chest pain, shortness of breath, orthopnea, PND, lower extremity edema, dizziness, presyncope, syncope, or neurologic sequela. The patient is tolerating medications without difficulties and is otherwise without complaint today.   Past Medical History:  Diagnosis Date  . Anxiety   . Asthma   . Atrial fibrillation ([place]) 11/2010   paroxysmal  . Bradycardia   . Depression   . DJD (degenerative joint disease)    Cervical spine  . Fibromyalgia    s/p rheumatology consultation/Zeminsky  . Mild sleep apnea   . Obesity   . Osteoarthritis    Shoulders, hips,knees   Past Surgical History:  Procedure Laterality Date  . ABLATION  08/2012   PVI by Dr [name]  . ABLATION  11/13/2013   PVI by Dr [name]  . ATRIAL FIBRILLATION ABLATION N/A 09/03/2012   Procedure: ATRIAL FIBRILLATION ABLATION;  Surgeon: Thompson Grayer, MD;  Location[facility] C[facility] LAB;  Service: Cardiovascular;  Laterality: N/A;  . ATRIAL FIBRILLATION ABLATION N/A 11/13/2013   Procedure: ATRIAL FIBRILLATION ABLATION;  Surgeon: Coralyn Mark, MD;  Location: [place] CATH LAB;  Service: Cardiovascular;  Laterality: N/A;  . CARDIOVERSION  03/13/2012   Procedure: CARDIOVERSION;  Surgeon: [name], MD;  Location: [place];  Service: Cardiovascular;  Laterality: N/A;  .  CARDIOVERSION N/A 07/26/2012   Procedure: CARDIOVERSION;  Surgeon: Thompson Grayer, MD;  Location: [place];  Service: Cardiovascular;  Laterality: N/A;  . CARDIOVERSION N/A 02/20/2014   Procedure: CARDIOVERSION;  Surgeon: Larey Dresser, MD;  Location: [place];  Service: Cardiovascular;  Laterality: N/A;  . CESAREAN SECTION    . CHOLECYSTECTOMY    . HERNIA REPAIR    . KNEE SURGERY    . neck tumor resection     benign  . TEE WITHOUT CARDIOVERSION  03/13/2012   Procedure: TRANSESOPHAGEAL ECHOCARDIOGRAM (TEE);  Surgeon: [name], MD;  Location: [place];   Service: Cardiovascular;  Laterality: N/A;  Rm 2029  . TEE WITHOUT CARDIOVERSION N/A 09/02/2012   Procedure: TRANSESOPHAGEAL ECHOCARDIOGRAM (TEE);  Surgeon: Josue Hector, MD;  Location: [place];  Service: Cardiovascular;  Laterality: N/A;  . TEE WITHOUT CARDIOVERSION N/A 11/13/2013   Procedure: TRANSESOPHAGEAL ECHOCARDIOGRAM (TEE);  Surgeon: [name], MD;  Location: [facility] ENDOSCOPY;  Service: Cardiovascular;  Laterality: N/A;    Current Outpatient Prescriptions  Medication Sig Dispense Refill  . CARTIA XT 240 MG 24 hr capsule TAKE 1 CAPSULE(240 MG) BY MOUTH DAILY 90 capsule 2  . diclofenac sodium (VOLTAREN) 1 % GEL Apply 4 g topically 4 (four) times daily. 100 g 3  . Diclofenac-Misoprostol 75-0.2 MG TBEC TAKE 1 TABLET BY MOUTH TWICE DAILY 60 tablet 0  . dofetilide (TIKOSYN) 250 MCG capsule TAKE 1 CAPSULE(250 MCG) BY MOUTH TWICE DAILY 180 capsule 2  . FLUoxetine (PROZAC) 20 MG tablet TAKE 1 TABLET(20 MG) BY MOUTH DAILY 90 tablet 1  . metoprolol succinate (TOPROL XL) 25 MG 24 hr tablet Take 0.5 tablets (12.5 mg total) by mouth daily. 30 tablet 6  . pantoprazole (PROTONIX) 40 MG tablet Take 1 tablet (40 mg total) by mouth daily. 30 tablet 3  . potassium chloride SA (K-DUR,KLOR-CON) 20 MEQ tablet TAKE 2 TABLETS BY MOUTH TWICE DAILY 120 tablet 6  . PROVENTIL HFA 108 (90 Base) MCG/ACT inhaler INHALE 2 PUFFS INTO THE LUNGS EVERY 6 HOURS AS NEEDED 6.7 g 0  . XARELTO 20 MG TABS tablet TAKE 1 TABLET(20 MG) BY MOUTH DAILY WITH SUPPER 90 tablet 2  . diltiazem (CARDIZEM) 30 MG tablet Take 1 tablet (30 mg total) by mouth daily as needed. (Patient not taking: Reported on 12/18/2016) 30 tablet 5   No current facility-administered medications for this encounter.     Allergies  Allergen Reactions  . Adhesive [Tape] Itching and Rash    Paper tape is better but best to use no adhesive if possible    Social History   Social History  . Marital status: Widowed    Spouse name: N/A  . Number of  children: 3  . Years of education: N/A   Occupational History  . Not on file.   Social History Main Topics  . Smoking status: Never Smoker  . Smokeless tobacco: Never Used  . Alcohol use No     Comment: Rarely  . Drug use: No  . Sexual activity: No   Other Topics Concern  . Not on file   Social History Narrative   Marital status: widowed since 01/2014 of melanoma; not dating.       Children:  3 children (17,30, 32) and 4 grandchildren and 1 gg. 1 adopted child.       Lives with son.  Lives in [place].        Employment:  Unemployed.       Tobacco: none  Alcohol: none      Drugs: none      Exercise: none    Family History  Problem Relation Age of Onset  . Emphysema Mother   . COPD Mother   . Anxiety disorder Mother   . Cancer Father        lung cancer; non-smoker  . Heart disease Father 42       AMI  . COPD Sister   . Emphysema Brother   . Cancer Daughter   . Cancer Sister        lung cancer  . Heart disease Sister        AMI  . Diabetes Other   . Thyroid disease Other     ROS- All systems are reviewed and negative except as per the HPI above  Physical Exam: Vitals:   12/18/16 1450  BP: 112/68  Pulse: 65  Weight: 206 lb 9.6 oz (93.7 kg)  Height: 5' 0.5" (1.537 m)    GEN- The patient is well appearing, alert and oriented x 3 today.   Head- normocephalic, atraumatic Eyes-  Sclera clear, conjunctiva pink Ears- hearing intact Oropharynx- clear Neck- supple, no JVP Lymph- no cervical lymphadenopathy Lungs- Clear to ausculation bilaterally, normal work of breathing Heart- Regular rate and rhythm, no murmurs, rubs or gallops, PMI not laterally displaced GI- soft, NT, ND, + BS Extremities- no clubbing, cyanosis, or edema MS- no significant deformity or atrophy Skin- no rash or lesion Psych- euthymic mood, full affect Neuro- strength and sensation are intact  EKG- NSR at 65 bpm, pr int 178 ms, qrs int 82 ms, qtc 459 ms(stable) Epic records  reviewed  Assessment and Plan: 1. Paroxysmal atrial fibrillation Maintaining  SR Continue metoprolol 12.5 mg at hs 30 mg Cardizem as needed for rate control for breakthrough afib  ContinueTikosyn Continue eliquis Bmet/mag today  F/u 4-5 months  Butch Penny C. Latria Mccarron, [facility] [address] [place], [place] [address] [phone]

## 2017-02-17 ENCOUNTER — Other Ambulatory Visit: Payer: Self-pay | Admitting: Urgent Care

## 2017-04-18 ENCOUNTER — Other Ambulatory Visit (HOSPITAL_COMMUNITY): Payer: Self-pay | Admitting: *Deleted

## 2017-04-18 MED ORDER — POTASSIUM CHLORIDE CRYS ER 20 MEQ PO TBCR: 40.0000 meq | EXTENDED_RELEASE_TABLET | Freq: Two times a day (BID) | ORAL | 2 refills | Status: DC

## 2017-04-23 ENCOUNTER — Other Ambulatory Visit (HOSPITAL_COMMUNITY): Payer: Self-pay | Admitting: *Deleted

## 2017-05-16 ENCOUNTER — Other Ambulatory Visit (HOSPITAL_COMMUNITY): Payer: Self-pay | Admitting: *Deleted

## 2017-05-16 MED ORDER — DOFETILIDE 250 MCG PO CAPS: ORAL_CAPSULE | ORAL | 2 refills | Status: DC

## 2017-05-25 ENCOUNTER — Ambulatory Visit (HOSPITAL_COMMUNITY): Payer: Commercial Managed Care - HMO | Admitting: Nurse Practitioner

## 2017-05-29 ENCOUNTER — Encounter (HOSPITAL_COMMUNITY): Payer: Self-pay | Admitting: Nurse Practitioner

## 2017-05-29 ENCOUNTER — Ambulatory Visit (HOSPITAL_COMMUNITY)
Admission: RE | Admit: 2017-05-29 | Discharge: 2017-05-29 | Disposition: A | Discharge status: Home / Self Care | Payer: 59 | Source: Ambulatory Visit | Attending: Nurse Practitioner | Admitting: Nurse Practitioner

## 2017-05-29 VITALS — BP 126/72 | HR 63 | Ht 60.5 in | Wt 206.0 lb

## 2017-05-29 DIAGNOSIS — M797 Fibromyalgia: Secondary | ICD-10-CM | POA: Diagnosis not present

## 2017-05-29 DIAGNOSIS — Z79899 Other long term (current) drug therapy: Secondary | ICD-10-CM | POA: Diagnosis not present

## 2017-05-29 DIAGNOSIS — E669 Obesity, unspecified: Secondary | ICD-10-CM | POA: Diagnosis not present

## 2017-05-29 DIAGNOSIS — F419 Anxiety disorder, unspecified: Secondary | ICD-10-CM | POA: Insufficient documentation

## 2017-05-29 DIAGNOSIS — Z7901 Long term (current) use of anticoagulants: Secondary | ICD-10-CM | POA: Diagnosis not present

## 2017-05-29 DIAGNOSIS — I4891 Unspecified atrial fibrillation: Secondary | ICD-10-CM | POA: Diagnosis present

## 2017-05-29 DIAGNOSIS — I481 Persistent atrial fibrillation: Secondary | ICD-10-CM | POA: Insufficient documentation

## 2017-05-29 DIAGNOSIS — J45909 Unspecified asthma, uncomplicated: Secondary | ICD-10-CM | POA: Diagnosis not present

## 2017-05-29 DIAGNOSIS — F329 Major depressive disorder, single episode, unspecified: Secondary | ICD-10-CM | POA: Insufficient documentation

## 2017-05-29 DIAGNOSIS — I480 Paroxysmal atrial fibrillation: Secondary | ICD-10-CM | POA: Diagnosis not present

## 2017-05-29 DIAGNOSIS — G4730 Sleep apnea, unspecified: Secondary | ICD-10-CM | POA: Insufficient documentation

## 2017-05-29 LAB — BASIC METABOLIC PANEL
Anion gap: 6 (ref 5–15)
BUN: 12 mg/dL (ref 6–20)
CALCIUM: 8.6 mg/dL — AB (ref 8.9–10.3)
CHLORIDE: 106 mmol/L (ref 101–111)
CO2: 23 mmol/L (ref 22–32)
CREATININE: 0.78 mg/dL (ref 0.44–1.00)
Glucose, Bld: 150 mg/dL — ABNORMAL HIGH (ref 65–99)
Potassium: 3.7 mmol/L (ref 3.5–5.1)
SODIUM: 135 mmol/L (ref 135–145)

## 2017-05-29 LAB — MAGNESIUM: MAGNESIUM: 2 mg/dL (ref 1.7–2.4)

## 2017-05-29 MED ORDER — PANTOPRAZOLE SODIUM 40 MG PO TBEC: 40.0000 mg | DELAYED_RELEASE_TABLET | Freq: Every day | ORAL | 3 refills | Status: DC

## 2017-05-29 NOTE — Progress Notes (Signed)
Patient ID: Angela Cummings, female   DOB: 1955/12/24, 61 y.o.   MRN: [id number]     Primary Care Physician: Wardell Honour, MD Referring Physician: [facility] f/u Cardiologist: Dr. [name] is a 61 y.o. female with a h/o persistent afib recently failing Rythmol and admitted for tikosyn load 1/10 through 06/25/15. She required dose adjustment to 250 mg bid due to prolonged qtc. Labs were stable. She returned  1/20 saying that she felt better and heart is staying in rhythm other than 20 mins of afib that resolved after taking her am medicine. Qtc stable.   2/17- She returns to afib clinic for [place] f/u. She had a bad day on Tuesday, feels like she may have been in afib. She is in SR today. Otherwise no complaints.   Return 5/12- in afib clinic for f/u tikosyn. She feels well, no awareness of afib. Taking tikosyn and blood thinner on a regular basis. No bleeding issues.  F/u in afib clinic- 8/11- Pt is in afib today with rvr. States needs shoulder surgery due to rotator cuff problems, and is under stress raising her great granddaughter. She feels as though she has been having more afib later. Is taking her tikosyn on a regular basis.  In afib clinic for f/u today. She had a successful cardioversion in August. She has been staying in [place]. Continues on tikosyn.No issues with DOAC. Qtc stable.  Asked to be seen today 06/15/16 in the afib clinic for not feeling well. She was sure if she was in afib. She did run out of metoprolol over the holidays and was off drug for a few days. Her heart rate and BP are lower today than usual but she is in SR.  F/u afib clinic 3/1. She is staying in [facility] on [address]. Is having some pain with  left knee and has noted some swelling of left ankle. Fears arthritis is worsening in her knee. Plans to discuss with orthopedic surgeon when she f/u's up on rt shoulder surgery.  F/u afib clinic 12/18/16. She has been feeling well. No recent afib.  She is compliant with tikosyn and apixaban. No bleeding issues.  F/u in afib clinic for Tikosyn 12/19. She continues on Tikosyn and is not having any afib. She is being compliant  with xarelto. No bleeding issues. Stays busy raising 3 grandchildren. She is having some issues swallowing food items and is seeing an ENT this week.  Today, she denies symptoms of palpitations, chest pain, shortness of breath, orthopnea, PND, lower extremity edema, dizziness, presyncope, syncope, or neurologic sequela. The patient is tolerating medications without difficulties and is otherwise without complaint today.   Past Medical History:  Diagnosis Date  . Anxiety   . Asthma   . Atrial fibrillation ([place]) 11/2010   paroxysmal  . Bradycardia   . Depression   . DJD (degenerative joint disease)    Cervical spine  . Fibromyalgia    s/p rheumatology consultation/Zeminsky  . Mild sleep apnea   . Obesity   . Osteoarthritis    Shoulders, hips,knees   Past Surgical History:  Procedure Laterality Date  . ABLATION  08/2012   PVI by Dr [name]  . ABLATION  11/13/2013   PVI by Dr [name]  . ATRIAL FIBRILLATION ABLATION N/A 09/03/2012   Procedure: ATRIAL FIBRILLATION ABLATION;  Surgeon: Thompson Grayer, MD;  Location[facility] CATH LAB;  Service: Cardiovascular;  Laterality: N/A;  . ATRIAL FIBRILLATION ABLATION N/A 11/13/2013  Procedure: ATRIAL FIBRILLATION ABLATION;  Surgeon: Coralyn Mark, MD;  Location: [place] CATH LAB;  Service: Cardiovascular;  Laterality: N/A;  . CARDIOVERSION  03/13/2012   Procedure: CARDIOVERSION;  Surgeon: [name], MD;  Location: [facility] ENDOSCOPY;  Service: Cardiovascular;  Laterality: N/A;  . CARDIOVERSION N/A 07/26/2012   Procedure: CARDIOVERSION;  Surgeon: Thompson Grayer, MD;  Location: [place];  Service: Cardiovascular;  Laterality: N/A;  . CARDIOVERSION N/A 02/20/2014   Procedure: CARDIOVERSION;  Surgeon: Larey Dresser, MD;  Location: [place];  Service: Cardiovascular;  Laterality: N/A;  .  CESAREAN SECTION    . CHOLECYSTECTOMY    . HERNIA REPAIR    . KNEE SURGERY    . neck tumor resection     benign  . TEE WITHOUT CARDIOVERSION  03/13/2012   Procedure: TRANSESOPHAGEAL ECHOCARDIOGRAM (TEE);  Surgeon: [name], MD;  Location: [place];  Service: Cardiovascular;  Laterality: N/A;  Rm 2029  . TEE WITHOUT CARDIOVERSION N/A 09/02/2012   Procedure: TRANSESOPHAGEAL ECHOCARDIOGRAM (TEE);  Surgeon: Josue Hector, MD;  Location: [place];  Service: Cardiovascular;  Laterality: N/A;  . TEE WITHOUT CARDIOVERSION N/A 11/13/2013   Procedure: TRANSESOPHAGEAL ECHOCARDIOGRAM (TEE);  Surgeon: [name], MD;  Location: [facility] ENDOSCOPY;  Service: Cardiovascular;  Laterality: N/A;    Current Outpatient Medications  Medication Sig Dispense Refill  . CARTIA XT 240 MG 24 hr capsule TAKE 1 CAPSULE(240 MG) BY MOUTH DAILY 90 capsule 2  . diclofenac sodium (VOLTAREN) 1 % GEL Apply 4 g topically 4 (four) times daily. 100 g 3  . Diclofenac-Misoprostol 75-0.2 MG TBEC TAKE 1 TABLET BY MOUTH TWICE DAILY 60 tablet 0  . diltiazem (CARDIZEM) 30 MG tablet Take 1 tablet (30 mg total) by mouth daily as needed. (Patient not taking: Reported on 12/18/2016) 30 tablet 5  . dofetilide (TIKOSYN) 250 MCG capsule TAKE 1 CAPSULE(250 MCG) BY MOUTH TWICE DAILY 180 capsule 2  . FLUoxetine (PROZAC) 20 MG tablet TAKE 1 TABLET(20 MG) BY MOUTH DAILY 90 tablet 1  . metoprolol succinate (TOPROL XL) 25 MG 24 hr tablet Take 0.5 tablets (12.5 mg total) by mouth daily. 30 tablet 6  . pantoprazole (PROTONIX) 40 MG tablet Take 1 tablet (40 mg total) by mouth daily. 30 tablet 3  . potassium chloride SA (K-DUR,KLOR-CON) 20 MEQ tablet Take 2 tablets (40 mEq total) 2 (two) times daily by mouth. 360 tablet 2  . PROVENTIL HFA 108 (90 Base) MCG/ACT inhaler INHALE 2 PUFFS INTO THE LUNGS EVERY 6 HOURS AS NEEDED 6.7 g 0  . XARELTO 20 MG TABS tablet TAKE 1 TABLET(20 MG) BY MOUTH DAILY WITH SUPPER 90 tablet 2   No current  facility-administered medications for this encounter.     Allergies  Allergen Reactions  . Adhesive [Tape] Itching and Rash    Paper tape is better but best to use no adhesive if possible    Social History   Socioeconomic History  . Marital status: Widowed    Spouse name: Not on file  . Number of children: 3  . Years of education: Not on file  . Highest education level: Not on file  Social Needs  . Financial resource strain: Not on file  . Food insecurity - worry: Not on file  . Food insecurity - inability: Not on file  . Transportation needs - medical: Not on file  . Transportation needs - non-medical: Not on file  Occupational History  . Not on file  Tobacco Use  . Smoking status:  Never Smoker  . Smokeless tobacco: Never Used  Substance and Sexual Activity  . Alcohol use: No    Comment: Rarely  . Drug use: No  . Sexual activity: No  Other Topics Concern  . Not on file  Social History Narrative   Marital status: widowed since 01/2014 of melanoma; not dating.       Children:  3 children (17,30, 32) and 4 grandchildren and 1 gg. 1 adopted child.       Lives with son.  Lives in [place].        Employment:  Unemployed.       Tobacco: none      Alcohol: none      Drugs: none      Exercise: none    Family History  Problem Relation Age of Onset  . Emphysema Mother   . COPD Mother   . Anxiety disorder Mother   . Cancer Father        lung cancer; non-smoker  . Heart disease Father 55       AMI  . COPD Sister   . Emphysema Brother   . Cancer Daughter   . Cancer Sister        lung cancer  . Heart disease Sister        AMI  . Diabetes Other   . Thyroid disease Other     ROS- All systems are reviewed and negative except as per the HPI above  Physical Exam: There were no vitals filed for this visit.  GEN- The patient is well appearing, alert and oriented x 3 today.   Head- normocephalic, atraumatic Eyes-  Sclera clear, conjunctiva pink Ears- hearing  intact Oropharynx- clear Neck- supple, no JVP Lymph- no cervical lymphadenopathy Lungs- Clear to ausculation bilaterally, normal work of breathing Heart- Regular rate and rhythm, no murmurs, rubs or gallops, PMI not laterally displaced GI- soft, NT, ND, + BS Extremities- no clubbing, cyanosis, or edema MS- no significant deformity or atrophy Skin- no rash or lesion Psych- euthymic mood, full affect Neuro- strength and sensation are intact  EKG- NSR at 63 bpm, pr int 176 ms, qrs int 96 ms, qtc 485 ms(stable) Epic records reviewed  Assessment and Plan: 1. Paroxysmal atrial fibrillation Maintaining  SR Continue metoprolol 12.5 mg at hs 30 mg Cardizem as needed for rate control for breakthrough afib  Continue Tikosyn 500 meq bid Continue eliquis 5 mg bid Bmet/mag today   F/u in 6 months  Butch Penny C. Ramiah Helfrich, [facility] [address] [place], [place] [address] [phone]

## 2017-06-15 ENCOUNTER — Ambulatory Visit (HOSPITAL_COMMUNITY)
Admission: RE | Admit: 2017-06-15 | Discharge: 2017-06-15 | Disposition: A | Discharge status: Home / Self Care | Payer: 59 | Source: Ambulatory Visit | Attending: Nurse Practitioner | Admitting: Nurse Practitioner

## 2017-06-15 DIAGNOSIS — I481 Persistent atrial fibrillation: Secondary | ICD-10-CM | POA: Insufficient documentation

## 2017-06-15 LAB — BASIC METABOLIC PANEL
ANION GAP: 9 (ref 5–15)
BUN: 11 mg/dL (ref 6–20)
CO2: 25 mmol/L (ref 22–32)
Calcium: 8.9 mg/dL (ref 8.9–10.3)
Chloride: 103 mmol/L (ref 101–111)
Creatinine, Ser: 0.83 mg/dL (ref 0.44–1.00)
GFR calc Af Amer: >60 mL/min (ref >60)
GLUCOSE: 79 mg/dL (ref 65–99)
POTASSIUM: 4 mmol/L (ref 3.5–5.1)
Sodium: 137 mmol/L (ref 135–145)

## 2017-07-18 ENCOUNTER — Other Ambulatory Visit (HOSPITAL_COMMUNITY): Payer: Self-pay | Admitting: Nurse Practitioner

## 2017-08-23 DIAGNOSIS — D62 Acute posthemorrhagic anemia: Secondary | ICD-10-CM | POA: Insufficient documentation

## 2017-08-23 DIAGNOSIS — K219 Gastro-esophageal reflux disease without esophagitis: Secondary | ICD-10-CM | POA: Insufficient documentation

## 2017-08-23 DIAGNOSIS — R1310 Dysphagia, unspecified: Secondary | ICD-10-CM | POA: Insufficient documentation

## 2017-08-23 DIAGNOSIS — K920 Hematemesis: Secondary | ICD-10-CM | POA: Insufficient documentation

## 2017-08-24 ENCOUNTER — Other Ambulatory Visit (HOSPITAL_COMMUNITY): Payer: Self-pay | Admitting: Nurse Practitioner

## 2017-09-18 ENCOUNTER — Other Ambulatory Visit (HOSPITAL_COMMUNITY): Payer: Self-pay | Admitting: Nurse Practitioner

## 2017-10-05 ENCOUNTER — Ambulatory Visit: Payer: 59 | Admitting: Physician Assistant

## 2017-10-05 ENCOUNTER — Encounter: Payer: Self-pay | Admitting: Physician Assistant

## 2017-10-05 ENCOUNTER — Ambulatory Visit (INDEPENDENT_AMBULATORY_CARE_PROVIDER_SITE_OTHER): Payer: 59

## 2017-10-05 VITALS — BP 130/68 | HR 58 | Temp 97.5°F | Resp 16 | Ht 60.5 in | Wt 193.2 lb

## 2017-10-05 DIAGNOSIS — M7989 Other specified soft tissue disorders: Secondary | ICD-10-CM | POA: Diagnosis not present

## 2017-10-05 DIAGNOSIS — D62 Acute posthemorrhagic anemia: Secondary | ICD-10-CM

## 2017-10-05 DIAGNOSIS — M19072 Primary osteoarthritis, left ankle and foot: Secondary | ICD-10-CM

## 2017-10-05 NOTE — Progress Notes (Signed)
Subjective:    Patient ID: Angela Cummings, female    DOB: [birth year], 62 y.o.   MRN: [id number] Chief Complaint  Patient presents with  . Foot Swelling    x 2 weeks wasn't hurting but now it is     HPI   62 yo female hx of afib presents for evaluation of left foot swelling. She was recently hospitalized for hematemesis which was diagnosed as a deep esophageal tear. Patient received Kcentra in the hospital and her Xarelto was held for one week post hospital stay. Restarted xarelto 09/01/17  She states her left foot has been swelling for a few months without any pain. In the past 2 weeks, the swelling has been associated with pain. She notices the pain worse at night while in bed. Has pain while walking but denies increased pain. Swelling has not increased since hospital stay.  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  She is very active with her grandchildren.  Denies any known episodes of afib while off xarelto, but unsure. Denies calf pain, SOB, chest pain, hemoptysis. Denies stasis, injury.  Review of Systems  Constitutional: Negative.   HENT: Negative.   Eyes: Negative.   Respiratory: Negative for apnea, cough, choking, chest tightness, shortness of breath, wheezing and stridor.   Cardiovascular: Negative for chest pain, palpitations and leg swelling.  Gastrointestinal: Negative for abdominal distention, abdominal pain, anal bleeding, blood in stool, constipation, diarrhea, nausea, rectal pain and vomiting.  Endocrine: Negative.   Genitourinary: Negative.   Musculoskeletal: Negative.   Allergic/Immunologic: Negative.   Neurological: Negative for dizziness, facial asymmetry, weakness, light-headedness, numbness and headaches.  Hematological: Negative.   Psychiatric/Behavioral: Negative.    Patient Active Problem List   Diagnosis Date Noted  . Foot swelling 10/05/2017  . Acute blood loss anemia 08/23/2017  .  GERD (gastroesophageal reflux disease) 08/23/2017  . Hematemesis 08/23/2017  . Odynophagia 08/23/2017  . Rotator cuff injury 02/17/2016  . Bursitis of right shoulder 11/07/2015  . Fibromyalgia 03/14/2015  . Degenerative disc disease, cervical 03/14/2015  . Primary osteoarthritis involving multiple joints 03/14/2015  . Depression 02/18/2014  . Chest pain 05/08/2013  . Shortness of breath 05/08/2013  . Hypokalemia 07/26/2012  . Asthma   . PAF (paroxysmal atrial fibrillation) ([place]) 11/28/2010  . Obesity 11/28/2010    Past Medical History:  Diagnosis Date  . Anxiety   . Asthma   . Atrial fibrillation ([place]) 11/2010   paroxysmal  . Bradycardia   . Depression   . DJD (degenerative joint disease)    Cervical spine  . Fibromyalgia    s/p rheumatology consultation/Zeminsky  . Mild sleep apnea   . Obesity   . Osteoarthritis    Shoulders, hips,knees    Prior to Admission medications   Medication Sig Start Date End Date Taking? Authorizing Provider  acetaminophen (TYLENOL) 500 MG tablet Take by mouth.    [provider]  diclofenac sodium (VOLTAREN) 1 % GEL Apply 4 g topically 4 (four) times daily. 12/30/15   Wardell Honour, MD  Diclofenac-Misoprostol 75-0.2 MG TBEC TAKE 1 TABLET BY MOUTH TWICE DAILY 11/25/15   Wardell Honour, MD  diltiazem [facility]) 240 MG 24 hr capsule TAKE ONE CAPSULE EVERY DAY 09/18/17   Sherran Needs, NP  dofetilide (TIKOSYN) 250 MCG capsule TAKE 1 CAPSULE(250 MCG) BY MOUTH TWICE DAILY 05/16/17   Sherran Needs, NP  FLUoxetine (PROZAC) 20  MG tablet TAKE 1 TABLET(20 MG) BY MOUTH DAILY 07/19/16   Jaynee Eagles, PA-C  metoprolol succinate (TOPROL-XL) 25 MG 24 hr tablet TAKE 1 TABLET EVERY DAY 07/18/17   Sherran Needs, NP  pantoprazole (PROTONIX) 40 MG tablet Take 1 tablet (40 mg total) by mouth daily. 05/29/17   Sherran Needs, NP  potassium chloride SA (K-DUR,KLOR-CON) 20 MEQ tablet Take 2 tablets (40 mEq total) 2 (two) times daily by mouth. 04/18/17    Sherran Needs, NP  PROVENTIL HFA 108 [id number] Base) MCG/ACT inhaler INHALE 2 PUFFS INTO THE LUNGS EVERY 6 HOURS AS NEEDED 02/16/16   Wardell Honour, MD  XARELTO 20 MG TABS tablet TAKE 1 TABLET EVERY DAY WITH SUPPER 07/18/17   Sherran Needs, NP    Allergies  Allergen Reactions  . Adhesive [Tape] Itching and Rash    Paper tape is better but best to use no adhesive if possible       Objective:   Physical Exam  Constitutional: She is oriented to person, place, and time. She appears well-developed and well-nourished. No distress.  BP 130/68   Pulse (!) 58   Temp (!) 97.5 F (36.4 C)   Resp 16   Ht 5' 0.5" (1.537 m)   Wt 193 lb 3.2 oz (87.6 kg)   SpO2 98%   BMI 37.11 kg/m    HENT:  Head: Normocephalic and atraumatic.  Eyes: EOM are normal. Right eye exhibits no discharge. Left eye exhibits no discharge.  Neck: Normal range of motion.  Cardiovascular: Normal rate, regular rhythm, normal heart sounds and intact distal pulses. Exam reveals no gallop and no friction rub.  No murmur heard. Pulses:      Radial pulses are 2+ on the right side, and 2+ on the left side.       Posterior tibial pulses are 2+ on the right side, and 2+ on the left side.  Pulmonary/Chest: Effort normal and breath sounds normal. No stridor. No respiratory distress. She has no wheezes. She has no rales. She exhibits no tenderness.  Abdominal: Soft. Bowel sounds are normal. She exhibits no distension and no mass. There is no tenderness. There is no rebound and no guarding. No hernia.  Musculoskeletal: She exhibits edema.       Right ankle: She exhibits swelling. She exhibits normal range of motion and no ecchymosis. No tenderness.       Left ankle: She exhibits decreased range of motion and swelling. She exhibits no ecchymosis, no deformity, no laceration and normal pulse. Tenderness.  Neurological: She is alert and oriented to person, place, and time.  Skin: Skin is warm, dry and intact. Capillary refill takes less  than 2 seconds. She is not diaphoretic. No cyanosis. No pallor. Nails show no clubbing.   Focused Exam: Negative Homan's Sign, bilaterally Edema bilateral, but L>R  Edema of L foot extends to mid shin. <2 second cap refill of all 5 digits bilaterally.     Assessment & Plan:  1. Foot swelling No findings consistent with DVT. Await radiographs, suspect osteoarthritis, may be associated with prolonged standing while working at [employer]. Recommend against oral NSAID due to xarelto. Will need to provide alternative pain tx once x-ray reviewed. - DG Foot Complete Left; Future  2. Acute blood loss anemia Symptomatically stable, did not have time to have blood drawn today. Encouraged to follow up with PCP and have CBC drawn at that time.  Return for hospital follow-up (esophageal tear) with Dr.  Smith.

## 2017-10-05 NOTE — Progress Notes (Signed)
Patient ID: Angela Cummings, female    DOB: Nov 17, 1955, 62 y.o.   MRN: [id number]  PCP: Wardell Honour, MD  Chief Complaint  Patient presents with  . Foot Swelling    x 2 weeks wasn't hurting but now it is     Subjective:   Presents for evaluation of swelling and pain of the left foot.    Swelling in the foot developed more than 1 month ago.  Initially it was nonpainful.  She was helping her husband at his furniture store and did more standing than usual.  2 weeks ago, she developed pain in the foot.  Pain is worse at night in bed.  Weightbearing and walking does not increase the pain.  Swelling has not worsened with the development of pain.  No other trauma or injury that she can identify, she is very active with her grandchildren.  It is notable that she was recently hospitalized due to a deep esophageal tear.  She presented to the emergency department via EMS due to hematemesis.  She received Kcentra and Xarelto was held during her hospitalization and 1 week post discharge.  She resumed Xarelto on 09/01/2017 for anticoagulation related to atrial fibrillation.  Is not aware of any episodes of A. fib while off Xarelto.  She denies chest pain, shortness of breath, pain into the lower legs, redness in the legs. No coughing.   Review of Systems Constitutional: Negative.   HENT: Negative.   Eyes: Negative.   Respiratory: Negative for apnea, cough, choking, chest tightness, shortness of breath, wheezing and stridor.   Cardiovascular: Negative for chest pain, palpitations and leg swelling.  Gastrointestinal: Negative for abdominal distention, abdominal pain, anal bleeding, blood in stool, constipation, diarrhea, nausea, rectal pain and vomiting.  Endocrine: Negative.   Genitourinary: Negative.   Musculoskeletal: Negative.   Allergic/Immunologic: Negative.   Neurological: Negative for dizziness, facial asymmetry, weakness, light-headedness, numbness and headaches.  Hematological:  Negative.   Psychiatric/Behavioral: Negative.        Patient Active Problem List   Diagnosis Date Noted  . Persistent atrial fibrillation ([place]) 06/22/2015  . Fibromyalgia 03/14/2015  . Degenerative disc disease, cervical 03/14/2015  . Primary osteoarthritis involving multiple joints 03/14/2015  . Situational depression 02/18/2014  . Chest pain 05/08/2013  . Shortness of breath 05/08/2013  . Hypokalemia 07/26/2012  . Atrial fibrillation with RVR ([place]) 03/09/2012  . Arthritis 03/09/2012  . Asthma   . Atrial fibrillation ([place]) 11/28/2010  . Obesity 11/28/2010     Prior to Admission medications   diltiazem (TIAZAC) 240 MG 24 hr capsule TAKE ONE CAPSULE EVERY DAY 09/18/17   Sherran Needs, NP  dofetilide (TIKOSYN) 250 MCG capsule TAKE 1 CAPSULE(250 MCG) BY MOUTH TWICE DAILY 05/16/17   Sherran Needs, NP  FLUoxetine (PROZAC) 20 MG tablet TAKE 1 TABLET(20 MG) BY MOUTH DAILY 07/19/16   Jaynee Eagles, PA-C  metoprolol succinate (TOPROL-XL) 25 MG 24 hr tablet TAKE 1 TABLET EVERY DAY 07/18/17   Sherran Needs, NP  pantoprazole (PROTONIX) 40 MG tablet Take 1 tablet (40 mg total) by mouth daily. 05/29/17   Sherran Needs, NP  potassium chloride SA (K-DUR,KLOR-CON) 20 MEQ tablet Take 2 tablets (40 mEq total) 2 (two) times daily by mouth. 04/18/17   Sherran Needs, NP  PROVENTIL HFA 108 [phone] Base) MCG/ACT inhaler INHALE 2 PUFFS INTO THE LUNGS EVERY 6 HOURS AS NEEDED 02/16/16   Wardell Honour, MD  XARELTO 20 MG TABS tablet TAKE  1 TABLET EVERY DAY WITH SUPPER 07/18/17   Sherran Needs, NP     Allergies  Allergen Reactions  . Adhesive [Tape] Itching and Rash    Paper tape is better but best to use no adhesive if possible       Objective:  Physical Exam  Constitutional: She is oriented to person, place, and time. She appears well-developed and well-nourished. She is active and cooperative. No distress.  BP 130/68   Pulse (!) 58   Temp (!) 97.5 F (36.4 C)   Resp 16   Ht 5' 0.5" (1.537  m)   Wt 193 lb 3.2 oz (87.6 kg)   SpO2 98%   BMI 37.11 kg/m   HENT:  Head: Normocephalic and atraumatic.  Right Ear: Hearing normal.  Left Ear: Hearing normal.  Eyes: Conjunctivae are normal. No scleral icterus.  Neck: Normal range of motion. Neck supple. No thyromegaly present.  Cardiovascular: Normal rate, regular rhythm and normal heart sounds.  Pulses:      Radial pulses are 2+ on the right side, and 2+ on the left side.  Pulmonary/Chest: Effort normal and breath sounds normal.  Musculoskeletal:       Right ankle: Normal. Achilles tendon normal.       Left ankle: Normal. Achilles tendon normal.       Right lower leg: She exhibits edema (trace pitting). She exhibits no tenderness, no bony tenderness, no swelling, no deformity and no laceration.       Left lower leg: She exhibits edema (trace). She exhibits no tenderness, no bony tenderness, no swelling, no deformity and no laceration.       Right foot: There is normal range of motion, no tenderness, no bony tenderness, no swelling, normal capillary refill, no crepitus and no laceration. Deformity: firm bony "mound" on the dorsum of the midfoot, following bone grafting procedure        Left foot: There is tenderness, swelling (trace) and deformity (firm bony "mound" on the dorsum of the midfoot, following bone grafting procedure, smaller than similar lesion on the RIGHT). There is normal range of motion, no bony tenderness, normal capillary refill, no crepitus and no laceration.  Lymphadenopathy:       Head (right side): No tonsillar, no preauricular, no posterior auricular and no occipital adenopathy present.       Head (left side): No tonsillar, no preauricular, no posterior auricular and no occipital adenopathy present.    She has no cervical adenopathy.       Right: No supraclavicular adenopathy present.       Left: No supraclavicular adenopathy present.  Neurological: She is alert and oriented to person, place, and time. No sensory  deficit.  Skin: Skin is warm, dry and intact. No rash noted. No cyanosis or erythema. Nails show no clubbing.  Psychiatric: She has a normal mood and affect. Her speech is normal and behavior is normal.        Assessment & Plan:   1. Foot swelling Reassuring exam, based on history was initially concern for DVT.  Await radiographs.  Will need alternative pain relief, has been using NSAIDs.  Would consider referral to podiatry if unable to achieve adequate pain relief. - DG Foot Complete Left; Future  2. Acute blood loss anemia Patient does not have time for labs today.  No subjective complaints consistent with persistent anemia.  She will schedule hospital follow-up with her primary care provider at her earliest convenience.    Return for hospital  follow-up (esophageal tear) with Dr. Tamala Julian.   Fara Chute, PA-C [facility]

## 2017-10-05 NOTE — Patient Instructions (Signed)
     IF you received an x-ray today, you will receive an invoice from [facility]. Please contact [facility] at [phone] with questions or concerns regarding your invoice.   IF you received labwork today, you will receive an invoice from [facility]. Please contact [facility] at [phone] with questions or concerns regarding your invoice.   Our billing staff will not be able to assist you with questions regarding bills from these companies.  You will be contacted with the lab results as soon as they are available. The fastest way to get your results is to activate your My Chart account. Instructions are located on the last page of this paperwork. If you have not heard from us regarding the results in 2 weeks, please contact this office.     

## 2017-10-06 ENCOUNTER — Encounter: Payer: Self-pay | Admitting: Physician Assistant

## 2017-10-06 DIAGNOSIS — M19072 Primary osteoarthritis, left ankle and foot: Secondary | ICD-10-CM | POA: Insufficient documentation

## 2017-10-06 MED ORDER — TRAMADOL HCL 50 MG PO TABS: 50.0000 mg | ORAL_TABLET | Freq: Three times a day (TID) | ORAL | 0 refills | Status: AC | PRN

## 2017-10-20 ENCOUNTER — Ambulatory Visit: Payer: 59 | Admitting: Family Medicine

## 2017-11-06 ENCOUNTER — Encounter: Payer: Self-pay | Admitting: Family Medicine

## 2018-01-04 ENCOUNTER — Ambulatory Visit (HOSPITAL_COMMUNITY)
Admission: RE | Admit: 2018-01-04 | Discharge: 2018-01-04 | Disposition: A | Discharge status: Home / Self Care | Payer: 59 | Source: Ambulatory Visit | Attending: Nurse Practitioner | Admitting: Nurse Practitioner

## 2018-01-04 ENCOUNTER — Encounter (HOSPITAL_COMMUNITY): Payer: Self-pay | Admitting: Nurse Practitioner

## 2018-01-04 VITALS — BP 122/72 | HR 126 | Ht 60.5 in | Wt 193.0 lb

## 2018-01-04 DIAGNOSIS — M797 Fibromyalgia: Secondary | ICD-10-CM | POA: Diagnosis not present

## 2018-01-04 DIAGNOSIS — E669 Obesity, unspecified: Secondary | ICD-10-CM | POA: Insufficient documentation

## 2018-01-04 DIAGNOSIS — K223 Perforation of esophagus: Secondary | ICD-10-CM | POA: Diagnosis not present

## 2018-01-04 DIAGNOSIS — Z888 Allergy status to other drugs, medicaments and biological substances status: Secondary | ICD-10-CM | POA: Insufficient documentation

## 2018-01-04 DIAGNOSIS — F419 Anxiety disorder, unspecified: Secondary | ICD-10-CM | POA: Insufficient documentation

## 2018-01-04 DIAGNOSIS — M47812 Spondylosis without myelopathy or radiculopathy, cervical region: Secondary | ICD-10-CM | POA: Insufficient documentation

## 2018-01-04 DIAGNOSIS — I480 Paroxysmal atrial fibrillation: Secondary | ICD-10-CM | POA: Diagnosis present

## 2018-01-04 DIAGNOSIS — Z79899 Other long term (current) drug therapy: Secondary | ICD-10-CM | POA: Diagnosis not present

## 2018-01-04 DIAGNOSIS — Z6837 Body mass index (BMI) 37.0-37.9, adult: Secondary | ICD-10-CM | POA: Insufficient documentation

## 2018-01-04 DIAGNOSIS — Z801 Family history of malignant neoplasm of trachea, bronchus and lung: Secondary | ICD-10-CM | POA: Diagnosis not present

## 2018-01-04 DIAGNOSIS — Z833 Family history of diabetes mellitus: Secondary | ICD-10-CM | POA: Insufficient documentation

## 2018-01-04 LAB — CBC
HEMATOCRIT: 39.9 % (ref 36.0–46.0)
Hemoglobin: 12.6 g/dL (ref 12.0–15.0)
MCH: 27.2 pg (ref 26.0–34.0)
MCHC: 31.6 g/dL (ref 30.0–36.0)
MCV: 86 fL (ref 78.0–100.0)
PLATELETS: 256 10*3/uL (ref 150–400)
RBC: 4.64 MIL/uL (ref 3.87–5.11)
RDW: 13.9 % (ref 11.5–15.5)
WBC: 5.7 10*3/uL (ref 4.0–10.5)

## 2018-01-04 LAB — BASIC METABOLIC PANEL
Anion gap: 7 (ref 5–15)
BUN: 12 mg/dL (ref 8–23)
CHLORIDE: 109 mmol/L (ref 98–111)
CO2: 23 mmol/L (ref 22–32)
CREATININE: 0.87 mg/dL (ref 0.44–1.00)
Calcium: 9.1 mg/dL (ref 8.9–10.3)
Glucose, Bld: 98 mg/dL (ref 70–99)
Potassium: 4.7 mmol/L (ref 3.5–5.1)
SODIUM: 139 mmol/L (ref 135–145)

## 2018-01-04 LAB — MAGNESIUM: MAGNESIUM: 2.2 mg/dL (ref 1.7–2.4)

## 2018-01-04 MED ORDER — METOPROLOL SUCCINATE ER 25 MG PO TB24: 25.0000 mg | ORAL_TABLET | Freq: Two times a day (BID) | ORAL | 3 refills | Status: DC

## 2018-01-04 MED ORDER — PANTOPRAZOLE SODIUM 40 MG PO TBEC: 40.0000 mg | DELAYED_RELEASE_TABLET | Freq: Two times a day (BID) | ORAL | 3 refills | Status: DC

## 2018-01-04 NOTE — Progress Notes (Signed)
Patient ID: Angela Cummings, female   DOB: May 04, 1956, 62 y.o.   MRN: [id number]     Primary Care Physician: Wardell Honour, MD Referring Physician: [facility] f/u Cardiologist: Dr. [name] is a 62 y.o. female with a h/o persistent afib recently failing Rythmol and admitted for tikosyn load 1/10 through 06/25/15. She required dose adjustment to 250 mg bid due to prolonged qtc. Labs were stable. She returned  1/20 saying that she felt better and heart is staying in rhythm other than 20 mins of afib that resolved after taking her am medicine. Qtc stable.   2/17- She returns to afib clinic for [place] f/u. She had a bad day on Tuesday, feels like she may have been in afib. She is in SR today. Otherwise no complaints.   Return 5/12- in afib clinic for f/u tikosyn. She feels well, no awareness of afib. Taking tikosyn and blood thinner on a regular basis. No bleeding issues.  F/u in afib clinic- 8/11- Pt is in afib today with rvr. States needs shoulder surgery due to rotator cuff problems, and is under stress raising her great granddaughter. She feels as though she has been having more afib later. Is taking her tikosyn on a regular basis.  In afib clinic for f/u today. She had a successful cardioversion in August. She has been staying in [place]. Continues on tikosyn.No issues with DOAC. Qtc stable.  Asked to be seen today 06/15/16 in the afib clinic for not feeling well. She was sure if she was in afib. She did run out of metoprolol over the holidays and was off drug for a few days. Her heart rate and BP are lower today than usual but she is in SR.  F/u afib clinic 3/1. She is staying in [facility] on [address] and [address]. Is having some pain with  left knee and has noted some swelling of left ankle. Fears arthritis is worsening in her knee. Plans to discuss with orthopedic surgeon when she f/u's up on rt shoulder surgery.  F/u afib clinic 12/18/16. She has been feeling well. No recent afib.  She is compliant with tikosyn and apixaban. No bleeding issues.  F/u in afib clinic for Tikosyn 12/19. She continues on Tikosyn and is not having any afib. She is being compliant  with xarelto. No bleeding issues. Stays busy raising 3 grandchildren. She is having some issues swallowing food items and is seeing an ENT this week.  F/u in afib clinic, 7/26. She reports that in March, she started vomiting blood repeatedly and was hospitalized in [place]. She had upper endoscopy with esophageal tear being found and was cauterized. She has not had any further bleeding but was on Protonix x 3 months , recently ran out and her symptoms of feeling like she is having difficulty swallowing have returned. She does not have a f/u with the GI group that treated her. She is also in afib this am with RVR, feels that she went in this am. She is taking Tikosyn without any missed doses.  Today, she denies symptoms of palpitations, chest pain, shortness of breath, orthopnea, PND, lower extremity edema, dizziness, presyncope, syncope, or neurologic sequela.+ for  The patient is tolerating medications without difficulties and is otherwise without complaint today.   Past Medical History:  Diagnosis Date  . Anxiety   . Asthma   . Atrial fibrillation ([place]) 11/2010   paroxysmal  . Bradycardia   . Depression   . DJD (  degenerative joint disease)    Cervical spine  . Fibromyalgia    s/p rheumatology consultation/[facility]  . Mild sleep apnea   . Obesity   . Osteoarthritis    Shoulders, hips,knees   Past Surgical History:  Procedure Laterality Date  . ABLATION  08/2012   PVI by Dr [name]  . ABLATION  11/13/2013   PVI by Dr [name]  . ATRIAL FIBRILLATION ABLATION N/A 09/03/2012   Procedure: ATRIAL FIBRILLATION ABLATION;  Surgeon: Thompson Grayer, MD;  Location: [facility] CATH LAB;  Service: Cardiovascular;  Laterality: N/A;  . ATRIAL FIBRILLATION ABLATION N/A 11/13/2013   Procedure: ATRIAL FIBRILLATION ABLATION;  Surgeon: Coralyn Mark, MD;  Location: [facility] CATH LAB;  Service: Cardiovascular;  Laterality: N/A;  . CARDIOVERSION  03/13/2012   Procedure: CARDIOVERSION;  Surgeon: [name], MD;  Location: [facility] ENDOSCOPY;  Service: Cardiovascular;  Laterality: N/A;  . CARDIOVERSION N/A 07/26/2012   Procedure: CARDIOVERSION;  Surgeon: Thompson Grayer, MD;  Location: [facility];  Service: Cardiovascular;  Laterality: N/A;  . CARDIOVERSION N/A 02/20/2014   Procedure: CARDIOVERSION;  Surgeon: Larey Dresser, MD;  Location: [facility];  Service: Cardiovascular;  Laterality: N/A;  . CESAREAN SECTION    . CHOLECYSTECTOMY    . HERNIA REPAIR    . KNEE SURGERY    . neck tumor resection     benign  . TEE WITHOUT CARDIOVERSION  03/13/2012   Procedure: TRANSESOPHAGEAL ECHOCARDIOGRAM (TEE);  Surgeon: [name], MD;  Location: [facility];  Service: Cardiovascular;  Laterality: N/A;  Rm 2029  . TEE WITHOUT CARDIOVERSION N/A 09/02/2012   Procedure: TRANSESOPHAGEAL ECHOCARDIOGRAM (TEE);  Surgeon: Josue Hector, MD;  Location: [facility];  Service: Cardiovascular;  Laterality: N/A;  . TEE WITHOUT CARDIOVERSION N/A 11/13/2013   Procedure: TRANSESOPHAGEAL ECHOCARDIOGRAM (TEE);  Surgeon: [name], MD;  Location: [facility] ENDOSCOPY;  Service: Cardiovascular;  Laterality: N/A;    Current Outpatient Medications  Medication Sig Dispense Refill  . acetaminophen (TYLENOL) 500 MG tablet Take by mouth.    . diclofenac sodium (VOLTAREN) 1 % GEL Apply 4 g topically 4 (four) times daily. 100 g 3  . Diclofenac-Misoprostol 75-0.2 MG TBEC TAKE 1 TABLET BY MOUTH TWICE DAILY 60 tablet 0  . diltiazem (TIAZAC) 240 MG 24 hr capsule TAKE ONE CAPSULE EVERY DAY 30 capsule 6  . dofetilide (TIKOSYN) 250 MCG capsule TAKE 1 CAPSULE(250 MCG) BY MOUTH TWICE DAILY 180 capsule 2  . FLUoxetine (PROZAC) 20 MG tablet TAKE 1 TABLET(20 MG) BY MOUTH DAILY 90 tablet 1  . metoprolol succinate (TOPROL-XL) 25 MG 24 hr tablet Take 1 tablet (25 mg total) by mouth 2 (two) times  daily. 60 tablet 3  . pantoprazole (PROTONIX) 40 MG tablet Take 1 tablet (40 mg total) by mouth 2 (two) times daily. 60 tablet 3  . potassium chloride SA (K-DUR,KLOR-CON) 20 MEQ tablet Take 2 tablets (40 mEq total) 2 (two) times daily by mouth. 360 tablet 2  . PROVENTIL HFA 108 (90 Base) MCG/ACT inhaler INHALE 2 PUFFS INTO THE LUNGS EVERY 6 HOURS AS NEEDED 6.7 g 0  . XARELTO 20 MG TABS tablet TAKE 1 TABLET EVERY DAY WITH SUPPER 30 tablet 6   No current facility-administered medications for this encounter.     Allergies  Allergen Reactions  . Adhesive [Tape] Itching and Rash    Paper tape is better but best to use no adhesive if possible    Social History   Socioeconomic History  . Marital status: Widowed  Spouse name: Not on file  . Number of children: 3  . Years of education: Not on file  . Highest education level: Not on file  Occupational History  . Not on file  Social Needs  . Financial resource strain: Not on file  . Food insecurity:    Worry: Not on file    Inability: Not on file  . Transportation needs:    Medical: Not on file    Non-medical: Not on file  Tobacco Use  . Smoking status: Never Smoker  . Smokeless tobacco: Never Used  Substance and Sexual Activity  . Alcohol use: No    Comment: Rarely  . Drug use: No  . Sexual activity: Never  Lifestyle  . Physical activity:    Days per week: Not on file    Minutes per session: Not on file  . Stress: Not on file  Relationships  . Social connections:    Talks on phone: Not on file    Gets together: Not on file    Attends religious service: Not on file    Active member of club or organization: Not on file    Attends meetings of clubs or organizations: Not on file    Relationship status: Not on file  . Intimate partner violence:    Fear of current or ex partner: Not on file    Emotionally abused: Not on file    Physically abused: Not on file    Forced sexual activity: Not on file  Other Topics Concern  .  Not on file  Social History Narrative   Marital status: widowed since 01/2014 of melanoma; not dating.       Children:  3 children (17,30, 32) and 4 grandchildren and 1 gg. 1 adopted child.       Lives with son.  Lives in [place].        Employment:  Unemployed.       Tobacco: none      Alcohol: none      Drugs: none      Exercise: none    Family History  Problem Relation Age of Onset  . Emphysema Mother   . COPD Mother   . Anxiety disorder Mother   . Cancer Father        lung cancer; non-smoker  . Heart disease Father 52       AMI  . COPD Sister   . Emphysema Brother   . Cancer Daughter   . Cancer Sister        lung cancer  . Heart disease Sister        AMI  . Diabetes Other   . Thyroid disease Other     ROS- All systems are reviewed and negative except as per the HPI above  Physical Exam: Vitals:   01/04/18 1049  BP: 122/72  Pulse: (!) 126  Weight: 193 lb (87.5 kg)  Height: 5' 0.5" (1.537 m)    GEN- The patient is well appearing, alert and oriented x 3 today.   Head- normocephalic, atraumatic Eyes-  Sclera clear, conjunctiva pink Ears- hearing intact Oropharynx- clear Neck- supple, no JVP Lymph- no cervical lymphadenopathy Lungs- Clear to ausculation bilaterally, normal work of breathing Heart- irregular rate and rhythm, no murmurs, rubs or gallops, PMI not laterally displaced GI- soft, NT, ND, + BS Extremities- no clubbing, cyanosis, or edema MS- no significant deformity or atrophy Skin- no rash or lesion Psych- euthymic mood, full affect Neuro- strength and sensation are  intact  EKG- afib at 126 ms, qrs int 106 ms, qtc 506 ms Epic records reviewed  Assessment and Plan: 1. Paroxysmal atrial fibrillation Maintaining  SR, but in afib this am with RVR Increase  Metoprolol ER to 25 mg bid 30 mg Cardizem as needed for rate control for breakthrough afib  Continue Tikosyn 500 meq bid Continue eliquis 5 mg bid  Bmet/mag today  2. Esophagusal tear  s/p cauterization 08/2017 Pt was on PPI's x 3 months and has bee off around a month  and feel some of her initial symptoms of dysphagia have returned Will give her 3 month refill on her PPI( no refills) and have asked her to f/u with her GI MD    F/u next week for EKG  Angela Cummings, [facility] [address] [place], [place] [address] [phone]

## 2018-01-04 NOTE — Patient Instructions (Signed)
Increase metoprolol to 25mg twice a day

## 2018-01-10 ENCOUNTER — Other Ambulatory Visit (HOSPITAL_COMMUNITY): Payer: Self-pay | Admitting: Nurse Practitioner

## 2018-01-11 ENCOUNTER — Ambulatory Visit (HOSPITAL_COMMUNITY)
Admission: RE | Admit: 2018-01-11 | Discharge: 2018-01-11 | Disposition: A | Discharge status: Home / Self Care | Payer: 59 | Source: Ambulatory Visit | Attending: Nurse Practitioner | Admitting: Nurse Practitioner

## 2018-01-11 DIAGNOSIS — Z79899 Other long term (current) drug therapy: Secondary | ICD-10-CM | POA: Insufficient documentation

## 2018-01-11 DIAGNOSIS — I480 Paroxysmal atrial fibrillation: Secondary | ICD-10-CM | POA: Insufficient documentation

## 2018-01-11 NOTE — Progress Notes (Addendum)
Pt in for repeat EKG.   To be reviewed by Roderic Palau, NP   Back in SR at 49 bpm, Qtc stable at 460 ms, with increase to metoprolol 25 mg bid. She feels a little sluggish but feels recently has had mare afib burden and feels sluggish in afib as well. Continue with increase in BB. If symptomatic with brady, let me know. May need to see Dr. [name] for consideration for 3rd ablation if SR cannot be maintained or does not tolerate increase in BB long term. Otherwise see me back in 3 months.

## 2018-01-18 ENCOUNTER — Other Ambulatory Visit (HOSPITAL_COMMUNITY): Payer: Self-pay | Admitting: Nurse Practitioner

## 2018-02-25 ENCOUNTER — Other Ambulatory Visit (HOSPITAL_COMMUNITY): Payer: Self-pay | Admitting: Nurse Practitioner

## 2018-04-17 ENCOUNTER — Other Ambulatory Visit (HOSPITAL_COMMUNITY): Payer: Self-pay | Admitting: Nurse Practitioner

## 2018-05-28 ENCOUNTER — Encounter (HOSPITAL_COMMUNITY): Payer: Self-pay | Admitting: Nurse Practitioner

## 2018-05-28 ENCOUNTER — Ambulatory Visit (HOSPITAL_COMMUNITY)
Admission: RE | Admit: 2018-05-28 | Discharge: 2018-05-28 | Disposition: A | Discharge status: Home / Self Care | Payer: Self-pay | Source: Ambulatory Visit | Attending: Nurse Practitioner | Admitting: Nurse Practitioner

## 2018-05-28 VITALS — BP 118/74 | HR 55 | Ht 60.5 in | Wt 194.0 lb

## 2018-05-28 DIAGNOSIS — I480 Paroxysmal atrial fibrillation: Secondary | ICD-10-CM | POA: Insufficient documentation

## 2018-05-28 DIAGNOSIS — M797 Fibromyalgia: Secondary | ICD-10-CM | POA: Diagnosis not present

## 2018-05-28 DIAGNOSIS — Z79899 Other long term (current) drug therapy: Secondary | ICD-10-CM | POA: Insufficient documentation

## 2018-05-28 DIAGNOSIS — Z7901 Long term (current) use of anticoagulants: Secondary | ICD-10-CM | POA: Insufficient documentation

## 2018-05-28 DIAGNOSIS — R001 Bradycardia, unspecified: Secondary | ICD-10-CM | POA: Insufficient documentation

## 2018-05-28 LAB — BASIC METABOLIC PANEL
ANION GAP: 10 (ref 5–15)
BUN: 12 mg/dL (ref 8–23)
CALCIUM: 8.7 mg/dL — AB (ref 8.9–10.3)
CO2: 23 mmol/L (ref 22–32)
Chloride: 105 mmol/L (ref 98–111)
Creatinine, Ser: 0.85 mg/dL (ref 0.44–1.00)
GFR calc Af Amer: >60 mL/min (ref >60)
GLUCOSE: 95 mg/dL (ref 70–99)
Potassium: 4 mmol/L (ref 3.5–5.1)
Sodium: 138 mmol/L (ref 135–145)

## 2018-05-28 LAB — MAGNESIUM: Magnesium: 2.3 mg/dL (ref 1.7–2.4)

## 2018-05-28 NOTE — Progress Notes (Signed)
Patient ID: Angela Cummings, female   DOB: 14-Jul-1955, 62 y.o.   MRN: [id number]     Primary Care Physician: No primary care provider on file. Referring Physician: [facility] f/u Cardiologist: Dr. [name] is a 62 y.o. female with a h/o persistent afib recently failing Rythmol and admitted for tikosyn load 1/10 through 06/25/15. She required dose adjustment to 250 mg bid due to prolonged qtc. Labs were stable. She returned  1/20 saying that she felt better and heart is staying in rhythm other than 20 mins of afib that resolved after taking her am medicine. Qtc stable.   2/17- She returns to afib clinic for [place] f/u. She had a bad day on Tuesday, feels like she may have been in afib. She is in SR today. Otherwise no complaints.   Return 5/12- in afib clinic for f/u tikosyn. She feels well, no awareness of afib. Taking tikosyn and blood thinner on a regular basis. No bleeding issues.  F/u in afib clinic- 8/11- Pt is in afib today with rvr. States needs shoulder surgery due to rotator cuff problems, and is under stress raising her great granddaughter. She feels as though she has been having more afib later. Is taking her tikosyn on a regular basis.  In afib clinic for f/u today. She had a successful cardioversion in August. She has been staying in [place]. Continues on tikosyn.No issues with DOAC. Qtc stable.  Asked to be seen today 06/15/16 in the afib clinic for not feeling well. She was sure if she was in afib. She did run out of metoprolol over the holidays and was off drug for a few days. Her heart rate and BP are lower today than usual but she is in SR.  F/u afib clinic 3/1. She is staying in [facility] on [address] and [employer]. Is having some pain with  left knee and has noted some swelling of left ankle. Fears arthritis is worsening in her knee. Plans to discuss with orthopedic surgeon when she f/u's up on rt shoulder surgery.  F/u afib clinic 12/18/16. She has been feeling well.  No recent afib. She is compliant with tikosyn and apixaban. No bleeding issues.  F/u in afib clinic for Tikosyn 12/19. She continues on Tikosyn and is not having any afib. She is being compliant  with xarelto. No bleeding issues. Stays busy raising 3 grandchildren. She is having some issues swallowing food items and is seeing an ENT this week.  F/u in afib clinic, 7/26. She reports that in March, she started vomiting blood repeatedly and was hospitalized in [place]. She had upper endoscopy with esophageal tear being found and was cauterized. She has not had any further bleeding but was on Protonix x 3 months , recently ran out and her symptoms of feeling like she is having difficulty swallowing have returned. She does not have a f/u with the GI group that treated her. She is also in afib this am with RVR, feels that she went in this am. She is taking Tikosyn without any missed doses.  F/u 12/17. She is in SR, reports some afib over the weekend, goes by spurts.Otherwise, no issues to report.  Today, she denies symptoms of palpitations, chest pain, shortness of breath, orthopnea, PND, lower extremity edema, dizziness, presyncope, syncope, or neurologic sequela.  The patient is tolerating medications without difficulties and is otherwise without complaint today.   Past Medical History:  Diagnosis Date  . Anxiety   . Asthma   .  Atrial fibrillation ([place]) 11/2010   paroxysmal  . Bradycardia   . Depression   . DJD (degenerative joint disease)    Cervical spine  . Fibromyalgia    s/p rheumatology consultation/Z[facility]  . Mild sleep apnea   . Obesity   . Osteoarthritis    Shoulders, hips,knees   Past Surgical History:  Procedure Laterality Date  . ABLATION  08/2012   PVI by Dr [name]  . ABLATION  11/13/2013   PVI by Dr [name]  . ATRIAL FIBRILLATION ABLATION N/A 09/03/2012   Procedure: ATRIAL FIBRILLATION ABLATION;  Surgeon: Thompson Grayer, MD;  Location: [facility] CATH LAB;  Service: Cardiovascular;   Laterality: N/A;  . ATRIAL FIBRILLATION ABLATION N/A 11/13/2013   Procedure: ATRIAL FIBRILLATION ABLATION;  Surgeon: Coralyn Mark, MD;  Location: [place] CATH LAB;  Service: Cardiovascular;  Laterality: N/A;  . CARDIOVERSION  03/13/2012   Procedure: CARDIOVERSION;  Surgeon: [name], MD;  Location: [facility] ENDOSCOPY;  Service: Cardiovascular;  Laterality: N/A;  . CARDIOVERSION N/A 07/26/2012   Procedure: CARDIOVERSION;  Surgeon: Thompson Grayer, MD;  Location: [place];  Service: Cardiovascular;  Laterality: N/A;  . CARDIOVERSION N/A 02/20/2014   Procedure: CARDIOVERSION;  Surgeon: Larey Dresser, MD;  Location: [place];  Service: Cardiovascular;  Laterality: N/A;  . CESAREAN SECTION    . CHOLECYSTECTOMY    . HERNIA REPAIR    . KNEE SURGERY    . neck tumor resection     benign  . TEE WITHOUT CARDIOVERSION  03/13/2012   Procedure: TRANSESOPHAGEAL ECHOCARDIOGRAM (TEE);  Surgeon: [name], MD;  Location: [place];  Service: Cardiovascular;  Laterality: N/A;  Rm 2029  . TEE WITHOUT CARDIOVERSION N/A 09/02/2012   Procedure: TRANSESOPHAGEAL ECHOCARDIOGRAM (TEE);  Surgeon: Josue Hector, MD;  Location: [place];  Service: Cardiovascular;  Laterality: N/A;  . TEE WITHOUT CARDIOVERSION N/A 11/13/2013   Procedure: TRANSESOPHAGEAL ECHOCARDIOGRAM (TEE);  Surgeon: [name], MD;  Location[facility] ENDOSCOPY;  Service: Cardiovascular;  Laterality: N/A;    Current Outpatient Medications  Medication Sig Dispense Refill  . acetaminophen (TYLENOL) 500 MG tablet Take by mouth.    . diclofenac sodium (VOLTAREN) 1 % GEL Apply 4 g topically 4 (four) times daily. 100 g 3  . Diclofenac-Misoprostol 75-0.2 MG TBEC TAKE 1 TABLET BY MOUTH TWICE DAILY 60 tablet 0  . diltiazem (TIAZAC) 240 MG 24 hr capsule TAKE ONE CAPSULE EVERY DAY 90 capsule 2  . dofetilide (TIKOSYN) 250 MCG capsule TAKE 1 CAPSULE(250 MCG) BY MOUTH TWICE DAILY 60 capsule 8  . FLUoxetine (PROZAC) 20 MG tablet TAKE 1 TABLET(20 MG) BY MOUTH  DAILY 90 tablet 1  . KLOR-CON M20 20 MEQ tablet TAKE 2 TABLETS (40 MEQ TOTAL) 2 (TWO) TIMES DAILY BY MOUTH. 360 tablet 2  . metoprolol succinate (TOPROL-XL) 25 MG 24 hr tablet Take 1 tablet (25 mg total) by mouth 2 (two) times daily. 60 tablet 3  . pantoprazole (PROTONIX) 40 MG tablet TAKE 1 TABLET BY MOUTH TWICE A DAY 60 tablet 3  . PROVENTIL HFA 108 (90 Base) MCG/ACT inhaler INHALE 2 PUFFS INTO THE LUNGS EVERY 6 HOURS AS NEEDED 6.7 g 0  . XARELTO 20 MG TABS tablet TAKE 1 TABLET EVERY DAY WITH SUPPER 90 tablet 2   No current facility-administered medications for this encounter.     Allergies  Allergen Reactions  . Adhesive [Tape] Itching and Rash    Paper tape is better but best to use no adhesive if possible    Social  History   Socioeconomic History  . Marital status: Widowed    Spouse name: Not on file  . Number of children: 3  . Years of education: Not on file  . Highest education level: Not on file  Occupational History  . Not on file  Social Needs  . Financial resource strain: Not on file  . Food insecurity:    Worry: Not on file    Inability: Not on file  . Transportation needs:    Medical: Not on file    Non-medical: Not on file  Tobacco Use  . Smoking status: Never Smoker  . Smokeless tobacco: Never Used  Substance and Sexual Activity  . Alcohol use: No    Comment: Rarely  . Drug use: No  . Sexual activity: Never  Lifestyle  . Physical activity:    Days per week: Not on file    Minutes per session: Not on file  . Stress: Not on file  Relationships  . Social connections:    Talks on phone: Not on file    Gets together: Not on file    Attends religious service: Not on file    Active member of club or organization: Not on file    Attends meetings of clubs or organizations: Not on file    Relationship status: Not on file  . Intimate partner violence:    Fear of current or ex partner: Not on file    Emotionally abused: Not on file    Physically abused:  Not on file    Forced sexual activity: Not on file  Other Topics Concern  . Not on file  Social History Narrative   Marital status: widowed since 01/2014 of melanoma; not dating.       Children:  3 children (17,30, 32) and 4 grandchildren and 1 gg. 1 adopted child.       Lives with son.  Lives in [place].        Employment:  Unemployed.       Tobacco: none      Alcohol: none      Drugs: none      Exercise: none    Family History  Problem Relation Age of Onset  . Emphysema Mother   . COPD Mother   . Anxiety disorder Mother   . Cancer Father        lung cancer; non-smoker  . Heart disease Father 65       AMI  . COPD Sister   . Emphysema Brother   . Cancer Daughter   . Cancer Sister        lung cancer  . Heart disease Sister        AMI  . Diabetes Other   . Thyroid disease Other     ROS- All systems are reviewed and negative except as per the HPI above  Physical Exam: Vitals:   05/28/18 1158  BP: 118/74  Pulse: (!) 55  Weight: 88 kg  Height: 5' 0.5" (1.537 m)    GEN- The patient is well appearing, alert and oriented x 3 today.   Head- normocephalic, atraumatic Eyes-  Sclera clear, conjunctiva pink Ears- hearing intact Oropharynx- clear Neck- supple, no JVP Lymph- no cervical lymphadenopathy Lungs- Clear to ausculation bilaterally, normal work of breathing Heart- regular rate and rhythm, no murmurs, rubs or gallops, PMI not laterally displaced GI- soft, NT, ND, + BS Extremities- no clubbing, cyanosis, or edema MS- no significant deformity or atrophy Skin- no rash or  lesion Psych- euthymic mood, full affect Neuro- strength and sensation are intact  EKG-  Sinus brady at 55 bpm, pr int 166 ms, qrs int 104 ms, qtc 470 ms Epic records reviewed  Assessment and Plan: 1. Paroxysmal atrial fibrillation Maintaining SR, but with bouts of afib She doe snot want to try increase of rate control at this point Continue Metoprolol ER 25 mg bid 30 mg Cardizem as  needed for rate control for breakthrough afib  Continue Tikosyn 250 meq bid Continue xarelto 20 mg qd Bmet/mag today  2. Esophageal tear s/p cauterization 08/2017 Stable, no further issues    F/u in 6 months in afib clinic  Yoshi Mancillas C. Abenezer Odonell, [facility] [address] [place], [place] [address] [phone]

## 2018-05-30 ENCOUNTER — Other Ambulatory Visit (HOSPITAL_COMMUNITY): Payer: Self-pay | Admitting: Nurse Practitioner

## 2018-06-07 NOTE — Addendum Note (Signed)
Encounter addended by: Sherran Needs, NP on: 06/07/2018 8:43 AM  Actions taken: LOS modified

## 2018-07-02 ENCOUNTER — Other Ambulatory Visit (HOSPITAL_COMMUNITY): Payer: Self-pay | Admitting: Nurse Practitioner

## 2018-08-01 ENCOUNTER — Other Ambulatory Visit (HOSPITAL_COMMUNITY): Payer: Self-pay | Admitting: *Deleted

## 2018-08-01 MED ORDER — DILTIAZEM HCL ER BEADS 240 MG PO CP24: 240.0000 mg | ORAL_CAPSULE | Freq: Every day | ORAL | 0 refills | Status: DC

## 2018-08-01 MED FILL — DILT-XR 240 MG CAP SA: 240 | 10 days supply | Qty: 10 | Fill #0

## 2018-09-02 ENCOUNTER — Telehealth (HOSPITAL_COMMUNITY): Payer: Self-pay | Admitting: Licensed Clinical Social Worker

## 2018-09-02 NOTE — Telephone Encounter (Signed)
CSW received referral from Afib clinic to provide supportive grief counseling. Patient contacted the clinic for support. CSW contacted patient who states her 65yo son died at [facility] from cardiac arrest after a bout with an underling condition on 08/06/18. Patient states he cared for another gentleman who has ALS and is morbidly obese and patient feels her son put his own health on hold to care for this gentleman. Patient also mentioned that she lost her husband and feeling overwhelmed with grief issues. Patient states she is not eating or sleeping well and "I can't stop crying". Patient states "I thought it would get easier with time. It has been almost a month and I should be better". CSW provided supportive intervention and tried to normalize her feelings. Patient is overwhelmed with grief and agreeable to grief counseling. CSW referred patient to [facility] for one on one counseling as well as some support group options. Patient aware of possible delay due to Covid-19 outbreak but will follow up by phone. CSW offered continued availability as needed for phone support. Patient grateful for support and resources. Raquel Sarna, [place], [place]

## 2018-09-06 ENCOUNTER — Telehealth (INDEPENDENT_AMBULATORY_CARE_PROVIDER_SITE_OTHER): Payer: Medicare Other | Admitting: Emergency Medicine

## 2018-09-06 ENCOUNTER — Other Ambulatory Visit: Payer: Self-pay

## 2018-09-06 DIAGNOSIS — F418 Other specified anxiety disorders: Secondary | ICD-10-CM | POA: Diagnosis not present

## 2018-09-06 DIAGNOSIS — F419 Anxiety disorder, unspecified: Secondary | ICD-10-CM | POA: Insufficient documentation

## 2018-09-06 MED ORDER — LORAZEPAM 1 MG PO TABS: 1.0000 mg | ORAL_TABLET | Freq: Two times a day (BID) | ORAL | 1 refills | Status: DC | PRN

## 2018-09-06 NOTE — Progress Notes (Signed)
Anxiety and depression for she has recently lost her son this past month.   PHQ9=21

## 2018-09-06 NOTE — Progress Notes (Signed)
Telemedicine Encounter- SOAP NOTE Established Patient  This telephone encounter was conducted with the patient's (or proxy's) verbal consent via audio telecommunications: yes/no: Yes Patient was instructed to have this encounter in a suitably private space; and to only have persons present to whom they give permission to participate. In addition, patient identity was confirmed by use of name plus two identifiers (DOB and address).  I discussed the limitations, risks, security and privacy concerns of performing an evaluation and management service by telephone and the availability of in person appointments. I also discussed with the patient that there may be a patient responsible charge related to this service. The patient expressed understanding and agreed to proceed.  I spent a total of TIME; 0 MIN TO 60 MIN: 20 minutes talking with the patient or their proxy.  No chief complaint on file. Acute anxiety   Subjective   Angela Cummings is a 63 y.o. female established patient.  First visit with me.  Used to see Dr. Tamala Julian.  Telephone visit today for acute anxiety generated by recent death of her 26 year old son.  Having hard time coping with the situation.  Feels medication might help and also requesting counseling.  Not eating well and not sleeping.  HPI   Patient Active Problem List   Diagnosis Date Noted  . Osteoarthritis of left foot 10/06/2017  . GERD (gastroesophageal reflux disease) 08/23/2017  . Hematemesis 08/23/2017  . Fibromyalgia 03/14/2015  . Degenerative disc disease, cervical 03/14/2015  . Primary osteoarthritis involving multiple joints 03/14/2015  . Depression 02/18/2014  . Asthma   . PAF (paroxysmal atrial fibrillation) ([place]) 11/28/2010  . Obesity 11/28/2010    Past Medical History:  Diagnosis Date  . Anxiety   . Asthma   . Atrial fibrillation ([place]) 11/2010   paroxysmal  . Bradycardia   . Depression   . DJD (degenerative joint disease)    Cervical spine   . Fibromyalgia    s/p rheumatology consultation/[facility]  . Mild sleep apnea   . Obesity   . Osteoarthritis    Shoulders, hips,knees    Current Outpatient Medications  Medication Sig Dispense Refill  . acetaminophen (TYLENOL) 500 MG tablet Take by mouth.    . Diclofenac-Misoprostol 75-0.2 MG TBEC TAKE 1 TABLET BY MOUTH TWICE DAILY 60 tablet 0  . diltiazem (TIAZAC) 240 MG 24 hr capsule Take 1 capsule (240 mg total) by mouth daily. 10 capsule 0  . dofetilide (TIKOSYN) 250 MCG capsule TAKE 1 CAPSULE(250 MCG) BY MOUTH TWICE DAILY 180 capsule 2  . KLOR-CON M20 20 MEQ tablet TAKE 2 TABLETS (40 MEQ TOTAL) 2 (TWO) TIMES DAILY BY MOUTH. 360 tablet 2  . metoprolol succinate (TOPROL-XL) 25 MG 24 hr tablet Take 1 tablet (25 mg total) by mouth 2 (two) times daily. 60 tablet 3  . pantoprazole (PROTONIX) 40 MG tablet TAKE 1 TABLET BY MOUTH TWICE A DAY 180 tablet 1  . PROVENTIL HFA 108 (90 Base) MCG/ACT inhaler INHALE 2 PUFFS INTO THE LUNGS EVERY 6 HOURS AS NEEDED 6.7 g 0  . XARELTO 20 MG TABS tablet TAKE 1 TABLET EVERY DAY WITH SUPPER 90 tablet 2  . diclofenac sodium (VOLTAREN) 1 % GEL Apply 4 g topically 4 (four) times daily. (Patient not taking: Reported on 09/06/2018) 100 g 3  . FLUoxetine (PROZAC) 20 MG tablet TAKE 1 TABLET(20 MG) BY MOUTH DAILY (Patient not taking: Reported on 09/06/2018) 90 tablet 1   No current facility-administered medications for this visit.  Allergies  Allergen Reactions  . Adhesive [Tape] Itching and Rash    Paper tape is better but best to use no adhesive if possible    Social History   Socioeconomic History  . Marital status: Widowed    Spouse name: Not on file  . Number of children: 3  . Years of education: Not on file  . Highest education level: Not on file  Occupational History  . Not on file  Social Needs  . Financial resource strain: Not on file  . Food insecurity:    Worry: Not on file    Inability: Not on file  . Transportation needs:    Medical:  Not on file    Non-medical: Not on file  Tobacco Use  . Smoking status: Never Smoker  . Smokeless tobacco: Never Used  Substance and Sexual Activity  . Alcohol use: No    Comment: Rarely  . Drug use: No  . Sexual activity: Never  Lifestyle  . Physical activity:    Days per week: Not on file    Minutes per session: Not on file  . Stress: Not on file  Relationships  . Social connections:    Talks on phone: Not on file    Gets together: Not on file    Attends religious service: Not on file    Active member of club or organization: Not on file    Attends meetings of clubs or organizations: Not on file    Relationship status: Not on file  . Intimate partner violence:    Fear of current or ex partner: Not on file    Emotionally abused: Not on file    Physically abused: Not on file    Forced sexual activity: Not on file  Other Topics Concern  . Not on file  Social History Narrative   Marital status: widowed since 01/2014 of melanoma; not dating.       Children:  3 children (17,30, 32) and 4 grandchildren and 1 gg. 1 adopted child.       Lives with son.  Lives in [place].        Employment:  Unemployed.       Tobacco: none      Alcohol: none      Drugs: none      Exercise: none    Review of Systems  Constitutional: Negative for fever.  HENT: Negative for sore throat.   Eyes: Negative for discharge and redness.  Respiratory: Negative for cough and shortness of breath.   Cardiovascular: Negative for chest pain and palpitations.  Gastrointestinal: Negative for abdominal pain, diarrhea, nausea and vomiting.  Musculoskeletal: Negative for myalgias.  Psychiatric/Behavioral: Negative for suicidal ideas. The patient is nervous/anxious and has insomnia.   All other systems reviewed and are negative.   Objective   Vitals as reported by the patient: None available There were no vitals filed for this visit.  There are no diagnoses linked to this encounter.   Diagnoses and  all orders for this visit:  Situational anxiety -     LORazepam (ATIVAN) 1 MG tablet; Take 1 tablet (1 mg total) by mouth 2 (two) times daily as needed for anxiety. -     Ambulatory referral to Psychiatry  Acute anxiety -     Ambulatory referral to Psychiatry    I discussed the assessment and treatment plan with the patient. The patient was provided an opportunity to ask questions and all were answered. The patient agreed with the  plan and demonstrated an understanding of the instructions.   The patient was advised to call back or seek an in-person evaluation if the symptoms worsen or if the condition fails to improve as anticipated.  I provided 20 minutes of non-face-to-face time during this encounter.  Horald Pollen, MD  Primary Care at [facility]

## 2018-09-16 ENCOUNTER — Telehealth (HOSPITAL_COMMUNITY): Payer: Self-pay | Admitting: Licensed Clinical Social Worker

## 2018-09-16 NOTE — Telephone Encounter (Signed)
CSW attempted to contact patient to follow up on call from last week. Unable to leave message as no voicemail set up. Raquel Sarna, [place], [place]

## 2018-10-24 ENCOUNTER — Emergency Department (HOSPITAL_COMMUNITY): Payer: Medicare Other

## 2018-10-24 ENCOUNTER — Encounter (HOSPITAL_COMMUNITY): Payer: Self-pay

## 2018-10-24 ENCOUNTER — Other Ambulatory Visit: Payer: Self-pay

## 2018-10-24 ENCOUNTER — Emergency Department (HOSPITAL_COMMUNITY)
Admission: EM | Admit: 2018-10-24 | Discharge: 2018-10-24 | Disposition: A | Discharge status: Home / Self Care | Payer: Medicare Other | Attending: Emergency Medicine | Admitting: Emergency Medicine

## 2018-10-24 ENCOUNTER — Ambulatory Visit (HOSPITAL_BASED_OUTPATIENT_CLINIC_OR_DEPARTMENT_OTHER)
Admission: RE | Admit: 2018-10-24 | Discharge: 2018-10-24 | Disposition: A | Discharge status: Home / Self Care | Payer: Medicare Other | Source: Ambulatory Visit | Attending: Nurse Practitioner | Admitting: Nurse Practitioner

## 2018-10-24 VITALS — BP 148/90 | HR 172 | Ht 60.5 in | Wt 192.6 lb

## 2018-10-24 DIAGNOSIS — M797 Fibromyalgia: Secondary | ICD-10-CM | POA: Insufficient documentation

## 2018-10-24 DIAGNOSIS — J45909 Unspecified asthma, uncomplicated: Secondary | ICD-10-CM | POA: Insufficient documentation

## 2018-10-24 DIAGNOSIS — F329 Major depressive disorder, single episode, unspecified: Secondary | ICD-10-CM | POA: Insufficient documentation

## 2018-10-24 DIAGNOSIS — Z7901 Long term (current) use of anticoagulants: Secondary | ICD-10-CM | POA: Insufficient documentation

## 2018-10-24 DIAGNOSIS — I4892 Unspecified atrial flutter: Secondary | ICD-10-CM

## 2018-10-24 DIAGNOSIS — M1990 Unspecified osteoarthritis, unspecified site: Secondary | ICD-10-CM | POA: Insufficient documentation

## 2018-10-24 DIAGNOSIS — G4730 Sleep apnea, unspecified: Secondary | ICD-10-CM | POA: Insufficient documentation

## 2018-10-24 DIAGNOSIS — R42 Dizziness and giddiness: Secondary | ICD-10-CM | POA: Insufficient documentation

## 2018-10-24 DIAGNOSIS — Z79899 Other long term (current) drug therapy: Secondary | ICD-10-CM | POA: Insufficient documentation

## 2018-10-24 DIAGNOSIS — Z6836 Body mass index (BMI) 36.0-36.9, adult: Secondary | ICD-10-CM | POA: Insufficient documentation

## 2018-10-24 DIAGNOSIS — I4891 Unspecified atrial fibrillation: Secondary | ICD-10-CM | POA: Diagnosis not present

## 2018-10-24 DIAGNOSIS — E669 Obesity, unspecified: Secondary | ICD-10-CM | POA: Insufficient documentation

## 2018-10-24 DIAGNOSIS — F419 Anxiety disorder, unspecified: Secondary | ICD-10-CM | POA: Insufficient documentation

## 2018-10-24 DIAGNOSIS — Z801 Family history of malignant neoplasm of trachea, bronchus and lung: Secondary | ICD-10-CM | POA: Insufficient documentation

## 2018-10-24 DIAGNOSIS — Z8249 Family history of ischemic heart disease and other diseases of the circulatory system: Secondary | ICD-10-CM | POA: Insufficient documentation

## 2018-10-24 DIAGNOSIS — Z825 Family history of asthma and other chronic lower respiratory diseases: Secondary | ICD-10-CM | POA: Insufficient documentation

## 2018-10-24 DIAGNOSIS — R000 Tachycardia, unspecified: Secondary | ICD-10-CM | POA: Diagnosis present

## 2018-10-24 DIAGNOSIS — I480 Paroxysmal atrial fibrillation: Secondary | ICD-10-CM

## 2018-10-24 DIAGNOSIS — R0602 Shortness of breath: Secondary | ICD-10-CM | POA: Insufficient documentation

## 2018-10-24 LAB — CBC WITH DIFFERENTIAL/PLATELET
Abs Immature Granulocytes: 0.01 10*3/uL (ref 0.00–0.07)
Basophils Absolute: 0.1 10*3/uL (ref 0.0–0.1)
Basophils Relative: 1 %
Eosinophils Absolute: 0.2 10*3/uL (ref 0.0–0.5)
Eosinophils Relative: 3 %
HCT: 41.6 % (ref 36.0–46.0)
Hemoglobin: 12.9 g/dL (ref 12.0–15.0)
Immature Granulocytes: 0 %
Lymphocytes Relative: 36 %
Lymphs Abs: 2.5 10*3/uL (ref 0.7–4.0)
MCH: 26.3 pg (ref 26.0–34.0)
MCHC: 31 g/dL (ref 30.0–36.0)
MCV: 84.7 fL (ref 80.0–100.0)
Monocytes Absolute: 0.9 10*3/uL (ref 0.1–1.0)
Monocytes Relative: 12 %
Neutro Abs: 3.4 10*3/uL (ref 1.7–7.7)
Neutrophils Relative %: 48 %
Platelets: 272 10*3/uL (ref 150–400)
RBC: 4.91 MIL/uL (ref 3.87–5.11)
RDW: 14.5 % (ref 11.5–15.5)
WBC: 7 10*3/uL (ref 4.0–10.5)
nRBC: 0 % (ref 0.0–0.2)

## 2018-10-24 LAB — BASIC METABOLIC PANEL
Anion gap: 10 (ref 5–15)
BUN: 9 mg/dL (ref 8–23)
CO2: 22 mmol/L (ref 22–32)
Calcium: 9.2 mg/dL (ref 8.9–10.3)
Chloride: 107 mmol/L (ref 98–111)
Creatinine, Ser: 0.99 mg/dL (ref 0.44–1.00)
GFR calc Af Amer: >60 mL/min (ref >60)
GFR calc non Af Amer: 60 mL/min — ABNORMAL LOW (ref >60)
Glucose, Bld: 96 mg/dL (ref 70–99)
Potassium: 4.1 mmol/L (ref 3.5–5.1)
Sodium: 139 mmol/L (ref 135–145)

## 2018-10-24 LAB — TROPONIN I: Troponin I: <0.03 ng/mL (ref <0.03)

## 2018-10-24 LAB — MAGNESIUM: Magnesium: 2.2 mg/dL (ref 1.7–2.4)

## 2018-10-24 MED ORDER — PROPOFOL 10 MG/ML IV BOLUS
0.5000 mg/kg | Freq: Once | INTRAVENOUS | Status: DC
  Filled 2018-10-24: qty 20

## 2018-10-24 MED ORDER — ADENOSINE 6 MG/2ML IV SOLN
INTRAVENOUS | Status: AC
  Filled 2018-10-24: qty 2

## 2018-10-24 MED ORDER — PROPOFOL 10 MG/ML IV BOLUS
INTRAVENOUS | Status: AC | PRN
  Administered 2018-10-24: 40 mg via INTRAVENOUS
  Administered 2018-10-24: 10 mg via INTRAVENOUS

## 2018-10-24 NOTE — ED Notes (Signed)
This RN acting as Art therapist and asked pt if could call any family/friends to update them on her care. Pt asked if I would call Melissa, her dtr [phone]). I called Melissa and was able to tell her that they are preparing to cardiovert her mother and that I will update her once the procedure is complete.

## 2018-10-24 NOTE — ED Notes (Signed)
Patient verbalizes understanding of discharge instructions. Opportunity for questioning and answers were provided. Armband removed by staff, pt discharged from ED. Pt wheeled to lobby and taken home by family 

## 2018-10-24 NOTE — Sedation Documentation (Signed)
XR at bedside

## 2018-10-24 NOTE — Progress Notes (Signed)
Primary Care Physician: Patient, No Pcp Per Referring Physician: Dr. [name] is a 63 y.o. female with a h/o paroxysmal afib that is in the afib clinic for being out of rhythm x one week. She has had increased stress with the death of a 40 yo son in the last several months and her daughter had to be hospitalized  over the last week and she had the sole responsibility of taking care of her 3 grandchildren. She is exhausted/lightheaded today and EKG shows atrial  flutter at 172 bpm.    Today, she denies symptoms of palpitations, chest pain, shortness of breath, orthopnea, PND, lower extremity edema, dizziness, presyncope, syncope, or neurologic sequela. The patient is tolerating medications without difficulties and is otherwise without complaint today.   Past Medical History:  Diagnosis Date  . Anxiety   . Asthma   . Atrial fibrillation ([place]) 11/2010   paroxysmal  . Bradycardia   . Depression   . DJD (degenerative joint disease)    Cervical spine  . Fibromyalgia    s/p rheumatology consultation/Zeminsky  . Mild sleep apnea   . Obesity   . Osteoarthritis    Shoulders, hips,knees   Past Surgical History:  Procedure Laterality Date  . ABLATION  08/2012   PVI by Dr [name]  . ABLATION  11/13/2013   PVI by Dr [name]  . ATRIAL FIBRILLATION ABLATION N/A 09/03/2012   Procedure: ATRIAL FIBRILLATION ABLATION;  Surgeon: Thompson Grayer, MD;  Location: [facility] CATH LAB;  Service: Cardiovascular;  Laterality: N/A;  . ATRIAL FIBRILLATION ABLATION N/A 11/13/2013   Procedure: ATRIAL FIBRILLATION ABLATION;  Surgeon: Coralyn Mark, MD;  Location: [facility] CATH LAB;  Service: Cardiovascular;  Laterality: N/A;  . CARDIOVERSION  03/13/2012   Procedure: CARDIOVERSION;  Surgeon: [name], MD;  Location: [facility] ENDOSCOPY;  Service: Cardiovascular;  Laterality: N/A;  . CARDIOVERSION N/A 07/26/2012   Procedure: CARDIOVERSION;  Surgeon: Thompson Grayer, MD;  Location: [place];  Service: Cardiovascular;   Laterality: N/A;  . CARDIOVERSION N/A 02/20/2014   Procedure: CARDIOVERSION;  Surgeon: Larey Dresser, MD;  Location: [place];  Service: Cardiovascular;  Laterality: N/A;  . CESAREAN SECTION    . CHOLECYSTECTOMY    . HERNIA REPAIR    . KNEE SURGERY    . neck tumor resection     benign  . TEE WITHOUT CARDIOVERSION  03/13/2012   Procedure: TRANSESOPHAGEAL ECHOCARDIOGRAM (TEE);  Surgeon: [name], MD;  Location: [place];  Service: Cardiovascular;  Laterality: N/A;  Rm 2029  . TEE WITHOUT CARDIOVERSION N/A 09/02/2012   Procedure: TRANSESOPHAGEAL ECHOCARDIOGRAM (TEE);  Surgeon: Josue Hector, MD;  Location: [place];  Service: Cardiovascular;  Laterality: N/A;  . TEE WITHOUT CARDIOVERSION N/A 11/13/2013   Procedure: TRANSESOPHAGEAL ECHOCARDIOGRAM (TEE);  Surgeon: [name], MD;  Location: [facility] ENDOSCOPY;  Service: Cardiovascular;  Laterality: N/A;    Current Outpatient Medications  Medication Sig Dispense Refill  . acetaminophen (TYLENOL) 500 MG tablet Take by mouth.    . diltiazem (TIAZAC) 240 MG 24 hr capsule Take 1 capsule (240 mg total) by mouth daily. 10 capsule 0  . dofetilide (TIKOSYN) 250 MCG capsule TAKE 1 CAPSULE(250 MCG) BY MOUTH TWICE DAILY 180 capsule 2  . FLUoxetine (PROZAC) 20 MG tablet TAKE 1 TABLET(20 MG) BY MOUTH DAILY 90 tablet 1  . KLOR-CON M20 20 MEQ tablet TAKE 2 TABLETS (40 MEQ TOTAL) 2 (TWO) TIMES DAILY BY MOUTH. 360 tablet 2  . metoprolol succinate (TOPROL-XL)  25 MG 24 hr tablet Take 1 tablet (25 mg total) by mouth 2 (two) times daily. 60 tablet 3  . pantoprazole (PROTONIX) 40 MG tablet TAKE 1 TABLET BY MOUTH TWICE A DAY 180 tablet 1  . PROVENTIL HFA 108 (90 Base) MCG/ACT inhaler INHALE 2 PUFFS INTO THE LUNGS EVERY 6 HOURS AS NEEDED 6.7 g 0  . XARELTO 20 MG TABS tablet TAKE 1 TABLET EVERY DAY WITH SUPPER 90 tablet 2  . Diclofenac-Misoprostol 75-0.2 MG TBEC TAKE 1 TABLET BY MOUTH TWICE DAILY (Patient not taking: Reported on 10/24/2018) 60 tablet 0   . LORazepam (ATIVAN) 1 MG tablet Take 1 tablet (1 mg total) by mouth 2 (two) times daily as needed for anxiety. (Patient not taking: Reported on 10/24/2018) 30 tablet 1   No current facility-administered medications for this encounter.     Allergies  Allergen Reactions  . Adhesive [Tape] Itching and Rash    Paper tape is better but best to use no adhesive if possible    Social History   Socioeconomic History  . Marital status: Widowed    Spouse name: Not on file  . Number of children: 3  . Years of education: Not on file  . Highest education level: Not on file  Occupational History  . Not on file  Social Needs  . Financial resource strain: Not on file  . Food insecurity:    Worry: Not on file    Inability: Not on file  . Transportation needs:    Medical: Not on file    Non-medical: Not on file  Tobacco Use  . Smoking status: Never Smoker  . Smokeless tobacco: Never Used  Substance and Sexual Activity  . Alcohol use: No    Comment: Rarely  . Drug use: No  . Sexual activity: Never  Lifestyle  . Physical activity:    Days per week: Not on file    Minutes per session: Not on file  . Stress: Not on file  Relationships  . Social connections:    Talks on phone: Not on file    Gets together: Not on file    Attends religious service: Not on file    Active member of club or organization: Not on file    Attends meetings of clubs or organizations: Not on file    Relationship status: Not on file  . Intimate partner violence:    Fear of current or ex partner: Not on file    Emotionally abused: Not on file    Physically abused: Not on file    Forced sexual activity: Not on file  Other Topics Concern  . Not on file  Social History Narrative   Marital status: widowed since 01/2014 of melanoma; not dating.       Children:  3 children (17,30, 32) and 4 grandchildren and 1 gg. 1 adopted child.       Lives with son.  Lives in [place].        Employment:  Unemployed.        Tobacco: none      Alcohol: none      Drugs: none      Exercise: none    Family History  Problem Relation Age of Onset  . Emphysema Mother   . COPD Mother   . Anxiety disorder Mother   . Cancer Father        lung cancer; non-smoker  . Heart disease Father 49       AMI  .  COPD Sister   . Emphysema Brother   . Cancer Daughter   . Cancer Sister        lung cancer  . Heart disease Sister        AMI  . Diabetes Other   . Thyroid disease Other     ROS- All systems are reviewed and negative except as per the HPI above  Physical Exam: Vitals:   10/24/18 1134  BP: (!) 148/90  Pulse: (!) 172  Weight: 87.4 kg  Height: 5' 0.5" (1.537 m)   Wt Readings from Last 3 Encounters:  10/24/18 87.4 kg  05/28/18 88 kg  01/04/18 87.5 kg    Labs: Lab Results  Component Value Date   NA 138 05/28/2018   K 4.0 05/28/2018   CL 105 05/28/2018   CO2 23 05/28/2018   GLUCOSE 95 05/28/2018   BUN 12 05/28/2018   CREATININE 0.85 05/28/2018   CALCIUM 8.7 (L) 05/28/2018   MG 2.3 05/28/2018   Lab Results  Component Value Date   INR 1.05 07/25/2012   Lab Results  Component Value Date   CHOL 169 03/14/2015   HDL 71 03/14/2015   LDLCALC 79 03/14/2015   TRIG 94 03/14/2015     GEN- The patient is well appearing, alert and oriented x 3 today.   Head- normocephalic, atraumatic Eyes-  Sclera clear, conjunctiva pink Ears- hearing intact Oropharynx- clear Neck- supple, no JVP Lymph- no cervical lymphadenopathy Lungs- Clear to ausculation bilaterally, normal work of breathing Heart- Rapid regular rate and rhythm, no murmurs, rubs or gallops, PMI not laterally displaced GI- soft, NT, ND, + BS Extremities- no clubbing, cyanosis, or edema MS- no significant deformity or atrophy Skin- no rash or lesion Psych- euthymic mood, full affect Neuro- strength and sensation are intact  EKG- rapid atrial flutter at 172 bpm Epic records reviewed    Assessment and Plan: 1. Rapid atrial  flutter x one week Significant recent familial stress  She continues on tiksoyn 250 mcg bid and dilt 240 mg daily as well as metoprolol succinate 25 mg bid She has not missed any of her xarelto 20 mg daily for the last  3 weeks I feel that she is at risk for decompensation with her very elevated heart rate and pt has agreed to go to the ER for urgent cardioversion  F/u hre in 2 weeks  Butch Penny C. Neill Jurewicz, [facility] [address] [place],  [address] [phone]

## 2018-10-24 NOTE — ED Notes (Addendum)
Pt confirmed her ride is on the way. Granddaughter, [place], and daughter, Lenna Sciara, on her way

## 2018-10-24 NOTE — ED Provider Notes (Signed)
.  Cardioversion Date/Time: 10/24/2018 1:00 PM Performed by: Pattricia Boss, MD Authorized by: Pattricia Boss, MD   Consent:    Consent obtained:  Verbal   Consent given by:  Patient   Risks discussed:  Cutaneous burn, death, induced arrhythmia and pain   Alternatives discussed:  No treatment Pre-procedure details:    Cardioversion basis:  Emergent   Rhythm:  Atrial fibrillation   Electrode placement:  Anterior-posterior Patient sedated: Yes. Refer to sedation procedure documentation for details of sedation.  Attempt one:    Cardioversion mode:  Synchronous   Waveform:  Biphasic   Shock (Joules):  200   Shock outcome:  Conversion to normal sinus rhythm Post-procedure details:    Patient status:  Alert   Patient tolerance of procedure:  Tolerated well, no immediate complications Comments:     Repeat ekg with nsr        Pattricia Boss, MD 10/24/18 1301

## 2018-10-24 NOTE — ED Notes (Addendum)
Per MD, pt placed on zoll pad, suction at bedside, 2 IV placed, pt placed on 2L [place] with Co2 monitoring. Consents at bedside.Respiratory notified.

## 2018-10-24 NOTE — ED Notes (Signed)
This RN called pt's Dtr, Melissa and updated her that pt's procedure went well. Pt will contact her once she has d/c orders. Melissa will come to pick her up.

## 2018-10-24 NOTE — ED Notes (Addendum)
Pt ambulated independently with RN as stand by assist. Pt reports feeling steady on feet. Pt drank PO fluids without troubles.

## 2018-10-24 NOTE — ED Notes (Signed)
Pt family arrived to pick up pt, Angela Cummings,NT wheeled pt to lobby. Pt ambulatory at d/c, wheeled to lobby for comfort.

## 2018-10-24 NOTE — ED Triage Notes (Addendum)
Pt arrives from A fib clinic, reports waking up and feeling dizzy and slightly SOB. Pt HR inf 170's. Pt alert and oriented. Pt denies pain. Pt on xarelto.

## 2018-10-24 NOTE — ED Notes (Signed)
ED Provider at bedside. 

## 2018-10-24 NOTE — ED Notes (Signed)
Consents placed with medical records

## 2018-10-24 NOTE — Sedation Documentation (Addendum)
Synchronized shock delivered at 200 joules.

## 2018-10-24 NOTE — ED Notes (Signed)
Pt given oral fluids at this time.

## 2018-10-24 NOTE — ED Provider Notes (Signed)
[facility] EMERGENCY DEPARTMENT Provider Note   CSN: [id number] Arrival date & time: 10/24/18  1155    History   Chief Complaint Chief Complaint  Patient presents with  . Tachycardia    HPI Angela Cummings is a 63 y.o. female.     The history is provided by the patient and medical records. No language interpreter was used.   Angela Cummings is a 63 y.o. female who presents to the Emergency Department complaining of tachycardia. Presents to the emergency department upon referral from the atrial fibrillation clinic for evaluation of tachycardia. She has a history of atrial fibrillation status post ablation and cardioversion multiple times. Over the last week she has experienced dizziness, shortness of breath and palpitations. She has been overwhelmed recently because her daughter in law has been in the hospital and she has been caring for her grandchildren. No known COVID contacts. She went to the a fib clinic and was noted to be tachycardic to the 170s and was referred to the emergency department for further evaluation. She has been compliant with her medications. No recent medication changes.  Past Medical History:  Diagnosis Date  . Anxiety   . Asthma   . Atrial fibrillation ([place]) 11/2010   paroxysmal  . Bradycardia   . Depression   . DJD (degenerative joint disease)    Cervical spine  . Fibromyalgia    s/p rheumatology consultation/Zeminsky  . Mild sleep apnea   . Obesity   . Osteoarthritis    Shoulders, hips,knees    Patient Active Problem List   Diagnosis Date Noted  . Acute anxiety 09/06/2018  . Osteoarthritis of left foot 10/06/2017  . GERD (gastroesophageal reflux disease) 08/23/2017  . Hematemesis 08/23/2017  . Fibromyalgia 03/14/2015  . Degenerative disc disease, cervical 03/14/2015  . Primary osteoarthritis involving multiple joints 03/14/2015  . Depression 02/18/2014  . Asthma   . PAF (paroxysmal atrial fibrillation) ([place])  11/28/2010  . Obesity 11/28/2010    Past Surgical History:  Procedure Laterality Date  . ABLATION  08/2012   PVI by Dr [name]  . ABLATION  11/13/2013   PVI by Dr [name]  . ATRIAL FIBRILLATION ABLATION N/A 09/03/2012   Procedure: ATRIAL FIBRILLATION ABLATION;  Surgeon: Thompson Grayer, MD;  Location: [facility] CATH LAB;  Service: Cardiovascular;  Laterality: N/A;  . ATRIAL FIBRILLATION ABLATION N/A 11/13/2013   Procedure: ATRIAL FIBRILLATION ABLATION;  Surgeon: Coralyn Mark, MD;  Location: [place] CATH LAB;  Service: Cardiovascular;  Laterality: N/A;  . CARDIOVERSION  03/13/2012   Procedure: CARDIOVERSION;  Surgeon: [name], MD;  Location: [facility] ENDOSCOPY;  Service: Cardiovascular;  Laterality: N/A;  . CARDIOVERSION N/A 07/26/2012   Procedure: CARDIOVERSION;  Surgeon: Thompson Grayer, MD;  Location: [place];  Service: Cardiovascular;  Laterality: N/A;  . CARDIOVERSION N/A 02/20/2014   Procedure: CARDIOVERSION;  Surgeon: Larey Dresser, MD;  Location: [place];  Service: Cardiovascular;  Laterality: N/A;  . CESAREAN SECTION    . CHOLECYSTECTOMY    . HERNIA REPAIR    . KNEE SURGERY    . neck tumor resection     benign  . TEE WITHOUT CARDIOVERSION  03/13/2012   Procedure: TRANSESOPHAGEAL ECHOCARDIOGRAM (TEE);  Surgeon: [name], MD;  Location: [place];  Service: Cardiovascular;  Laterality: N/A;  Rm 2029  . TEE WITHOUT CARDIOVERSION N/A 09/02/2012   Procedure: TRANSESOPHAGEAL ECHOCARDIOGRAM (TEE);  Surgeon: Josue Hector, MD;  Location: [facility] ENDOSCOPY;  Service: Cardiovascular;  Laterality: N/A;  . TEE  WITHOUT CARDIOVERSION N/A 11/13/2013   Procedure: TRANSESOPHAGEAL ECHOCARDIOGRAM (TEE);  Surgeon: [name], MD;  Location: [facility] ENDOSCOPY;  Service: Cardiovascular;  Laterality: N/A;     OB History   No obstetric history on file.      Home Medications    Prior to Admission medications   Medication Sig Start Date End Date Taking? Authorizing Provider  acetaminophen (TYLENOL) 500 MG  tablet Take by mouth.    [provider]  Diclofenac-Misoprostol 75-0.2 MG TBEC TAKE 1 TABLET BY MOUTH TWICE DAILY Patient not taking: Reported on 10/24/2018 11/25/15   Wardell Honour, MD  diltiazem [facility]) 240 MG 24 hr capsule Take 1 capsule (240 mg total) by mouth daily. 08/01/18   Sherran Needs, NP  dofetilide (TIKOSYN) 250 MCG capsule TAKE 1 CAPSULE(250 MCG) BY MOUTH TWICE DAILY 07/02/18   Sherran Needs, NP  FLUoxetine (PROZAC) 20 MG tablet TAKE 1 TABLET(20 MG) BY MOUTH DAILY 07/19/16   Jaynee Eagles, PA-C  KLOR-CON M20 20 MEQ tablet TAKE 2 TABLETS (40 MEQ TOTAL) 2 (TWO) TIMES DAILY BY MOUTH. 01/18/18   Sherran Needs, NP  LORazepam (ATIVAN) 1 MG tablet Take 1 tablet (1 mg total) by mouth 2 (two) times daily as needed for anxiety. Patient not taking: Reported on 10/24/2018 09/06/18   Horald Pollen, MD  metoprolol succinate (TOPROL-XL) 25 MG 24 hr tablet Take 1 tablet (25 mg total) by mouth 2 (two) times daily. 01/04/18   Sherran Needs, NP  pantoprazole (PROTONIX) 40 MG tablet TAKE 1 TABLET BY MOUTH TWICE A DAY 05/30/18   Sherran Needs, NP  PROVENTIL HFA [phone] Base) MCG/ACT inhaler INHALE 2 PUFFS INTO THE LUNGS EVERY 6 HOURS AS NEEDED 02/16/16   Wardell Honour, MD  XARELTO 20 MG TABS tablet TAKE 1 TABLET EVERY DAY WITH SUPPER 01/10/18   Sherran Needs, NP    Family History Family History  Problem Relation Age of Onset  . Emphysema Mother   . COPD Mother   . Anxiety disorder Mother   . Cancer Father        lung cancer; non-smoker  . Heart disease Father 58       AMI  . COPD Sister   . Emphysema Brother   . Cancer Daughter   . Cancer Sister        lung cancer  . Heart disease Sister        AMI  . Diabetes Other   . Thyroid disease Other     Social History Social History   Tobacco Use  . Smoking status: Never Smoker  . Smokeless tobacco: Never Used  Substance Use Topics  . Alcohol use: No    Comment: Rarely  . Drug use: No     Allergies    Adhesive [tape]   Review of Systems Review of Systems  All other systems reviewed and are negative.    Physical Exam Updated Vital Signs BP 120/65   Pulse 63   Temp 97.8 F (36.6 C) (Oral)   Resp 17   Ht 5\' 1"  (1.549 m)   Wt 87.1 kg   SpO2 98%   BMI 36.28 kg/m   Physical Exam Vitals signs and nursing note reviewed.  Constitutional:      Appearance: She is well-developed.  HENT:     Head: Normocephalic and atraumatic.  Cardiovascular:     Heart sounds: No murmur.     Comments: Tachcyardic, irregular Pulmonary:  Effort: Pulmonary effort is normal. No respiratory distress.     Breath sounds: Normal breath sounds.  Abdominal:     Palpations: Abdomen is soft.     Tenderness: There is no abdominal tenderness. There is no guarding or rebound.  Musculoskeletal:        General: No swelling or tenderness.  Skin:    General: Skin is warm and dry.  Neurological:     Mental Status: She is alert and oriented to person, place, and time.  Psychiatric:        Behavior: Behavior normal.      ED Treatments / Results  Labs (all labs ordered are listed, but only abnormal results are displayed) Labs Reviewed  BASIC METABOLIC PANEL - Abnormal; Notable for the following components:      Result Value   GFR calc non Af Amer 60 (*)    All other components within normal limits  CBC WITH DIFFERENTIAL/PLATELET  TROPONIN I  MAGNESIUM    EKG EKG Interpretation  Date/Time:  Thursday Oct 24 2018 12:03:14 EDT Ventricular Rate:  170 PR Interval:    QRS Duration: 119 QT Interval:  323 QTC Calculation: 544 R Axis:   87 Text Interpretation:  Atrial fibrillation Right bundle branch block Low voltage, precordial leads Confirmed by Quintella Reichert [phone]) on 10/24/2018 2:01:01 PM   Radiology Dg Chest Portable 1 View  Result Date: 10/24/2018 CLINICAL DATA:  Cardioversion. EXAM: PORTABLE CHEST 1 VIEW COMPARISON:  Chest x-ray dated March 20, 2013. FINDINGS: Borderline  cardiomegaly. Enlargement of the main pulmonary artery. Normal pulmonary vascularity. No focal consolidation, pleural effusion, or pneumothorax. Unchanged calcified granuloma at the right lung base. No acute osseous abnormality. IMPRESSION: No active disease. Electronically Signed   By: Titus Dubin M.D.   On: 10/24/2018 13:13    Procedures .Sedation Date/Time: 10/24/2018 12:50 PM Performed by: Quintella Reichert, MD Authorized by: Quintella Reichert, MD   Consent:    Consent obtained:  Verbal   Consent given by:  Patient   Risks discussed:  Allergic reaction, dysrhythmia, inadequate sedation, nausea, prolonged hypoxia resulting in organ damage, prolonged sedation necessitating reversal, respiratory compromise necessitating ventilatory assistance and intubation and vomiting   Alternatives discussed:  Analgesia without sedation, anxiolysis and regional anesthesia Universal protocol:    Procedure explained and questions answered to patient or proxy's satisfaction: yes     Relevant documents present and verified: yes     Test results available and properly labeled: yes     Imaging studies available: yes     Required blood products, implants, devices, and special equipment available: yes     Site/side marked: yes     Immediately prior to procedure a time out was called: yes     Patient identity confirmation method:  Verbally with patient Indications:    Procedure necessitating sedation performed by:  Physician performing sedation Pre-sedation assessment:    Time since last food or drink:  4   ASA classification: class 3 - patient with severe systemic disease     Neck mobility: normal     Mouth opening:  3 or more finger widths   Mallampati score:  III - soft palate, base of uvula visible   Pre-sedation assessments completed and reviewed: airway patency, cardiovascular function, hydration status, mental status, nausea/vomiting, pain level, respiratory function and temperature   Immediate  pre-procedure details:    Reassessment: Patient reassessed immediately prior to procedure     Reviewed: vital signs, relevant labs/tests and NPO status  Verified: bag valve mask available, emergency equipment available, intubation equipment available, IV patency confirmed, oxygen available and suction available   Procedure details (see MAR for exact dosages):    Preoxygenation:  Nasal cannula   Sedation:  Propofol   Intra-procedure monitoring:  Blood pressure monitoring, cardiac monitor, continuous pulse oximetry, frequent LOC assessments, frequent vital sign checks and continuous capnometry   Intra-procedure events: none     Total Provider sedation time (minutes):  10 Post-procedure details:    Post-sedation assessment completed:  10/24/2018 1:03 PM   Attendance: Constant attendance by certified staff until patient recovered     Recovery: Patient returned to pre-procedure baseline     Post-sedation assessments completed and reviewed: airway patency, cardiovascular function, hydration status, mental status, nausea/vomiting, pain level, respiratory function and temperature     Patient is stable for discharge or admission: yes     Patient tolerance:  Tolerated well, no immediate complications   (including critical care time)  Medications Ordered in ED Medications  adenosine (ADENOCARD) 6 MG/2ML injection (  Not Given 10/24/18 1304)  propofol (DIPRIVAN) 10 mg/mL bolus/IV push 43.6 mg (43.6 mg Intravenous Not Given 10/24/18 1305)  propofol (DIPRIVAN) 10 mg/mL bolus/IV push (40 mg Intravenous Given 10/24/18 1253)     Initial Impression / Assessment and Plan / ED Course  I have reviewed the triage vital signs and the nursing notes.  Pertinent labs & imaging results that were available during my care of the patient were reviewed by me and considered in my medical decision making (see chart for details).       patient with history of atrial fibrillation referred from the a clinic for  evaluation of palpitations and shortness of breath. She was noted to be in atrial fibrillation/flutter with RVR with a heart rate 140s to 170s on ED presentation. She was electrically cardioverter did after discussion with on-call cardiologist. Cardioversion performed by Dr. Jeanell Sparrow at [facility] x 1, sedation performed by myself per procedure note. Following cardioversion patient was noted to be in sinus rhythm with sinus arrhythmia, feeling improved. Plan to discharge home with cardiology follow-up as an outpatient as well as return precautions.  Pt is to continue current medications including Xarelto.  Current presentation is not consistent with COVID infection.  CHA2DS2/VAS Stroke Risk Points  Current as of 29 minutes ago     2 >= 2 Points: High Risk  1 - 1.99 Points: Medium Risk  0 Points: Low Risk    The patient's score has not changed in the past year.:  No Change     Details    This score determines the patient's risk of having a stroke if the  patient has atrial fibrillation.       Points Metrics  0 Has Congestive Heart Failure:  No    Current as of 29 minutes ago  0 Has Vascular Disease:  No    Current as of 29 minutes ago  0 Has Hypertension:  No    Current as of 29 minutes ago  1 Age:  52    Current as of 29 minutes ago  0 Has Diabetes:  No    Current as of 29 minutes ago  0 Had Stroke:  No  Had TIA:  No  Had thromboembolism:  No    Current as of 29 minutes ago  1 Female:  Yes    Current as of 29 minutes ago           Final Clinical Impressions(s) /  ED Diagnoses   Final diagnoses:  Atrial fibrillation with RVR [facility])    ED Discharge Orders    None       Quintella Reichert, MD 10/24/18 1439

## 2018-10-30 ENCOUNTER — Telehealth (HOSPITAL_COMMUNITY): Payer: Self-pay | Admitting: *Deleted

## 2018-10-30 NOTE — Telephone Encounter (Signed)
Patient called in stating she went back into AF last night. Her HRs are running between 70-170s. She can track her HRs on her cellphone. Per Roderic Palau NP - try increasing metoprolol to 37.5mg  BID until back in rhythm and follow with Dr. [name] 5/22 to discuss therapy change versus repeat ablation. Pt in agreement with this plan. ER precautions reviewed with patient.

## 2018-11-01 ENCOUNTER — Telehealth (INDEPENDENT_AMBULATORY_CARE_PROVIDER_SITE_OTHER): Payer: Medicare Other | Admitting: Internal Medicine

## 2018-11-01 ENCOUNTER — Encounter: Payer: Self-pay | Admitting: Internal Medicine

## 2018-11-01 VITALS — HR 107 | Ht 61.0 in | Wt 192.0 lb

## 2018-11-01 DIAGNOSIS — I4819 Other persistent atrial fibrillation: Secondary | ICD-10-CM

## 2018-11-01 DIAGNOSIS — I4892 Unspecified atrial flutter: Secondary | ICD-10-CM

## 2018-11-01 MED ORDER — AMIODARONE HCL 200 MG PO TABS: 200.0000 mg | ORAL_TABLET | Freq: Two times a day (BID) | ORAL | 3 refills | Status: DC

## 2018-11-01 NOTE — Progress Notes (Signed)
Electrophysiology TeleHealth Note   Due to national recommendations of social distancing due to [place] 19, an audio  telehealth visit is felt to be most appropriate for this patient at this time.  Verbal consent was obtained from the patient today.  She was not able to connect to our virtual portal.  A phone visit is therefore required for the visit today.   Date:  11/01/2018   ID:  Angela Cummings, DOB [birth year], MRN [id number]  Location: patient's home  Provider location: [place]  Evaluation Performed: Follow-up visit  PCP:  Patient, No Pcp Per   Electrophysiologist:  Dr [name]  Chief Complaint:  afib  History of Present Illness:    Angela Cummings is a 63 y.o. female who presents via audio conferencing for a telehealth visit today. I have not seen her in several years.  She has been following primarily in AF clinic.  She has done well for several years with [place].  She recently developed atyipical atrial flutter and required cardioversion.  Her son died in 08/15/22 and she is grieving.  She thinks this is the cause for her worsening arrhythmias.  Her primary concern is with heart racing/ RVR. Today, she denies symptoms of palpitations, chest pain, shortness of breath,  lower extremity edema, dizziness, presyncope, or syncope.  The patient is otherwise without complaint today.  The patient denies symptoms of fevers, chills, cough, or new SOB worrisome for COVID 19.  Past Medical History:  Diagnosis Date  . Anxiety   . Asthma   . Atrial fibrillation ([place]) 11/2010   paroxysmal  . Bradycardia   . Depression   . DJD (degenerative joint disease)    Cervical spine  . Fibromyalgia    s/p rheumatology consultation/[facility]  . Mild sleep apnea   . Obesity   . Osteoarthritis    Shoulders, hips,knees    Past Surgical History:  Procedure Laterality Date  . ABLATION  08/2012   PVI by Dr [name]  . ABLATION  11/13/2013   PVI by Dr [name]  . ATRIAL FIBRILLATION ABLATION  N/A 09/03/2012   Procedure: ATRIAL FIBRILLATION ABLATION;  Surgeon: Thompson Grayer, MD;  Location: [facility] CATH LAB;  Service: Cardiovascular;  Laterality: N/A;  . ATRIAL FIBRILLATION ABLATION N/A 11/13/2013   Procedure: ATRIAL FIBRILLATION ABLATION;  Surgeon: Coralyn Mark, MD;  Location: [place] CATH LAB;  Service: Cardiovascular;  Laterality: N/A;  . CARDIOVERSION  03/13/2012   Procedure: CARDIOVERSION;  Surgeon: [name], MD;  Location: [facility] ENDOSCOPY;  Service: Cardiovascular;  Laterality: N/A;  . CARDIOVERSION N/A 07/26/2012   Procedure: CARDIOVERSION;  Surgeon: Thompson Grayer, MD;  Location: [place];  Service: Cardiovascular;  Laterality: N/A;  . CARDIOVERSION N/A 02/20/2014   Procedure: CARDIOVERSION;  Surgeon: Larey Dresser, MD;  Location: [place];  Service: Cardiovascular;  Laterality: N/A;  . CESAREAN SECTION    . CHOLECYSTECTOMY    . HERNIA REPAIR    . KNEE SURGERY    . neck tumor resection     benign  . TEE WITHOUT CARDIOVERSION  03/13/2012   Procedure: TRANSESOPHAGEAL ECHOCARDIOGRAM (TEE);  Surgeon: [name], MD;  Location: [place];  Service: Cardiovascular;  Laterality: N/A;  Rm 2029  . TEE WITHOUT CARDIOVERSION N/A 09/02/2012   Procedure: TRANSESOPHAGEAL ECHOCARDIOGRAM (TEE);  Surgeon: Josue Hector, MD;  Location: [place];  Service: Cardiovascular;  Laterality: N/A;  . TEE WITHOUT CARDIOVERSION N/A 11/13/2013   Procedure: TRANSESOPHAGEAL ECHOCARDIOGRAM (TEE);  Surgeon: [name], MD;  Location: [facility] ENDOSCOPY;  Service: Cardiovascular;  Laterality: N/A;    Current Outpatient Medications  Medication Sig Dispense Refill  . acetaminophen (TYLENOL) 500 MG tablet Take by mouth.    . Diclofenac-Misoprostol 75-0.2 MG TBEC TAKE 1 TABLET BY MOUTH TWICE DAILY 60 tablet 0  . diltiazem (TIAZAC) 240 MG 24 hr capsule Take 1 capsule (240 mg total) by mouth daily. 10 capsule 0  . dofetilide (TIKOSYN) 250 MCG capsule TAKE 1 CAPSULE(250 MCG) BY MOUTH TWICE DAILY 180 capsule 2  .  KLOR-CON M20 20 MEQ tablet TAKE 2 TABLETS (40 MEQ TOTAL) 2 (TWO) TIMES DAILY BY MOUTH. 360 tablet 2  . metoprolol succinate (TOPROL-XL) 25 MG 24 hr tablet Take 1 tablet (25 mg total) by mouth 2 (two) times daily. 60 tablet 3  . pantoprazole (PROTONIX) 40 MG tablet TAKE 1 TABLET BY MOUTH TWICE A DAY 180 tablet 1  . PROVENTIL HFA 108 (90 Base) MCG/ACT inhaler INHALE 2 PUFFS INTO THE LUNGS EVERY 6 HOURS AS NEEDED 6.7 g 0  . XARELTO 20 MG TABS tablet TAKE 1 TABLET EVERY DAY WITH SUPPER 90 tablet 2   No current facility-administered medications for this visit.     Allergies:   Adhesive [tape]   Social History:  The patient  reports that she has never smoked. She has never used smokeless tobacco. She reports that she does not drink alcohol or use drugs.   Family History:  The patient's  family history includes Anxiety disorder in her mother; COPD in her mother and sister; Cancer in her daughter, father, and sister; Diabetes in an other family member; Emphysema in her brother and mother; Heart disease in her sister; Heart disease (age of onset: 80) in her father; Thyroid disease in an other family member.   ROS:  Please see the history of present illness.   All other systems are personally reviewed and negative.    Exam:    Vital Signs:  Pulse (!) 107   Ht 5\' 1"  (1.549 m)   Wt 192 lb (87.1 kg)   BMI 36.28 kg/m   tearful   Labs/Other Tests and Data Reviewed:    Recent Labs: 10/24/2018: BUN 9; Creatinine, Ser 0.99; Hemoglobin 12.9; Magnesium 2.2; Platelets 272; Potassium 4.1; Sodium 139   Wt Readings from Last 3 Encounters:  11/01/18 192 lb (87.1 kg)  10/24/18 192 lb (87.1 kg)  10/24/18 192 lb 9.6 oz (87.4 kg)     Other studies personally reviewed: Additional studies/ records that were reviewed today include: my prior ablation note, prior office notes,  Recent ER notes  Review of the above records today demonstrates: as above    ASSESSMENT & PLAN:    1.  Persistent atrial  fibrillation/ atypical atrial flutter The patient has symptomatic, recurrent  atrial arrhythmias.  She is s/p ablation twice (most recently 2015).  she has failed medical therapy with tikosyn.    she is anticoagulated with xarelto . Therapeutic strategies for afib/ atypical atrial flutter including medicine (amiodarone) and repeat ablation were discussed in detail with the patient today. Risk, benefits, and alternatives to each approach were discussed today.  She would like to avoid ablation currently.  I will therefore stop tiksoyn and start amiodarone. Stop tikosyn today Start amiodarone 200mg  BID in 72 hours.  Follow-up in AF clinic in 3 weeks May require repeat cardioversion if out of rhythm at that time.  2. COVID 19 screen The patient denies symptoms of COVID 19 at this time.  The importance  of social distancing was discussed today.   Current medicines are reviewed at length with the patient today.   The patient does not have concerns regarding her medicines.  The following changes were made today:  none  Labs/ tests ordered today include:  No orders of the defined types were placed in this encounter.   Patient Risk:  after full review of this patients clinical status, I feel that they are at moderate risk at this time.  Today, I have spent 22 minutes with the patient with telehealth technology discussing afib .   Very complicated patient with refractory atrial arrhythmias.  She is at risk for decompensation/ additional hospitalization.  A high level of decision making was required for this encounter.  Army Fossa, MD  11/01/2018 3:23 PM     [place] [id number] [phone] (office) [phone] (fax)

## 2018-11-05 ENCOUNTER — Other Ambulatory Visit: Payer: Self-pay

## 2018-11-07 ENCOUNTER — Ambulatory Visit (HOSPITAL_COMMUNITY): Payer: Medicare Other | Admitting: Nurse Practitioner

## 2018-11-24 ENCOUNTER — Other Ambulatory Visit (HOSPITAL_COMMUNITY): Payer: Self-pay | Admitting: Nurse Practitioner

## 2018-11-25 ENCOUNTER — Ambulatory Visit (HOSPITAL_COMMUNITY): Payer: Medicare Other | Admitting: Physician Assistant

## 2018-11-25 NOTE — Telephone Encounter (Signed)
This is a A-Fib clinic pt 

## 2018-11-29 ENCOUNTER — Other Ambulatory Visit (HOSPITAL_COMMUNITY): Payer: Self-pay | Admitting: Nurse Practitioner

## 2018-11-30 ENCOUNTER — Other Ambulatory Visit (HOSPITAL_COMMUNITY): Payer: Self-pay | Admitting: Nurse Practitioner

## 2018-12-25 ENCOUNTER — Other Ambulatory Visit: Payer: Self-pay

## 2018-12-25 ENCOUNTER — Encounter: Payer: Self-pay | Admitting: Emergency Medicine

## 2018-12-25 ENCOUNTER — Ambulatory Visit (INDEPENDENT_AMBULATORY_CARE_PROVIDER_SITE_OTHER): Payer: Medicare Other | Admitting: Emergency Medicine

## 2018-12-25 VITALS — BP 115/81 | HR 100 | Temp 98.3°F | Resp 16 | Ht 61.0 in | Wt 194.0 lb

## 2018-12-25 DIAGNOSIS — M150 Primary generalized (osteo)arthritis: Secondary | ICD-10-CM

## 2018-12-25 DIAGNOSIS — M159 Polyosteoarthritis, unspecified: Secondary | ICD-10-CM

## 2018-12-25 DIAGNOSIS — I480 Paroxysmal atrial fibrillation: Secondary | ICD-10-CM

## 2018-12-25 DIAGNOSIS — F329 Major depressive disorder, single episode, unspecified: Secondary | ICD-10-CM | POA: Diagnosis not present

## 2018-12-25 DIAGNOSIS — F32A Depression, unspecified: Secondary | ICD-10-CM

## 2018-12-25 DIAGNOSIS — F418 Other specified anxiety disorders: Secondary | ICD-10-CM

## 2018-12-25 DIAGNOSIS — M8949 Other hypertrophic osteoarthropathy, multiple sites: Secondary | ICD-10-CM

## 2018-12-25 MED ORDER — DICLOFENAC-MISOPROSTOL 75-0.2 MG PO TBEC: 1.0000 | DELAYED_RELEASE_TABLET | Freq: Two times a day (BID) | ORAL | 1 refills | Status: DC

## 2018-12-25 MED ORDER — BUSPIRONE HCL 7.5 MG PO TABS: 7.5000 mg | ORAL_TABLET | Freq: Two times a day (BID) | ORAL | 2 refills | Status: DC

## 2018-12-25 NOTE — Patient Instructions (Addendum)
If you have lab work done today you will be contacted with your lab results within the next 2 weeks.  If you have not heard from [place] then please contact us. The fastest way to get your results is to register for My Chart.   IF you received an x-ray today, you will receive an invoice from [facility]. Please contact [facility] at [phone] with questions or concerns regarding your invoice.   IF you received labwork today, you will receive an invoice from [facility]. Please contact [facility] at [phone] with questions or concerns regarding your invoice.   Our billing staff will not be able to assist you with questions regarding bills from these companies.  You will be contacted with the lab results as soon as they are available. The fastest way to get your results is to activate your My Chart account. Instructions are located on the last page of this paperwork. If you have not heard from [place] regarding the results in 2 weeks, please contact this office.      Major Depressive Disorder, Adult Major depressive disorder (MDD) is a mental health condition. MDD often makes you feel sad, hopeless, or helpless. MDD can also cause symptoms in your body. MDD can affect your:  Work.  School.  Relationships.  Other normal activities. MDD can range from mild to very bad. It may occur once (single episode MDD). It can also occur many times (recurrent MDD). The main symptoms of MDD often include:  Feeling sad, depressed, or irritable most of the time.  Loss of interest. MDD symptoms also include:  Sleeping too much or too little.  Eating too much or too little.  A change in your weight.  Feeling tired (fatigue) or having low energy.  Feeling worthless.  Feeling guilty.  Trouble making decisions.  Trouble thinking clearly.  Thoughts of suicide or harming others.  Feeling weak.  Feeling agitated.  Keeping yourself from being around other people  (isolation). Follow these instructions at home: Activity  Do these things as told by your doctor: ? Go back to your normal activities. ? Exercise regularly. ? Spend time outdoors. Alcohol  Talk with your doctor about how alcohol can affect your antidepressant medicines.  Do not drink alcohol. Or, limit how much alcohol you drink. ? This means no more than 1 drink a day for nonpregnant women and 2 drinks a day for men. One drink equals one of these:  12 oz of beer.  5 oz of wine.  1 oz of hard liquor. General instructions  Take over-the-counter and prescription medicines only as told by your doctor.  Eat a healthy diet.  Get plenty of sleep.  Find activities that you enjoy. Make time to do them.  Think about joining a support group. Your doctor may be able to suggest a group for you.  Keep all follow-up visits as told by your doctor. This is important. Where to find more information:  [employer] on Mental Illness: ? www.nami.[place]: ? https://carter.com/  National Suicide Prevention Lifeline: ? (631)575-8770. This is free, 24-hour help. Contact a doctor if:  Your symptoms get worse.  You have new symptoms. Get help right away if:  You self-harm.  You see, hear, taste, smell, or feel things that are not present (hallucinate). If you ever feel like you may hurt yourself or others, or have thoughts about taking your own life, get help right away. You can go to  your nearest emergency department or call:  Your local emergency services (911 in the [place]  A suicide crisis helpline, such as the National Suicide Prevention Lifeline: ? 754-106-8701. This is open 24 hours a day. This information is not intended to replace advice given to you by your health care provider. Make sure you discuss any questions you have with your health care provider. Document Released: 05/10/2015 Document Revised: 05/11/2017 Document  Reviewed: 02/13/2016 Elsevier Patient Education  2020 [employer].

## 2018-12-25 NOTE — Assessment & Plan Note (Signed)
Mixed in with anxiety.  High depression score of 21.  Recommend to start medication along with psychiatric evaluation.  Will avoid SSRIs and SNRI due to side effects and drug interactions with antiarrhythmics and propensity for QT segment prolongation.  Will start BuSpar 7.5 mg twice a day.  Follow-up in 3 months after psychiatric evaluation.

## 2018-12-25 NOTE — Progress Notes (Signed)
Angela Cummings 63 y.o.   Chief Complaint  Patient presents with  . Arthritis    pt states she needs a refill on arthritis medication  . Medication Refill    diclofenac  . Depression    see screen    HISTORY OF PRESENT ILLNESS: This is a 63 y.o. female for follow-up on chronic medical problems and acute depression.  Telemedicine visit with me on 09/06/2018.  Diagnosis of situational anxiety.  Started on lorazepam without significant relief.  Patient has the following chronic and acute medical problems: 1.  History of paroxysmal atrial fibrillation: Sees cardiologist regularly.  Has had ablation x3, has been cardioverted, presently on amiodarone and metoprolol.  Also on Xarelto. 2.  History of asthma: Well-controlled.  Uses albuterol 4-5 times per year. 3.  Osteoarthritis: Mostly on the knees.  Takes diclofenac regularly.  Cardiologist aware of this.  Requesting refill.  States that she cannot function without it. 4.  Depression: Presently active, since loss of his 36 year old son last March.  Lorazepam not effective.  Was referred to psychiatry. Depression screen [facility] 2/9 12/25/2018 09/06/2018 10/05/2017 12/30/2015 03/14/2015  Decreased Interest 3 3 0 0 3  Down, Depressed, Hopeless 3 3 0 0 3  PHQ - 2 Score 6 6 0 0 6  Altered sleeping 2 3 - - 3  Tired, decreased energy 2 3 - - 2  Change in appetite 3 3 - - 2  Feeling bad or failure about yourself  3 3 - - 2  Trouble concentrating 2 3 - - 0  Moving slowly or fidgety/restless 3 0 - - 0  Suicidal thoughts 0 0 - - 0  PHQ-9 Score 21 21 - - 15  Difficult doing work/chores Very difficult Extremely dIfficult - - -    HPI   Prior to Admission medications   Medication Sig Start Date End Date Taking? Authorizing Provider  acetaminophen (TYLENOL) 500 MG tablet Take by mouth.   Yes [provider]  amiodarone (PACERONE) 200 MG tablet Take 1 tablet (200 mg total) by mouth 2 (two) times daily. 11/01/18  Yes Allred, Jeneen Rinks, MD   Diclofenac-Misoprostol 75-0.2 MG TBEC TAKE 1 TABLET BY MOUTH TWICE DAILY 11/25/15  Yes Wardell Honour, MD  diltiazem [facility]) 240 MG 24 hr capsule Take 1 capsule (240 mg total) by mouth daily. 08/01/18  Yes Sherran Needs, NP  KLOR-CON M20 20 MEQ tablet TAKE 2 TABLETS (40 MEQ TOTAL) 2 (TWO) TIMES DAILY BY MOUTH. 12/04/18  Yes Sherran Needs, NP  metoprolol succinate (TOPROL-XL) 25 MG 24 hr tablet Take 1 tablet (25 mg total) by mouth 2 (two) times daily. 01/04/18  Yes Sherran Needs, NP  pantoprazole (PROTONIX) 40 MG tablet Take 1 tablet (40 mg total) by mouth 2 (two) times daily. appt required for refills [phone] 11/29/18  Yes Sherran Needs, NP  PROVENTIL HFA 108 [phone] [place]) MCG/ACT inhaler INHALE 2 PUFFS INTO THE LUNGS EVERY 6 HOURS AS NEEDED 02/16/16  Yes Wardell Honour, MD  XARELTO 20 MG TABS tablet TAKE 1 TABLET EVERY DAY WITH SUPPER 11/25/18  Yes Sherran Needs, NP    Allergies  Allergen Reactions  . Adhesive [Tape] Itching and Rash    Paper tape is better but best to use no adhesive if possible    Patient Active Problem List   Diagnosis Date Noted  . Osteoarthritis of left foot 10/06/2017  . GERD (gastroesophageal reflux disease) 08/23/2017  . Fibromyalgia 03/14/2015  . Degenerative disc  disease, cervical 03/14/2015  . Primary osteoarthritis involving multiple joints 03/14/2015  . Depression 02/18/2014  . Asthma   . PAF (paroxysmal atrial fibrillation) ([place]) 11/28/2010  . Obesity 11/28/2010    Past Medical History:  Diagnosis Date  . Anxiety   . Asthma   . Atrial fibrillation ([place]) 11/2010   paroxysmal  . Bradycardia   . Depression   . DJD (degenerative joint disease)    Cervical spine  . Fibromyalgia    s/p rheumatology consultation/[facility]  . Mild sleep apnea   . Obesity   . Osteoarthritis    Shoulders, hips,knees    Past Surgical History:  Procedure Laterality Date  . ABLATION  08/2012   PVI by Dr [name]  . ABLATION  11/13/2013   PVI by Dr [name]  .  ATRIAL FIBRILLATION ABLATION N/A 09/03/2012   Procedure: ATRIAL FIBRILLATION ABLATION;  Surgeon: Thompson Grayer, MD;  Location: [facility] CATH LAB;  Service: Cardiovascular;  Laterality: N/A;  . ATRIAL FIBRILLATION ABLATION N/A 11/13/2013   Procedure: ATRIAL FIBRILLATION ABLATION;  Surgeon: Coralyn Mark, MD;  Location: [place] CATH LAB;  Service: Cardiovascular;  Laterality: N/A;  . CARDIOVERSION  03/13/2012   Procedure: CARDIOVERSION;  Surgeon: [name], MD;  Location: [facility] ENDOSCOPY;  Service: Cardiovascular;  Laterality: N/A;  . CARDIOVERSION N/A 07/26/2012   Procedure: CARDIOVERSION;  Surgeon: Thompson Grayer, MD;  Location: [place];  Service: Cardiovascular;  Laterality: N/A;  . CARDIOVERSION N/A 02/20/2014   Procedure: CARDIOVERSION;  Surgeon: Larey Dresser, MD;  Location: [place];  Service: Cardiovascular;  Laterality: N/A;  . CESAREAN SECTION    . CHOLECYSTECTOMY    . HERNIA REPAIR    . KNEE SURGERY    . neck tumor resection     benign  . TEE WITHOUT CARDIOVERSION  03/13/2012   Procedure: TRANSESOPHAGEAL ECHOCARDIOGRAM (TEE);  Surgeon: [name], MD;  Location: [place];  Service: Cardiovascular;  Laterality: N/A;  Rm 2029  . TEE WITHOUT CARDIOVERSION N/A 09/02/2012   Procedure: TRANSESOPHAGEAL ECHOCARDIOGRAM (TEE);  Surgeon: Josue Hector, MD;  Location: [place];  Service: Cardiovascular;  Laterality: N/A;  . TEE WITHOUT CARDIOVERSION N/A 11/13/2013   Procedure: TRANSESOPHAGEAL ECHOCARDIOGRAM (TEE);  Surgeon: [name], MD;  Location: [facility] ENDOSCOPY;  Service: Cardiovascular;  Laterality: N/A;    Social History   Socioeconomic History  . Marital status: Widowed    Spouse name: Not on file  . Number of children: 3  . Years of education: Not on file  . Highest education level: Not on file  Occupational History  . Not on file  Social Needs  . Financial resource strain: Not on file  . Food insecurity    Worry: Not on file    Inability: Not on file  . Transportation  needs    Medical: Not on file    Non-medical: Not on file  Tobacco Use  . Smoking status: Never Smoker  . Smokeless tobacco: Never Used  Substance and Sexual Activity  . Alcohol use: No    Comment: Rarely  . Drug use: No  . Sexual activity: Never  Lifestyle  . Physical activity    Days per week: Not on file    Minutes per session: Not on file  . Stress: Not on file  Relationships  . Social Herbalist on phone: Not on file    Gets together: Not on file    Attends religious service: Not on file    Active member of  club or organization: Not on file    Attends meetings of clubs or organizations: Not on file    Relationship status: Not on file  . Intimate partner violence    Fear of current or ex partner: Not on file    Emotionally abused: Not on file    Physically abused: Not on file    Forced sexual activity: Not on file  Other Topics Concern  . Not on file  Social History Narrative   Marital status: widowed since 01/2014 of melanoma; not dating.       Children:  3 children (17,30, 32) and 4 grandchildren and 1 gg. 1 adopted child.       Lives with son.  Lives in [place].        Employment:  Unemployed.       Tobacco: none      Alcohol: none      Drugs: none      Exercise: none    Family History  Problem Relation Age of Onset  . Emphysema Mother   . COPD Mother   . Anxiety disorder Mother   . Cancer Father        lung cancer; non-smoker  . Heart disease Father 63       AMI  . COPD Sister   . Emphysema Brother   . Cancer Daughter   . Cancer Sister        lung cancer  . Heart disease Sister        AMI  . Diabetes Other   . Thyroid disease Other      Review of Systems  Constitutional: Negative.  Negative for chills and fever.  HENT: Negative.   Eyes: Negative.   Respiratory: Negative.  Negative for cough and shortness of breath.   Cardiovascular: Negative.  Negative for chest pain and palpitations.  Gastrointestinal: Negative.  Negative for  abdominal pain, diarrhea, nausea and vomiting.  Genitourinary: Negative.   Musculoskeletal: Positive for joint pain.  Skin: Negative.  Negative for rash.  Neurological: Negative for dizziness and headaches.  Psychiatric/Behavioral: Positive for depression.  All other systems reviewed and are negative.  Vitals:   12/25/18 0829  BP: 115/81  Pulse: 100  Resp: 16  Temp: 98.3 F (36.8 C)  SpO2: 98%     Physical Exam Vitals signs reviewed.  Constitutional:      Appearance: Normal appearance.  HENT:     Head: Normocephalic and atraumatic.     Mouth/Throat:     Mouth: Mucous membranes are moist.     Pharynx: Oropharynx is clear.  Eyes:     Extraocular Movements: Extraocular movements intact.     Conjunctiva/sclera: Conjunctivae normal.     Pupils: Pupils are equal, round, and reactive to light.  Cardiovascular:     Rate and Rhythm: Tachycardia present. Rhythm irregular.     Heart sounds: Normal heart sounds.  Pulmonary:     Effort: Pulmonary effort is normal.     Breath sounds: Normal breath sounds.  Abdominal:     General: Abdomen is flat.     Palpations: Abdomen is soft.     Tenderness: There is no abdominal tenderness.  Musculoskeletal: Normal range of motion.  Skin:    General: Skin is warm and dry.  Neurological:     General: No focal deficit present.     Mental Status: She is alert and oriented to person, place, and time.  Psychiatric:        Mood and Affect: Mood  normal.        Behavior: Behavior normal.      ASSESSMENT & PLAN: Depression Mixed in with anxiety.  High depression score of 21.  Recommend to start medication along with psychiatric evaluation.  Will avoid SSRIs and SNRI due to side effects and drug interactions with antiarrhythmics and propensity for QT segment prolongation.  Will start BuSpar 7.5 mg twice a day.  Follow-up in 3 months after psychiatric evaluation.  Angela Cummings was seen today for arthritis, medication refill and depression.  Diagnoses  and all orders for this visit:  Acute depression -     busPIRone (BUSPAR) 7.5 MG tablet; Take 1 tablet (7.5 mg total) by mouth 2 (two) times daily. -     Ambulatory referral to Psychiatry  Situational anxiety -     busPIRone (BUSPAR) 7.5 MG tablet; Take 1 tablet (7.5 mg total) by mouth 2 (two) times daily. -     Ambulatory referral to Psychiatry  PAF (paroxysmal atrial fibrillation) ([facility])  Primary osteoarthritis involving multiple joints -     Diclofenac-miSOPROStol 75-0.2 MG TBEC; Take 1 tablet by mouth 2 (two) times daily.  Reactive depression    Patient Instructions       If you have lab work done today you will be contacted with your lab results within the next 2 weeks.  If you have not heard from [place] then please contact us. The fastest way to get your results is to register for My Chart.   IF you received an x-ray today, you will receive an invoice from [facility]. Please contact [facility] at [phone] with questions or concerns regarding your invoice.   IF you received labwork today, you will receive an invoice from [facility]. Please contact [facility] at [phone] with questions or concerns regarding your invoice.   Our billing staff will not be able to assist you with questions regarding bills from these companies.  You will be contacted with the lab results as soon as they are available. The fastest way to get your results is to activate your My Chart account. Instructions are located on the last page of this paperwork. If you have not heard from [place] regarding the results in 2 weeks, please contact this office.      Major Depressive Disorder, Adult Major depressive disorder (MDD) is a mental health condition. MDD often makes you feel sad, hopeless, or helpless. MDD can also cause symptoms in your body. MDD can affect your:  Work.  School.  Relationships.  Other normal activities. MDD can range from mild to very bad. It may occur once  (single episode MDD). It can also occur many times (recurrent MDD). The main symptoms of MDD often include:  Feeling sad, depressed, or irritable most of the time.  Loss of interest. MDD symptoms also include:  Sleeping too much or too little.  Eating too much or too little.  A change in your weight.  Feeling tired (fatigue) or having low energy.  Feeling worthless.  Feeling guilty.  Trouble making decisions.  Trouble thinking clearly.  Thoughts of suicide or harming others.  Feeling weak.  Feeling agitated.  Keeping yourself from being around other people (isolation). Follow these instructions at home: Activity  Do these things as told by your doctor: ? Go back to your normal activities. ? Exercise regularly. ? Spend time outdoors. Alcohol  Talk with your doctor about how alcohol can affect your antidepressant medicines.  Do not drink alcohol. Or, limit how much alcohol you  drink. ? This means no more than 1 drink a day for nonpregnant women and 2 drinks a day for men. One drink equals one of these:  12 oz of beer.  5 oz of wine.  1 oz of hard liquor. General instructions  Take over-the-counter and prescription medicines only as told by your doctor.  Eat a healthy diet.  Get plenty of sleep.  Find activities that you enjoy. Make time to do them.  Think about joining a support group. Your doctor may be able to suggest a group for you.  Keep all follow-up visits as told by your doctor. This is important. Where to find more information:  [employer] on Mental Illness: ? www.nami.[place]: ? https://carter.com/  National Suicide Prevention Lifeline: ? 514-275-5031. This is free, 24-hour help. Contact a doctor if:  Your symptoms get worse.  You have new symptoms. Get help right away if:  You self-harm.  You see, hear, taste, smell, or feel things that are not present (hallucinate). If you ever  feel like you may hurt yourself or others, or have thoughts about taking your own life, get help right away. You can go to your nearest emergency department or call:  Your local emergency services (911 in the [place]  A suicide crisis helpline, such as the National Suicide Prevention Lifeline: ? 3023950946. This is open 24 hours a day. This information is not intended to replace advice given to you by your health care provider. Make sure you discuss any questions you have with your health care provider. Document Released: 05/10/2015 Document Revised: 05/11/2017 Document Reviewed: 02/13/2016 Elsevier Patient Education  2020 Elsevier Inc.      Agustina Caroli, MD [facility]

## 2018-12-28 ENCOUNTER — Other Ambulatory Visit (HOSPITAL_COMMUNITY): Payer: Self-pay | Admitting: Nurse Practitioner

## 2019-01-16 ENCOUNTER — Other Ambulatory Visit: Payer: Self-pay | Admitting: Emergency Medicine

## 2019-01-16 DIAGNOSIS — F32A Depression, unspecified: Secondary | ICD-10-CM

## 2019-01-16 DIAGNOSIS — F418 Other specified anxiety disorders: Secondary | ICD-10-CM

## 2019-01-16 DIAGNOSIS — F329 Major depressive disorder, single episode, unspecified: Secondary | ICD-10-CM

## 2019-01-16 NOTE — Telephone Encounter (Signed)
Ok to switch to #[phone]

## 2019-01-21 NOTE — Telephone Encounter (Signed)
Attempted to call pt and there was no answer. The Voicemail box has not been set up yet.   I am able to do 1 med refill until they are able to get in with psychiatry. A referral has been sent to the [facility]. No more refills after this one unless they make an appointment with the specialist.

## 2019-01-27 ENCOUNTER — Other Ambulatory Visit (HOSPITAL_COMMUNITY): Payer: Self-pay | Admitting: Nurse Practitioner

## 2019-02-04 ENCOUNTER — Other Ambulatory Visit (HOSPITAL_COMMUNITY): Payer: Self-pay | Admitting: Emergency Medicine

## 2019-02-21 ENCOUNTER — Other Ambulatory Visit (HOSPITAL_COMMUNITY): Payer: Self-pay | Admitting: Nurse Practitioner

## 2019-02-25 ENCOUNTER — Other Ambulatory Visit: Payer: Self-pay | Admitting: Emergency Medicine

## 2019-02-25 DIAGNOSIS — M8949 Other hypertrophic osteoarthropathy, multiple sites: Secondary | ICD-10-CM

## 2019-02-25 DIAGNOSIS — M159 Polyosteoarthritis, unspecified: Secondary | ICD-10-CM

## 2019-03-05 ENCOUNTER — Other Ambulatory Visit (HOSPITAL_COMMUNITY): Payer: Self-pay | Admitting: Emergency Medicine

## 2019-03-11 ENCOUNTER — Other Ambulatory Visit: Payer: Self-pay

## 2019-03-11 ENCOUNTER — Encounter: Payer: Self-pay | Admitting: Emergency Medicine

## 2019-03-11 ENCOUNTER — Ambulatory Visit (INDEPENDENT_AMBULATORY_CARE_PROVIDER_SITE_OTHER): Payer: Medicare Other | Admitting: Emergency Medicine

## 2019-03-11 VITALS — BP 126/80 | HR 106 | Temp 98.2°F | Resp 16 | Ht 61.0 in | Wt 206.2 lb

## 2019-03-11 DIAGNOSIS — Z23 Encounter for immunization: Secondary | ICD-10-CM | POA: Diagnosis not present

## 2019-03-11 DIAGNOSIS — R2242 Localized swelling, mass and lump, left lower limb: Secondary | ICD-10-CM

## 2019-03-11 DIAGNOSIS — M150 Primary generalized (osteo)arthritis: Secondary | ICD-10-CM

## 2019-03-11 DIAGNOSIS — M159 Polyosteoarthritis, unspecified: Secondary | ICD-10-CM

## 2019-03-11 DIAGNOSIS — M19072 Primary osteoarthritis, left ankle and foot: Secondary | ICD-10-CM | POA: Diagnosis not present

## 2019-03-11 DIAGNOSIS — I480 Paroxysmal atrial fibrillation: Secondary | ICD-10-CM

## 2019-03-11 DIAGNOSIS — M8949 Other hypertrophic osteoarthropathy, multiple sites: Secondary | ICD-10-CM

## 2019-03-11 NOTE — Progress Notes (Signed)
Angela Cummings 63 y.o.   Chief Complaint  Patient presents with  . Foot Swelling    LEFT with burning x 3 month per patient has gotten worse    HISTORY OF PRESENT ILLNESS: This is a 63 y.o. female complaining of pain and swelling to her left foot intermittently for the past 3 months.  Better today.  Denies injuries.  Has a history of osteoarthritis including her left foot.  No other associated symptoms.  HPI   Prior to Admission medications   Medication Sig Start Date End Date Taking? Authorizing Provider  acetaminophen (TYLENOL) 500 MG tablet Take by mouth.   Yes [provider]  amiodarone (PACERONE) 200 MG tablet Take 1 tablet (200 mg total) by mouth 2 (two) times daily. 11/01/18  Yes Allred, Jeneen Rinks, MD  busPIRone (BUSPAR) 7.5 MG tablet TAKE 1 TABLET (7.5 MG TOTAL) BY MOUTH 2 (TWO) TIMES DAILY. 01/21/19 04/21/19 Yes Darrold Bezek, Ines Bloomer, MD  Diclofenac-miSOPROStol 75-0.2 MG TBEC TAKE 1 TABLET BY MOUTH TWICE A DAY 02/25/19  Yes Jetty Berland, Ines Bloomer, MD  diltiazem [facility]) 240 MG 24 hr capsule Take 1 capsule (240 mg total) by mouth daily. appt required for refills [phone] 02/21/19  Yes Sherran Needs, NP  KLOR-CON M20 20 MEQ tablet TAKE 2 TABLETS (40 MEQ TOTAL) 2 (TWO) TIMES DAILY BY MOUTH. 12/04/18  Yes Sherran Needs, NP  metoprolol succinate (TOPROL-XL) 25 MG 24 hr tablet Take 1 tablet (25 mg total) by mouth 2 (two) times daily. 01/04/18  Yes Sherran Needs, NP  pantoprazole (PROTONIX) 40 MG tablet TAKE 1 TABLET BY MOUTH TWICE A DAY 03/05/19  Yes Horald Pollen, MD  PROVENTIL HFA 108 [phone] Base) MCG/ACT inhaler INHALE 2 PUFFS INTO THE LUNGS EVERY 6 HOURS AS NEEDED 02/16/16  Yes Wardell Honour, MD  XARELTO 20 MG TABS tablet TAKE 1 TABLET EVERY DAY WITH SUPPER 11/25/18  Yes Sherran Needs, NP    Allergies  Allergen Reactions  . Adhesive [Tape] Itching and Rash    Paper tape is better but best to use no adhesive if possible    Patient Active Problem List   Diagnosis Date Noted  . Osteoarthritis of left foot 10/06/2017  . GERD (gastroesophageal reflux disease) 08/23/2017  . Fibromyalgia 03/14/2015  . Degenerative disc disease, cervical 03/14/2015  . Primary osteoarthritis involving multiple joints 03/14/2015  . Depression 02/18/2014  . Asthma   . PAF (paroxysmal atrial fibrillation) ([place]) 11/28/2010  . Obesity 11/28/2010    Past Medical History:  Diagnosis Date  . Anxiety   . Asthma   . Atrial fibrillation ([place]) 11/2010   paroxysmal  . Bradycardia   . Depression   . DJD (degenerative joint disease)    Cervical spine  . Fibromyalgia    s/p rheumatology consultation/[facility]  . Mild sleep apnea   . Obesity   . Osteoarthritis    Shoulders, hips,knees    Past Surgical History:  Procedure Laterality Date  . ABLATION  08/2012   PVI by Dr [name]  . ABLATION  11/13/2013   PVI by Dr [name]  . ATRIAL FIBRILLATION ABLATION N/A 09/03/2012   Procedure: ATRIAL FIBRILLATION ABLATION;  Surgeon: Thompson Grayer, MD;  Location: [facility] CATH LAB;  Service: Cardiovascular;  Laterality: N/A;  . ATRIAL FIBRILLATION ABLATION N/A 11/13/2013   Procedure: ATRIAL FIBRILLATION ABLATION;  Surgeon: Coralyn Mark, MD;  Location: [place] CATH LAB;  Service: Cardiovascular;  Laterality: N/A;  . CARDIOVERSION  03/13/2012   Procedure: CARDIOVERSION;  Surgeon:  [name], MD;  Location: [place];  Service: Cardiovascular;  Laterality: N/A;  . CARDIOVERSION N/A 07/26/2012   Procedure: CARDIOVERSION;  Surgeon: Thompson Grayer, MD;  Location: [place];  Service: Cardiovascular;  Laterality: N/A;  . CARDIOVERSION N/A 02/20/2014   Procedure: CARDIOVERSION;  Surgeon: Larey Dresser, MD;  Location: [place];  Service: Cardiovascular;  Laterality: N/A;  . CESAREAN SECTION    . CHOLECYSTECTOMY    . HERNIA REPAIR    . KNEE SURGERY    . neck tumor resection     benign  . TEE WITHOUT CARDIOVERSION  03/13/2012   Procedure: TRANSESOPHAGEAL ECHOCARDIOGRAM (TEE);  Surgeon: [name], MD;  Location: [place];  Service: Cardiovascular;  Laterality: N/A;  Rm 2029  . TEE WITHOUT CARDIOVERSION N/A 09/02/2012   Procedure: TRANSESOPHAGEAL ECHOCARDIOGRAM (TEE);  Surgeon: Josue Hector, MD;  Location: [place];  Service: Cardiovascular;  Laterality: N/A;  . TEE WITHOUT CARDIOVERSION N/A 11/13/2013   Procedure: TRANSESOPHAGEAL ECHOCARDIOGRAM (TEE);  Surgeon: [name], MD;  Location: [facility] ENDOSCOPY;  Service: Cardiovascular;  Laterality: N/A;    Social History   Socioeconomic History  . Marital status: Widowed    Spouse name: Not on file  . Number of children: 3  . Years of education: Not on file  . Highest education level: Not on file  Occupational History  . Not on file  Social Needs  . Financial resource strain: Not on file  . Food insecurity    Worry: Not on file    Inability: Not on file  . Transportation needs    Medical: Not on file    Non-medical: Not on file  Tobacco Use  . Smoking status: Never Smoker  . Smokeless tobacco: Never Used  Substance and Sexual Activity  . Alcohol use: No    Comment: Rarely  . Drug use: No  . Sexual activity: Never  Lifestyle  . Physical activity    Days per week: Not on file    Minutes per session: Not on file  . Stress: Not on file  Relationships  . Social Herbalist on phone: Not on file    Gets together: Not on file    Attends religious service: Not on file    Active member of club or organization: Not on file    Attends meetings of clubs or organizations: Not on file    Relationship status: Not on file  . Intimate partner violence    Fear of current or ex partner: Not on file    Emotionally abused: Not on file    Physically abused: Not on file    Forced sexual activity: Not on file  Other Topics Concern  . Not on file  Social History Narrative   Marital status: widowed since 01/2014 of melanoma; not dating.       Children:  3 children (17,30, 32) and 4 grandchildren and 1 gg. 1  adopted child.       Lives with son.  Lives in [place].        Employment:  Unemployed.       Tobacco: none      Alcohol: none      Drugs: none      Exercise: none    Family History  Problem Relation Age of Onset  . Emphysema Mother   . COPD Mother   . Anxiety disorder Mother   . Cancer Father        lung cancer;  non-smoker  . Heart disease Father 35       AMI  . COPD Sister   . Emphysema Brother   . Cancer Daughter   . Cancer Sister        lung cancer  . Heart disease Sister        AMI  . Diabetes Other   . Thyroid disease Other      Review of Systems  Constitutional: Negative.  Negative for chills and fever.  HENT: Negative.  Negative for congestion and sore throat.   Eyes: Negative.   Respiratory: Negative.  Negative for cough and shortness of breath.   Cardiovascular: Negative.  Negative for chest pain and palpitations.  Gastrointestinal: Negative.  Negative for abdominal pain, blood in stool, diarrhea, nausea and vomiting.  Genitourinary: Negative.   Musculoskeletal: Positive for joint pain (Chronic).       Left foot pain  Skin: Negative.  Negative for rash.  Neurological: Negative.  Negative for dizziness, sensory change, focal weakness and headaches.  All other systems reviewed and are negative.   Vitals:   03/11/19 1146  BP: 126/80  Pulse: (!) 106  Resp: 16  Temp: 98.2 F (36.8 C)  SpO2: 97%    Physical Exam Vitals signs reviewed.  Constitutional:      Appearance: Normal appearance.  HENT:     Head: Normocephalic.  Eyes:     Extraocular Movements: Extraocular movements intact.     Pupils: Pupils are equal, round, and reactive to light.  Neck:     Musculoskeletal: Normal range of motion.  Cardiovascular:     Rate and Rhythm: Normal rate and regular rhythm.     Pulses: Normal pulses.     Heart sounds: Normal heart sounds.  Pulmonary:     Effort: Pulmonary effort is normal.  Musculoskeletal:     Comments: Lower extremities: Left: Mild  distal edema, no erythema or bruising, full range of motion, mild tenderness to foot with moderate palpation.  Neurovascularly intact with excellent capillary refill. Right: Within normal limits  Skin:    General: Skin is warm and dry.     Capillary Refill: Capillary refill takes less than 2 seconds.  Neurological:     General: No focal deficit present.     Mental Status: She is alert and oriented to person, place, and time.     Sensory: No sensory deficit.     Motor: No weakness.     Coordination: Coordination normal.  Psychiatric:        Mood and Affect: Mood normal.        Behavior: Behavior normal.      ASSESSMENT & PLAN: Angela Cummings was seen today for foot swelling.  Diagnoses and all orders for this visit:  Localized swelling of left foot  Need for prophylactic vaccination and inoculation against influenza -     Flu Vaccine QUAD High Dose(Fluad)  Osteoarthritis of left foot, unspecified osteoarthritis type  Primary osteoarthritis involving multiple joints  PAF (paroxysmal atrial fibrillation) ([place])    Patient Instructions       If you have lab work done today you will be contacted with your lab results within the next 2 weeks.  If you have not heard from [place] then please contact us. The fastest way to get your results is to register for My Chart.   IF you received an x-ray today, you will receive an invoice from [facility]. Please contact [facility] at [phone] with questions or concerns regarding your invoice.  IF you received labwork today, you will receive an invoice from [facility]. Please contact [facility] at [phone] with questions or concerns regarding your invoice.   Our billing staff will not be able to assist you with questions regarding bills from these companies.  You will be contacted with the lab results as soon as they are available. The fastest way to get your results is to activate your My Chart account. Instructions are  located on the last page of this paperwork. If you have not heard from [place] regarding the results in 2 weeks, please contact this office.     Osteoarthritis  Osteoarthritis is a type of arthritis that affects tissue that covers the ends of bones in joints (cartilage). Cartilage acts as a cushion between the bones and helps them move smoothly. Osteoarthritis results when cartilage in the joints gets worn down. Osteoarthritis is sometimes called "wear and tear" arthritis. Osteoarthritis is the most common form of arthritis. It often occurs in older people. It is a condition that gets worse over time (a progressive condition). Joints that are most often affected by this condition are in:  Fingers.  Toes.  Hips.  Knees.  Spine, including neck and lower back. What are the causes? This condition is caused by age-related wearing down of cartilage that covers the ends of bones. What increases the risk? The following factors may make you more likely to develop this condition:  Older age.  Being overweight or obese.  Overuse of joints, such as in athletes.  Past injury of a joint.  Past surgery on a joint.  Family history of osteoarthritis. What are the signs or symptoms? The main symptoms of this condition are pain, swelling, and stiffness in the joint. The joint may lose its shape over time. Small pieces of bone or cartilage may break off and float inside of the joint, which may cause more pain and damage to the joint. Small deposits of bone (osteophytes) may grow on the edges of the joint. Other symptoms may include:  A grating or scraping feeling inside the joint when you move it.  Popping or creaking sounds when you move. Symptoms may affect one or more joints. Osteoarthritis in a major joint, such as your knee or hip, can make it painful to walk or exercise. If you have osteoarthritis in your hands, you might not be able to grip items, twist your hand, or control small movements of  your hands and fingers (fine motor skills). How is this diagnosed? This condition may be diagnosed based on:  Your medical history.  A physical exam.  Your symptoms.  X-rays of the affected joint(s).  Blood tests to rule out other types of arthritis. How is this treated? There is no cure for this condition, but treatment can help to control pain and improve joint function. Treatment plans may include:  A prescribed exercise program that allows for rest and joint relief. You may work with a physical therapist.  A weight control plan.  Pain relief techniques, such as: ? Applying heat and cold to the joint. ? Electric pulses delivered to nerve endings under the skin (transcutaneous electrical nerve stimulation, or TENS). ? Massage. ? Certain nutritional supplements.  NSAIDs or prescription medicines to help relieve pain.  Medicine to help relieve pain and inflammation (corticosteroids). This can be given by mouth (orally) or as an injection.  Assistive devices, such as a brace, wrap, splint, specialized glove, or cane.  Surgery, such as: ? An osteotomy. This is done  to reposition the bones and relieve pain or to remove loose pieces of bone and cartilage. ? Joint replacement surgery. You may need this surgery if you have very bad (advanced) osteoarthritis. Follow these instructions at home: Activity  Rest your affected joints as directed by your health care provider.  Do not drive or use heavy machinery while taking prescription pain medicine.  Exercise as directed. Your health care provider or physical therapist may recommend specific types of exercise, such as: ? Strengthening exercises. These are done to strengthen the muscles that support joints that are affected by arthritis. They can be performed with weights or with exercise bands to add resistance. ? Aerobic activities. These are exercises, such as brisk walking or water aerobics, that get your heart pumping. ?  Range-of-motion activities. These keep your joints easy to move. ? Balance and agility exercises. Managing pain, stiffness, and swelling      If directed, apply heat to the affected area as often as told by your health care provider. Use the heat source that your health care provider recommends, such as a moist heat pack or a heating pad. ? If you have a removable assistive device, remove it as told by your health care provider. ? Place a towel between your skin and the heat source. If your health care provider tells you to keep the assistive device on while you apply heat, place a towel between the assistive device and the heat source. ? Leave the heat on for 20-30 minutes. ? Remove the heat if your skin turns bright red. This is especially important if you are unable to feel pain, heat, or cold. You may have a greater risk of getting burned.  If directed, put ice on the affected joint: ? If you have a removable assistive device, remove it as told by your health care provider. ? Put ice in a plastic bag. ? Place a towel between your skin and the bag. If your health care provider tells you to keep the assistive device on during icing, place a towel between the assistive device and the bag. ? Leave the ice on for 20 minutes, 2-3 times a day. General instructions  Take over-the-counter and prescription medicines only as told by your health care provider.  Maintain a healthy weight. Follow instructions from your health care provider for weight control. These may include dietary restrictions.  Do not use any products that contain nicotine or tobacco, such as cigarettes and e-cigarettes. These can delay bone healing. If you need help quitting, ask your health care provider.  Use assistive devices as directed by your health care provider.  Keep all follow-up visits as told by your health care provider. This is important. Where to find more information  [employer] of Arthritis and  Musculoskeletal and Skin Diseases: www.niams.SouthExposed.es  [employer] on Aging: http://kim-miller.com/  American College of Rheumatology: www.rheumatology.org Contact a health care provider if:  Your skin turns red.  You develop a rash.  You have pain that gets worse.  You have a fever along with joint or muscle aches. Get help right away if:  You lose a lot of weight.  You suddenly lose your appetite.  You have night sweats. Summary  Osteoarthritis is a type of arthritis that affects tissue covering the ends of bones in joints (cartilage).  This condition is caused by age-related wearing down of cartilage that covers the ends of bones.  The main symptom of this condition is pain, swelling, and stiffness in the  joint.  There is no cure for this condition, but treatment can help to control pain and improve joint function. This information is not intended to replace advice given to you by your health care provider. Make sure you discuss any questions you have with your health care provider. Document Released: 05/29/2005 Document Revised: 05/11/2017 Document Reviewed: 01/31/2016 Elsevier Patient Education  2020 [employer].      Agustina Caroli, MD [facility]

## 2019-03-11 NOTE — Patient Instructions (Addendum)
If you have lab work done today you will be contacted with your lab results within the next 2 weeks.  If you have not heard from [place] then please contact us. The fastest way to get your results is to register for My Chart.   IF you received an x-ray today, you will receive an invoice from [facility]. Please contact [facility] at [phone] with questions or concerns regarding your invoice.   IF you received labwork today, you will receive an invoice from [facility]. Please contact [facility] at [phone] with questions or concerns regarding your invoice.   Our billing staff will not be able to assist you with questions regarding bills from these companies.  You will be contacted with the lab results as soon as they are available. The fastest way to get your results is to activate your My Chart account. Instructions are located on the last page of this paperwork. If you have not heard from [place] regarding the results in 2 weeks, please contact this office.     Osteoarthritis  Osteoarthritis is a type of arthritis that affects tissue that covers the ends of bones in joints (cartilage). Cartilage acts as a cushion between the bones and helps them move smoothly. Osteoarthritis results when cartilage in the joints gets worn down. Osteoarthritis is sometimes called "wear and tear" arthritis. Osteoarthritis is the most common form of arthritis. It often occurs in older people. It is a condition that gets worse over time (a progressive condition). Joints that are most often affected by this condition are in:  Fingers.  Toes.  Hips.  Knees.  Spine, including neck and lower back. What are the causes? This condition is caused by age-related wearing down of cartilage that covers the ends of bones. What increases the risk? The following factors may make you more likely to develop this condition:  Older age.  Being overweight or obese.  Overuse of joints, such as in  athletes.  Past injury of a joint.  Past surgery on a joint.  Family history of osteoarthritis. What are the signs or symptoms? The main symptoms of this condition are pain, swelling, and stiffness in the joint. The joint may lose its shape over time. Small pieces of bone or cartilage may break off and float inside of the joint, which may cause more pain and damage to the joint. Small deposits of bone (osteophytes) may grow on the edges of the joint. Other symptoms may include:  A grating or scraping feeling inside the joint when you move it.  Popping or creaking sounds when you move. Symptoms may affect one or more joints. Osteoarthritis in a major joint, such as your knee or hip, can make it painful to walk or exercise. If you have osteoarthritis in your hands, you might not be able to grip items, twist your hand, or control small movements of your hands and fingers (fine motor skills). How is this diagnosed? This condition may be diagnosed based on:  Your medical history.  A physical exam.  Your symptoms.  X-rays of the affected joint(s).  Blood tests to rule out other types of arthritis. How is this treated? There is no cure for this condition, but treatment can help to control pain and improve joint function. Treatment plans may include:  A prescribed exercise program that allows for rest and joint relief. You may work with a physical therapist.  A weight control plan.  Pain relief techniques, such as: ? Applying heat and  cold to the joint. ? Electric pulses delivered to nerve endings under the skin (transcutaneous electrical nerve stimulation, or TENS). ? Massage. ? Certain nutritional supplements.  NSAIDs or prescription medicines to help relieve pain.  Medicine to help relieve pain and inflammation (corticosteroids). This can be given by mouth (orally) or as an injection.  Assistive devices, such as a brace, wrap, splint, specialized glove, or cane.  Surgery, such  as: ? An osteotomy. This is done to reposition the bones and relieve pain or to remove loose pieces of bone and cartilage. ? Joint replacement surgery. You may need this surgery if you have very bad (advanced) osteoarthritis. Follow these instructions at home: Activity  Rest your affected joints as directed by your health care provider.  Do not drive or use heavy machinery while taking prescription pain medicine.  Exercise as directed. Your health care provider or physical therapist may recommend specific types of exercise, such as: ? Strengthening exercises. These are done to strengthen the muscles that support joints that are affected by arthritis. They can be performed with weights or with exercise bands to add resistance. ? Aerobic activities. These are exercises, such as brisk walking or water aerobics, that get your heart pumping. ? Range-of-motion activities. These keep your joints easy to move. ? Balance and agility exercises. Managing pain, stiffness, and swelling      If directed, apply heat to the affected area as often as told by your health care provider. Use the heat source that your health care provider recommends, such as a moist heat pack or a heating pad. ? If you have a removable assistive device, remove it as told by your health care provider. ? Place a towel between your skin and the heat source. If your health care provider tells you to keep the assistive device on while you apply heat, place a towel between the assistive device and the heat source. ? Leave the heat on for 20-30 minutes. ? Remove the heat if your skin turns bright red. This is especially important if you are unable to feel pain, heat, or cold. You may have a greater risk of getting burned.  If directed, put ice on the affected joint: ? If you have a removable assistive device, remove it as told by your health care provider. ? Put ice in a plastic bag. ? Place a towel between your skin and the bag.  If your health care provider tells you to keep the assistive device on during icing, place a towel between the assistive device and the bag. ? Leave the ice on for 20 minutes, 2-3 times a day. General instructions  Take over-the-counter and prescription medicines only as told by your health care provider.  Maintain a healthy weight. Follow instructions from your health care provider for weight control. These may include dietary restrictions.  Do not use any products that contain nicotine or tobacco, such as cigarettes and e-cigarettes. These can delay bone healing. If you need help quitting, ask your health care provider.  Use assistive devices as directed by your health care provider.  Keep all follow-up visits as told by your health care provider. This is important. Where to find more information  [employer] of Arthritis and Musculoskeletal and Skin Diseases: www.niams.SouthExposed.es  [employer] on Aging: http://kim-miller.com/  American College of Rheumatology: www.rheumatology.org Contact a health care provider if:  Your skin turns red.  You develop a rash.  You have pain that gets worse.  You have a fever along  with joint or muscle aches. Get help right away if:  You lose a lot of weight.  You suddenly lose your appetite.  You have night sweats. Summary  Osteoarthritis is a type of arthritis that affects tissue covering the ends of bones in joints (cartilage).  This condition is caused by age-related wearing down of cartilage that covers the ends of bones.  The main symptom of this condition is pain, swelling, and stiffness in the joint.  There is no cure for this condition, but treatment can help to control pain and improve joint function. This information is not intended to replace advice given to you by your health care provider. Make sure you discuss any questions you have with your health care provider. Document Released: 05/29/2005 Document Revised:  05/11/2017 Document Reviewed: 01/31/2016 Elsevier Patient Education  2020 Elsevier Inc.  

## 2019-03-12 ENCOUNTER — Other Ambulatory Visit: Payer: Self-pay | Admitting: Internal Medicine

## 2019-03-24 ENCOUNTER — Ambulatory Visit: Payer: Medicare Other | Admitting: Emergency Medicine

## 2019-03-29 ENCOUNTER — Other Ambulatory Visit (HOSPITAL_COMMUNITY): Payer: Self-pay | Admitting: Nurse Practitioner

## 2019-03-31 NOTE — Telephone Encounter (Signed)
This is a A-Fib clinic pt 

## 2019-04-08 ENCOUNTER — Encounter (HOSPITAL_COMMUNITY): Payer: Self-pay | Admitting: Nurse Practitioner

## 2019-04-08 ENCOUNTER — Ambulatory Visit (HOSPITAL_COMMUNITY)
Admission: RE | Admit: 2019-04-08 | Discharge: 2019-04-08 | Disposition: A | Discharge status: Home / Self Care | Payer: Medicare Other | Source: Ambulatory Visit | Attending: Nurse Practitioner | Admitting: Nurse Practitioner

## 2019-04-08 ENCOUNTER — Other Ambulatory Visit: Payer: Self-pay

## 2019-04-08 VITALS — BP 120/80 | HR 116 | Ht 61.0 in | Wt 200.2 lb

## 2019-04-08 DIAGNOSIS — M797 Fibromyalgia: Secondary | ICD-10-CM | POA: Insufficient documentation

## 2019-04-08 DIAGNOSIS — I4819 Other persistent atrial fibrillation: Secondary | ICD-10-CM | POA: Diagnosis present

## 2019-04-08 DIAGNOSIS — F329 Major depressive disorder, single episode, unspecified: Secondary | ICD-10-CM | POA: Insufficient documentation

## 2019-04-08 DIAGNOSIS — F419 Anxiety disorder, unspecified: Secondary | ICD-10-CM | POA: Diagnosis not present

## 2019-04-08 DIAGNOSIS — Z79899 Other long term (current) drug therapy: Secondary | ICD-10-CM | POA: Insufficient documentation

## 2019-04-08 DIAGNOSIS — I480 Paroxysmal atrial fibrillation: Secondary | ICD-10-CM | POA: Diagnosis not present

## 2019-04-08 DIAGNOSIS — M1909 Primary osteoarthritis, other specified site: Secondary | ICD-10-CM | POA: Diagnosis not present

## 2019-04-08 DIAGNOSIS — Z7901 Long term (current) use of anticoagulants: Secondary | ICD-10-CM | POA: Diagnosis not present

## 2019-04-08 LAB — CBC
HCT: 41.1 % (ref 36.0–46.0)
Hemoglobin: 12 g/dL (ref 12.0–15.0)
MCH: 25.3 pg — ABNORMAL LOW (ref 26.0–34.0)
MCHC: 29.2 g/dL — ABNORMAL LOW (ref 30.0–36.0)
MCV: 86.7 fL (ref 80.0–100.0)
Platelets: 318 10*3/uL (ref 150–400)
RBC: 4.74 MIL/uL (ref 3.87–5.11)
RDW: 14.7 % (ref 11.5–15.5)
WBC: 5.8 10*3/uL (ref 4.0–10.5)
nRBC: 0 % (ref 0.0–0.2)

## 2019-04-08 LAB — TSH: TSH: 6.635 u[IU]/mL — ABNORMAL HIGH (ref 0.350–4.500)

## 2019-04-08 LAB — COMPREHENSIVE METABOLIC PANEL
ALT: 31 U/L (ref 0–44)
AST: 28 U/L (ref 15–41)
Albumin: 3.7 g/dL (ref 3.5–5.0)
Alkaline Phosphatase: 180 U/L — ABNORMAL HIGH (ref 38–126)
Anion gap: 10 (ref 5–15)
BUN: 10 mg/dL (ref 8–23)
CO2: 23 mmol/L (ref 22–32)
Calcium: 9.1 mg/dL (ref 8.9–10.3)
Chloride: 106 mmol/L (ref 98–111)
Creatinine, Ser: 1.03 mg/dL — ABNORMAL HIGH (ref 0.44–1.00)
GFR calc Af Amer: >60 mL/min (ref >60)
GFR calc non Af Amer: 57 mL/min — ABNORMAL LOW (ref >60)
Glucose, Bld: 103 mg/dL — ABNORMAL HIGH (ref 70–99)
Potassium: 4.9 mmol/L (ref 3.5–5.1)
Sodium: 139 mmol/L (ref 135–145)
Total Bilirubin: 0.5 mg/dL (ref 0.3–1.2)
Total Protein: 6.7 g/dL (ref 6.5–8.1)

## 2019-04-08 MED ORDER — AMIODARONE HCL 200 MG PO TABS: 200.0000 mg | ORAL_TABLET | Freq: Every day | ORAL | 1 refills | Status: DC

## 2019-04-08 MED ORDER — METOPROLOL SUCCINATE ER 25 MG PO TB24: 12.5000 mg | ORAL_TABLET | Freq: Two times a day (BID) | ORAL | 3 refills | Status: DC

## 2019-04-08 NOTE — Progress Notes (Signed)
Primary Care Physician: Horald Pollen, MD Referring Physician: Dr. [name] is a 63 y.o. female with a h/o persistent afib s/p ablation x2  that failed Tikosyn back in May, after being seen by Dr. [name]  and was started on amiodarone 200 mg bid to f/u in the afib clinic in  3 weeks. Pt failed to come to clinic and had to refuse her refills for her to agree to come in today. She last had a successful cardioversion early May.  Ekg today shows afib at 112 bpm, however, she feels that she is not in afib all the time as she has some days she reports HR's in the 60-70's range. She states that she feels well. She will lower amiodarone to one tab, 200 mg a day. She feels that she has not been taking her metoprolol as she felt her HR was in control. No missed xarelto for at least 3 weeks.  Today, she denies symptoms of palpitations, chest pain, shortness of breath, orthopnea, PND, lower extremity edema, dizziness, presyncope, syncope, or neurologic sequela. The patient is tolerating medications without difficulties and is otherwise without complaint today.   Past Medical History:  Diagnosis Date  . Anxiety   . Asthma   . Atrial fibrillation ([place]) 11/2010   paroxysmal  . Bradycardia   . Depression   . DJD (degenerative joint disease)    Cervical spine  . Fibromyalgia    s/p rheumatology consultation/[facility]  . Mild sleep apnea   . Obesity   . Osteoarthritis    Shoulders, hips,knees   Past Surgical History:  Procedure Laterality Date  . ABLATION  08/2012   PVI by Dr [name]  . ABLATION  11/13/2013   PVI by Dr [name]  . ATRIAL FIBRILLATION ABLATION N/A 09/03/2012   Procedure: ATRIAL FIBRILLATION ABLATION;  Surgeon: Thompson Grayer, MD;  Location: [facility] CATH LAB;  Service: Cardiovascular;  Laterality: N/A;  . ATRIAL FIBRILLATION ABLATION N/A 11/13/2013   Procedure: ATRIAL FIBRILLATION ABLATION;  Surgeon: Coralyn Mark, MD;  Location: [place] CATH LAB;  Service: Cardiovascular;   Laterality: N/A;  . CARDIOVERSION  03/13/2012   Procedure: CARDIOVERSION;  Surgeon: [name], MD;  Location: [facility] ENDOSCOPY;  Service: Cardiovascular;  Laterality: N/A;  . CARDIOVERSION N/A 07/26/2012   Procedure: CARDIOVERSION;  Surgeon: Thompson Grayer, MD;  Location: [place];  Service: Cardiovascular;  Laterality: N/A;  . CARDIOVERSION N/A 02/20/2014   Procedure: CARDIOVERSION;  Surgeon: Larey Dresser, MD;  Location: [place];  Service: Cardiovascular;  Laterality: N/A;  . CESAREAN SECTION    . CHOLECYSTECTOMY    . HERNIA REPAIR    . KNEE SURGERY    . neck tumor resection     benign  . TEE WITHOUT CARDIOVERSION  03/13/2012   Procedure: TRANSESOPHAGEAL ECHOCARDIOGRAM (TEE);  Surgeon: [name], MD;  Location: [place];  Service: Cardiovascular;  Laterality: N/A;  Rm 2029  . TEE WITHOUT CARDIOVERSION N/A 09/02/2012   Procedure: TRANSESOPHAGEAL ECHOCARDIOGRAM (TEE);  Surgeon: Josue Hector, MD;  Location: [place];  Service: Cardiovascular;  Laterality: N/A;  . TEE WITHOUT CARDIOVERSION N/A 11/13/2013   Procedure: TRANSESOPHAGEAL ECHOCARDIOGRAM (TEE);  Surgeon: [name], MD;  Location: [facility] ENDOSCOPY;  Service: Cardiovascular;  Laterality: N/A;    Current Outpatient Medications  Medication Sig Dispense Refill  . acetaminophen (TYLENOL) 500 MG tablet Take by mouth as needed.     Marland Kitchen amiodarone (PACERONE) 200 MG tablet TAKE 1 TABLET BY MOUTH TWICE A  DAY 180 tablet 1  . busPIRone (BUSPAR) 7.5 MG tablet TAKE 1 TABLET (7.5 MG TOTAL) BY MOUTH 2 (TWO) TIMES DAILY. 180 tablet 0  . Diclofenac-miSOPROStol 75-0.2 MG TBEC TAKE 1 TABLET BY MOUTH TWICE A DAY 60 tablet 1  . diltiazem (TIAZAC) 240 MG 24 hr capsule Take 1 capsule (240 mg total) by mouth daily. appt required for refills [phone] 15 capsule 0  . KLOR-CON M20 20 MEQ tablet TAKE 2 TABLETS (40 MEQ TOTAL) 2 (TWO) TIMES DAILY BY MOUTH. 360 tablet 2  . LORazepam (ATIVAN) 1 MG tablet as needed.     . metoCLOPramide (REGLAN)  10 MG tablet daily. Taking one tablet daily    . metoprolol succinate (TOPROL-XL) 25 MG 24 hr tablet Take 1 tablet (25 mg total) by mouth 2 (two) times daily. (Patient taking differently: Take 25 mg by mouth as needed. ) 60 tablet 3  . pantoprazole (PROTONIX) 40 MG tablet TAKE 1 TABLET BY MOUTH TWICE A DAY 60 tablet 0  . PROVENTIL HFA 108 (90 Base) MCG/ACT inhaler INHALE 2 PUFFS INTO THE LUNGS EVERY 6 HOURS AS NEEDED 6.7 g 0  . XARELTO 20 MG TABS tablet TAKE 1 TABLET EVERY DAY WITH SUPPER 90 tablet 2   No current facility-administered medications for this encounter.     Allergies  Allergen Reactions  . Adhesive [Tape] Itching and Rash    Paper tape is better but best to use no adhesive if possible    Social History   Socioeconomic History  . Marital status: Widowed    Spouse name: Not on file  . Number of children: 3  . Years of education: Not on file  . Highest education level: Not on file  Occupational History  . Not on file  Social Needs  . Financial resource strain: Not on file  . Food insecurity    Worry: Not on file    Inability: Not on file  . Transportation needs    Medical: Not on file    Non-medical: Not on file  Tobacco Use  . Smoking status: Never Smoker  . Smokeless tobacco: Never Used  Substance and Sexual Activity  . Alcohol use: No    Comment: Rarely  . Drug use: No  . Sexual activity: Never  Lifestyle  . Physical activity    Days per week: Not on file    Minutes per session: Not on file  . Stress: Not on file  Relationships  . Social Herbalist on phone: Not on file    Gets together: Not on file    Attends religious service: Not on file    Active member of club or organization: Not on file    Attends meetings of clubs or organizations: Not on file    Relationship status: Not on file  . Intimate partner violence    Fear of current or ex partner: Not on file    Emotionally abused: Not on file    Physically abused: Not on file     Forced sexual activity: Not on file  Other Topics Concern  . Not on file  Social History Narrative   Marital status: widowed since 01/2014 of melanoma; not dating.       Children:  3 children (17,30, 32) and 4 grandchildren and 1 gg. 1 adopted child.       Lives with son.  Lives in [place].        Employment:  Unemployed.  Tobacco: none      Alcohol: none      Drugs: none      Exercise: none    Family History  Problem Relation Age of Onset  . Emphysema Mother   . COPD Mother   . Anxiety disorder Mother   . Cancer Father        lung cancer; non-smoker  . Heart disease Father 106       AMI  . COPD Sister   . Emphysema Brother   . Cancer Daughter   . Cancer Sister        lung cancer  . Heart disease Sister        AMI  . Diabetes Other   . Thyroid disease Other     ROS- All systems are reviewed and negative except as per the HPI above  Physical Exam: There were no vitals filed for this visit. Wt Readings from Last 3 Encounters:  03/11/19 93.5 kg  12/25/18 88 kg  11/01/18 87.1 kg    Labs: Lab Results  Component Value Date   NA 139 10/24/2018   K 4.1 10/24/2018   CL 107 10/24/2018   CO2 22 10/24/2018   GLUCOSE 96 10/24/2018   BUN 9 10/24/2018   CREATININE 0.99 10/24/2018   CALCIUM 9.2 10/24/2018   MG 2.2 10/24/2018   Lab Results  Component Value Date   INR 1.05 07/25/2012   Lab Results  Component Value Date   CHOL 169 03/14/2015   HDL 71 03/14/2015   LDLCALC 79 03/14/2015   TRIG 94 03/14/2015     GEN- The patient is well appearing, alert and oriented x 3 today.   Head- normocephalic, atraumatic Eyes-  Sclera clear, conjunctiva pink Ears- hearing intact Oropharynx- clear Neck- supple, no JVP Lymph- no cervical lymphadenopathy Lungs- Clear to ausculation bilaterally, normal work of breathing Heart- irregular rate and rhythm, no murmurs, rubs or gallops, PMI not laterally displaced GI- soft, NT, ND, + BS Extremities- no clubbing, cyanosis,  or edema MS- no significant deformity or atrophy Skin- no rash or lesion Psych- euthymic mood, full affect Neuro- strength and sensation are intact  EKG-afib at 116 bpm, qrs int 124 ms, qtc 500 ms Epic records reviewed   Assessment and Plan: 1. Afib  Has been on amiodarone 200 mg bid since loading in May as pt failed to come in for 3 week f/u She does not feel she is in afib all the time , ekg today shows that she is States she has been feeling well Will lower amiodarone to 200 mg day starting today  Take metoprolol 12. 5 mg bid, pt taking off and on  Will place a 5 day zio patch and if she is persistent will plan for cardioversion Cmet/tsh/cbc today   2.CHA2DS2VASc score is 2 Continue Xarelto 20 mg a day States no missed doses   Will be back in touch after monitor reviewed  Angela Cummings, [facility] [address] [place], [place] [address] [phone]

## 2019-04-08 NOTE — Patient Instructions (Signed)
Decrease amiodarone to 200mg  once a day  Decrease metoprolol to 1/2 tablet twice a day  Take off monitor and mail in on November 1st.  Follow up once monitor results known.

## 2019-04-09 ENCOUNTER — Other Ambulatory Visit (HOSPITAL_COMMUNITY): Payer: Self-pay | Admitting: Emergency Medicine

## 2019-04-10 ENCOUNTER — Other Ambulatory Visit (HOSPITAL_COMMUNITY): Payer: Self-pay | Admitting: Nurse Practitioner

## 2019-04-23 ENCOUNTER — Other Ambulatory Visit (HOSPITAL_COMMUNITY): Payer: Self-pay | Admitting: Emergency Medicine

## 2019-04-23 NOTE — Telephone Encounter (Signed)
Requested medication (s) are due for refill today: yes  Requested medication (s) are on the active medication list: yes  Last refill:  04/09/2019  Future visit scheduled: yes  Notes to clinic: requesting 90 day supply   Requested Prescriptions  Pending Prescriptions Disp Refills   pantoprazole (PROTONIX) 40 MG tablet [Pharmacy Med Name: PANTOPRAZOLE SOD DR 40 MG TAB] 180 tablet 1    Sig: TAKE 1 TABLET BY MOUTH TWICE A DAY     There is no refill protocol information for this order

## 2019-04-28 NOTE — Addendum Note (Signed)
Encounter addended by: Juluis Mire, RN on: 04/28/2019 9:10 AM  Actions taken: Imaging Exam ended

## 2019-04-30 ENCOUNTER — Other Ambulatory Visit: Payer: Self-pay

## 2019-04-30 ENCOUNTER — Ambulatory Visit (HOSPITAL_COMMUNITY)
Admission: RE | Admit: 2019-04-30 | Discharge: 2019-04-30 | Disposition: A | Discharge status: Home / Self Care | Payer: Medicare Other | Source: Ambulatory Visit | Attending: Nurse Practitioner | Admitting: Nurse Practitioner

## 2019-04-30 ENCOUNTER — Encounter (HOSPITAL_COMMUNITY): Payer: Self-pay | Admitting: Nurse Practitioner

## 2019-04-30 VITALS — BP 132/68 | HR 54 | Ht 61.0 in | Wt 202.4 lb

## 2019-04-30 DIAGNOSIS — G4730 Sleep apnea, unspecified: Secondary | ICD-10-CM | POA: Insufficient documentation

## 2019-04-30 DIAGNOSIS — D6869 Other thrombophilia: Secondary | ICD-10-CM | POA: Diagnosis not present

## 2019-04-30 DIAGNOSIS — E669 Obesity, unspecified: Secondary | ICD-10-CM | POA: Diagnosis not present

## 2019-04-30 DIAGNOSIS — I4892 Unspecified atrial flutter: Secondary | ICD-10-CM | POA: Diagnosis not present

## 2019-04-30 DIAGNOSIS — Z8249 Family history of ischemic heart disease and other diseases of the circulatory system: Secondary | ICD-10-CM | POA: Insufficient documentation

## 2019-04-30 DIAGNOSIS — M797 Fibromyalgia: Secondary | ICD-10-CM | POA: Insufficient documentation

## 2019-04-30 DIAGNOSIS — Z79899 Other long term (current) drug therapy: Secondary | ICD-10-CM | POA: Diagnosis not present

## 2019-04-30 DIAGNOSIS — Z7901 Long term (current) use of anticoagulants: Secondary | ICD-10-CM | POA: Diagnosis not present

## 2019-04-30 DIAGNOSIS — F419 Anxiety disorder, unspecified: Secondary | ICD-10-CM | POA: Insufficient documentation

## 2019-04-30 DIAGNOSIS — Z825 Family history of asthma and other chronic lower respiratory diseases: Secondary | ICD-10-CM | POA: Insufficient documentation

## 2019-04-30 DIAGNOSIS — I4819 Other persistent atrial fibrillation: Secondary | ICD-10-CM | POA: Diagnosis not present

## 2019-04-30 DIAGNOSIS — Z6838 Body mass index (BMI) 38.0-38.9, adult: Secondary | ICD-10-CM | POA: Insufficient documentation

## 2019-04-30 LAB — TSH: TSH: 5.192 u[IU]/mL — ABNORMAL HIGH (ref 0.350–4.500)

## 2019-04-30 LAB — HEPATIC FUNCTION PANEL
ALT: 30 U/L (ref 0–44)
AST: 27 U/L (ref 15–41)
Albumin: 3.7 g/dL (ref 3.5–5.0)
Alkaline Phosphatase: 141 U/L — ABNORMAL HIGH (ref 38–126)
Bilirubin, Direct: <0.1 mg/dL (ref 0.0–0.2)
Total Bilirubin: 0.5 mg/dL (ref 0.3–1.2)
Total Protein: 6.7 g/dL (ref 6.5–8.1)

## 2019-04-30 NOTE — Progress Notes (Signed)
Primary Care Physician: Angela Pollen, MD Referring Physician: Dr. [name] is a 63 y.o. female with a h/o persistent afib s/p ablation x2  that failed Tikosyn back in May, after being seen by Angela Cummings MAy 2020  and was started on amiodarone 200 mg bid to f/u in the afib clinic in  3 weeks. Pt failed to come to clinic and had to refuse her refills for her to agree to come in today. She has been taking 200 mg bid of amiodarone since May.  She last had a successful cardioversion early May.  Ekg today shows afib at 112 bpm, however, she feels that she is not in afib all the time as she has some days she reports HR's in the 60-70's range. She states that she feels well. She will lower amiodarone to one tab, 200 mg a day. She feels that she has not been taking her metoprolol as she felt her HR was in control. No missed xarelto for at least 3 weeks.  Pt is back in afib clinic, 11/18 after wearing a zio patch was found  to being out of rhythm  51% while wearing the monitor, However, she is in SR today. She is on amiodarone 200 mg daily for several weeks. Her Alk phos and TSH were elevated on last visit and will repeat  labs today .   Today, she denies symptoms of palpitations, chest pain, shortness of breath, orthopnea, PND, lower extremity edema, dizziness, presyncope, syncope, or neurologic sequela. The patient is tolerating medications without difficulties and is otherwise without complaint today.   Past Medical History:  Diagnosis Date  . Anxiety   . Asthma   . Atrial fibrillation ([place]) 11/2010   paroxysmal  . Bradycardia   . Depression   . DJD (degenerative joint disease)    Cervical spine  . Fibromyalgia    s/p rheumatology consultation/Zeminsky  . Mild sleep apnea   . Obesity   . Osteoarthritis    Shoulders, hips,knees   Past Surgical History:  Procedure Laterality Date  . ABLATION  08/2012   PVI by Dr Angela Cummings  . ABLATION  11/13/2013   PVI by Dr Angela Cummings  .  ATRIAL FIBRILLATION ABLATION N/A 09/03/2012   Procedure: ATRIAL FIBRILLATION ABLATION;  Surgeon: Angela Grayer, MD;  Location: [facility] CATH LAB;  Service: Cardiovascular;  Laterality: N/A;  . ATRIAL FIBRILLATION ABLATION N/A 11/13/2013   Procedure: ATRIAL FIBRILLATION ABLATION;  Surgeon: Angela Mark, MD;  Location: [place] CATH LAB;  Service: Cardiovascular;  Laterality: N/A;  . CARDIOVERSION  03/13/2012   Procedure: CARDIOVERSION;  Surgeon: Angela Headings, MD;  Location: [facility] ENDOSCOPY;  Service: Cardiovascular;  Laterality: N/A;  . CARDIOVERSION N/A 07/26/2012   Procedure: CARDIOVERSION;  Surgeon: Angela Grayer, MD;  Location: [place];  Service: Cardiovascular;  Laterality: N/A;  . CARDIOVERSION N/A 02/20/2014   Procedure: CARDIOVERSION;  Surgeon: Angela Dresser, MD;  Location: [place];  Service: Cardiovascular;  Laterality: N/A;  . CESAREAN SECTION    . CHOLECYSTECTOMY    . HERNIA REPAIR    . KNEE SURGERY    . neck tumor resection     benign  . TEE WITHOUT CARDIOVERSION  03/13/2012   Procedure: TRANSESOPHAGEAL ECHOCARDIOGRAM (TEE);  Surgeon: Angela Headings, MD;  Location: [place];  Service: Cardiovascular;  Laterality: N/A;  Rm 2029  . TEE WITHOUT CARDIOVERSION N/A 09/02/2012   Procedure: TRANSESOPHAGEAL ECHOCARDIOGRAM (TEE);  Surgeon: Angela Hector, MD;  Location: [place];  Service: Cardiovascular;  Laterality: N/A;  . TEE WITHOUT CARDIOVERSION N/A 11/13/2013   Procedure: TRANSESOPHAGEAL ECHOCARDIOGRAM (TEE);  Surgeon: Angela Headings, MD;  Location[facility] ENDOSCOPY;  Service: Cardiovascular;  Laterality: N/A;    Current Outpatient Medications  Medication Sig Dispense Refill  . acetaminophen (TYLENOL) 500 MG tablet Take by mouth as needed.     Marland Kitchen amiodarone (PACERONE) 200 MG tablet Take 1 tablet (200 mg total) by mouth daily. 90 tablet 1  . Diclofenac-miSOPROStol 75-0.2 MG TBEC TAKE 1 TABLET BY MOUTH TWICE A DAY 60 tablet 1  . KLOR-CON M20 20 MEQ tablet TAKE 2 TABLETS (40 MEQ TOTAL) 2 (TWO)  TIMES DAILY BY MOUTH. 360 tablet 2  . LORazepam (ATIVAN) 1 MG tablet as needed.     . metoprolol succinate (TOPROL-XL) 25 MG 24 hr tablet Take 0.5 tablets (12.5 mg total) by mouth 2 (two) times daily. 60 tablet 3  . pantoprazole (PROTONIX) 40 MG tablet TAKE 1 TABLET BY MOUTH TWICE A DAY 180 tablet 0  . PROVENTIL HFA 108 (90 Base) MCG/ACT inhaler INHALE 2 PUFFS INTO THE LUNGS EVERY 6 HOURS AS NEEDED 6.7 g 0  . TIADYLT ER 240 MG 24 hr capsule TAKE 1 CAPSULE (240 MG TOTAL) BY MOUTH DAILY. APPT REQUIRED FOR REFILLS [phone] 30 capsule 1  . XARELTO 20 MG TABS tablet TAKE 1 TABLET EVERY DAY WITH SUPPER 90 tablet 2   No current facility-administered medications for this encounter.     Allergies  Allergen Reactions  . Adhesive [Tape] Itching and Rash    Paper tape is better but best to use no adhesive if possible    Social History   Socioeconomic History  . Marital status: Widowed    Spouse name: Not on file  . Number of children: 3  . Years of education: Not on file  . Highest education level: Not on file  Occupational History  . Not on file  Social Needs  . Financial resource strain: Not on file  . Food insecurity    Worry: Not on file    Inability: Not on file  . Transportation needs    Medical: Not on file    Non-medical: Not on file  Tobacco Use  . Smoking status: Never Smoker  . Smokeless tobacco: Never Used  Substance and Sexual Activity  . Alcohol use: No    Comment: Rarely  . Drug use: No  . Sexual activity: Never  Lifestyle  . Physical activity    Days per week: Not on file    Minutes per session: Not on file  . Stress: Not on file  Relationships  . Social Herbalist on phone: Not on file    Gets together: Not on file    Attends religious service: Not on file    Active member of club or organization: Not on file    Attends meetings of clubs or organizations: Not on file    Relationship status: Not on file  . Intimate partner violence    Fear of  current or ex partner: Not on file    Emotionally abused: Not on file    Physically abused: Not on file    Forced sexual activity: Not on file  Other Topics Concern  . Not on file  Social History Narrative   Marital status: widowed since 01/2014 of melanoma; not dating.       Children:  3 children (17,30, 32) and 4 grandchildren and 1 gg. 1 adopted child.  Lives with son.  Lives in [place].        Employment:  Unemployed.       Tobacco: none      Alcohol: none      Drugs: none      Exercise: none    Family History  Problem Relation Age of Onset  . Emphysema Mother   . COPD Mother   . Anxiety disorder Mother   . Cancer Father        lung cancer; non-smoker  . Heart disease Father 79       AMI  . COPD Sister   . Emphysema Brother   . Cancer Daughter   . Cancer Sister        lung cancer  . Heart disease Sister        AMI  . Diabetes Other   . Thyroid disease Other     ROS- All systems are reviewed and negative except as per the HPI above  Physical Exam: Vitals:   04/30/19 0953  BP: 132/68  Pulse: (!) 54  Weight: 91.8 kg  Height: 5' 1"  (1.549 m)   Wt Readings from Last 3 Encounters:  04/30/19 91.8 kg  04/08/19 90.8 kg  03/11/19 93.5 kg    Labs: Lab Results  Component Value Date   NA 139 04/08/2019   K 4.9 04/08/2019   CL 106 04/08/2019   CO2 23 04/08/2019   GLUCOSE 103 (H) 04/08/2019   BUN 10 04/08/2019   CREATININE 1.03 (H) 04/08/2019   CALCIUM 9.1 04/08/2019   MG 2.2 10/24/2018   Lab Results  Component Value Date   INR 1.05 07/25/2012   Lab Results  Component Value Date   CHOL 169 03/14/2015   HDL 71 03/14/2015   LDLCALC 79 03/14/2015   TRIG 94 03/14/2015     GEN- The patient is well appearing, alert and oriented x 3 today.   Head- normocephalic, atraumatic Eyes-  Sclera clear, conjunctiva pink Ears- hearing intact Oropharynx- clear Neck- supple, no JVP Lymph- no cervical lymphadenopathy Lungs- Clear to ausculation  bilaterally, normal work of breathing Heart- irregular rate and rhythm, no murmurs, rubs or gallops, PMI not laterally displaced GI- soft, NT, ND, + BS Extremities- no clubbing, cyanosis, or edema MS- no significant deformity or atrophy Skin- no rash or lesion Psych- euthymic mood, full affect Neuro- strength and sensation are intact  EKG-afib at 116 bpm, qrs int 124 ms, qtc 500 ms Epic records reviewed Monitor-Patient had a min HR of 42 bpm, max HR of 147 bpm, and avg HR of 84 bpm. Predominant underlying rhythm was Atrial Flutter. First Degree AV Block was present. Atrial Flutter occurred (51% burden), ranging from 65-147 bpm (avg of 100 bpm), the longest lasting 13 hours 19 mins with an avg rate of 99 bpm. Atrial Flutter may be possible Atrial Tachycardia with variable block. 3 Pauses occurred, the longest lasting 3.9 secs (15 bpm). Atrial Flutter was detected within +/- 45 seconds of symptomatic patient event(s). Isolated SVEs were rare (<1.0%), SVE Couplets were rare (<1.0%), and SVE Triplets were rare (<1.0%). Isolated VEs were rare (<1.0%), and no VE Couplets or VE Triplets were present. Difficulty discerning atrial activity making definitive diagnosis difficult to ascertain  Assessment and Plan: 1. Afib  Has been on amiodarone 200 mg bid since loading in May as pt failed to come in for 3 week f/u She does not feel she is in afib all the time , ekg today shows that she is  not but recent monitor did show  aflutterat 51% burden. States she has been feeling well Continue amiodarone   200 mg day   Take metoprolol 12. 5 mg bid  Will bring back in 6 weeks and if she still feels she has a high degree of afib, she may want to further discuss ablation with Angela Cummings She has previously not wanted to go down that path Liver panel/tsh today   2.CHA2DS2VASc score is 2 Continue Xarelto 20 mg a day  F/u in 6 weeks  Angela Penny C. , [facility] [address] [place], [place] [address] [phone]

## 2019-06-11 ENCOUNTER — Ambulatory Visit (HOSPITAL_COMMUNITY)
Admission: RE | Admit: 2019-06-11 | Discharge: 2019-06-11 | Disposition: A | Discharge status: Home / Self Care | Payer: Medicare Other | Source: Ambulatory Visit | Attending: Nurse Practitioner | Admitting: Nurse Practitioner

## 2019-06-11 ENCOUNTER — Encounter (HOSPITAL_COMMUNITY): Payer: Self-pay | Admitting: Nurse Practitioner

## 2019-06-11 ENCOUNTER — Other Ambulatory Visit: Payer: Self-pay

## 2019-06-11 VITALS — BP 112/60 | HR 99 | Ht 61.0 in | Wt 206.2 lb

## 2019-06-11 DIAGNOSIS — I440 Atrioventricular block, first degree: Secondary | ICD-10-CM | POA: Insufficient documentation

## 2019-06-11 DIAGNOSIS — Z833 Family history of diabetes mellitus: Secondary | ICD-10-CM | POA: Insufficient documentation

## 2019-06-11 DIAGNOSIS — Z836 Family history of other diseases of the respiratory system: Secondary | ICD-10-CM | POA: Insufficient documentation

## 2019-06-11 DIAGNOSIS — Z8249 Family history of ischemic heart disease and other diseases of the circulatory system: Secondary | ICD-10-CM | POA: Diagnosis not present

## 2019-06-11 DIAGNOSIS — I4510 Unspecified right bundle-branch block: Secondary | ICD-10-CM | POA: Insufficient documentation

## 2019-06-11 DIAGNOSIS — Z888 Allergy status to other drugs, medicaments and biological substances status: Secondary | ICD-10-CM | POA: Insufficient documentation

## 2019-06-11 DIAGNOSIS — Z801 Family history of malignant neoplasm of trachea, bronchus and lung: Secondary | ICD-10-CM | POA: Insufficient documentation

## 2019-06-11 DIAGNOSIS — M19019 Primary osteoarthritis, unspecified shoulder: Secondary | ICD-10-CM | POA: Insufficient documentation

## 2019-06-11 DIAGNOSIS — M1710 Unilateral primary osteoarthritis, unspecified knee: Secondary | ICD-10-CM | POA: Insufficient documentation

## 2019-06-11 DIAGNOSIS — I4892 Unspecified atrial flutter: Secondary | ICD-10-CM | POA: Insufficient documentation

## 2019-06-11 DIAGNOSIS — Z6838 Body mass index (BMI) 38.0-38.9, adult: Secondary | ICD-10-CM | POA: Diagnosis not present

## 2019-06-11 DIAGNOSIS — I4819 Other persistent atrial fibrillation: Secondary | ICD-10-CM | POA: Diagnosis present

## 2019-06-11 DIAGNOSIS — R9431 Abnormal electrocardiogram [ECG] [EKG]: Secondary | ICD-10-CM | POA: Diagnosis not present

## 2019-06-11 DIAGNOSIS — Z791 Long term (current) use of non-steroidal anti-inflammatories (NSAID): Secondary | ICD-10-CM | POA: Diagnosis not present

## 2019-06-11 DIAGNOSIS — M797 Fibromyalgia: Secondary | ICD-10-CM | POA: Diagnosis not present

## 2019-06-11 DIAGNOSIS — M1610 Unilateral primary osteoarthritis, unspecified hip: Secondary | ICD-10-CM | POA: Insufficient documentation

## 2019-06-11 DIAGNOSIS — F419 Anxiety disorder, unspecified: Secondary | ICD-10-CM | POA: Diagnosis not present

## 2019-06-11 DIAGNOSIS — E669 Obesity, unspecified: Secondary | ICD-10-CM | POA: Diagnosis not present

## 2019-06-11 DIAGNOSIS — Z9049 Acquired absence of other specified parts of digestive tract: Secondary | ICD-10-CM | POA: Diagnosis not present

## 2019-06-11 DIAGNOSIS — I480 Paroxysmal atrial fibrillation: Secondary | ICD-10-CM

## 2019-06-11 DIAGNOSIS — Z79899 Other long term (current) drug therapy: Secondary | ICD-10-CM | POA: Diagnosis not present

## 2019-06-11 DIAGNOSIS — J45909 Unspecified asthma, uncomplicated: Secondary | ICD-10-CM | POA: Insufficient documentation

## 2019-06-11 DIAGNOSIS — Z7901 Long term (current) use of anticoagulants: Secondary | ICD-10-CM | POA: Diagnosis not present

## 2019-06-11 DIAGNOSIS — D6869 Other thrombophilia: Secondary | ICD-10-CM

## 2019-06-11 DIAGNOSIS — F329 Major depressive disorder, single episode, unspecified: Secondary | ICD-10-CM | POA: Diagnosis not present

## 2019-06-11 DIAGNOSIS — G4730 Sleep apnea, unspecified: Secondary | ICD-10-CM | POA: Insufficient documentation

## 2019-06-11 LAB — COMPREHENSIVE METABOLIC PANEL
ALT: 80 U/L — ABNORMAL HIGH (ref 0–44)
AST: 115 U/L — ABNORMAL HIGH (ref 15–41)
Albumin: 3.7 g/dL (ref 3.5–5.0)
Alkaline Phosphatase: 166 U/L — ABNORMAL HIGH (ref 38–126)
Anion gap: 7 (ref 5–15)
BUN: 16 mg/dL (ref 8–23)
CO2: 26 mmol/L (ref 22–32)
Calcium: 9 mg/dL (ref 8.9–10.3)
Chloride: 107 mmol/L (ref 98–111)
Creatinine, Ser: 0.89 mg/dL (ref 0.44–1.00)
GFR calc Af Amer: >60 mL/min (ref >60)
GFR calc non Af Amer: >60 mL/min (ref >60)
Glucose, Bld: 85 mg/dL (ref 70–99)
Potassium: 4.7 mmol/L (ref 3.5–5.1)
Sodium: 140 mmol/L (ref 135–145)
Total Bilirubin: 0.5 mg/dL (ref 0.3–1.2)
Total Protein: 6.9 g/dL (ref 6.5–8.1)

## 2019-06-11 LAB — TSH: TSH: 7.048 u[IU]/mL — ABNORMAL HIGH (ref 0.350–4.500)

## 2019-06-11 MED ORDER — AMIODARONE HCL 200 MG PO TABS: ORAL_TABLET | ORAL | 1 refills | Status: DC

## 2019-06-11 NOTE — Progress Notes (Signed)
Primary Care Physician: Horald Pollen, MD Referring Physician: Dr. [name] is a 63 y.o. female with a h/o persistent afib s/p ablation x2  that failed Tikosyn back in May, after being seen by Dr. [name] MAy 2020  and was started on amiodarone 200 mg bid to f/u in the afib clinic in  3 weeks. Pt failed to come to clinic and had to refuse her refills for her to agree to come in today. She has been taking 200 mg bid of amiodarone since May.  She last had a successful cardioversion early May.  Ekg today shows afib at 112 bpm, however, she feels that she is not in afib all the time as she has some days she reports HR's in the 60-70's range. She states that she feels well. She will lower amiodarone to one tab, 200 mg a day. She feels that she has not been taking her metoprolol as she felt her HR was in control. No missed xarelto for at least 3 weeks.  Pt is back in afib clinic, 11/18 after wearing a zio patch was found  to being out of rhythm  51% while wearing the monitor, However, she is in SR today. She is on amiodarone 200 mg daily for several weeks. Her Alk phos and TSH were elevated on last visit and will repeat  labs today .    F/u in afib clinic 06/11/19. She is in SR but has developed a long  first degree AV block and also has a known RBBB. Continues on 200 mg amiodarone daily as well as 12.5 mg metoprolol succinate bid.   Discussed with Dr. [name], She has not noted any afib. He suggested to stop BB and decrease amiodarone to 100 mg daily.CHA2DS2VASc score of 2.   Today, she denies symptoms of palpitations, chest pain, shortness of breath, orthopnea, PND, lower extremity edema, dizziness, presyncope, syncope, or neurologic sequela. The patient is tolerating medications without difficulties and is otherwise without complaint today.   Past Medical History:  Diagnosis Date  . Anxiety   . Asthma   . Atrial fibrillation ([place]) 11/2010   paroxysmal  . Bradycardia   .  Depression   . DJD (degenerative joint disease)    Cervical spine  . Fibromyalgia    s/p rheumatology consultation/Zeminsky  . Mild sleep apnea   . Obesity   . Osteoarthritis    Shoulders, hips,knees   Past Surgical History:  Procedure Laterality Date  . ABLATION  08/2012   PVI by Dr [name]  . ABLATION  11/13/2013   PVI by Dr [name]  . ATRIAL FIBRILLATION ABLATION N/A 09/03/2012   Procedure: ATRIAL FIBRILLATION ABLATION;  Surgeon: Thompson Grayer, MD;  Location: [facility] CATH LAB;  Service: Cardiovascular;  Laterality: N/A;  . ATRIAL FIBRILLATION ABLATION N/A 11/13/2013   Procedure: ATRIAL FIBRILLATION ABLATION;  Surgeon: Coralyn Mark, MD;  Location: [place] CATH LAB;  Service: Cardiovascular;  Laterality: N/A;  . CARDIOVERSION  03/13/2012   Procedure: CARDIOVERSION;  Surgeon: [name], MD;  Location: [facility] ENDOSCOPY;  Service: Cardiovascular;  Laterality: N/A;  . CARDIOVERSION N/A 07/26/2012   Procedure: CARDIOVERSION;  Surgeon: Thompson Grayer, MD;  Location: [place];  Service: Cardiovascular;  Laterality: N/A;  . CARDIOVERSION N/A 02/20/2014   Procedure: CARDIOVERSION;  Surgeon: Larey Dresser, MD;  Location: [employer];  Service: Cardiovascular;  Laterality: N/A;  . CESAREAN SECTION    . CHOLECYSTECTOMY    . HERNIA REPAIR    .  KNEE SURGERY    . neck tumor resection     benign  . TEE WITHOUT CARDIOVERSION  03/13/2012   Procedure: TRANSESOPHAGEAL ECHOCARDIOGRAM (TEE);  Surgeon: [name], MD;  Location: [place];  Service: Cardiovascular;  Laterality: N/A;  Rm 2029  . TEE WITHOUT CARDIOVERSION N/A 09/02/2012   Procedure: TRANSESOPHAGEAL ECHOCARDIOGRAM (TEE);  Surgeon: Josue Hector, MD;  Location: [place];  Service: Cardiovascular;  Laterality: N/A;  . TEE WITHOUT CARDIOVERSION N/A 11/13/2013   Procedure: TRANSESOPHAGEAL ECHOCARDIOGRAM (TEE);  Surgeon: [name], MD;  Location: [facility] ENDOSCOPY;  Service: Cardiovascular;  Laterality: N/A;    Current Outpatient Medications    Medication Sig Dispense Refill  . acetaminophen (TYLENOL) 500 MG tablet Take by mouth as needed.     Marland Kitchen amiodarone (PACERONE) 200 MG tablet Take 1 tablet (200 mg total) by mouth daily. 90 tablet 1  . Diclofenac-miSOPROStol 75-0.2 MG TBEC TAKE 1 TABLET BY MOUTH TWICE A DAY 60 tablet 1  . KLOR-CON M20 20 MEQ tablet TAKE 2 TABLETS (40 MEQ TOTAL) 2 (TWO) TIMES DAILY BY MOUTH. 360 tablet 2  . LORazepam (ATIVAN) 1 MG tablet as needed.     . metoprolol succinate (TOPROL-XL) 25 MG 24 hr tablet Take 0.5 tablets (12.5 mg total) by mouth 2 (two) times daily. 60 tablet 3  . pantoprazole (PROTONIX) 40 MG tablet TAKE 1 TABLET BY MOUTH TWICE A DAY 180 tablet 0  . PROVENTIL HFA 108 (90 Base) MCG/ACT inhaler INHALE 2 PUFFS INTO THE LUNGS EVERY 6 HOURS AS NEEDED 6.7 g 0  . TIADYLT ER 240 MG 24 hr capsule TAKE 1 CAPSULE (240 MG TOTAL) BY MOUTH DAILY. APPT REQUIRED FOR REFILLS [phone] 30 capsule 1  . XARELTO 20 MG TABS tablet TAKE 1 TABLET EVERY DAY WITH SUPPER 90 tablet 2   No current facility-administered medications for this encounter.    Allergies  Allergen Reactions  . Adhesive [Tape] Itching and Rash    Paper tape is better but best to use no adhesive if possible    Social History   Socioeconomic History  . Marital status: Widowed    Spouse name: Not on file  . Number of children: 3  . Years of education: Not on file  . Highest education level: Not on file  Occupational History  . Not on file  Tobacco Use  . Smoking status: Never Smoker  . Smokeless tobacco: Never Used  Substance and Sexual Activity  . Alcohol use: No    Comment: Rarely  . Drug use: No  . Sexual activity: Never  Other Topics Concern  . Not on file  Social History Narrative   Marital status: widowed since 01/2014 of melanoma; not dating.       Children:  3 children (17,30, 32) and 4 grandchildren and 1 gg. 1 adopted child.       Lives with son.  Lives in [place].        Employment:  Unemployed.       Tobacco:  none      Alcohol: none      Drugs: none      Exercise: none   Social Determinants of Health   Financial Resource Strain:   . Difficulty of Paying Living Expenses: Not on file  Food Insecurity:   . Worried About Charity fundraiser in the Last Year: Not on file  . Ran Out of Food in the Last Year: Not on file  Transportation Needs:   . Lack  of Transportation (Medical): Not on file  . Lack of Transportation (Non-Medical): Not on file  Physical Activity:   . Days of Exercise per Week: Not on file  . Minutes of Exercise per Session: Not on file  Stress:   . Feeling of Stress : Not on file  Social Connections:   . Frequency of Communication with Friends and Family: Not on file  . Frequency of Social Gatherings with Friends and Family: Not on file  . Attends Religious Services: Not on file  . Active Member of Clubs or Organizations: Not on file  . Attends Archivist Meetings: Not on file  . Marital Status: Not on file  Intimate Partner Violence:   . Fear of Current or Ex-Partner: Not on file  . Emotionally Abused: Not on file  . Physically Abused: Not on file  . Sexually Abused: Not on file    Family History  Problem Relation Age of Onset  . Emphysema Mother   . COPD Mother   . Anxiety disorder Mother   . Cancer Father        lung cancer; non-smoker  . Heart disease Father 107       AMI  . COPD Sister   . Emphysema Brother   . Cancer Daughter   . Cancer Sister        lung cancer  . Heart disease Sister        AMI  . Diabetes Other   . Thyroid disease Other     ROS- All systems are reviewed and negative except as per the HPI above  Physical Exam: There were no vitals filed for this visit. Wt Readings from Last 3 Encounters:  04/30/19 91.8 kg  04/08/19 90.8 kg  03/11/19 93.5 kg    Labs: Lab Results  Component Value Date   NA 139 04/08/2019   K 4.9 04/08/2019   CL 106 04/08/2019   CO2 23 04/08/2019   GLUCOSE 103 (H) 04/08/2019   BUN 10  04/08/2019   CREATININE 1.03 (H) 04/08/2019   CALCIUM 9.1 04/08/2019   MG 2.2 10/24/2018   Lab Results  Component Value Date   INR 1.05 07/25/2012   Lab Results  Component Value Date   CHOL 169 03/14/2015   HDL 71 03/14/2015   LDLCALC 79 03/14/2015   TRIG 94 03/14/2015     GEN- The patient is well appearing, alert and oriented x 3 today.   Head- normocephalic, atraumatic Eyes-  Sclera clear, conjunctiva pink Ears- hearing intact Oropharynx- clear Neck- supple, no JVP Lymph- no cervical lymphadenopathy Lungs- Clear to ausculation bilaterally, normal work of breathing Heart-regular rate and rhythm, no murmurs, rubs or gallops, PMI not laterally displaced GI- soft, NT, ND, + BS Extremities- no clubbing, cyanosis, or edema MS- no significant deformity or atrophy Skin- no rash or lesion Psych- euthymic mood, full affect Neuro- strength and sensation are intact  EKG-Sinus rhythm at 99 bpm, with new first degree block at 256 ms, known RBBB Epic records reviewed Monitor-Patient had a min HR of 42 bpm, max HR of 147 bpm, and avg HR of 84 bpm. Predominant underlying rhythm was Atrial Flutter. First Degree AV Block was present. Atrial Flutter occurred (51% burden), ranging from 65-147 bpm (avg of 100 bpm), the longest lasting 13 hours 19 mins with an avg rate of 99 bpm. Atrial Flutter may be possible Atrial Tachycardia with variable block. 3 Pauses occurred, the longest lasting 3.9 secs (15 bpm). Atrial Flutter was detected within +/-  45 seconds of symptomatic patient event(s). Isolated SVEs were rare (<1.0%), SVE Couplets were rare (<1.0%), and SVE Triplets were rare (<1.0%). Isolated VEs were rare (<1.0%), and no VE Couplets or VE Triplets were present. Difficulty discerning atrial activity making definitive diagnosis difficult to ascertain  Assessment and Plan: 1. Afib  In SR today Has developed long first degree av block with her known RBBB Discussed with Dr. [name] and  will reduce amiodarone to 100 mg daily and stop BB States she has been feeling well Will bring back in one week for repeat EKG  She has previously not wanted to go down repeat ablation path cmet/tsh today   2.CHA2DS2VASc score is 2 Continue Xarelto 20 mg a day  F/u next week for repeat ekg  Butch Penny C. Higinio Grow, [facility] [address] [place], [place] [address] [phone]

## 2019-06-19 ENCOUNTER — Other Ambulatory Visit: Payer: Self-pay

## 2019-06-19 ENCOUNTER — Ambulatory Visit (HOSPITAL_COMMUNITY)
Admission: RE | Admit: 2019-06-19 | Discharge: 2019-06-19 | Disposition: A | Discharge status: Home / Self Care | Payer: Medicare Other | Source: Ambulatory Visit | Attending: Nurse Practitioner | Admitting: Nurse Practitioner

## 2019-06-19 DIAGNOSIS — R945 Abnormal results of liver function studies: Secondary | ICD-10-CM | POA: Diagnosis not present

## 2019-06-19 DIAGNOSIS — I440 Atrioventricular block, first degree: Secondary | ICD-10-CM | POA: Diagnosis not present

## 2019-06-19 DIAGNOSIS — I480 Paroxysmal atrial fibrillation: Secondary | ICD-10-CM

## 2019-06-19 DIAGNOSIS — R946 Abnormal results of thyroid function studies: Secondary | ICD-10-CM | POA: Diagnosis present

## 2019-06-19 DIAGNOSIS — I4510 Unspecified right bundle-branch block: Secondary | ICD-10-CM | POA: Diagnosis not present

## 2019-06-19 DIAGNOSIS — R9431 Abnormal electrocardiogram [ECG] [EKG]: Secondary | ICD-10-CM | POA: Diagnosis not present

## 2019-06-19 NOTE — Progress Notes (Signed)
Pt in for EKG after developing a long PR int of 258 ms and amio labs showing abnormal TSH and liver enzymes. The amio was stopped last week. EKG today shows SR with  first degree av block shortened to 232 ms.   F/u in one month for repeat labs and ekg.

## 2019-07-14 ENCOUNTER — Other Ambulatory Visit (HOSPITAL_COMMUNITY): Payer: Self-pay | Admitting: *Deleted

## 2019-07-14 MED ORDER — DILTIAZEM HCL ER BEADS 240 MG PO CP24: ORAL_CAPSULE | ORAL | 3 refills | Status: DC

## 2019-07-21 ENCOUNTER — Ambulatory Visit (HOSPITAL_COMMUNITY)
Admission: RE | Admit: 2019-07-21 | Discharge: 2019-07-21 | Disposition: A | Discharge status: Home / Self Care | Payer: Medicare Other | Source: Ambulatory Visit | Attending: Nurse Practitioner | Admitting: Nurse Practitioner

## 2019-07-21 ENCOUNTER — Encounter (HOSPITAL_COMMUNITY): Payer: Self-pay | Admitting: Nurse Practitioner

## 2019-07-21 ENCOUNTER — Other Ambulatory Visit: Payer: Self-pay

## 2019-07-21 VITALS — BP 126/62 | HR 55 | Ht 61.0 in | Wt 209.0 lb

## 2019-07-21 DIAGNOSIS — Z8249 Family history of ischemic heart disease and other diseases of the circulatory system: Secondary | ICD-10-CM | POA: Diagnosis not present

## 2019-07-21 DIAGNOSIS — I4891 Unspecified atrial fibrillation: Secondary | ICD-10-CM | POA: Diagnosis present

## 2019-07-21 DIAGNOSIS — E669 Obesity, unspecified: Secondary | ICD-10-CM | POA: Diagnosis not present

## 2019-07-21 DIAGNOSIS — Z79899 Other long term (current) drug therapy: Secondary | ICD-10-CM | POA: Diagnosis not present

## 2019-07-21 DIAGNOSIS — J45909 Unspecified asthma, uncomplicated: Secondary | ICD-10-CM | POA: Diagnosis not present

## 2019-07-21 DIAGNOSIS — M797 Fibromyalgia: Secondary | ICD-10-CM | POA: Diagnosis not present

## 2019-07-21 DIAGNOSIS — G4730 Sleep apnea, unspecified: Secondary | ICD-10-CM | POA: Diagnosis not present

## 2019-07-21 DIAGNOSIS — Z801 Family history of malignant neoplasm of trachea, bronchus and lung: Secondary | ICD-10-CM | POA: Insufficient documentation

## 2019-07-21 DIAGNOSIS — Z825 Family history of asthma and other chronic lower respiratory diseases: Secondary | ICD-10-CM | POA: Insufficient documentation

## 2019-07-21 DIAGNOSIS — Z6839 Body mass index (BMI) 39.0-39.9, adult: Secondary | ICD-10-CM | POA: Diagnosis not present

## 2019-07-21 DIAGNOSIS — F419 Anxiety disorder, unspecified: Secondary | ICD-10-CM | POA: Diagnosis not present

## 2019-07-21 DIAGNOSIS — Z7901 Long term (current) use of anticoagulants: Secondary | ICD-10-CM | POA: Insufficient documentation

## 2019-07-21 DIAGNOSIS — D6869 Other thrombophilia: Secondary | ICD-10-CM

## 2019-07-21 DIAGNOSIS — I480 Paroxysmal atrial fibrillation: Secondary | ICD-10-CM | POA: Diagnosis not present

## 2019-07-21 LAB — COMPREHENSIVE METABOLIC PANEL
ALT: 29 U/L (ref 0–44)
AST: 27 U/L (ref 15–41)
Albumin: 3.8 g/dL (ref 3.5–5.0)
Alkaline Phosphatase: 130 U/L — ABNORMAL HIGH (ref 38–126)
Anion gap: 11 (ref 5–15)
BUN: 21 mg/dL (ref 8–23)
CO2: 20 mmol/L — ABNORMAL LOW (ref 22–32)
Calcium: 8.6 mg/dL — ABNORMAL LOW (ref 8.9–10.3)
Chloride: 106 mmol/L (ref 98–111)
Creatinine, Ser: 1 mg/dL (ref 0.44–1.00)
GFR calc Af Amer: >60 mL/min (ref >60)
GFR calc non Af Amer: 59 mL/min — ABNORMAL LOW (ref >60)
Glucose, Bld: 93 mg/dL (ref 70–99)
Potassium: 4.1 mmol/L (ref 3.5–5.1)
Sodium: 137 mmol/L (ref 135–145)
Total Bilirubin: 0.4 mg/dL (ref 0.3–1.2)
Total Protein: 6.4 g/dL — ABNORMAL LOW (ref 6.5–8.1)

## 2019-07-21 LAB — TSH: TSH: 5.146 u[IU]/mL — ABNORMAL HIGH (ref 0.350–4.500)

## 2019-07-21 NOTE — Progress Notes (Signed)
Primary Care Physician: Horald Pollen, MD Referring Physician: Dr. [name] is a 64 y.o. female with a h/o persistent afib s/p ablation x2  that failed Tikosyn back in May, after being seen by Dr. [name] MAy 2020  and was started on amiodarone 200 mg bid to f/u in the afib clinic in  3 weeks. Pt failed to come to clinic and had to refuse her refills for her to agree to come in today. She has been taking 200 mg bid of amiodarone since May.  She last had a successful cardioversion early May.  Ekg today shows afib at 112 bpm, however, she feels that she is not in afib all the time as she has some days she reports HR's in the 60-70's range. She states that she feels well. She will lower amiodarone to one tab, 200 mg a day. She feels that she has not been taking her metoprolol as she felt her HR was in control. No missed xarelto for at least 3 weeks.  Pt is back in afib clinic, 11/18 after wearing a zio patch was found  to being out of rhythm  51% while wearing the monitor, However, she is in SR today. She is on amiodarone 200 mg daily for several weeks. Her Alk phos and TSH were elevated on last visit and will repeat  labs today .    F/u in afib clinic 06/11/19. She is in SR but has developed a long  first degree AV block and also has a known RBBB. Continues on 200 mg amiodarone daily as well as 12.5 mg metoprolol succinate bid.   Discussed with Dr. [name], She has not noted any afib. He suggested to stop BB and decrease amiodarone to 100 mg daily.CHA2DS2VASc score of 2.   F/u in afib clinic, 07/21/19. She has now been off amiodarone for 4-6 weeks as it was causing EKG changes as well as elevated TSH/liver enzymes. Now in the office, she is in Sinus brady with first degree AV block no longer noted and and IRBBB. She feels well.   Today, she denies symptoms of palpitations, chest pain, shortness of breath, orthopnea, PND, lower extremity edema, dizziness, presyncope, syncope, or  neurologic sequela. The patient is tolerating medications without difficulties and is otherwise without complaint today.   Past Medical History:  Diagnosis Date  . Anxiety   . Asthma   . Atrial fibrillation ([place]) 11/2010   paroxysmal  . Bradycardia   . Depression   . DJD (degenerative joint disease)    Cervical spine  . Fibromyalgia    s/p rheumatology consultation/[facility]  . Mild sleep apnea   . Obesity   . Osteoarthritis    Shoulders, hips,knees   Past Surgical History:  Procedure Laterality Date  . ABLATION  08/2012   PVI by Dr [name]  . ABLATION  11/13/2013   PVI by Dr [name]  . ATRIAL FIBRILLATION ABLATION N/A 09/03/2012   Procedure: ATRIAL FIBRILLATION ABLATION;  Surgeon: Thompson Grayer, MD;  Location: [facility] CATH LAB;  Service: Cardiovascular;  Laterality: N/A;  . ATRIAL FIBRILLATION ABLATION N/A 11/13/2013   Procedure: ATRIAL FIBRILLATION ABLATION;  Surgeon: Coralyn Mark, MD;  Location: [facility] CATH LAB;  Service: Cardiovascular;  Laterality: N/A;  . CARDIOVERSION  03/13/2012   Procedure: CARDIOVERSION;  Surgeon: [name], MD;  Location: [facility] ENDOSCOPY;  Service: Cardiovascular;  Laterality: N/A;  . CARDIOVERSION N/A 07/26/2012   Procedure: CARDIOVERSION;  Surgeon: Thompson Grayer, MD;  Location: [facility]  OR;  Service: Cardiovascular;  Laterality: N/A;  . CARDIOVERSION N/A 02/20/2014   Procedure: CARDIOVERSION;  Surgeon: Larey Dresser, MD;  Location: [facility];  Service: Cardiovascular;  Laterality: N/A;  . CESAREAN SECTION    . CHOLECYSTECTOMY    . HERNIA REPAIR    . KNEE SURGERY    . neck tumor resection     benign  . TEE WITHOUT CARDIOVERSION  03/13/2012   Procedure: TRANSESOPHAGEAL ECHOCARDIOGRAM (TEE);  Surgeon: [name], MD;  Location: [place] [facility];  Service: Cardiovascular;  Laterality: N/A;  Rm 2029  . TEE WITHOUT CARDIOVERSION N/A 09/02/2012   Procedure: TRANSESOPHAGEAL ECHOCARDIOGRAM (TEE);  Surgeon: Josue Hector, MD;  Location: [facility];  Service:  Cardiovascular;  Laterality: N/A;  . TEE WITHOUT CARDIOVERSION N/A 11/13/2013   Procedure: TRANSESOPHAGEAL ECHOCARDIOGRAM (TEE);  Surgeon: [name], MD;  Location: [facility] ENDOSCOPY;  Service: Cardiovascular;  Laterality: N/A;    Current Outpatient Medications  Medication Sig Dispense Refill  . acetaminophen (TYLENOL) 500 MG tablet Take by mouth as needed.     . Diclofenac-miSOPROStol 75-0.2 MG TBEC TAKE 1 TABLET BY MOUTH TWICE A DAY 60 tablet 1  . diltiazem (TIADYLT ER) 240 MG 24 hr capsule TAKE 1 CAPSULE (240 MG TOTAL) BY MOUTH DAILY. 30 capsule 3  . KLOR-CON M20 20 MEQ tablet TAKE 2 TABLETS (40 MEQ TOTAL) 2 (TWO) TIMES DAILY BY MOUTH. 360 tablet 2  . LORazepam (ATIVAN) 1 MG tablet as needed.     . pantoprazole (PROTONIX) 40 MG tablet TAKE 1 TABLET BY MOUTH TWICE A DAY 180 tablet 0  . PROVENTIL HFA 108 (90 Base) MCG/ACT inhaler INHALE 2 PUFFS INTO THE LUNGS EVERY 6 HOURS AS NEEDED 6.7 g 0  . XARELTO 20 MG TABS tablet TAKE 1 TABLET EVERY DAY WITH SUPPER 90 tablet 2   No current facility-administered medications for this encounter.    Allergies  Allergen Reactions  . Adhesive [Tape] Itching and Rash    Paper tape is better but best to use no adhesive if possible    Social History   Socioeconomic History  . Marital status: Widowed    Spouse name: Not on file  . Number of children: 3  . Years of education: Not on file  . Highest education level: Not on file  Occupational History  . Not on file  Tobacco Use  . Smoking status: Never Smoker  . Smokeless tobacco: Never Used  Substance and Sexual Activity  . Alcohol use: No    Comment: Rarely  . Drug use: No  . Sexual activity: Never  Other Topics Concern  . Not on file  Social History Narrative   Marital status: widowed since 01/2014 of melanoma; not dating.       Children:  3 children (17,30, 32) and 4 grandchildren and 1 gg. 1 adopted child.       Lives with son.  Lives in [place].        Employment:  Unemployed.        Tobacco: none      Alcohol: none      Drugs: none      Exercise: none   Social Determinants of Health   Financial Resource Strain:   . Difficulty of Paying Living Expenses: Not on file  Food Insecurity:   . Worried About Charity fundraiser in the Last Year: Not on file  . Ran Out of Food in the Last Year: Not on file  Transportation Needs:   .  Lack of Transportation (Medical): Not on file  . Lack of Transportation (Non-Medical): Not on file  Physical Activity:   . Days of Exercise per Week: Not on file  . Minutes of Exercise per Session: Not on file  Stress:   . Feeling of Stress : Not on file  Social Connections:   . Frequency of Communication with Friends and Family: Not on file  . Frequency of Social Gatherings with Friends and Family: Not on file  . Attends Religious Services: Not on file  . Active Member of Clubs or Organizations: Not on file  . Attends Archivist Meetings: Not on file  . Marital Status: Not on file  Intimate Partner Violence:   . Fear of Current or Ex-Partner: Not on file  . Emotionally Abused: Not on file  . Physically Abused: Not on file  . Sexually Abused: Not on file    Family History  Problem Relation Age of Onset  . Emphysema Mother   . COPD Mother   . Anxiety disorder Mother   . Cancer Father        lung cancer; non-smoker  . Heart disease Father 85       AMI  . COPD Sister   . Emphysema Brother   . Cancer Daughter   . Cancer Sister        lung cancer  . Heart disease Sister        AMI  . Diabetes Other   . Thyroid disease Other     ROS- All systems are reviewed and negative except as per the HPI above  Physical Exam: Vitals:   07/21/19 0932  BP: 126/62  Pulse: (!) 55  Weight: 94.8 kg  Height: 5' 1"  (1.549 m)   Wt Readings from Last 3 Encounters:  07/21/19 94.8 kg  06/11/19 93.5 kg  04/30/19 91.8 kg    Labs: Lab Results  Component Value Date   NA 140 06/11/2019   K 4.7 06/11/2019   CL 107 06/11/2019    CO2 26 06/11/2019   GLUCOSE 85 06/11/2019   BUN 16 06/11/2019   CREATININE 0.89 06/11/2019   CALCIUM 9.0 06/11/2019   MG 2.2 10/24/2018   Lab Results  Component Value Date   INR 1.05 07/25/2012   Lab Results  Component Value Date   CHOL 169 03/14/2015   HDL 71 03/14/2015   LDLCALC 79 03/14/2015   TRIG 94 03/14/2015     GEN- The patient is well appearing, alert and oriented x 3 today.   Head- normocephalic, atraumatic Eyes-  Sclera clear, conjunctiva pink Ears- hearing intact Oropharynx- clear Neck- supple, no JVP Lymph- no cervical lymphadenopathy Lungs- Clear to ausculation bilaterally, normal work of breathing Heart-regular rate and rhythm, no murmurs, rubs or gallops, PMI not laterally displaced GI- soft, NT, ND, + BS Extremities- no clubbing, cyanosis, or edema MS- no significant deformity or atrophy Skin- no rash or lesion Psych- euthymic mood, full affect Neuro- strength and sensation are intact  EKG-Sinus brady at 55 bpm, pr int 186 ms, qrs int 110 ms, qtc 453 ms Epic records reviewed   Assessment and Plan: 1. Afib  In SR today Off amiodarone x 4-6 weeks  Stopped BB States she has been feeling well BMET today for elevation of liver/thyroid results She has previously not wanted to go down repeat ablation path    2.CHA2DS2VASc score is 2 Continue Xarelto 20 mg a day  F/u 6 months   Butch Penny C. Kayleen Memos, ANP-C Afib  [facility] [address] [place], [place] [address] [phone]

## 2019-08-01 ENCOUNTER — Other Ambulatory Visit (HOSPITAL_COMMUNITY): Payer: Self-pay | Admitting: Emergency Medicine

## 2019-08-27 ENCOUNTER — Encounter: Payer: Self-pay | Admitting: Emergency Medicine

## 2019-08-27 ENCOUNTER — Ambulatory Visit (INDEPENDENT_AMBULATORY_CARE_PROVIDER_SITE_OTHER): Payer: Medicare Other | Admitting: Emergency Medicine

## 2019-08-27 ENCOUNTER — Other Ambulatory Visit: Payer: Self-pay

## 2019-08-27 ENCOUNTER — Ambulatory Visit (INDEPENDENT_AMBULATORY_CARE_PROVIDER_SITE_OTHER): Payer: Medicare Other

## 2019-08-27 VITALS — BP 112/66 | HR 70 | Temp 98.0°F | Ht 61.0 in | Wt 207.0 lb

## 2019-08-27 DIAGNOSIS — M8949 Other hypertrophic osteoarthropathy, multiple sites: Secondary | ICD-10-CM | POA: Diagnosis not present

## 2019-08-27 DIAGNOSIS — M79672 Pain in left foot: Secondary | ICD-10-CM | POA: Diagnosis not present

## 2019-08-27 DIAGNOSIS — M25562 Pain in left knee: Secondary | ICD-10-CM

## 2019-08-27 DIAGNOSIS — M159 Polyosteoarthritis, unspecified: Secondary | ICD-10-CM

## 2019-08-27 DIAGNOSIS — G8929 Other chronic pain: Secondary | ICD-10-CM | POA: Diagnosis not present

## 2019-08-27 DIAGNOSIS — M1712 Unilateral primary osteoarthritis, left knee: Secondary | ICD-10-CM

## 2019-08-27 DIAGNOSIS — M19072 Primary osteoarthritis, left ankle and foot: Secondary | ICD-10-CM

## 2019-08-27 NOTE — Progress Notes (Signed)
Angela Cummings 64 y.o.   Chief Complaint  Patient presents with  . left knee and foot swelling    1 week since great grandson threw a toy at it  . right hand  . Arthritis  . Depression    PHQ9 done. total is 5 since she lost her son 1 yr ago. okay for counsiling referral    HISTORY OF PRESENT ILLNESS: This is a 64 y.o. female complaining of left foot and left knee pain for 1 week.  Has history of arthritis with frequent bilateral hand pain. History of depression. Depression screen [facility] 2/9 08/27/2019 03/11/2019 12/25/2018 09/06/2018 10/05/2017  Decreased Interest 0 2 3 3  0  Down, Depressed, Hopeless 3 3 3 3  0  PHQ - 2 Score 3 5 6 6  0  Altered sleeping 2 3 2 3  -  Tired, decreased energy 0 2 2 3  -  Change in appetite 0 2 3 3  -  Feeling bad or failure about yourself  0 3 3 3  -  Trouble concentrating 0 1 2 3  -  Moving slowly or fidgety/restless 0 1 3 0 -  Suicidal thoughts 0 0 0 0 -  PHQ-9 Score 5 17 21 21  -  Difficult doing work/chores Somewhat difficult - Very difficult Extremely dIfficult -    HPI   Prior to Admission medications   Medication Sig Start Date End Date Taking? Authorizing Provider  acetaminophen (TYLENOL) 500 MG tablet Take by mouth as needed.    Yes [provider]  amiodarone (PACERONE) 200 MG tablet Take 200 mg by mouth daily. 08/27/19  Yes [provider]  Diclofenac-miSOPROStol 75-0.2 MG TBEC TAKE 1 TABLET BY MOUTH TWICE A DAY 02/25/19  Yes Jaelan Rasheed, Ines Bloomer, MD  diltiazem (TIADYLT ER) 240 MG 24 hr capsule TAKE 1 CAPSULE (240 MG TOTAL) BY MOUTH DAILY. 07/14/19  Yes Sherran Needs, NP  KLOR-CON M20 20 MEQ tablet TAKE 2 TABLETS (40 MEQ TOTAL) 2 (TWO) TIMES DAILY BY MOUTH. 12/04/18  Yes Sherran Needs, NP  LORazepam (ATIVAN) 1 MG tablet as needed.  01/14/19  Yes [provider]  pantoprazole (PROTONIX) 40 MG tablet TAKE 1 TABLET BY MOUTH TWICE A DAY 08/01/19  Yes Horald Pollen, MD  PROVENTIL HFA [phone] Base) MCG/ACT inhaler  INHALE 2 PUFFS INTO THE LUNGS EVERY 6 HOURS AS NEEDED 02/16/16  Yes Wardell Honour, MD  XARELTO 20 MG TABS tablet TAKE 1 TABLET EVERY DAY WITH SUPPER 11/25/18  Yes Sherran Needs, NP    Allergies  Allergen Reactions  . Adhesive [Tape] Itching and Rash    Paper tape is better but best to use no adhesive if possible    Patient Active Problem List   Diagnosis Date Noted  . Osteoarthritis of left foot 10/06/2017  . GERD (gastroesophageal reflux disease) 08/23/2017  . Fibromyalgia 03/14/2015  . Degenerative disc disease, cervical 03/14/2015  . Primary osteoarthritis involving multiple joints 03/14/2015  . Depression 02/18/2014  . Asthma   . PAF (paroxysmal atrial fibrillation) ([place]) 11/28/2010  . Obesity 11/28/2010    Past Medical History:  Diagnosis Date  . Anxiety   . Asthma   . Atrial fibrillation ([place]) 11/2010   paroxysmal  . Bradycardia   . Depression   . DJD (degenerative joint disease)    Cervical spine  . Fibromyalgia    s/p rheumatology consultation/[facility]  . Mild sleep apnea   . Obesity   . Osteoarthritis    Shoulders, hips,knees  Past Surgical History:  Procedure Laterality Date  . ABLATION  08/2012   PVI by Dr [name]  . ABLATION  11/13/2013   PVI by Dr [name]  . ATRIAL FIBRILLATION ABLATION N/A 09/03/2012   Procedure: ATRIAL FIBRILLATION ABLATION;  Surgeon: Thompson Grayer, MD;  Location: [facility] CATH LAB;  Service: Cardiovascular;  Laterality: N/A;  . ATRIAL FIBRILLATION ABLATION N/A 11/13/2013   Procedure: ATRIAL FIBRILLATION ABLATION;  Surgeon: Coralyn Mark, MD;  Location: [place] CATH LAB;  Service: Cardiovascular;  Laterality: N/A;  . CARDIOVERSION  03/13/2012   Procedure: CARDIOVERSION;  Surgeon: [name], MD;  Location: [facility] ENDOSCOPY;  Service: Cardiovascular;  Laterality: N/A;  . CARDIOVERSION N/A 07/26/2012   Procedure: CARDIOVERSION;  Surgeon: Thompson Grayer, MD;  Location: [place];  Service: Cardiovascular;  Laterality: N/A;  . CARDIOVERSION N/A 02/20/2014    Procedure: CARDIOVERSION;  Surgeon: Larey Dresser, MD;  Location: [place];  Service: Cardiovascular;  Laterality: N/A;  . CESAREAN SECTION    . CHOLECYSTECTOMY    . HERNIA REPAIR    . KNEE SURGERY    . neck tumor resection     benign  . TEE WITHOUT CARDIOVERSION  03/13/2012   Procedure: TRANSESOPHAGEAL ECHOCARDIOGRAM (TEE);  Surgeon: [name], MD;  Location: [place];  Service: Cardiovascular;  Laterality: N/A;  Rm 2029  . TEE WITHOUT CARDIOVERSION N/A 09/02/2012   Procedure: TRANSESOPHAGEAL ECHOCARDIOGRAM (TEE);  Surgeon: Josue Hector, MD;  Location: [place];  Service: Cardiovascular;  Laterality: N/A;  . TEE WITHOUT CARDIOVERSION N/A 11/13/2013   Procedure: TRANSESOPHAGEAL ECHOCARDIOGRAM (TEE);  Surgeon: [name], MD;  Location: [facility] ENDOSCOPY;  Service: Cardiovascular;  Laterality: N/A;    Social History   Socioeconomic History  . Marital status: Widowed    Spouse name: Not on file  . Number of children: 3  . Years of education: Not on file  . Highest education level: Not on file  Occupational History  . Not on file  Tobacco Use  . Smoking status: Never Smoker  . Smokeless tobacco: Never Used  Substance and Sexual Activity  . Alcohol use: No    Comment: Rarely  . Drug use: No  . Sexual activity: Never  Other Topics Concern  . Not on file  Social History Narrative   Marital status: widowed since 01/2014 of melanoma; not dating.       Children:  3 children (17,30, 32) and 4 grandchildren and 1 gg. 1 adopted child.       Lives with son.  Lives in [place].        Employment:  Unemployed.       Tobacco: none      Alcohol: none      Drugs: none      Exercise: none   Social Determinants of Health   Financial Resource Strain:   . Difficulty of Paying Living Expenses:   Food Insecurity:   . Worried About Charity fundraiser in the Last Year:   . Arboriculturist in the Last Year:   Transportation Needs:   . Film/video editor (Medical):     Marland Kitchen Lack of Transportation (Non-Medical):   Physical Activity:   . Days of Exercise per Week:   . Minutes of Exercise per Session:   Stress:   . Feeling of Stress :   Social Connections:   . Frequency of Communication with Friends and Family:   . Frequency of Social Gatherings with Friends and Family:   .  Attends Religious Services:   . Active Member of Clubs or Organizations:   . Attends Archivist Meetings:   Marland Kitchen Marital Status:   Intimate Partner Violence:   . Fear of Current or Ex-Partner:   . Emotionally Abused:   Marland Kitchen Physically Abused:   . Sexually Abused:     Family History  Problem Relation Age of Onset  . Emphysema Mother   . COPD Mother   . Anxiety disorder Mother   . Cancer Father        lung cancer; non-smoker  . Heart disease Father 34       AMI  . COPD Sister   . Emphysema Brother   . Cancer Daughter   . Cancer Sister        lung cancer  . Heart disease Sister        AMI  . Diabetes Other   . Thyroid disease Other      Review of Systems  Constitutional: Negative.  Negative for chills and fever.  HENT: Negative for congestion and sore throat.   Respiratory: Negative.  Negative for cough and shortness of breath.   Cardiovascular: Negative.  Negative for chest pain and palpitations.  Gastrointestinal: Negative.  Negative for abdominal pain, diarrhea, nausea and vomiting.  Musculoskeletal: Negative.  Negative for myalgias and neck pain.  Skin: Negative.  Negative for rash.  Neurological: Negative for dizziness and headaches.  All other systems reviewed and are negative.  Vitals:   08/27/19 0947  BP: 112/66  Pulse: 70  Temp: 98 F (36.7 C)  SpO2: 99%     Physical Exam Vitals reviewed.  Constitutional:      Appearance: Normal appearance.  HENT:     Head: Normocephalic.  Eyes:     Extraocular Movements: Extraocular movements intact.     Pupils: Pupils are equal, round, and reactive to light.  Cardiovascular:     Rate and Rhythm:  Normal rate.  Pulmonary:     Effort: Pulmonary effort is normal.  Musculoskeletal:     Cervical back: Normal range of motion.     Comments: Left lower extremity: Left knee: No erythema or ecchymosis.  No significant swelling or tenderness.  Full range of motion but complaining of pain. Left foot: Warm to touch.  No erythema or ecchymosis.  Good distal peripheral pulses with good capillary refill.  Neurovascularly intact.  No swelling or significant tenderness.  Full range of motion but complaining of pain.  Skin:    General: Skin is warm and dry.     Capillary Refill: Capillary refill takes less than 2 seconds.  Neurological:     General: No focal deficit present.     Mental Status: She is alert and oriented to person, place, and time.  Psychiatric:        Mood and Affect: Mood normal.        Behavior: Behavior normal.     DG Knee Complete 4 Views Left  Result Date: 08/27/2019 CLINICAL DATA:  Chronic left knee pain. EXAM: LEFT KNEE - COMPLETE 4+ VIEW COMPARISON:  None. FINDINGS: No fracture or dislocation. There is moderately severe arthritis of the patellofemoral compartment. Medial and lateral joint spaces are preserved. Old deformity of the proximal fibular shaft consistent with a remote healed fracture. No appreciable joint effusion. IMPRESSION: Moderately severe arthritis of the patellofemoral compartment of the left knee. Electronically Signed   By: Lorriane Shire M.D.   On: 08/27/2019 10:49   DG Foot Complete Left  Result Date: 08/27/2019 CLINICAL DATA:  Left foot pain. EXAM: LEFT FOOT - COMPLETE 3+ VIEW COMPARISON:  10/05/2017 FINDINGS: There is moderate arthritis at the second and third tarsal metatarsal joints and between the navicular and the medial cuneiform. Slight bunion formation on the head of the first metatarsal with minimal arthritis of the first MTP joint. Slight arthritic changes at the ankle joint. Chronic calcaneal enthesophytes. Dorsal spurring in the midfoot. There  has been slight progression of the arthritic changes in the midfoot since the prior study. Extensive arterial calcifications at the ankle and foot. No other abnormalities of significance. IMPRESSION: Slight progression of arthritic changes in the midfoot since the prior study. No other significant change. Electronically Signed   By: Lorriane Shire M.D.   On: 08/27/2019 10:54    ASSESSMENT & PLAN: Devon was seen today for left knee and foot swelling, right hand, arthritis and depression.  Diagnoses and all orders for this visit:  Chronic pain of left knee -     DG Knee Complete 4 Views Left -     Ambulatory referral to Orthopedic Surgery  Left foot pain -     DG Foot Complete Left  Primary osteoarthritis involving multiple joints  Osteoarthritis of left foot, unspecified osteoarthritis type  Primary osteoarthritis of left knee -     Ambulatory referral to Orthopedic Surgery    Patient Instructions       If you have lab work done today you will be contacted with your lab results within the next 2 weeks.  If you have not heard from [place] then please contact us. The fastest way to get your results is to register for My Chart.   IF you received an x-ray today, you will receive an invoice from [facility]. Please contact [facility] at [phone] with questions or concerns regarding your invoice.   IF you received labwork today, you will receive an invoice from [facility]. Please contact [facility] at [phone] with questions or concerns regarding your invoice.   Our billing staff will not be able to assist you with questions regarding bills from these companies.  You will be contacted with the lab results as soon as they are available. The fastest way to get your results is to activate your My Chart account. Instructions are located on the last page of this paperwork. If you have not heard from [place] regarding the results in 2 weeks, please contact this office.       Chronic Knee Pain, Adult Knee pain that lasts longer than 3 months is called chronic knee pain. You may have pain in one or both knees. Symptoms of chronic knee pain may also include swelling and stiffness. The most common cause is age-related wear and tear (osteoarthritis) of your knee joint. Many conditions can cause chronic knee pain. Treatment depends on the cause. The main treatments are physical therapy and weight loss. It may also be treated with medicines, injections, a knee sleeve or brace, and by using crutches. Rest, ice, compression (pressure), and elevation (RICE) therapy may also be recommended. Follow these instructions at home: If you have a knee sleeve or brace:   Wear it as told by your doctor. Remove it only as told by your doctor.  Loosen it if your toes: ? Tingle. ? Become numb. ? Turn cold and blue.  Keep it clean.  If the sleeve or brace is not waterproof: ? Do not let it get wet. ? Remove it if told by your doctor,  or cover it with a watertight covering when you take a bath or shower. Managing pain, stiffness, and swelling      If told, put heat on your knee. Do this as often as told by your doctor. Use the heat source that your doctor recommends, such as a moist heat pack or a heating pad. ? If you have a removable sleeve or brace, remove it as told by your doctor. ? Place a towel between your skin and the heat source. ? Leave the heat on for 20-30 minutes. ? Remove the heat if your skin turns bright red. This is very important if you are unable to feel pain, heat, or cold. You may have a greater risk of getting burned.  If told, put ice on your knee. ? If you have a removable sleeve or brace, remove it as told by your doctor. ? Put ice in a plastic bag. ? Place a towel between your skin and the bag. ? Leave the ice on for 20 minutes, 2-3 times a day.  Move your toes often.  Raise (elevate) the injured area above the level of your heart while you  are sitting or lying down. Activity  Avoid activities where both feet leave the ground at the same time (high-impact activities). Examples are running, jumping rope, and doing jumping jacks.  Return to your normal activities as told by your doctor. Ask your doctor what activities are safe for you.  Follow the exercise plan that your doctor makes for you. Your doctor may suggest that you: ? Avoid activities that make knee pain worse. You may need to change the exercises that you do, the sports that you participate in, or your job duties. ? Wear shoes with cushioned soles. ? Avoid high-impact activities or sports that require running and sudden changes in direction. ? Do exercises or physical therapy as told by your doctor. Physical therapy is planned to match your needs and abilities. ? Do exercises that increase your balance and strength, such as tai chi and yoga.  Do not use your injured knee to support your body weight until your doctor says that you can. Use crutches, a cane, or a walker, as told by your doctor. General instructions  Take over-the-counter and prescription medicines only as told by your doctor.  If you are overweight, work with your doctor and a food expert (dietitian) to set goals to lose weight. Being overweight can make your knee hurt more.  Do not use any products that contain nicotine or tobacco, such as cigarettes, e-cigarettes, and chewing tobacco. If you need help quitting, ask your doctor.  Keep all follow-up visits as told by your doctor. This is important. Contact a doctor if:  You have knee pain that is not getting better or gets worse.  You are not able to do your exercises due to knee pain. Get help right away if:  Your knee swells and the swelling becomes worse.  You cannot move your knee.  You have very bad knee pain. Summary  Knee pain that lasts more than 3 months is considered chronic knee pain.  The main treatments for chronic knee pain  are physical therapy and weight loss. You may also need to take medicines, wear a knee sleeve or brace, use crutches, and put ice or heat on your knee.  Lose weight if you are overweight. Work with your doctor and a food expert (dietitian) to help you set goals to lose weight. Being overweight can make  your knee hurt more.  Work with a physical therapist to make a safe exercise program, as told by your doctor. This information is not intended to replace advice given to you by your health care provider. Make sure you discuss any questions you have with your health care provider. Document Revised: 08/08/2018 Document Reviewed: 08/08/2018 Elsevier Patient Education  2020 Elsevier Inc.      Agustina Caroli, MD [facility]

## 2019-08-27 NOTE — Patient Instructions (Addendum)
If you have lab work done today you will be contacted with your lab results within the next 2 weeks.  If you have not heard from [place] then please contact us. The fastest way to get your results is to register for My Chart.   IF you received an x-ray today, you will receive an invoice from [facility]. Please contact [facility] at [phone] with questions or concerns regarding your invoice.   IF you received labwork today, you will receive an invoice from [place]. Please contact [facility] at [phone] with questions or concerns regarding your invoice.   Our billing staff will not be able to assist you with questions regarding bills from these companies.  You will be contacted with the lab results as soon as they are available. The fastest way to get your results is to activate your My Chart account. Instructions are located on the last page of this paperwork. If you have not heard from [place] regarding the results in 2 weeks, please contact this office.      Chronic Knee Pain, Adult Knee pain that lasts longer than 3 months is called chronic knee pain. You may have pain in one or both knees. Symptoms of chronic knee pain may also include swelling and stiffness. The most common cause is age-related wear and tear (osteoarthritis) of your knee joint. Many conditions can cause chronic knee pain. Treatment depends on the cause. The main treatments are physical therapy and weight loss. It may also be treated with medicines, injections, a knee sleeve or brace, and by using crutches. Rest, ice, compression (pressure), and elevation (RICE) therapy may also be recommended. Follow these instructions at home: If you have a knee sleeve or brace:   Wear it as told by your doctor. Remove it only as told by your doctor.  Loosen it if your toes: ? Tingle. ? Become numb. ? Turn cold and blue.  Keep it clean.  If the sleeve or brace is not waterproof: ? Do not let it get  wet. ? Remove it if told by your doctor, or cover it with a watertight covering when you take a bath or shower. Managing pain, stiffness, and swelling      If told, put heat on your knee. Do this as often as told by your doctor. Use the heat source that your doctor recommends, such as a moist heat pack or a heating pad. ? If you have a removable sleeve or brace, remove it as told by your doctor. ? Place a towel between your skin and the heat source. ? Leave the heat on for 20-30 minutes. ? Remove the heat if your skin turns bright red. This is very important if you are unable to feel pain, heat, or cold. You may have a greater risk of getting burned.  If told, put ice on your knee. ? If you have a removable sleeve or brace, remove it as told by your doctor. ? Put ice in a plastic bag. ? Place a towel between your skin and the bag. ? Leave the ice on for 20 minutes, 2-3 times a day.  Move your toes often.  Raise (elevate) the injured area above the level of your heart while you are sitting or lying down. Activity  Avoid activities where both feet leave the ground at the same time (high-impact activities). Examples are running, jumping rope, and doing jumping jacks.  Return to your normal activities as told by your doctor. Ask your  doctor what activities are safe for you.  Follow the exercise plan that your doctor makes for you. Your doctor may suggest that you: ? Avoid activities that make knee pain worse. You may need to change the exercises that you do, the sports that you participate in, or your job duties. ? Wear shoes with cushioned soles. ? Avoid high-impact activities or sports that require running and sudden changes in direction. ? Do exercises or physical therapy as told by your doctor. Physical therapy is planned to match your needs and abilities. ? Do exercises that increase your balance and strength, such as tai chi and yoga.  Do not use your injured knee to support your  body weight until your doctor says that you can. Use crutches, a cane, or a walker, as told by your doctor. General instructions  Take over-the-counter and prescription medicines only as told by your doctor.  If you are overweight, work with your doctor and a food expert (dietitian) to set goals to lose weight. Being overweight can make your knee hurt more.  Do not use any products that contain nicotine or tobacco, such as cigarettes, e-cigarettes, and chewing tobacco. If you need help quitting, ask your doctor.  Keep all follow-up visits as told by your doctor. This is important. Contact a doctor if:  You have knee pain that is not getting better or gets worse.  You are not able to do your exercises due to knee pain. Get help right away if:  Your knee swells and the swelling becomes worse.  You cannot move your knee.  You have very bad knee pain. Summary  Knee pain that lasts more than 3 months is considered chronic knee pain.  The main treatments for chronic knee pain are physical therapy and weight loss. You may also need to take medicines, wear a knee sleeve or brace, use crutches, and put ice or heat on your knee.  Lose weight if you are overweight. Work with your doctor and a food expert (dietitian) to help you set goals to lose weight. Being overweight can make your knee hurt more.  Work with a physical therapist to make a safe exercise program, as told by your doctor. This information is not intended to replace advice given to you by your health care provider. Make sure you discuss any questions you have with your health care provider. Document Revised: 08/08/2018 Document Reviewed: 08/08/2018 Elsevier Patient Education  [place].

## 2019-09-03 ENCOUNTER — Ambulatory Visit: Payer: Medicare Other | Admitting: Orthopedic Surgery

## 2019-09-03 ENCOUNTER — Other Ambulatory Visit: Payer: Self-pay

## 2019-09-03 ENCOUNTER — Ambulatory Visit: Payer: Self-pay

## 2019-09-03 VITALS — Ht 61.0 in | Wt 209.0 lb

## 2019-09-03 DIAGNOSIS — M545 Low back pain: Secondary | ICD-10-CM | POA: Diagnosis not present

## 2019-09-03 DIAGNOSIS — M79605 Pain in left leg: Secondary | ICD-10-CM

## 2019-09-03 DIAGNOSIS — M1712 Unilateral primary osteoarthritis, left knee: Secondary | ICD-10-CM

## 2019-09-04 ENCOUNTER — Encounter: Payer: Self-pay | Admitting: Orthopedic Surgery

## 2019-09-04 NOTE — Progress Notes (Signed)
Office Visit Note   Patient: Angela Cummings           Date of Birth: 28-Jul-1955           MRN: [id number] Visit Date: 09/03/2019 Requested by: Horald Pollen, MD [place],  [place] [address] PCP: Horald Pollen, MD  Subjective: Chief Complaint  Patient presents with  . Right Knee - Pain  . Left Knee - Pain    HPI: Angela Cummings is a patient with bilateral knee pain left worse than right.  Denies any history of injury.  Is had chronic pain for 15 years.  No prior surgery.  Reports some popping but also some radicular left-sided pain which runs down the leg.  Reports buttock pain but no discrete low back pain.  Outside radiographs of the left knee demonstrate moderately severe patellofemoral arthritis. Patient does care for her grandchildren.  Reports some pain at night.  Describes some "popping".   Also reports left foot pain of several weeks duration with no history of injury.  This has improved some.  Voltaren helps her foot and knee.  Left foot radiographs demonstrate tarsometatarsal degenerative changes.              ROS: All systems reviewed are negative as they relate to the chief complaint within the history of present illness.  Patient denies  fevers or chills.   Assessment & Plan: Visit Diagnoses:  1. Pain in left leg     Plan: Impression is bilateral knee pain left worse than right with some patellofemoral arthritis present.  Mild effusion is present.  Aspiration injection of the left knee performed today.  6-week return decision for or against MRI scanning at that time.  Regarding the foot she does have some mild midfoot degenerative changes.  I think wearing shoes that are relatively stiff soled with good arches would help that.  We will see her back in 6 weeks for clinical recheck.  Follow-Up Instructions: No follow-ups on file.   Orders:  Orders Placed This Encounter  Procedures  . XR Lumbar Spine 2-3 Views   No orders of the defined types were  placed in this encounter.     Procedures: Large Joint Inj: L knee on 09/05/2019 12:01 AM Indications: diagnostic evaluation, joint swelling and pain Details: 18 G 1.5 in needle, superolateral approach  Arthrogram: No  Medications: 5 mL lidocaine 1 %; 40 mg methylPREDNISolone acetate 40 MG/ML; 4 mL bupivacaine 0.25 % Outcome: tolerated well, no immediate complications Procedure, treatment alternatives, risks and benefits explained, specific risks discussed. Consent was given by the patient. Immediately prior to procedure a time out was called to verify the correct patient, procedure, equipment, support staff and site/side marked as required. Patient was prepped and draped in the usual sterile fashion.       Clinical Data: No additional findings.  Objective: Vital Signs: Ht 5\' 1"  (1.549 m)   Wt 209 lb (94.8 kg)   BMI 39.49 kg/m   Physical Exam:   Constitutional: Patient appears well-developed HEENT:  Head: Normocephalic Eyes:EOM are normal Neck: Normal range of motion Cardiovascular: Normal rate Pulmonary/chest: Effort normal Neurologic: Patient is alert Skin: Skin is warm Psychiatric: Patient has normal mood and affect    Ortho Exam: Ortho exam demonstrates palpable pedal pulses bilaterally with palpable intact nontender anterior tib posterior to peroneal Achilles tendons.  Patient has pretty reasonable ankle dorsiflexion plantarflexion inversion eversion strength.  Transverse tarsal motion slightly tender on the left not on  the right.  Osteophyte formation is noted on the left-hand side around the midfoot region which is not present on the right.  Left knee and right knee are examined.  She does have left knee effusion with mild patellofemoral crepitus.  Collateral crucial ligaments are stable on the left-hand side.  No masses lymphadenopathy or skin changes noted in that left knee region.  Extensor mechanism is intact.  No flexion contracture.  Specialty Comments:  No  specialty comments available.  Imaging: No results found.   PMFS History: Patient Active Problem List   Diagnosis Date Noted  . Osteoarthritis of left foot 10/06/2017  . GERD (gastroesophageal reflux disease) 08/23/2017  . Fibromyalgia 03/14/2015  . Degenerative disc disease, cervical 03/14/2015  . Primary osteoarthritis involving multiple joints 03/14/2015  . Depression 02/18/2014  . Asthma   . PAF (paroxysmal atrial fibrillation) ([place]) 11/28/2010  . Obesity 11/28/2010   Past Medical History:  Diagnosis Date  . Anxiety   . Asthma   . Atrial fibrillation ([place]) 11/2010   paroxysmal  . Bradycardia   . Depression   . DJD (degenerative joint disease)    Cervical spine  . Fibromyalgia    s/p rheumatology consultation/Zeminsky  . Mild sleep apnea   . Obesity   . Osteoarthritis    Shoulders, hips,knees    Family History  Problem Relation Age of Onset  . Emphysema Mother   . COPD Mother   . Anxiety disorder Mother   . Cancer Father        lung cancer; non-smoker  . Heart disease Father 62       AMI  . COPD Sister   . Emphysema Brother   . Cancer Daughter   . Cancer Sister        lung cancer  . Heart disease Sister        AMI  . Diabetes Other   . Thyroid disease Other     Past Surgical History:  Procedure Laterality Date  . ABLATION  08/2012   PVI by Dr [name]  . ABLATION  11/13/2013   PVI by Dr [name]  . ATRIAL FIBRILLATION ABLATION N/A 09/03/2012   Procedure: ATRIAL FIBRILLATION ABLATION;  Surgeon: Thompson Grayer, MD;  Location: [facility] CATH LAB;  Service: Cardiovascular;  Laterality: N/A;  . ATRIAL FIBRILLATION ABLATION N/A 11/13/2013   Procedure: ATRIAL FIBRILLATION ABLATION;  Surgeon: Angela Mark, MD;  Location: [facility] CATH LAB;  Service: Cardiovascular;  Laterality: N/A;  . CARDIOVERSION  03/13/2012   Procedure: CARDIOVERSION;  Surgeon: [name], MD;  Location: [facility] ENDOSCOPY;  Service: Cardiovascular;  Laterality: N/A;  . CARDIOVERSION N/A 07/26/2012    Procedure: CARDIOVERSION;  Surgeon: Thompson Grayer, MD;  Location: [place];  Service: Cardiovascular;  Laterality: N/A;  . CARDIOVERSION N/A 02/20/2014   Procedure: CARDIOVERSION;  Surgeon: Larey Dresser, MD;  Location: [place];  Service: Cardiovascular;  Laterality: N/A;  . CESAREAN SECTION    . CHOLECYSTECTOMY    . HERNIA REPAIR    . KNEE SURGERY    . neck tumor resection     benign  . TEE WITHOUT CARDIOVERSION  03/13/2012   Procedure: TRANSESOPHAGEAL ECHOCARDIOGRAM (TEE);  Surgeon: [name], MD;  Location: [place];  Service: Cardiovascular;  Laterality: N/A;  Rm 2029  . TEE WITHOUT CARDIOVERSION N/A 09/02/2012   Procedure: TRANSESOPHAGEAL ECHOCARDIOGRAM (TEE);  Surgeon: Josue Hector, MD;  Location: [place];  Service: Cardiovascular;  Laterality: N/A;  . TEE WITHOUT CARDIOVERSION N/A 11/13/2013   Procedure:  TRANSESOPHAGEAL ECHOCARDIOGRAM (TEE);  Surgeon: [name], MD;  Location: [place];  Service: Cardiovascular;  Laterality: N/A;   Social History   Occupational History  . Not on file  Tobacco Use  . Smoking status: Never Smoker  . Smokeless tobacco: Never Used  Substance and Sexual Activity  . Alcohol use: No    Comment: Rarely  . Drug use: No  . Sexual activity: Never

## 2019-09-05 DIAGNOSIS — M1712 Unilateral primary osteoarthritis, left knee: Secondary | ICD-10-CM

## 2019-09-05 MED ORDER — LIDOCAINE HCL 1 % IJ SOLN
5.0000 mL | INTRAMUSCULAR | Status: AC | PRN
  Administered 2019-09-05: 5 mL

## 2019-09-05 MED ORDER — METHYLPREDNISOLONE ACETATE 40 MG/ML IJ SUSP
40.0000 mg | INTRAMUSCULAR | Status: AC | PRN
  Administered 2019-09-05: 40 mg via INTRA_ARTICULAR

## 2019-09-05 MED ORDER — BUPIVACAINE HCL 0.25 % IJ SOLN
4.0000 mL | INTRAMUSCULAR | Status: AC | PRN
  Administered 2019-09-05: 4 mL via INTRA_ARTICULAR

## 2019-09-08 ENCOUNTER — Ambulatory Visit: Payer: Medicare Other | Admitting: Emergency Medicine

## 2019-10-10 ENCOUNTER — Other Ambulatory Visit (HOSPITAL_COMMUNITY): Payer: Self-pay | Admitting: Nurse Practitioner

## 2019-10-15 ENCOUNTER — Ambulatory Visit: Payer: Medicare Other | Admitting: Orthopedic Surgery

## 2019-10-27 ENCOUNTER — Other Ambulatory Visit (HOSPITAL_COMMUNITY): Payer: Self-pay | Admitting: Nurse Practitioner

## 2019-10-27 ENCOUNTER — Other Ambulatory Visit (HOSPITAL_COMMUNITY): Payer: Self-pay

## 2019-11-15 ENCOUNTER — Other Ambulatory Visit (HOSPITAL_COMMUNITY): Payer: Self-pay | Admitting: Nurse Practitioner

## 2019-11-22 ENCOUNTER — Other Ambulatory Visit (HOSPITAL_COMMUNITY): Payer: Self-pay | Admitting: Nurse Practitioner

## 2020-01-20 ENCOUNTER — Ambulatory Visit (HOSPITAL_COMMUNITY)
Admission: RE | Admit: 2020-01-20 | Discharge: 2020-01-20 | Disposition: A | Discharge status: Home / Self Care | Payer: Medicare Other | Source: Ambulatory Visit | Attending: Nurse Practitioner | Admitting: Nurse Practitioner

## 2020-01-20 ENCOUNTER — Encounter (HOSPITAL_COMMUNITY): Payer: Self-pay | Admitting: Nurse Practitioner

## 2020-01-20 ENCOUNTER — Other Ambulatory Visit: Payer: Self-pay

## 2020-01-20 VITALS — BP 112/84 | HR 132 | Ht 61.0 in | Wt 212.0 lb

## 2020-01-20 DIAGNOSIS — Z888 Allergy status to other drugs, medicaments and biological substances status: Secondary | ICD-10-CM | POA: Diagnosis not present

## 2020-01-20 DIAGNOSIS — Z8249 Family history of ischemic heart disease and other diseases of the circulatory system: Secondary | ICD-10-CM | POA: Insufficient documentation

## 2020-01-20 DIAGNOSIS — Z9049 Acquired absence of other specified parts of digestive tract: Secondary | ICD-10-CM | POA: Diagnosis not present

## 2020-01-20 DIAGNOSIS — Z9889 Other specified postprocedural states: Secondary | ICD-10-CM | POA: Diagnosis not present

## 2020-01-20 DIAGNOSIS — J45909 Unspecified asthma, uncomplicated: Secondary | ICD-10-CM | POA: Insufficient documentation

## 2020-01-20 DIAGNOSIS — F419 Anxiety disorder, unspecified: Secondary | ICD-10-CM | POA: Diagnosis not present

## 2020-01-20 DIAGNOSIS — Z7901 Long term (current) use of anticoagulants: Secondary | ICD-10-CM | POA: Insufficient documentation

## 2020-01-20 DIAGNOSIS — I440 Atrioventricular block, first degree: Secondary | ICD-10-CM | POA: Diagnosis not present

## 2020-01-20 DIAGNOSIS — M47812 Spondylosis without myelopathy or radiculopathy, cervical region: Secondary | ICD-10-CM | POA: Diagnosis not present

## 2020-01-20 DIAGNOSIS — I480 Paroxysmal atrial fibrillation: Secondary | ICD-10-CM | POA: Diagnosis not present

## 2020-01-20 DIAGNOSIS — D6869 Other thrombophilia: Secondary | ICD-10-CM | POA: Diagnosis not present

## 2020-01-20 DIAGNOSIS — M797 Fibromyalgia: Secondary | ICD-10-CM | POA: Diagnosis not present

## 2020-01-20 DIAGNOSIS — E669 Obesity, unspecified: Secondary | ICD-10-CM | POA: Insufficient documentation

## 2020-01-20 DIAGNOSIS — Z79899 Other long term (current) drug therapy: Secondary | ICD-10-CM | POA: Insufficient documentation

## 2020-01-20 DIAGNOSIS — I4510 Unspecified right bundle-branch block: Secondary | ICD-10-CM | POA: Diagnosis not present

## 2020-01-20 DIAGNOSIS — I4819 Other persistent atrial fibrillation: Secondary | ICD-10-CM | POA: Insufficient documentation

## 2020-01-20 DIAGNOSIS — F329 Major depressive disorder, single episode, unspecified: Secondary | ICD-10-CM | POA: Diagnosis not present

## 2020-01-20 DIAGNOSIS — M159 Polyosteoarthritis, unspecified: Secondary | ICD-10-CM | POA: Diagnosis not present

## 2020-01-20 MED ORDER — METOPROLOL SUCCINATE ER 25 MG PO TB24: 12.5000 mg | ORAL_TABLET | Freq: Every day | ORAL | 6 refills | Status: DC

## 2020-01-20 NOTE — Patient Instructions (Signed)
Start Metoprolol Succinate- take 12.5mg  by mouth daily

## 2020-01-20 NOTE — Progress Notes (Signed)
Primary Care Physician: Horald Pollen, MD Referring Physician: Dr. [name] is a 64 y.o. female with a h/o persistent afib s/p ablation x2  that failed Tikosyn back in May, after being seen by Dr. [name] MAy 2020  and was started on amiodarone 200 mg bid to f/u in the afib clinic in  3 weeks. Pt failed to come to clinic and had to refuse her refills for her to agree to come in today. She has been taking 200 mg bid of amiodarone since May.  She last had a successful cardioversion early May.  Ekg today shows afib at 112 bpm, however, she feels that she is not in afib all the time as she has some days she reports HR's in the 60-70's range. She states that she feels well. She will lower amiodarone to one tab, 200 mg a day. She feels that she has not been taking her metoprolol as she felt her HR was in control. No missed xarelto for at least 3 weeks.  Pt is back in afib clinic, 11/18 after wearing a zio patch was found  to being out of rhythm  51% while wearing the monitor, However, she is in SR today. She is on amiodarone 200 mg daily for several weeks. Her Alk phos and TSH were elevated on last visit and will repeat  labs today .    F/u in afib clinic 06/11/19. She is in SR but has developed a long  first degree AV block and also has a known RBBB. Continues on 200 mg amiodarone daily as well as 12.5 mg metoprolol succinate bid.   Discussed with Dr. [name], She has not noted any afib. He suggested to stop BB and decrease amiodarone to 100 mg daily.CHA2DS2VASc score of 2.   F/u in afib clinic, 07/21/19. She has now been off amiodarone for 4-6 weeks as it was causing EKG changes as well as elevated TSH/liver enzymes. Now in the office, she is in Sinus brady with first degree AV block no longer noted and and IRBBB. She feels well.   F/u in afib clinic, 8/10. She  has noted more palpitations. She is in SR with PAC's. Will restart low dose  BB. Continues  on xarelto 20 mg daily for a  CHA2DS2VASc score of 2.   Today, she denies symptoms of palpitations, chest pain, shortness of breath, orthopnea, PND, lower extremity edema, dizziness, presyncope, syncope, or neurologic sequela. The patient is tolerating medications without difficulties and is otherwise without complaint today.   Past Medical History:  Diagnosis Date  . Anxiety   . Asthma   . Atrial fibrillation ([place]) 11/2010   paroxysmal  . Bradycardia   . Depression   . DJD (degenerative joint disease)    Cervical spine  . Fibromyalgia    s/p rheumatology consultation/Zeminsky  . Mild sleep apnea   . Obesity   . Osteoarthritis    Shoulders, hips,knees   Past Surgical History:  Procedure Laterality Date  . ABLATION  08/2012   PVI by Dr [name]  . ABLATION  11/13/2013   PVI by Dr [name]  . ATRIAL FIBRILLATION ABLATION N/A 09/03/2012   Procedure: ATRIAL FIBRILLATION ABLATION;  Surgeon: Thompson Grayer, MD;  [facility] C[facility] LAB;  Service: Cardiovascular;  Laterality: N/A;  . ATRIAL FIBRILLATION ABLATION N/A 11/13/2013   Procedure: ATRIAL FIBRILLATION ABLATION;  Surgeon: Coralyn Mark, MD;  Location: [place] CATH LAB;  Service: Cardiovascular;  Laterality: N/A;  . CARDIOVERSION  03/13/2012   Procedure: CARDIOVERSION;  Surgeon: [name], MD;  Location: [facility] ENDOSCOPY;  Service: Cardiovascular;  Laterality: N/A;  . CARDIOVERSION N/A 07/26/2012   Procedure: CARDIOVERSION;  Surgeon: Thompson Grayer, MD;  Location: [place];  Service: Cardiovascular;  Laterality: N/A;  . CARDIOVERSION N/A 02/20/2014   Procedure: CARDIOVERSION;  Surgeon: Larey Dresser, MD;  Location: [place];  Service: Cardiovascular;  Laterality: N/A;  . CESAREAN SECTION    . CHOLECYSTECTOMY    . HERNIA REPAIR    . KNEE SURGERY    . neck tumor resection     benign  . TEE WITHOUT CARDIOVERSION  03/13/2012   Procedure: TRANSESOPHAGEAL ECHOCARDIOGRAM (TEE);  Surgeon: [name], MD;  Location: [place];  Service: Cardiovascular;  Laterality: N/A;  Rm  2029  . TEE WITHOUT CARDIOVERSION N/A 09/02/2012   Procedure: TRANSESOPHAGEAL ECHOCARDIOGRAM (TEE);  Surgeon: Josue Hector, MD;  Location: [place];  Service: Cardiovascular;  Laterality: N/A;  . TEE WITHOUT CARDIOVERSION N/A 11/13/2013   Procedure: TRANSESOPHAGEAL ECHOCARDIOGRAM (TEE);  Surgeon: [name], MD;  Location: [facility] ENDOSCOPY;  Service: Cardiovascular;  Laterality: N/A;    Current Outpatient Medications  Medication Sig Dispense Refill  . acetaminophen (TYLENOL) 500 MG tablet Take by mouth as needed.     . Diclofenac-miSOPROStol 75-0.2 MG TBEC TAKE 1 TABLET BY MOUTH TWICE A DAY 60 tablet 1  . KLOR-CON M20 20 MEQ tablet TAKE 2 TABLETS (40 MEQ TOTAL) 2 (TWO) TIMES DAILY BY MOUTH. 120 tablet 8  . LORazepam (ATIVAN) 1 MG tablet as needed.     . pantoprazole (PROTONIX) 40 MG tablet TAKE 1 TABLET BY MOUTH TWICE A DAY 180 tablet 0  . PROVENTIL HFA 108 (90 Base) MCG/ACT inhaler INHALE 2 PUFFS INTO THE LUNGS EVERY 6 HOURS AS NEEDED 6.7 g 0  . TIADYLT ER 240 MG 24 hr capsule TAKE 1 CAPSULE BY MOUTH EVERY DAY 90 capsule 1  . XARELTO 20 MG TABS tablet TAKE 1 TABLET EVERY DAY WITH SUPPER 90 tablet 2   No current facility-administered medications for this encounter.    Allergies  Allergen Reactions  . Adhesive [Tape] Itching and Rash    Paper tape is better but best to use no adhesive if possible    Social History   Socioeconomic History  . Marital status: Widowed    Spouse name: Not on file  . Number of children: 3  . Years of education: Not on file  . Highest education level: Not on file  Occupational History  . Not on file  Tobacco Use  . Smoking status: Never Smoker  . Smokeless tobacco: Never Used  Substance and Sexual Activity  . Alcohol use: Yes    Comment: Rarely- once every few months  . Drug use: No  . Sexual activity: Never  Other Topics Concern  . Not on file  Social History Narrative   Marital status: widowed since 01/2014 of melanoma; not dating.        Children:  3 children (17,30, 32) and 4 grandchildren and 1 gg. 1 adopted child.       Lives with son.  Lives in [place].        Employment:  Unemployed.       Tobacco: none      Alcohol: none      Drugs: none      Exercise: none   Social Determinants of Health   Financial Resource Strain:   . Difficulty of Paying Living Expenses:  Food Insecurity:   . Worried About Charity fundraiser in the Last Year:   . Arboriculturist in the Last Year:   Transportation Needs:   . Film/video editor (Medical):   Marland Kitchen Lack of Transportation (Non-Medical):   Physical Activity:   . Days of Exercise per Week:   . Minutes of Exercise per Session:   Stress:   . Feeling of Stress :   Social Connections:   . Frequency of Communication with Friends and Family:   . Frequency of Social Gatherings with Friends and Family:   . Attends Religious Services:   . Active Member of Clubs or Organizations:   . Attends Archivist Meetings:   Marland Kitchen Marital Status:   Intimate Partner Violence:   . Fear of Current or Ex-Partner:   . Emotionally Abused:   Marland Kitchen Physically Abused:   . Sexually Abused:     Family History  Problem Relation Age of Onset  . Emphysema Mother   . COPD Mother   . Anxiety disorder Mother   . Cancer Father        lung cancer; non-smoker  . Heart disease Father 30       AMI  . COPD Sister   . Emphysema Brother   . Cancer Daughter   . Cancer Sister        lung cancer  . Heart disease Sister        AMI  . Diabetes Other   . Thyroid disease Other     ROS- All systems are reviewed and negative except as per the HPI above  Physical Exam: Vitals:   01/20/20 1402  BP: 112/84  Pulse: (!) 132  SpO2: 99%  Weight: 96.2 kg  Height: 5' 1"  (1.549 m)   Wt Readings from Last 3 Encounters:  01/20/20 96.2 kg  09/03/19 94.8 kg  08/27/19 93.9 kg    Labs: Lab Results  Component Value Date   NA 137 07/21/2019   K 4.1 07/21/2019   CL 106 07/21/2019   CO2 20 (L)  07/21/2019   GLUCOSE 93 07/21/2019   BUN 21 07/21/2019   CREATININE 1.00 07/21/2019   CALCIUM 8.6 (L) 07/21/2019   MG 2.2 10/24/2018   Lab Results  Component Value Date   INR 1.05 07/25/2012   Lab Results  Component Value Date   CHOL 169 03/14/2015   HDL 71 03/14/2015   LDLCALC 79 03/14/2015   TRIG 94 03/14/2015     GEN- The patient is well appearing, alert and oriented x 3 today.   Head- normocephalic, atraumatic Eyes-  Sclera clear, conjunctiva pink Ears- hearing intact Oropharynx- clear Neck- supple, no JVP Lymph- no cervical lymphadenopathy Lungs- Clear to ausculation bilaterally, normal work of breathing Heart-regular rate and rhythm, no murmurs, rubs or gallops, PMI not laterally displaced GI- soft, NT, ND, + BS Extremities- no clubbing, cyanosis, or edema MS- no significant deformity or atrophy Skin- no rash or lesion Psych- euthymic mood, full affect Neuro- strength and sensation are intact  EKG-Sinus rhythm at 73 bpm, PR int 162 ms, qrs int 98 ms, qtc 407 ms Epic records reviewed   Assessment and Plan: 1. Afib  In SR today with PAC's  Previously  treated with Tikosyn and amiodarone  Will restart metoprolol succinate 12.5 mg daily for palpitations  Continue Cardizem 240 mg daily  She has previously not wanted to go down repeat ablation path    2.CHA2DS2VASc score is 2 Continue Xarelto 20  mg a day  F/u 6 months   Butch Penny C. Birgitta Uhlir, [facility] [address] [place], [place] [address] [phone]

## 2020-02-25 ENCOUNTER — Other Ambulatory Visit (HOSPITAL_COMMUNITY): Payer: Self-pay | Admitting: Nurse Practitioner

## 2020-04-08 ENCOUNTER — Other Ambulatory Visit: Payer: Self-pay

## 2020-04-08 ENCOUNTER — Ambulatory Visit (INDEPENDENT_AMBULATORY_CARE_PROVIDER_SITE_OTHER): Payer: Medicare Other | Admitting: Emergency Medicine

## 2020-04-08 ENCOUNTER — Encounter: Payer: Self-pay | Admitting: Emergency Medicine

## 2020-04-08 VITALS — BP 113/60 | HR 56 | Temp 98.2°F | Resp 16 | Ht 61.0 in | Wt 212.4 lb

## 2020-04-08 DIAGNOSIS — I480 Paroxysmal atrial fibrillation: Secondary | ICD-10-CM | POA: Diagnosis not present

## 2020-04-08 DIAGNOSIS — Z1231 Encounter for screening mammogram for malignant neoplasm of breast: Secondary | ICD-10-CM

## 2020-04-08 DIAGNOSIS — M159 Polyosteoarthritis, unspecified: Secondary | ICD-10-CM

## 2020-04-08 DIAGNOSIS — Z23 Encounter for immunization: Secondary | ICD-10-CM

## 2020-04-08 DIAGNOSIS — M8949 Other hypertrophic osteoarthropathy, multiple sites: Secondary | ICD-10-CM

## 2020-04-08 DIAGNOSIS — F32A Depression, unspecified: Secondary | ICD-10-CM

## 2020-04-08 MED ORDER — DICLOFENAC-MISOPROSTOL 75-0.2 MG PO TBEC: 1.0000 | DELAYED_RELEASE_TABLET | Freq: Two times a day (BID) | ORAL | 3 refills | Status: DC

## 2020-04-08 NOTE — Patient Instructions (Addendum)
   If you have lab work done today you will be contacted with your lab results within the next 2 weeks.  If you have not heard from us then please contact us. The fastest way to get your results is to register for My Chart.   IF you received an x-ray today, you will receive an invoice from [facility]. Please contact [facility] at [phone] with questions or concerns regarding your invoice.   IF you received labwork today, you will receive an invoice from [facility]. Please contact [facility] at [phone] with questions or concerns regarding your invoice.   Our billing staff will not be able to assist you with questions regarding bills from these companies.  You will be contacted with the lab results as soon as they are available. The fastest way to get your results is to activate your My Chart account. Instructions are located on the last page of this paperwork. If you have not heard from us regarding the results in 2 weeks, please contact this office.     Health Maintenance After Age 65 After age 65, you are at a higher risk for certain long-term diseases and infections as well as injuries from falls. Falls are a major cause of broken bones and head injuries in people who are older than age 65. Getting regular preventive care can help to keep you healthy and well. Preventive care includes getting regular testing and making lifestyle changes as recommended by your health care provider. Talk with your health care provider about:  Which screenings and tests you should have. A screening is a test that checks for a disease when you have no symptoms.  A diet and exercise plan that is right for you. What should I know about screenings and tests to prevent falls? Screening and testing are the best ways to find a health problem early. Early diagnosis and treatment give you the best chance of managing medical conditions that are common after age 65. Certain conditions and  lifestyle choices may make you more likely to have a fall. Your health care provider may recommend:  Regular vision checks. Poor vision and conditions such as cataracts can make you more likely to have a fall. If you wear glasses, make sure to get your prescription updated if your vision changes.  Medicine review. Work with your health care provider to regularly review all of the medicines you are taking, including over-the-counter medicines. Ask your health care provider about any side effects that may make you more likely to have a fall. Tell your health care provider if any medicines that you take make you feel dizzy or sleepy.  Osteoporosis screening. Osteoporosis is a condition that causes the bones to get weaker. This can make the bones weak and cause them to break more easily.  Blood pressure screening. Blood pressure changes and medicines to control blood pressure can make you feel dizzy.  Strength and balance checks. Your health care provider may recommend certain tests to check your strength and balance while standing, walking, or changing positions.  Foot health exam. Foot pain and numbness, as well as not wearing proper footwear, can make you more likely to have a fall.  Depression screening. You may be more likely to have a fall if you have a fear of falling, feel emotionally low, or feel unable to do activities that you used to do.  Alcohol use screening. Using too much alcohol can affect your balance and may make you more likely to   have a fall. What actions can I take to lower my risk of falls? General instructions  Talk with your health care provider about your risks for falling. Tell your health care provider if: ? You fall. Be sure to tell your health care provider about all falls, even ones that seem minor. ? You feel dizzy, sleepy, or off-balance.  Take over-the-counter and prescription medicines only as told by your health care provider. These include any  supplements.  Eat a healthy diet and maintain a healthy weight. A healthy diet includes low-fat dairy products, low-fat (lean) meats, and fiber from whole grains, beans, and lots of fruits and vegetables. Home safety  Remove any tripping hazards, such as rugs, cords, and clutter.  Install safety equipment such as grab bars in bathrooms and safety rails on stairs.  Keep rooms and walkways well-lit. Activity   Follow a regular exercise program to stay fit. This will help you maintain your balance. Ask your health care provider what types of exercise are appropriate for you.  If you need a cane or walker, use it as recommended by your health care provider.  Wear supportive shoes that have nonskid soles. Lifestyle  Do not drink alcohol if your health care provider tells you not to drink.  If you drink alcohol, limit how much you have: ? 0-1 drink a day for women. ? 0-2 drinks a day for men.  Be aware of how much alcohol is in your drink. In the [place] one drink equals one typical bottle of beer (12 oz), one-half glass of wine (5 oz), or one shot of hard liquor (1 oz).  Do not use any products that contain nicotine or tobacco, such as cigarettes and e-cigarettes. If you need help quitting, ask your health care provider. Summary  Having a healthy lifestyle and getting preventive care can help to protect your health and wellness after age 65.  Screening and testing are the best way to find a health problem early and help you avoid having a fall. Early diagnosis and treatment give you the best chance for managing medical conditions that are more common for people who are older than age 65.  Falls are a major cause of broken bones and head injuries in people who are older than age 65. Take precautions to prevent a fall at home.  Work with your health care provider to learn what changes you can make to improve your health and wellness and to prevent falls. This information is not intended  to replace advice given to you by your health care provider. Make sure you discuss any questions you have with your health care provider. Document Revised: 09/19/2018 Document Reviewed: 04/11/2017 Elsevier Patient Education  2020 [employer]  

## 2020-04-08 NOTE — Progress Notes (Signed)
Angela Cummings 64 y.o.   Chief Complaint  Patient presents with  . Medication Refill    Diclofenac-misoprostol    HISTORY OF PRESENT ILLNESS: This is a 64 y.o. female with history of osteoarthritis here for follow-up and medication refill of diclofenac-misoprostol. Also has a history of chronic atrial fibrillation on long-term anticoagulation with Xarelto. Also has a history of chronic depression since her son passed away over a year ago. No other complaints or medical concerns today.  HPI   Prior to Admission medications   Medication Sig Start Date End Date Taking? Authorizing Provider  acetaminophen (TYLENOL) 500 MG tablet Take by mouth as needed.    Yes [provider]  Diclofenac-miSOPROStol 75-0.2 MG TBEC TAKE 1 TABLET BY MOUTH TWICE A DAY 02/25/19  Yes Jaimy Kliethermes, Ines Bloomer, MD  KLOR-CON M20 20 MEQ tablet TAKE 2 TABLETS (40 MEQ TOTAL) 2 (TWO) TIMES DAILY BY MOUTH. 11/24/19  Yes Sherran Needs, NP  LORazepam (ATIVAN) 1 MG tablet as needed.  01/14/19  Yes [provider]  metoprolol succinate (TOPROL XL) 25 MG 24 hr tablet Take 0.5 tablets (12.5 mg total) by mouth daily. 01/20/20 01/19/21 Yes Sherran Needs, NP  pantoprazole (PROTONIX) 40 MG tablet TAKE 1 TABLET BY MOUTH TWICE A DAY 08/01/19  Yes Horald Pollen, MD  PROVENTIL HFA 108 [phone] Base) MCG/ACT inhaler INHALE 2 PUFFS INTO THE LUNGS EVERY 6 HOURS AS NEEDED 02/16/16  Yes Wardell Honour, MD  TIADYLT ER 240 MG 24 hr capsule TAKE 1 CAPSULE BY MOUTH EVERY DAY 10/10/19  Yes Sherran Needs, NP  XARELTO 20 MG TABS tablet TAKE 1 TABLET EVERY DAY WITH SUPPER 10/27/19  Yes Sherran Needs, NP    Allergies  Allergen Reactions  . Adhesive [Tape] Itching and Rash    Paper tape is better but best to use no adhesive if possible    Patient Active Problem List   Diagnosis Date Noted  . Osteoarthritis of left foot 10/06/2017  . GERD (gastroesophageal reflux disease) 08/23/2017  . Fibromyalgia 03/14/2015  .  Degenerative disc disease, cervical 03/14/2015  . Primary osteoarthritis involving multiple joints 03/14/2015  . Depression 02/18/2014  . Asthma   . PAF (paroxysmal atrial fibrillation) ([place]) 11/28/2010  . Obesity 11/28/2010    Past Medical History:  Diagnosis Date  . Anxiety   . Asthma   . Atrial fibrillation ([place]) 11/2010   paroxysmal  . Bradycardia   . Depression   . DJD (degenerative joint disease)    Cervical spine  . Fibromyalgia    s/p rheumatology consultation/[facility]  . Mild sleep apnea   . Obesity   . Osteoarthritis    Shoulders, hips,knees    Past Surgical History:  Procedure Laterality Date  . ABLATION  08/2012   PVI by Dr [name]  . ABLATION  11/13/2013   PVI by Dr [name]  . ATRIAL FIBRILLATION ABLATION N/A 09/03/2012   Procedure: ATRIAL FIBRILLATION ABLATION;  Surgeon: Thompson Grayer, MD;  [facility] C[facility] LAB;  Service: Cardiovascular;  Laterality: N/A;  . ATRIAL FIBRILLATION ABLATION N/A 11/13/2013   Procedure: ATRIAL FIBRILLATION ABLATION;  Surgeon: Coralyn Mark, MD;  Location: [facility] CATH LAB;  Service: Cardiovascular;  Laterality: N/A;  . CARDIOVERSION  03/13/2012   Procedure: CARDIOVERSION;  Surgeon: [name], MD;  Location: [facility] ENDOSCOPY;  Service: Cardiovascular;  Laterality: N/A;  . CARDIOVERSION N/A 07/26/2012   Procedure: CARDIOVERSION;  Surgeon: Thompson Grayer, MD;  Location: [place];  Service: Cardiovascular;  Laterality:  N/A;  . CARDIOVERSION N/A 02/20/2014   Procedure: CARDIOVERSION;  Surgeon: Larey Dresser, MD;  Location: [place];  Service: Cardiovascular;  Laterality: N/A;  . CESAREAN SECTION    . CHOLECYSTECTOMY    . HERNIA REPAIR    . KNEE SURGERY    . neck tumor resection     benign  . TEE WITHOUT CARDIOVERSION  03/13/2012   Procedure: TRANSESOPHAGEAL ECHOCARDIOGRAM (TEE);  Surgeon: [name], MD;  Location: [place];  Service: Cardiovascular;  Laterality: N/A;  Rm 2029  . TEE WITHOUT CARDIOVERSION N/A 09/02/2012   Procedure:  TRANSESOPHAGEAL ECHOCARDIOGRAM (TEE);  Surgeon: Josue Hector, MD;  Location: [place];  Service: Cardiovascular;  Laterality: N/A;  . TEE WITHOUT CARDIOVERSION N/A 11/13/2013   Procedure: TRANSESOPHAGEAL ECHOCARDIOGRAM (TEE);  Surgeon: [name], MD;  Location[facility] ENDOSCOPY;  Service: Cardiovascular;  Laterality: N/A;    Social History   Socioeconomic History  . Marital status: Widowed    Spouse name: Not on file  . Number of children: 3  . Years of education: Not on file  . Highest education level: Not on file  Occupational History  . Not on file  Tobacco Use  . Smoking status: Never Smoker  . Smokeless tobacco: Never Used  Substance and Sexual Activity  . Alcohol use: Yes    Comment: Rarely- once every few months  . Drug use: No  . Sexual activity: Never  Other Topics Concern  . Not on file  Social History Narrative   Marital status: widowed since 01/2014 of melanoma; not dating.       Children:  3 children (17,30, 32) and 4 grandchildren and 1 gg. 1 adopted child.       Lives with son.  Lives in [place].        Employment:  Unemployed.       Tobacco: none      Alcohol: none      Drugs: none      Exercise: none   Social Determinants of Health   Financial Resource Strain:   . Difficulty of Paying Living Expenses: Not on file  Food Insecurity:   . Worried About Charity fundraiser in the Last Year: Not on file  . Ran Out of Food in the Last Year: Not on file  Transportation Needs:   . Lack of Transportation (Medical): Not on file  . Lack of Transportation (Non-Medical): Not on file  Physical Activity:   . Days of Exercise per Week: Not on file  . Minutes of Exercise per Session: Not on file  Stress:   . Feeling of Stress : Not on file  Social Connections:   . Frequency of Communication with Friends and Family: Not on file  . Frequency of Social Gatherings with Friends and Family: Not on file  . Attends Religious Services: Not on file  . Active Member  of Clubs or Organizations: Not on file  . Attends Archivist Meetings: Not on file  . Marital Status: Not on file  Intimate Partner Violence:   . Fear of Current or Ex-Partner: Not on file  . Emotionally Abused: Not on file  . Physically Abused: Not on file  . Sexually Abused: Not on file    Family History  Problem Relation Age of Onset  . Emphysema Mother   . COPD Mother   . Anxiety disorder Mother   . Cancer Father        lung cancer; non-smoker  .  Heart disease Father 29       AMI  . COPD Sister   . Emphysema Brother   . Cancer Daughter   . Cancer Sister        lung cancer  . Heart disease Sister        AMI  . Diabetes Other   . Thyroid disease Other      Review of Systems  Constitutional: Negative.  Negative for chills and fever.  HENT: Negative for congestion and sore throat.   Respiratory: Negative.  Negative for cough and shortness of breath.   Cardiovascular: Negative.  Negative for chest pain and palpitations.  Gastrointestinal: Negative.  Negative for abdominal pain, diarrhea, nausea and vomiting.  Genitourinary: Negative.  Negative for dysuria and hematuria.  Musculoskeletal: Positive for joint pain.  Skin: Negative.  Negative for rash.  Neurological: Negative.  Negative for dizziness and headaches.  All other systems reviewed and are negative.   Today's Vitals   04/08/20 1041  BP: 113/60  Pulse: (!) 56  Resp: 16  Temp: 98.2 F (36.8 C)  TempSrc: Temporal  SpO2: 98%  Weight: 212 lb 6.4 oz (96.3 kg)  Height: 5\' 1"  (1.549 m)   Body mass index is 40.13 kg/m. Wt Readings from Last 3 Encounters:  04/08/20 212 lb 6.4 oz (96.3 kg)  01/20/20 212 lb (96.2 kg)  09/03/19 209 lb (94.8 kg)    Physical Exam Vitals reviewed.  Constitutional:      Appearance: Normal appearance.  HENT:     Head: Normocephalic.  Eyes:     Extraocular Movements: Extraocular movements intact.     Pupils: Pupils are equal, round, and reactive to light.    Cardiovascular:     Rate and Rhythm: Normal rate. Rhythm irregular.     Pulses: Normal pulses.     Heart sounds: Normal heart sounds.  Pulmonary:     Effort: Pulmonary effort is normal.     Breath sounds: Normal breath sounds.  Musculoskeletal:     Cervical back: Normal range of motion and neck supple.  Skin:    General: Skin is warm and dry.     Capillary Refill: Capillary refill takes less than 2 seconds.  Neurological:     General: No focal deficit present.     Mental Status: She is alert and oriented to person, place, and time.      ASSESSMENT & PLAN: Gwenetta was seen today for medication refill.  Diagnoses and all orders for this visit:  Primary osteoarthritis involving multiple joints -     Diclofenac-miSOPROStol 75-0.2 MG TBEC; Take 1 tablet by mouth 2 (two) times daily. -     Ambulatory referral to Rheumatology  Obesity, morbid (more than 100 lbs over ideal weight or BMI > 40) (HCC)  Need for prophylactic vaccination and inoculation against influenza -     Flu Vaccine QUAD High Dose(Fluad)  PAF (paroxysmal atrial fibrillation) ([place])  Encounter for screening mammogram for malignant neoplasm of breast -     MM Digital Screening; Future  Chronic depression    Patient Instructions       If you have lab work done today you will be contacted with your lab results within the next 2 weeks.  If you have not heard from [place] then please contact us. The fastest way to get your results is to register for My Chart.   IF you received an x-ray today, you will receive an invoice from [facility] Rad[facility]. Please contact [facility] at  [phone] with questions or concerns regarding your invoice.   IF you received labwork today, you will receive an invoice from [facility]. Please contact [facility] at [phone] with questions or concerns regarding your invoice.   Our billing staff will not be able to assist you with questions regarding bills from these  companies.  You will be contacted with the lab results as soon as they are available. The fastest way to get your results is to activate your My Chart account. Instructions are located on the last page of this paperwork. If you have not heard from [place] regarding the results in 2 weeks, please contact this office.     Health Maintenance After Age 68 After age 29, you are at a higher risk for certain long-term diseases and infections as well as injuries from falls. Falls are a major cause of broken bones and head injuries in people who are older than age 65. Getting regular preventive care can help to keep you healthy and well. Preventive care includes getting regular testing and making lifestyle changes as recommended by your health care provider. Talk with your health care provider about:  Which screenings and tests you should have. A screening is a test that checks for a disease when you have no symptoms.  A diet and exercise plan that is right for you. What should I know about screenings and tests to prevent falls? Screening and testing are the best ways to find a health problem early. Early diagnosis and treatment give you the best chance of managing medical conditions that are common after age 44. Certain conditions and lifestyle choices may make you more likely to have a fall. Your health care provider may recommend:  Regular vision checks. Poor vision and conditions such as cataracts can make you more likely to have a fall. If you wear glasses, make sure to get your prescription updated if your vision changes.  Medicine review. Work with your health care provider to regularly review all of the medicines you are taking, including over-the-counter medicines. Ask your health care provider about any side effects that may make you more likely to have a fall. Tell your health care provider if any medicines that you take make you feel dizzy or sleepy.  Osteoporosis screening. Osteoporosis is a  condition that causes the bones to get weaker. This can make the bones weak and cause them to break more easily.  Blood pressure screening. Blood pressure changes and medicines to control blood pressure can make you feel dizzy.  Strength and balance checks. Your health care provider may recommend certain tests to check your strength and balance while standing, walking, or changing positions.  Foot health exam. Foot pain and numbness, as well as not wearing proper footwear, can make you more likely to have a fall.  Depression screening. You may be more likely to have a fall if you have a fear of falling, feel emotionally low, or feel unable to do activities that you used to do.  Alcohol use screening. Using too much alcohol can affect your balance and may make you more likely to have a fall. What actions can I take to lower my risk of falls? General instructions  Talk with your health care provider about your risks for falling. Tell your health care provider if: ? You fall. Be sure to tell your health care provider about all falls, even ones that seem minor. ? You feel dizzy, sleepy, or off-balance.  Take over-the-counter and prescription medicines only as  told by your health care provider. These include any supplements.  Eat a healthy diet and maintain a healthy weight. A healthy diet includes low-fat dairy products, low-fat (lean) meats, and fiber from whole grains, beans, and lots of fruits and vegetables. Home safety  Remove any tripping hazards, such as rugs, cords, and clutter.  Install safety equipment such as grab bars in bathrooms and safety rails on stairs.  Keep rooms and walkways well-lit. Activity   Follow a regular exercise program to stay fit. This will help you maintain your balance. Ask your health care provider what types of exercise are appropriate for you.  If you need a cane or walker, use it as recommended by your health care provider.  Wear supportive shoes  that have nonskid soles. Lifestyle  Do not drink alcohol if your health care provider tells you not to drink.  If you drink alcohol, limit how much you have: ? 0-1 drink a day for women. ? 0-2 drinks a day for men.  Be aware of how much alcohol is in your drink. In the [place] one drink equals one typical bottle of beer (12 oz), one-half glass of wine (5 oz), or one shot of hard liquor (1 oz).  Do not use any products that contain nicotine or tobacco, such as cigarettes and e-cigarettes. If you need help quitting, ask your health care provider. Summary  Having a healthy lifestyle and getting preventive care can help to protect your health and wellness after age 102.  Screening and testing are the best way to find a health problem early and help you avoid having a fall. Early diagnosis and treatment give you the best chance for managing medical conditions that are more common for people who are older than age 26.  Falls are a major cause of broken bones and head injuries in people who are older than age 27. Take precautions to prevent a fall at home.  Work with your health care provider to learn what changes you can make to improve your health and wellness and to prevent falls. This information is not intended to replace advice given to you by your health care provider. Make sure you discuss any questions you have with your health care provider. Document Revised: 09/19/2018 Document Reviewed: 04/11/2017 Elsevier Patient Education  2020 Elsevier Inc.      Agustina Caroli, MD [facility]

## 2020-04-10 ENCOUNTER — Other Ambulatory Visit: Payer: Self-pay | Admitting: Emergency Medicine

## 2020-04-10 DIAGNOSIS — M8949 Other hypertrophic osteoarthropathy, multiple sites: Secondary | ICD-10-CM

## 2020-04-10 DIAGNOSIS — M159 Polyosteoarthritis, unspecified: Secondary | ICD-10-CM

## 2020-04-10 NOTE — Telephone Encounter (Signed)
Requested medication (s) are due for refill today: no  Requested medication (s) are on the active medication list: yes  Last refill:  04/08/20  Future visit scheduled: yes  Notes to clinic:  pt wants change of pharmacy of non delegated med    Requested Prescriptions  Pending Prescriptions Disp Refills   Diclofenac-miSOPROStol 75-0.2 MG TBEC [Pharmacy Med Name: DICLOFENAC-MISOPROST 75-200 TB] 60 tablet 1    Sig: TAKE 1 TABLET BY MOUTH TWICE A DAY      Not Delegated - Analgesics:  Antirheumatic Agents - diclofenac/misoprostol Failed - 04/10/2020 11:04 AM      Failed - This refill cannot be delegated      Failed - HGB in normal range and within 360 days    Hemoglobin  Date Value Ref Range Status  04/08/2019 12.0 12.0 - 15.0 g/dL Final          Passed - Cr in normal range and within 360 days    Creat  Date Value Ref Range Status  12/30/2015 0.70 0.50 - 0.99 mg/dL Final    Comment:      For patients > or = 66 years of age: The upper reference limit for Creatinine is approximately 13% higher for people identified as African-American.      Creatinine, Ser  Date Value Ref Range Status  07/21/2019 1.00 0.44 - 1.00 mg/dL Final          Passed - Patient is not pregnant      Passed - Valid encounter within last 12 months    Recent Outpatient Visits           2 days ago Primary osteoarthritis involving multiple joints   Primary Care at [facility], [place], [place]   7 months ago Chronic pain of left knee   Primary Care at [facility], Ines Bloomer, MD   1 year ago Localized swelling of left foot   Primary Care at [facility], Ines Bloomer, MD   1 year ago Acute depression   Primary Care at [place], Ines Bloomer, MD   1 year ago Situational anxiety   Primary Care at [facility], Ines Bloomer, MD       Future Appointments             In 6 months [place], Ines Bloomer, MD Primary Care at [place], [facility]

## 2020-04-12 ENCOUNTER — Telehealth: Payer: Self-pay | Admitting: Emergency Medicine

## 2020-04-12 NOTE — Telephone Encounter (Signed)
Pt. Called [facility] again requesting immediate action. [facility] agent informed I would route the message at high priority to our pool. Please Advise

## 2020-04-12 NOTE — Telephone Encounter (Signed)
This medication has been previously routed to PCP (04/10/20); awaiting review/ recommendation.

## 2020-04-12 NOTE — Telephone Encounter (Signed)
PT need a refill  Diclofenac-miSOPROStol 75-0.2 MG TBEC [[id number]]  [id number] - [place], [place] - [place] [address]  Phone: [phone] Fax: [phone]

## 2020-04-19 ENCOUNTER — Encounter: Payer: Self-pay | Admitting: *Deleted

## 2020-04-19 ENCOUNTER — Telehealth: Payer: Self-pay | Admitting: *Deleted

## 2020-04-19 NOTE — Telephone Encounter (Signed)
Schedule a mobile mammogram

## 2020-04-20 ENCOUNTER — Encounter: Payer: Self-pay | Admitting: *Deleted

## 2020-05-20 ENCOUNTER — Ambulatory Visit: Payer: Medicare Other | Admitting: Internal Medicine

## 2020-06-06 ENCOUNTER — Other Ambulatory Visit (HOSPITAL_COMMUNITY): Payer: Self-pay | Admitting: Nurse Practitioner

## 2020-06-25 ENCOUNTER — Ambulatory Visit: Payer: Medicare Other | Admitting: Internal Medicine

## 2020-06-28 ENCOUNTER — Ambulatory Visit: Payer: Medicare Other | Admitting: Internal Medicine

## 2020-07-21 ENCOUNTER — Encounter (HOSPITAL_COMMUNITY): Payer: Self-pay | Admitting: Nurse Practitioner

## 2020-07-21 ENCOUNTER — Other Ambulatory Visit: Payer: Self-pay

## 2020-07-21 ENCOUNTER — Ambulatory Visit (HOSPITAL_COMMUNITY)
Admission: RE | Admit: 2020-07-21 | Discharge: 2020-07-21 | Disposition: A | Discharge status: Home / Self Care | Payer: Medicare HMO | Source: Ambulatory Visit | Attending: Nurse Practitioner | Admitting: Nurse Practitioner

## 2020-07-21 VITALS — BP 132/72 | HR 120 | Ht 61.0 in | Wt 218.2 lb

## 2020-07-21 DIAGNOSIS — D6869 Other thrombophilia: Secondary | ICD-10-CM | POA: Diagnosis not present

## 2020-07-21 DIAGNOSIS — I4510 Unspecified right bundle-branch block: Secondary | ICD-10-CM | POA: Diagnosis not present

## 2020-07-21 DIAGNOSIS — I4819 Other persistent atrial fibrillation: Secondary | ICD-10-CM | POA: Insufficient documentation

## 2020-07-21 DIAGNOSIS — I440 Atrioventricular block, first degree: Secondary | ICD-10-CM | POA: Insufficient documentation

## 2020-07-21 DIAGNOSIS — Z7901 Long term (current) use of anticoagulants: Secondary | ICD-10-CM | POA: Insufficient documentation

## 2020-07-21 DIAGNOSIS — I480 Paroxysmal atrial fibrillation: Secondary | ICD-10-CM | POA: Diagnosis not present

## 2020-07-21 LAB — BASIC METABOLIC PANEL
Anion gap: 11 (ref 5–15)
BUN: 10 mg/dL (ref 8–23)
CO2: 24 mmol/L (ref 22–32)
Calcium: 9 mg/dL (ref 8.9–10.3)
Chloride: 105 mmol/L (ref 98–111)
Creatinine, Ser: 0.8 mg/dL (ref 0.44–1.00)
GFR, Estimated: >60 mL/min (ref >60)
Glucose, Bld: 89 mg/dL (ref 70–99)
Potassium: 4.1 mmol/L (ref 3.5–5.1)
Sodium: 140 mmol/L (ref 135–145)

## 2020-07-21 LAB — CBC
HCT: 40.8 % (ref 36.0–46.0)
Hemoglobin: 12.5 g/dL (ref 12.0–15.0)
MCH: 25.8 pg — ABNORMAL LOW (ref 26.0–34.0)
MCHC: 30.6 g/dL (ref 30.0–36.0)
MCV: 84.1 fL (ref 80.0–100.0)
Platelets: 302 10*3/uL (ref 150–400)
RBC: 4.85 MIL/uL (ref 3.87–5.11)
RDW: 14 % (ref 11.5–15.5)
WBC: 5.4 10*3/uL (ref 4.0–10.5)
nRBC: 0 % (ref 0.0–0.2)

## 2020-07-21 NOTE — Progress Notes (Signed)
Primary Care Physician: Horald Pollen, MD Referring Physician: Dr. [name] is a 65 y.o. female with a h/o persistent afib s/p ablation x2  that failed Tikosyn back in May, after being seen by Dr. [name] MAy 2020  and was started on amiodarone 200 mg bid to f/u in the afib clinic in  3 weeks. Pt failed to come to clinic and had to refuse her refills for her to agree to come in today. She has been taking 200 mg bid of amiodarone since May.  She last had a successful cardioversion early May.  Ekg today shows afib at 112 bpm, however, she feels that she is not in afib all the time as she has some days she reports HR's in the 60-70's range. She states that she feels well. She will lower amiodarone to one tab, 200 mg a day. She feels that she has not been taking her metoprolol as she felt her HR was in control. No missed xarelto for at least 3 weeks.  Pt is back in afib clinic, 11/18 after wearing a zio patch was found  to being out of rhythm  51% while wearing the monitor, However, she is in SR today. She is on amiodarone 200 mg daily for several weeks. Her Alk phos and TSH were elevated on last visit and will repeat  labs today .    F/u in afib clinic 06/11/19. She is in SR but has developed a long  first degree AV block and also has a known RBBB. Continues on 200 mg amiodarone daily as well as 12.5 mg metoprolol succinate bid.   Discussed with Dr. [name], She has not noted any afib. He suggested to stop BB and decrease amiodarone to 100 mg daily.CHA2DS2VASc score of 2.   F/u in afib clinic, 07/21/19. She has now been off amiodarone for 4-6 weeks as it was causing EKG changes as well as elevated TSH/liver enzymes. Now in the office, she is in Sinus brady with first degree AV block no longer noted and and IRBBB. She feels well.   F/u in afib clinic, 8/10. She  has noted more palpitations. She is in SR with PAC's. Will restart low dose  BB. Continues  on xarelto 20 mg daily for a  CHA2DS2VASc score of 2.   F/u in the afib clinic, 07/21/20. She  was in a rush this am and forgot to take am BB. She  is in a sinus tach at 120 bpm. She has felt dizzy at times. No blood work x one year. She remains on xarelto with a CHA2DS2VASc score of 2.   Today, she denies symptoms of palpitations, chest pain, shortness of breath, orthopnea, PND, lower extremity edema, dizziness, presyncope, syncope, or neurologic sequela. The patient is tolerating medications without difficulties and is otherwise without complaint today.   Past Medical History:  Diagnosis Date  . Anxiety   . Asthma   . Atrial fibrillation ([place]) 11/2010   paroxysmal  . Bradycardia   . Depression   . DJD (degenerative joint disease)    Cervical spine  . Fibromyalgia    s/p rheumatology consultation/Zeminsky  . Mild sleep apnea   . Obesity   . Osteoarthritis    Shoulders, hips,knees   Past Surgical History:  Procedure Laterality Date  . ABLATION  08/2012   PVI by Dr [name]  . ABLATION  11/13/2013   PVI by Dr [name]  . ATRIAL FIBRILLATION ABLATION N/A 09/03/2012  Procedure: ATRIAL FIBRILLATION ABLATION;  Surgeon: Thompson Grayer, MD;  Location: [facility] CATH LAB;  Service: Cardiovascular;  Laterality: N/A;  . ATRIAL FIBRILLATION ABLATION N/A 11/13/2013   Procedure: ATRIAL FIBRILLATION ABLATION;  Surgeon: Coralyn Mark, MD;  Location: [place] CATH LAB;  Service: Cardiovascular;  Laterality: N/A;  . CARDIOVERSION  03/13/2012   Procedure: CARDIOVERSION;  Surgeon: [name], MD;  Location: [facility] ENDOSCOPY;  Service: Cardiovascular;  Laterality: N/A;  . CARDIOVERSION N/A 07/26/2012   Procedure: CARDIOVERSION;  Surgeon: Thompson Grayer, MD;  Location: [place];  Service: Cardiovascular;  Laterality: N/A;  . CARDIOVERSION N/A 02/20/2014   Procedure: CARDIOVERSION;  Surgeon: Larey Dresser, MD;  Location: [place];  Service: Cardiovascular;  Laterality: N/A;  . CESAREAN SECTION    . CHOLECYSTECTOMY    . HERNIA REPAIR    . KNEE SURGERY     . neck tumor resection     benign  . TEE WITHOUT CARDIOVERSION  03/13/2012   Procedure: TRANSESOPHAGEAL ECHOCARDIOGRAM (TEE);  Surgeon: [name], MD;  Location: [place];  Service: Cardiovascular;  Laterality: N/A;  Rm 2029  . TEE WITHOUT CARDIOVERSION N/A 09/02/2012   Procedure: TRANSESOPHAGEAL ECHOCARDIOGRAM (TEE);  Surgeon: Josue Hector, MD;  Location: [place];  Service: Cardiovascular;  Laterality: N/A;  . TEE WITHOUT CARDIOVERSION N/A 11/13/2013   Procedure: TRANSESOPHAGEAL ECHOCARDIOGRAM (TEE);  Surgeon: [name], MD;  Location: [facility] ENDOSCOPY;  Service: Cardiovascular;  Laterality: N/A;    Current Outpatient Medications  Medication Sig Dispense Refill  . acetaminophen (TYLENOL) 500 MG tablet Take by mouth as needed.     . Diclofenac-miSOPROStol 75-0.2 MG TBEC TAKE 1 TABLET BY MOUTH TWICE A DAY 60 tablet 1  . KLOR-CON M20 20 MEQ tablet TAKE 2 TABLETS (40 MEQ TOTAL) 2 (TWO) TIMES DAILY BY MOUTH. 120 tablet 8  . LORazepam (ATIVAN) 1 MG tablet Take 1 mg by mouth as needed.    . metoprolol succinate (TOPROL XL) 25 MG 24 hr tablet Take 0.5 tablets (12.5 mg total) by mouth daily. 30 tablet 6  . pantoprazole (PROTONIX) 40 MG tablet TAKE 1 TABLET BY MOUTH TWICE A DAY 180 tablet 0  . PROVENTIL HFA 108 (90 Base) MCG/ACT inhaler INHALE 2 PUFFS INTO THE LUNGS EVERY 6 HOURS AS NEEDED 6.7 g 0  . TIADYLT ER 240 MG 24 hr capsule TAKE 1 CAPSULE BY MOUTH EVERY DAY 90 capsule 0  . XARELTO 20 MG TABS tablet TAKE 1 TABLET EVERY DAY WITH SUPPER 90 tablet 2   No current facility-administered medications for this encounter.    Allergies  Allergen Reactions  . Adhesive [Tape] Itching and Rash    Paper tape is better but best to use no adhesive if possible    Social History   Socioeconomic History  . Marital status: Widowed    Spouse name: Not on file  . Number of children: 3  . Years of education: Not on file  . Highest education level: Not on file  Occupational History  .  Not on file  Tobacco Use  . Smoking status: Never Smoker  . Smokeless tobacco: Never Used  Substance and Sexual Activity  . Alcohol use: Yes    Comment: Rarely- once every few months  . Drug use: No  . Sexual activity: Never  Other Topics Concern  . Not on file  Social History Narrative   Marital status: widowed since 01/2014 of melanoma; not dating.       Children:  3 children (17,30, 32)  and 4 grandchildren and 1 gg. 1 adopted child.       Lives with son.  Lives in [place].        Employment:  Unemployed.       Tobacco: none      Alcohol: none      Drugs: none      Exercise: none   Social Determinants of Health   Financial Resource Strain: Not on file  Food Insecurity: Not on file  Transportation Needs: Not on file  Physical Activity: Not on file  Stress: Not on file  Social Connections: Not on file  Intimate Partner Violence: Not on file    Family History  Problem Relation Age of Onset  . Emphysema Mother   . COPD Mother   . Anxiety disorder Mother   . Cancer Father        lung cancer; non-smoker  . Heart disease Father 39       AMI  . COPD Sister   . Emphysema Brother   . Cancer Daughter   . Cancer Sister        lung cancer  . Heart disease Sister        AMI  . Diabetes Other   . Thyroid disease Other     ROS- All systems are reviewed and negative except as per the HPI above  Physical Exam: Vitals:   07/21/20 0841  BP: 132/72  Pulse: (!) 120  Weight: 99 kg  Height: 5' 1"  (1.549 m)   Wt Readings from Last 3 Encounters:  07/21/20 99 kg  04/08/20 96.3 kg  01/20/20 96.2 kg    Labs: Lab Results  Component Value Date   NA 137 07/21/2019   K 4.1 07/21/2019   CL 106 07/21/2019   CO2 20 (L) 07/21/2019   GLUCOSE 93 07/21/2019   BUN 21 07/21/2019   CREATININE 1.00 07/21/2019   CALCIUM 8.6 (L) 07/21/2019   MG 2.2 10/24/2018   Lab Results  Component Value Date   INR 1.05 07/25/2012   Lab Results  Component Value Date   CHOL 169  03/14/2015   HDL 71 03/14/2015   LDLCALC 79 03/14/2015   TRIG 94 03/14/2015     GEN- The patient is well appearing, alert and oriented x 3 today.   Head- normocephalic, atraumatic Eyes-  Sclera clear, conjunctiva pink Ears- hearing intact Oropharynx- clear Neck- supple, no JVP Lymph- no cervical lymphadenopathy Lungs- Clear to ausculation bilaterally, normal work of breathing Heart-rapid regular rate and rhythm, no murmurs, rubs or gallops, PMI not laterally displaced GI- soft, NT, ND, + BS Extremities- no clubbing, cyanosis, or edema MS- no significant deformity or atrophy Skin- no rash or lesion Psych- euthymic mood, full affect Neuro- strength and sensation are intact  EKG-sinus tach at 120 bpm, PR int 124 ms, qrs int 114 ms, qtc 489 ms  Epic records reviewed   Assessment and Plan: 1. Afib  In S.tach at 120 bpm with PAC's  Did not take BB this am  Previously  treated with Tikosyn and amiodarone  Continue metoprolol succinate 12.5 mg daily   Continue Cardizem 240 mg daily  She has previously not wanted to go down repeat ablation path  2.CHA2DS2VASc score is 2 Continue Xarelto 20 mg a day Cbc/bmet today   F/u one week for EKG to make sure in SR with controlled rates   Butch Penny C. Yina Riviere, [facility] [address] [place], [place] [address] [phone]

## 2020-07-28 ENCOUNTER — Other Ambulatory Visit: Payer: Self-pay

## 2020-07-28 ENCOUNTER — Ambulatory Visit (HOSPITAL_COMMUNITY)
Admission: RE | Admit: 2020-07-28 | Discharge: 2020-07-28 | Disposition: A | Discharge status: Home / Self Care | Payer: Medicare HMO | Source: Ambulatory Visit | Attending: Nurse Practitioner | Admitting: Nurse Practitioner

## 2020-07-28 ENCOUNTER — Other Ambulatory Visit: Payer: Self-pay | Admitting: Emergency Medicine

## 2020-07-28 VITALS — BP 126/62 | HR 48

## 2020-07-28 DIAGNOSIS — I480 Paroxysmal atrial fibrillation: Secondary | ICD-10-CM

## 2020-07-28 DIAGNOSIS — M159 Polyosteoarthritis, unspecified: Secondary | ICD-10-CM

## 2020-07-28 DIAGNOSIS — I4891 Unspecified atrial fibrillation: Secondary | ICD-10-CM | POA: Insufficient documentation

## 2020-07-28 DIAGNOSIS — M8949 Other hypertrophic osteoarthropathy, multiple sites: Secondary | ICD-10-CM

## 2020-07-28 NOTE — Progress Notes (Signed)
In for ekg after seeing pt last week with S tach at 120 bpm. Pt reported that she forgot her metoprolol that am. She took her BB this am and is in sinus brady at 48 bpm, at home mostly checks in  the 50's. Is not symptomatic with this. Will see in f/u in 6 months.

## 2020-07-28 NOTE — Telephone Encounter (Signed)
Requested medications are due for refill today.  yes  Requested medications are on the active medications list. yes  Last refill. 04/12/2020  Future visit scheduled.   Yes  Notes to clinic.  Not delegated

## 2020-08-01 NOTE — Progress Notes (Signed)
Office Visit Note  Patient: Angela Cummings             Date of Birth: 1956/03/12           MRN: [id number]             PCP: Horald Pollen, MD Referring: Horald Pollen, * Visit Date: 08/02/2020   Subjective:  New Patient (Initial Visit) (Bil knee pain, right hand pain, right shoulder pain)   History of Present Illness: Angela Cummings is a 65 y.o. female with history of paroxysmal A. fib, asthma, and GERD here for evaluation of osteoarthritis. She has had joint pain ongoing for decades but with recent increase in symptom severity and involvement of her right shoulder right hand and bilateral knees.  She has joint pain throughout the day briefly worsening after periods of rest and with morning stiffness of about 20 minutes duration.  She sometimes notices swelling around the right third finger and in her knees and feet.  She stays active looking after her multiple grandchildren denies activity limiting weakness or falls.  She currently takes diclofenac twice daily which helps her symptoms partially.  In the past she took Celebrex but felt this lost efficacy over time and diclofenac was more beneficial.  She has had previous right shoulder arthroscopy for rotator cuff tear and knee arthroscopy.  She has had surgery on the feet for midfoot joint arthritis. She has previously seen Dr. Marlou Sa with imaging of the lumbar spine and left knee and foot and was treated with intraarticular steroid injection in 08/2019.    Imaging reviewed: 08/2019 Xray left foot FINDINGS: There is moderate arthritis at the second and third tarsal metatarsal joints and between the navicular and the medial cuneiform. Slight bunion formation on the head of the first metatarsal with minimal arthritis of the first MTP joint. Slight arthritic changes at the ankle joint. Chronic calcaneal enthesophytes. Dorsal spurring in the midfoot. There has been slight progression of the arthritic changes in the midfoot since  the prior study. Extensive arterial calcifications at the ankle and foot. No other abnormalities of significance.  IMPRESSION: Slight progression of arthritic changes in the midfoot since the prior study. No other significant change.  Xray left knee FINDINGS: No fracture or dislocation. There is moderately severe arthritis of the patellofemoral compartment. Medial and lateral joint spaces are preserved. Old deformity of the proximal fibular shaft consistent with a remote healed fracture. No appreciable joint effusion.  IMPRESSION: Moderately severe arthritis of the patellofemoral compartment of the left knee.  Activities of Daily Living:  Patient reports morning stiffness for 20 minutes.   Patient Reports nocturnal pain.  Difficulty dressing/grooming: Denies Difficulty climbing stairs: Reports Difficulty getting out of chair: Reports Difficulty using hands for taps, buttons, cutlery, and/or writing: Reports  Review of Systems  Constitutional: Positive for fatigue.  HENT: Negative for mouth dryness.   Eyes: Negative for dryness.  Respiratory: Positive for shortness of breath.   Cardiovascular: Negative for swelling in legs/feet.  Gastrointestinal: Negative for constipation.  Endocrine: Positive for cold intolerance.  Genitourinary: Negative for difficulty urinating.  Musculoskeletal: Positive for arthralgias, gait problem, joint pain, joint swelling and morning stiffness.  Skin: Negative for rash.  Allergic/Immunologic: Negative for susceptible to infections.  Neurological: Negative for numbness.  Hematological: Negative for bruising/bleeding tendency.  Psychiatric/Behavioral: Positive for sleep disturbance.    PMFS History:  Patient Active Problem List   Diagnosis Date Noted  . Osteoarthritis of left foot 10/06/2017  .  GERD (gastroesophageal reflux disease) 08/23/2017  . Fibromyalgia 03/14/2015  . Degenerative disc disease, cervical 03/14/2015  . Primary osteoarthritis  involving multiple joints 03/14/2015  . Depression 02/18/2014  . Asthma   . PAF (paroxysmal atrial fibrillation) ([place]) 11/28/2010  . Obesity 11/28/2010    Past Medical History:  Diagnosis Date  . Anxiety   . Asthma   . Atrial fibrillation ([place]) 11/2010   paroxysmal  . Bradycardia   . Depression   . DJD (degenerative joint disease)    Cervical spine  . Fibromyalgia    s/p rheumatology consultation/Zeminsky  . Mild sleep apnea   . Obesity   . Osteoarthritis    Shoulders, hips,knees    Family History  Problem Relation Age of Onset  . Emphysema Mother   . COPD Mother   . Anxiety disorder Mother   . Cancer Father        lung cancer; non-smoker  . Heart disease Father 65       AMI  . COPD Sister   . Emphysema Brother   . Cancer Brother   . Cancer Daughter   . Cancer Sister        lung cancer  . Heart disease Sister        AMI  . Diabetes Other   . Thyroid disease Other    Past Surgical History:  Procedure Laterality Date  . ABLATION  08/2012   PVI by Dr [name]  . ABLATION  11/13/2013   PVI by Dr [name]  . ATRIAL FIBRILLATION ABLATION N/A 09/03/2012   Procedure: ATRIAL FIBRILLATION ABLATION;  Surgeon: Thompson Grayer, MD;  Location: [facility] CATH LAB;  Service: Cardiovascular;  Laterality: N/A;  . ATRIAL FIBRILLATION ABLATION N/A 11/13/2013   Procedure: ATRIAL FIBRILLATION ABLATION;  Surgeon: Coralyn Mark, MD;  Location: [place] CATH LAB;  Service: Cardiovascular;  Laterality: N/A;  . CARDIOVERSION  03/13/2012   Procedure: CARDIOVERSION;  Surgeon: [name], MD;  Location: [facility] ENDOSCOPY;  Service: Cardiovascular;  Laterality: N/A;  . CARDIOVERSION N/A 07/26/2012   Procedure: CARDIOVERSION;  Surgeon: Thompson Grayer, MD;  Location: [place];  Service: Cardiovascular;  Laterality: N/A;  . CARDIOVERSION N/A 02/20/2014   Procedure: CARDIOVERSION;  Surgeon: Larey Dresser, MD;  Location: [place];  Service: Cardiovascular;  Laterality: N/A;  . CESAREAN SECTION    . CHOLECYSTECTOMY    .  HERNIA REPAIR    . KNEE SURGERY    . neck tumor resection     benign  . TEE WITHOUT CARDIOVERSION  03/13/2012   Procedure: TRANSESOPHAGEAL ECHOCARDIOGRAM (TEE);  Surgeon: [name], MD;  Location: [place];  Service: Cardiovascular;  Laterality: N/A;  Rm 2029  . TEE WITHOUT CARDIOVERSION N/A 09/02/2012   Procedure: TRANSESOPHAGEAL ECHOCARDIOGRAM (TEE);  Surgeon: Josue Hector, MD;  Location: [place];  Service: Cardiovascular;  Laterality: N/A;  . TEE WITHOUT CARDIOVERSION N/A 11/13/2013   Procedure: TRANSESOPHAGEAL ECHOCARDIOGRAM (TEE);  Surgeon: [name], MD;  Location: [facility] ENDOSCOPY;  Service: Cardiovascular;  Laterality: N/A;   Social History   Social History Narrative   Marital status: widowed since 01/2014 of melanoma; not dating.       Children:  3 children (17,30, 32) and 4 grandchildren and 1 gg. 1 adopted child.       Lives with son.  Lives in [place].        Employment:  Unemployed.       Tobacco: none      Alcohol: none  Drugs: none      Exercise: none   Immunization History  Administered Date(s) Administered  . Fluad Quad(high Dose 65+) 03/11/2019, 04/08/2020  . Influenza,inj,Quad PF,6+ Mos 03/09/2013, 05/26/2014, 03/14/2015  . Influenza,inj,quad, With Preservative 08/27/2017  . PFIZER(Purple Top)SARS-COV-2 Vaccination 08/26/2019, 09/19/2019  . Pneumococcal Polysaccharide-23 06/25/2015  . Tdap 03/14/2015     Objective: Vital Signs: BP 121/83 (BP Location: Right Arm, Patient Position: Sitting, Cuff Size: Normal)   Pulse (!) 101   Resp 16   Ht 5\' 1"  (1.549 m)   Wt 214 lb (97.1 kg)   BMI 40.43 kg/m    Physical Exam Constitutional:      Appearance: She is obese.  Eyes:     Conjunctiva/sclera: Conjunctivae normal.  Cardiovascular:     Rate and Rhythm: Normal rate and regular rhythm.  Pulmonary:     Effort: Pulmonary effort is normal.     Breath sounds: Normal breath sounds.  Skin:    General: Skin is warm and dry.     Findings: No  rash.  Neurological:     General: No focal deficit present.     Mental Status: She is alert.  Psychiatric:        Mood and Affect: Mood normal.     Musculoskeletal Exam:  Neck full ROM no tenderness Shoulders full ROM, right sided pain with abduction no focal tenderness or swelling Elbows full ROM no tenderness or swelling Wrists full ROM no tenderness or swelling Fingers full ROM no swelling, probable early bony enlargement of thumb IP joints and right 3rd digit Knees full ROM, right side crepitus and mildly painful patellar grind no swelling, left side pain with resisted flexion no swelling no tenderness Ankles full ROM no tenderness or swelling MTPs full ROM no tenderness or swelling   Investigation: No additional findings.  Imaging: No results found.  Recent Labs: Lab Results  Component Value Date   WBC 5.4 07/21/2020   HGB 12.5 07/21/2020   PLT 302 07/21/2020   NA 140 07/21/2020   K 4.1 07/21/2020   CL 105 07/21/2020   CO2 24 07/21/2020   GLUCOSE 89 07/21/2020   BUN 10 07/21/2020   CREATININE 0.80 07/21/2020   BILITOT 0.4 07/21/2019   ALKPHOS 130 (H) 07/21/2019   AST 27 07/21/2019   ALT 29 07/21/2019   PROT 6.4 (L) 07/21/2019   ALBUMIN 3.8 07/21/2019   CALCIUM 9.0 07/21/2020   GFRAA >60 07/21/2019    Speciality Comments: No specialty comments available.  Procedures:  Large Joint Inj: R glenohumeral on 08/02/2020 9:28 AM Indications: pain Details: 25 G 1.5 in needle, posterior approach Medications: 3 mL lidocaine 1 %; 40 mg triamcinolone acetonide 40 MG/ML Outcome: tolerated well, no immediate complications Consent was given by the patient. Immediately prior to procedure a time out was called to verify the correct patient, procedure, equipment, support staff and site/side marked as required. Patient was prepped and draped in the usual sterile fashion.   Large Joint Inj: L knee on 08/02/2020 9:29 AM Indications: pain Details: 25 G 1.5 in needle,  anteromedial approach Medications: 3 mL lidocaine 1 %; 40 mg triamcinolone acetonide 40 MG/ML Consent was given by the patient. Immediately prior to procedure a time out was called to verify the correct patient, procedure, equipment, support staff and site/side marked as required. Patient was prepped and draped in the usual sterile fashion.     Allergies: Adhesive [tape]   Assessment / Plan:     Visit Diagnoses: Primary osteoarthritis involving  multiple joints  Smoker's and has primary osteoarthritis of multiple joints there is left knee crepitus review of x-ray shows primarily patellofemoral compartment joint space narrowing.  Functional status is good no loss of strength in hip and proximal leg muscles.  Right shoulder symptoms appear more consistent with rotator cuff pathology with a fully preserved passive range of motion. She is already on maximal amount of NSAIDs discussed additional treatments including exercises and physical therapy, soft knee bracing for support, and injection treatment with cortisone as appropriate at this time.  Elect to treat with steroid injection of right shoulder and left knee today and will follow up in a few months for response to treatment.  Procedure was well-tolerated and call back instructions provided.  Fibromyalgia  She has had more life stress and intermittent depressed mood related to the death of a son within the past 2 years.  Discussed relationship of chronic pain and stress and mood recommended reviewing online materials with [employer].  If is getting worse or no improvement could consider trial SNRI treatment.  PAF (paroxysmal atrial fibrillation) (HCC)  Paroxysmal A. fib had recent cardioversion although rate is somewhat elevated again today.  Discussed associated risks with Xarelto for procedure and possible effect on blood pressure and heart rate with the systemic steroid exposure.  Appears well compensated at this  time. May have increased risk of complications with the continued nonselective NSAID use on Xarelto and with GERD as well but tolerating currently.  Orders: No orders of the defined types were placed in this encounter.  No orders of the defined types were placed in this encounter.   Follow-Up Instructions: Return in about 3 months (around 10/30/2020) for OA f/u injections and ?FMS.   Collier Salina, MD  Note - This record has been created using [employer].  Chart creation errors have been sought, but may not always  have been located. Such creation errors do not reflect on  the standard of medical care.

## 2020-08-02 ENCOUNTER — Encounter: Payer: Self-pay | Admitting: Internal Medicine

## 2020-08-02 ENCOUNTER — Ambulatory Visit: Payer: Medicare Other | Admitting: Internal Medicine

## 2020-08-02 ENCOUNTER — Other Ambulatory Visit: Payer: Self-pay

## 2020-08-02 VITALS — BP 121/83 | HR 101 | Resp 16 | Ht 61.0 in | Wt 214.0 lb

## 2020-08-02 DIAGNOSIS — M159 Polyosteoarthritis, unspecified: Secondary | ICD-10-CM

## 2020-08-02 DIAGNOSIS — M797 Fibromyalgia: Secondary | ICD-10-CM

## 2020-08-02 DIAGNOSIS — M25562 Pain in left knee: Secondary | ICD-10-CM | POA: Diagnosis not present

## 2020-08-02 DIAGNOSIS — I480 Paroxysmal atrial fibrillation: Secondary | ICD-10-CM | POA: Diagnosis not present

## 2020-08-02 DIAGNOSIS — M25511 Pain in right shoulder: Secondary | ICD-10-CM

## 2020-08-02 DIAGNOSIS — M8949 Other hypertrophic osteoarthropathy, multiple sites: Secondary | ICD-10-CM | POA: Diagnosis not present

## 2020-08-02 MED ORDER — TRIAMCINOLONE ACETONIDE 40 MG/ML IJ SUSP
40.0000 mg | INTRAMUSCULAR | Status: AC | PRN
Start: 2020-08-02 — End: 2020-08-02
  Administered 2020-08-02: 40 mg via INTRA_ARTICULAR

## 2020-08-02 MED ORDER — TRIAMCINOLONE ACETONIDE 40 MG/ML IJ SUSP
40.0000 mg | INTRAMUSCULAR | Status: AC | PRN
  Administered 2020-08-02: 40 mg via INTRA_ARTICULAR

## 2020-08-02 MED ORDER — LIDOCAINE HCL 1 % IJ SOLN
3.0000 mL | INTRAMUSCULAR | Status: AC | PRN
Start: 2020-08-02 — End: 2020-08-02
  Administered 2020-08-02: 3 mL

## 2020-08-02 NOTE — Patient Instructions (Signed)
I recommend checking out the [place] patient-centered guide for fibromyalgia and chronic pain management: [email or web address]    Osteoarthritis  Osteoarthritis is a type of arthritis. It refers to joint pain or joint disease. Osteoarthritis affects tissue that covers the ends of bones in joints (cartilage). Cartilage acts as a cushion between the bones and helps them move smoothly. Osteoarthritis occurs when cartilage in the joints gets worn down. Osteoarthritis is sometimes called "wear and tear" arthritis. Osteoarthritis is the most common form of arthritis. It often occurs in older people. It is a condition that gets worse over time. The joints most often affected by this condition are in the fingers, toes, hips, knees, and spine, including the neck and lower back. What are the causes? This condition is caused by the wearing down of cartilage that covers the ends of bones. What increases the risk? The following factors may make you more likely to develop this condition:  Being age 48 or older.  Obesity.  Overuse of joints.  Past injury of a joint.  Past surgery on a joint.  Family history of osteoarthritis. What are the signs or symptoms? The main symptoms of this condition are pain, swelling, and stiffness in the joint. Other symptoms may include:  An enlarged joint.  More pain and further damage caused by small pieces of bone or cartilage that break off and float inside of the joint.  Small deposits of bone (osteophytes) that grow on the edges of the joint.  A grating or scraping feeling inside the joint when you move it.  Popping or creaking sounds when you move.  Difficulty walking or exercising.  An inability to grip items, twist your hand(s), or control the movements of your hands and fingers. How is this diagnosed? This condition may be diagnosed based on:  Your medical history.  A physical exam.  Your symptoms.  X-rays of the affected  joint(s).  Blood tests to rule out other types of arthritis. How is this treated? There is no cure for this condition, but treatment can help control pain and improve joint function. Treatment may include a combination of therapies, such as:  Pain relief techniques, such as: ? Applying heat and cold to the joint. ? Massage. ? A form of talk therapy called cognitive behavioral therapy (CBT). This therapy helps you set goals and follow up on the changes that you make.  Medicines for pain and inflammation. The medicines can be taken by mouth or applied to the skin. They include: ? NSAIDs, such as ibuprofen. ? Prescription medicines. ? Strong anti-inflammatory medicines (corticosteroids). ? Certain nutritional supplements.  A prescribed exercise program. You may work with a physical therapist.  Assistive devices, such as a brace, wrap, splint, specialized glove, or cane.  A weight control plan.  Surgery, such as: ? An osteotomy. This is done to reposition the bones and relieve pain or to remove loose pieces of bone and cartilage. ? Joint replacement surgery. You may need this surgery if you have advanced osteoarthritis. Follow these instructions at home: Activity  Rest your affected joints as told by your health care provider.  Exercise as told by your health care provider. He or she may recommend specific types of exercise, such as: ? Strengthening exercises. These are done to strengthen the muscles that support joints affected by arthritis. ? Aerobic activities. These are exercises, such as brisk walking or water aerobics, that increase your heart rate. ? Range-of-motion activities. These help your joints move more easily. ?  Balance and agility exercises. Managing pain, stiffness, and swelling  If directed, apply heat to the affected area as often as told by your health care provider. Use the heat source that your health care provider recommends, such as a moist heat pack or a  heating pad. ? If you have a removable assistive device, remove it as told by your health care provider. ? Place a towel between your skin and the heat source. If your health care provider tells you to keep the assistive device on while you apply heat, place a towel between the assistive device and the heat source. ? Leave the heat on for 20-30 minutes. ? Remove the heat if your skin turns bright red. This is especially important if you are unable to feel pain, heat, or cold. You may have a greater risk of getting burned.  If directed, put ice on the affected area. To do this: ? If you have a removable assistive device, remove it as told by your health care provider. ? Put ice in a plastic bag. ? Place a towel between your skin and the bag. If your health care provider tells you to keep the assistive device on during icing, place a towel between the assistive device and the bag. ? Leave the ice on for 20 minutes, 2-3 times a day. ? Move your fingers or toes often to reduce stiffness and swelling. ? Raise (elevate) the injured area above the level of your heart while you are sitting or lying down.      General instructions  Take over-the-counter and prescription medicines only as told by your health care provider.  Maintain a healthy weight. Follow instructions from your health care provider for weight control.  Do not use any products that contain nicotine or tobacco, such as cigarettes, e-cigarettes, and chewing tobacco. If you need help quitting, ask your health care provider.  Use assistive devices as told by your health care provider.  Keep all follow-up visits as told by your health care provider. This is important. Where to find more information  [employer] of Arthritis and Musculoskeletal and Skin Diseases: www.niams.SouthExposed.es  [employer] on Aging: http://kim-miller.com/  American College of Rheumatology: www.rheumatology.org Contact a health care provider if:  You  have redness, swelling, or a feeling of warmth in a joint that gets worse.  You have a fever along with joint or muscle aches.  You develop a rash.  You have trouble doing your normal activities. Get help right away if:  You have pain that gets worse and is not relieved by pain medicine. Summary  Osteoarthritis is a type of arthritis that affects tissue covering the ends of bones in joints (cartilage).  This condition is caused by the wearing down of cartilage that covers the ends of bones.  The main symptom of this condition is pain, swelling, and stiffness in the joint.  There is no cure for this condition, but treatment can help control pain and improve joint function. This information is not intended to replace advice given to you by your health care provider. Make sure you discuss any questions you have with your health care provider. Document Revised: 05/26/2019 Document Reviewed: 05/26/2019 Elsevier Patient Education  2021 [employer] Steroid Injection A joint steroid injection is a procedure to relieve swelling and pain in a joint. Steroids are medicines that reduce inflammation. In this procedure, your health care provider uses a syringe and a needle to inject a steroid medicine into  a painful and inflamed joint. A pain-relieving medicine (anesthetic) may be injected along with the steroid. In some cases, your health care provider may use an imaging technique such as ultrasound or fluoroscopy to guide the injection. Joints that are often treated with steroid injections include the knee, shoulder, hip, and spine. These injections may also be used in the elbow, ankle, and joints of the hands or feet. You may have joint steroid injections as part of your treatment for inflammation caused by:  Gout.  Rheumatoid arthritis.  Advanced wear-and-tear arthritis (osteoarthritis).  Tendinitis.  Bursitis. Joint steroid injections may be repeated, but having them too  often can damage a joint or the skin over the joint. You should not have joint steroid injections less than 6 weeks apart or more than four times a year. Tell a health care provider about:  Any allergies you have.  All medicines you are taking, including vitamins, herbs, eye drops, creams, and over-the-counter medicines.  Any problems you or family members have had with anesthetic medicines.  Any blood disorders you have.  Any surgeries you have had.  Any medical conditions you have.  Whether you are pregnant or may be pregnant. What are the risks? Generally, this is a safe treatment. However, problems may occur, including:  Infection.  Bleeding.  Allergic reactions to medicines.  Damage to the joint or tissues around the joint.  Thinning of skin or loss of skin color over the joint.  Temporary flushing of the face or chest.  Temporary increase in pain.  Temporary increase in blood sugar.  Failure to relieve inflammation or pain. What happens before the treatment? Medicines Ask your health care provider about:  Changing or stopping your regular medicines. This is especially important if you are taking diabetes medicines or blood thinners.  Taking medicines such as aspirin and ibuprofen. These medicines can thin your blood. Do not take these medicines unless your health care provider tells you to take them.  Taking over-the-counter medicines, vitamins, herbs, and supplements. General instructions  You may have imaging tests of your joint.  Ask your health care provider if you can drive yourself home after the procedure. What happens during the treatment?  Your health care provider will position you for the injection and locate the injection site over your joint.  The skin over the joint will be cleaned with a germ-killing soap.  Your health care provider may: ? Spray a numbing solution (topical anesthetic) over the injection site. ? Inject a local anesthetic  under the skin above your joint.  The needle will be placed through your skin into your joint. Your health care provider may use imaging to guide the needle to the right spot for the injection. If imaging is used, a special contrast dye may be injected to confirm that the needle is in the correct location.  The steroid medicine will be injected into your joint.  Anesthetic may be injected along with the steroid. This may be a medicine that relieves pain for a short time (short-acting anesthetic) or for a longer time (long-acting anesthetic).  The needle will be removed, and an adhesive bandage (dressing) will be placed over the injection site. The procedure may vary among health care providers and hospitals.   What can I expect after the treatment?  You will be able to go home after the treatment.  It is normal to feel slight flushing for a few days after the injection.  After the treatment, it is common to have  an increase in joint pain after the anesthetic has worn off. This may happen about an hour after a short-acting anesthetic or about 8 hours after a longer-acting anesthetic.  You should begin to feel relief from joint pain and swelling after 24 to 48 hours. Contact your health care provider if you do not begin to feel relief after 2 days. Follow these instructions at home: Injection site care  Leave the adhesive dressing over your injection site in place until your health care provider says you can remove it.  Check your injection site every day for signs of infection. Check for: ? More redness, swelling, or pain. ? Fluid or blood. ? Warmth. ? Pus or a bad smell. Activity  Return to your normal activities as told by your health care provider. Ask your health care provider what activities are safe for you. You may be asked to limit activities that put stress on the joint for a few days.  Do joint exercises as told by your health care provider.  Do not take baths, swim, or  use a hot tub until your health care provider approves. Ask your health care provider if you may take showers. You may only be allowed to take sponge baths. Managing pain, stiffness, and swelling  If directed, put ice on the joint. To do this: ? Put ice in a plastic bag. ? Place a towel between your skin and the bag. ? Leave the ice on for 20 minutes, 2-3 times a day. ? Remove the ice if your skin turns bright red. This is very important. If you cannot feel pain, heat, or cold, you have a greater risk of damage to the area.  Raise (elevate) your joint above the level of your heart when you are sitting or lying down.   General instructions  Take over-the-counter and prescription medicines only as told by your health care provider.  Do not use any products that contain nicotine or tobacco, such as cigarettes, e-cigarettes, and chewing tobacco. These can delay joint healing. If you need help quitting, ask your health care provider.  If you have diabetes, be aware that your blood sugar may be slightly elevated for several days after the injection.  Keep all follow-up visits. This is important. Contact a health care provider if you have:  Chills or a fever.  Any signs of infection at your injection site.  Increased pain or swelling or no relief after 2 days. Summary  A joint steroid injection is a treatment to relieve pain and swelling in a joint.  Steroids are medicines that reduce inflammation. Your health care provider may add an anesthetic along with the steroid.  You may have joint steroid injections as part of your arthritis treatment.  Joint steroid injections may be repeated, but having them too often can damage a joint or the skin over the joint.  Contact your health care provider if you have a fever, chills, or signs of infection, or if you get no relief from joint pain or swelling.

## 2020-09-26 ENCOUNTER — Other Ambulatory Visit (HOSPITAL_COMMUNITY): Payer: Self-pay | Admitting: Nurse Practitioner

## 2020-10-06 ENCOUNTER — Other Ambulatory Visit: Payer: Self-pay

## 2020-10-07 ENCOUNTER — Ambulatory Visit (INDEPENDENT_AMBULATORY_CARE_PROVIDER_SITE_OTHER): Payer: Medicare HMO | Admitting: Emergency Medicine

## 2020-10-07 ENCOUNTER — Encounter: Payer: Self-pay | Admitting: Emergency Medicine

## 2020-10-07 ENCOUNTER — Ambulatory Visit: Payer: Self-pay | Admitting: Emergency Medicine

## 2020-10-07 VITALS — BP 130/82 | HR 79 | Temp 98.5°F | Ht 61.0 in | Wt 209.0 lb

## 2020-10-07 DIAGNOSIS — M8949 Other hypertrophic osteoarthropathy, multiple sites: Secondary | ICD-10-CM

## 2020-10-07 DIAGNOSIS — K219 Gastro-esophageal reflux disease without esophagitis: Secondary | ICD-10-CM

## 2020-10-07 DIAGNOSIS — M797 Fibromyalgia: Secondary | ICD-10-CM | POA: Diagnosis not present

## 2020-10-07 DIAGNOSIS — Z13820 Encounter for screening for osteoporosis: Secondary | ICD-10-CM | POA: Diagnosis not present

## 2020-10-07 DIAGNOSIS — I480 Paroxysmal atrial fibrillation: Secondary | ICD-10-CM

## 2020-10-07 DIAGNOSIS — Z23 Encounter for immunization: Secondary | ICD-10-CM | POA: Diagnosis not present

## 2020-10-07 DIAGNOSIS — M069 Rheumatoid arthritis, unspecified: Secondary | ICD-10-CM | POA: Diagnosis not present

## 2020-10-07 DIAGNOSIS — M159 Polyosteoarthritis, unspecified: Secondary | ICD-10-CM

## 2020-10-07 MED ORDER — DICLOFENAC-MISOPROSTOL 75-0.2 MG PO TBEC: 1.0000 | DELAYED_RELEASE_TABLET | Freq: Two times a day (BID) | ORAL | 3 refills | Status: DC

## 2020-10-07 NOTE — Patient Instructions (Signed)
Health Maintenance After Age 67 After age 67, you are at a higher risk for certain long-term diseases and infections as well as injuries from falls. Falls are a major cause of broken bones and head injuries in people who are older than age 67. Getting regular preventive care can help to keep you healthy and well. Preventive care includes getting regular testing and making lifestyle changes as recommended by your health care provider. Talk with your health care provider about:  Which screenings and tests you should have. A screening is a test that checks for a disease when you have no symptoms.  A diet and exercise plan that is right for you. What should I know about screenings and tests to prevent falls? Screening and testing are the best ways to find a health problem early. Early diagnosis and treatment give you the best chance of managing medical conditions that are common after age 67. Certain conditions and lifestyle choices may make you more likely to have a fall. Your health care provider may recommend:  Regular vision checks. Poor vision and conditions such as cataracts can make you more likely to have a fall. If you wear glasses, make sure to get your prescription updated if your vision changes.  Medicine review. Work with your health care provider to regularly review all of the medicines you are taking, including over-the-counter medicines. Ask your health care provider about any side effects that may make you more likely to have a fall. Tell your health care provider if any medicines that you take make you feel dizzy or sleepy.  Osteoporosis screening. Osteoporosis is a condition that causes the bones to get weaker. This can make the bones weak and cause them to break more easily.  Blood pressure screening. Blood pressure changes and medicines to control blood pressure can make you feel dizzy.  Strength and balance checks. Your health care provider may recommend certain tests to check your  strength and balance while standing, walking, or changing positions.  Foot health exam. Foot pain and numbness, as well as not wearing proper footwear, can make you more likely to have a fall.  Depression screening. You may be more likely to have a fall if you have a fear of falling, feel emotionally low, or feel unable to do activities that you used to do.  Alcohol use screening. Using too much alcohol can affect your balance and may make you more likely to have a fall. What actions can I take to lower my risk of falls? General instructions  Talk with your health care provider about your risks for falling. Tell your health care provider if: ? You fall. Be sure to tell your health care provider about all falls, even ones that seem minor. ? You feel dizzy, sleepy, or off-balance.  Take over-the-counter and prescription medicines only as told by your health care provider. These include any supplements.  Eat a healthy diet and maintain a healthy weight. A healthy diet includes low-fat dairy products, low-fat (lean) meats, and fiber from whole grains, beans, and lots of fruits and vegetables. Home safety  Remove any tripping hazards, such as rugs, cords, and clutter.  Install safety equipment such as grab bars in bathrooms and safety rails on stairs.  Keep rooms and walkways well-lit. Activity  Follow a regular exercise program to stay fit. This will help you maintain your balance. Ask your health care provider what types of exercise are appropriate for you.  If you need a cane or walker,   use it as recommended by your health care provider.  Wear supportive shoes that have nonskid soles.   Lifestyle  Do not drink alcohol if your health care provider tells you not to drink.  If you drink alcohol, limit how much you have: ? 0-1 drink a day for women. ? 0-2 drinks a day for men.  Be aware of how much alcohol is in your drink. In the [place] one drink equals one typical bottle of beer (12  oz), one-half glass of wine (5 oz), or one shot of hard liquor (1 oz).  Do not use any products that contain nicotine or tobacco, such as cigarettes and e-cigarettes. If you need help quitting, ask your health care provider. Summary  Having a healthy lifestyle and getting preventive care can help to protect your health and wellness after age 67.  Screening and testing are the best way to find a health problem early and help you avoid having a fall. Early diagnosis and treatment give you the best chance for managing medical conditions that are more common for people who are older than age 67.  Falls are a major cause of broken bones and head injuries in people who are older than age 67. Take precautions to prevent a fall at home.  Work with your health care provider to learn what changes you can make to improve your health and wellness and to prevent falls. This information is not intended to replace advice given to you by your health care provider. Make sure you discuss any questions you have with your health care provider. Document Revised: 09/19/2018 Document Reviewed: 04/11/2017 Elsevier Patient Education  2021 [employer] Inc.  

## 2020-10-07 NOTE — Assessment & Plan Note (Signed)
Chronic atrial fibrillation on long-term anticoagulation. Controlled ventricular rate and normotensive. Stable.  No concerns.

## 2020-10-07 NOTE — Assessment & Plan Note (Signed)
Stable with recent rheumatology office visit. Presently on diclofenac-misoprostol twice a day as recommended by rheumatologist. Patient advised about risk of upper GI bleed and precautions to be taken.

## 2020-10-07 NOTE — Progress Notes (Signed)
Angela Cummings 65 y.o.   Chief Complaint  Patient presents with  . chronic medical problem    Follow up 6 months. Patient wants Dexa Scan, while triaging patient [facility] called to schedule her mammogram.    HISTORY OF PRESENT ILLNESS: This is a 65 y.o. female here for follow-up of multiple chronic medical problems. 1.  Chronic atrial fibrillation on long-term anticoagulation with Xarelto 20 mg daily. Presently on diltiazem ER 240 mg daily I am metoprolol succinate 12.5 mg daily for rate control. Goes to atrial fibrillation clinic on a regular basis.  Recent labs within normal limits with normal potassium.  Patient takes potassium supplement 40 mEq daily 2.  History of rheumatoid arthritis.  Started on diclofenac-misoprostol twice a day by rheumatologist. 3.  History of asthma with very infrequent episodes.  Does not use albuterol inhaler regularly. 4.  History of GERD  Today feels well.  Has no complaints or medical concerns. Recent rheumatology visit on 08/02/2020 with Dr. Benjamine Mola as follows:  Assessment / Plan:     Visit Diagnoses: Primary osteoarthritis involving multiple joints   Smoker's and has primary osteoarthritis of multiple joints there is left knee crepitus review of x-ray shows primarily patellofemoral compartment joint space narrowing.  Functional status is good no loss of strength in hip and proximal leg muscles.  Right shoulder symptoms appear more consistent with rotator cuff pathology with a fully preserved passive range of motion. She is already on maximal amount of NSAIDs discussed additional treatments including exercises and physical therapy, soft knee bracing for support, and injection treatment with cortisone as appropriate at this time.  Elect to treat with steroid injection of right shoulder and left knee today and will follow up in a few months for response to treatment.  Procedure was well-tolerated and call back instructions provided.   Fibromyalgia    She has had more life stress and intermittent depressed mood related to the death of a son within the past 2 years.  Discussed relationship of chronic pain and stress and mood recommended reviewing online materials with [employer].  If is getting worse or no improvement could consider trial SNRI treatment.   PAF (paroxysmal atrial fibrillation) (HCC)   Paroxysmal A. fib had recent cardioversion although rate is somewhat elevated again today.  Discussed associated risks with Xarelto for procedure and possible effect on blood pressure and heart rate with the systemic steroid exposure.  Appears well compensated at this time. May have increased risk of complications with the continued nonselective NSAID use on Xarelto and with GERD as well but tolerating currently.   Most recent cardiology visit on 07/21/2020 as follows: Assessment and Plan: 1. Afib  In S.tach at 120 bpm with PAC's  Did not take BB this am  Previously  treated with Tikosyn and amiodarone  Continue metoprolol succinate 12.5 mg daily   Continue Cardizem 240 mg daily  She has previously not wanted to go down repeat ablation path   2.CHA2DS2VASc score is 2 Continue Xarelto 20 mg a day Cbc/bmet today    F/u one week for EKG to make sure in SR with controlled rates    Butch Penny C. [facility] [address] [place], [place] [address] [phone]     HPI   Prior to Admission medications   Medication Sig Start Date End Date Taking? Authorizing Provider  acetaminophen (TYLENOL) 500 MG tablet Take by mouth as needed.    Yes [provider]  KLOR-CON M20 20 MEQ tablet TAKE 2 TABLETS (40 MEQ TOTAL) 2 (TWO) TIMES DAILY BY MOUTH. 11/24/19  Yes Sherran Needs, NP  LORazepam (ATIVAN) 1 MG tablet Take 1 mg by mouth as needed. 01/14/19  Yes [provider]  metoprolol succinate (TOPROL XL) 25 MG 24 hr tablet Take 0.5 tablets (12.5 mg total) by mouth daily.  01/20/20 01/19/21 Yes Sherran Needs, NP  pantoprazole (PROTONIX) 40 MG tablet TAKE 1 TABLET BY MOUTH TWICE A DAY 08/01/19  Yes Horald Pollen, MD  PROVENTIL HFA 108 [phone] Base) MCG/ACT inhaler INHALE 2 PUFFS INTO THE LUNGS EVERY 6 HOURS AS NEEDED 02/16/16  Yes Wardell Honour, MD  TIADYLT ER 240 MG 24 hr capsule TAKE 1 CAPSULE BY MOUTH EVERY DAY 09/27/20  Yes Sherran Needs, NP  XARELTO 20 MG TABS tablet TAKE 1 TABLET EVERY DAY WITH SUPPER 10/27/19  Yes Sherran Needs, NP  Diclofenac-miSOPROStol 75-0.2 MG TBEC Take 1 tablet by mouth 2 (two) times daily. 10/07/20   Horald Pollen, MD    Allergies  Allergen Reactions  . Adhesive [Tape] Itching and Rash    Paper tape is better but best to use no adhesive if possible    Patient Active Problem List   Diagnosis Date Noted  . Osteoarthritis of left foot 10/06/2017  . GERD (gastroesophageal reflux disease) 08/23/2017  . Fibromyalgia 03/14/2015  . Degenerative disc disease, cervical 03/14/2015  . Primary osteoarthritis involving multiple joints 03/14/2015  . Depression 02/18/2014  . Asthma   . PAF (paroxysmal atrial fibrillation) ([place]) 11/28/2010  . Obesity 11/28/2010    Past Medical History:  Diagnosis Date  . Anxiety   . Asthma   . Atrial fibrillation ([place]) 11/2010   paroxysmal  . Bradycardia   . Depression   . DJD (degenerative joint disease)    Cervical spine  . Fibromyalgia    s/p rheumatology consultation/Zeminsky  . Mild sleep apnea   . Obesity   . Osteoarthritis    Shoulders, hips,knees    Past Surgical History:  Procedure Laterality Date  . ABLATION  08/2012   PVI by Dr [name]  . ABLATION  11/13/2013   PVI by Dr [name]  . ATRIAL FIBRILLATION ABLATION N/A 09/03/2012   Procedure: ATRIAL FIBRILLATION ABLATION;  Surgeon: Thompson Grayer, MD;  Location[facility] C[facility] LAB;  Service: Cardiovascular;  Laterality: N/A;  . ATRIAL FIBRILLATION ABLATION N/A 11/13/2013   Procedure: ATRIAL FIBRILLATION ABLATION;  Surgeon: Coralyn Mark, MD;  Location: [place] CATH LAB;  Service: Cardiovascular;  Laterality: N/A;  . CARDIOVERSION  03/13/2012   Procedure: CARDIOVERSION;  Surgeon: [name], MD;  Location: [facility] ENDOSCOPY;  Service: Cardiovascular;  Laterality: N/A;  . CARDIOVERSION N/A 07/26/2012   Procedure: CARDIOVERSION;  Surgeon: Thompson Grayer, MD;  Location: [place];  Service: Cardiovascular;  Laterality: N/A;  . CARDIOVERSION N/A 02/20/2014   Procedure: CARDIOVERSION;  Surgeon: Larey Dresser, MD;  Location: [place];  Service: Cardiovascular;  Laterality: N/A;  . CESAREAN SECTION    . CHOLECYSTECTOMY    . HERNIA REPAIR    . KNEE SURGERY    . neck tumor resection     benign  . TEE WITHOUT CARDIOVERSION  03/13/2012   Procedure: TRANSESOPHAGEAL ECHOCARDIOGRAM (TEE);  Surgeon: [name], MD;  Location: [place];  Service: Cardiovascular;  Laterality: N/A;  Rm 2029  . TEE WITHOUT CARDIOVERSION N/A 09/02/2012   Procedure: TRANSESOPHAGEAL ECHOCARDIOGRAM (TEE);  Surgeon: Josue Hector, MD;  Location: [facility] ENDOSCOPY;  Service: Cardiovascular;  Laterality: N/A;  . TEE WITHOUT CARDIOVERSION N/A 11/13/2013   Procedure: TRANSESOPHAGEAL ECHOCARDIOGRAM (TEE);  Surgeon: [name], MD;  Location: [facility] ENDOSCOPY;  Service: Cardiovascular;  Laterality: N/A;    Social History   Socioeconomic History  . Marital status: Widowed    Spouse name: Not on file  . Number of children: 3  . Years of education: Not on file  . Highest education level: Not on file  Occupational History  . Not on file  Tobacco Use  . Smoking status: Never Smoker  . Smokeless tobacco: Never Used  Vaping Use  . Vaping Use: Never used  Substance and Sexual Activity  . Alcohol use: Yes    Comment: Rarely- once every few months  . Drug use: No  . Sexual activity: Never  Other Topics Concern  . Not on file  Social History Narrative   Marital status: widowed since 01/2014 of melanoma; not dating.       Children:  3 children (17,30, 32) and 4  grandchildren and 1 gg. 1 adopted child.       Lives with son.  Lives in [place].        Employment:  Unemployed.       Tobacco: none      Alcohol: none      Drugs: none      Exercise: none   Social Determinants of Health   Financial Resource Strain: Not on file  Food Insecurity: Not on file  Transportation Needs: Not on file  Physical Activity: Not on file  Stress: Not on file  Social Connections: Not on file  Intimate Partner Violence: Not on file    Family History  Problem Relation Age of Onset  . Emphysema Mother   . COPD Mother   . Anxiety disorder Mother   . Cancer Father        lung cancer; non-smoker  . Heart disease Father 42       AMI  . COPD Sister   . Emphysema Brother   . Cancer Brother   . Cancer Daughter   . Cancer Sister        lung cancer  . Heart disease Sister        AMI  . Diabetes Other   . Thyroid disease Other      Review of Systems  Constitutional: Negative.  Negative for chills and fever.  HENT: Negative.  Negative for congestion and sore throat.   Respiratory: Negative.  Negative for cough and shortness of breath.   Cardiovascular: Positive for palpitations. Negative for chest pain and leg swelling.  Gastrointestinal: Negative for abdominal pain, diarrhea, nausea and vomiting.  Genitourinary: Negative.  Negative for dysuria and hematuria.  Skin: Negative.  Negative for rash.  Neurological: Negative.  Negative for dizziness and headaches.  All other systems reviewed and are negative.  Today's Vitals   10/07/20 1029  BP: 130/82  Pulse: 79  Temp: 98.5 F (36.9 C)  TempSrc: Oral  SpO2: 98%  Weight: 209 lb (94.8 kg)  Height: 5\' 1"  (1.549 m)   Body mass index is 39.49 kg/m. Wt Readings from Last 3 Encounters:  10/07/20 209 lb (94.8 kg)  08/02/20 214 lb (97.1 kg)  07/21/20 218 lb 3.2 oz (99 kg)     Physical Exam Vitals reviewed.  Constitutional:      Appearance: Normal appearance.  HENT:     Head: Normocephalic.   Eyes:  Extraocular Movements: Extraocular movements intact.     Conjunctiva/sclera: Conjunctivae normal.     Pupils: Pupils are equal, round, and reactive to light.  Cardiovascular:     Rate and Rhythm: Normal rate. Rhythm irregular.     Pulses: Normal pulses.     Heart sounds: Normal heart sounds.  Pulmonary:     Effort: Pulmonary effort is normal.     Breath sounds: Normal breath sounds.  Musculoskeletal:        General: Normal range of motion.     Cervical back: Normal range of motion and neck supple.  Skin:    General: Skin is warm and dry.     Capillary Refill: Capillary refill takes less than 2 seconds.  Neurological:     General: No focal deficit present.     Mental Status: She is alert and oriented to person, place, and time.  Psychiatric:        Mood and Affect: Mood normal.        Behavior: Behavior normal.      ASSESSMENT & PLAN: A total of 30 minutes was spent with the patient and counseling/coordination of care regarding chronic medical problems and cardiovascular risks, review of all medications, review of most recent office visits with rheumatologist and cardiologist, review of most recent office visit with me, review of most recent blood work results, long-term anticoagulation precautions and fall precautions, risks of bleeding with NSAIDs, education on nutrition, health maintenance items including need for mammogram and colonoscopy, prognosis, documentation, need for follow-up.  PAF (paroxysmal atrial fibrillation) (HCC) Chronic atrial fibrillation on long-term anticoagulation. Controlled ventricular rate and normotensive. Stable.  No concerns.  Primary osteoarthritis involving multiple joints Stable with recent rheumatology office visit. Presently on diclofenac-misoprostol twice a day as recommended by rheumatologist. Patient advised about risk of upper GI bleed and precautions to be taken.  [place] was seen today for chronic medical problem.  Diagnoses  and all orders for this visit:  Primary osteoarthritis involving multiple joints -     Diclofenac-miSOPROStol 75-0.2 MG TBEC; Take 1 tablet by mouth 2 (two) times daily.  PAF (paroxysmal atrial fibrillation) (HCC)  Fibromyalgia  Gastroesophageal reflux disease without esophagitis  Osteoporosis screening -     Cancel: HM DEXA SCAN -     DG BONE DENSITY (DXA)  Need for prophylactic vaccination against Streptococcus pneumoniae (pneumococcus) -     Pneumococcal polysaccharide vaccine 23-valent greater than or equal to 2yo subcutaneous/IM    Patient Instructions   Health Maintenance After Age 2 After age 22, you are at a higher risk for certain long-term diseases and infections as well as injuries from falls. Falls are a major cause of broken bones and head injuries in people who are older than age 71. Getting regular preventive care can help to keep you healthy and well. Preventive care includes getting regular testing and making lifestyle changes as recommended by your health care provider. Talk with your health care provider about:  Which screenings and tests you should have. A screening is a test that checks for a disease when you have no symptoms.  A diet and exercise plan that is right for you. What should I know about screenings and tests to prevent falls? Screening and testing are the best ways to find a health problem early. Early diagnosis and treatment give you the best chance of managing medical conditions that are common after age 83. Certain conditions and lifestyle choices may make you more likely to have a fall. Your  health care provider may recommend:  Regular vision checks. Poor vision and conditions such as cataracts can make you more likely to have a fall. If you wear glasses, make sure to get your prescription updated if your vision changes.  Medicine review. Work with your health care provider to regularly review all of the medicines you are taking, including  over-the-counter medicines. Ask your health care provider about any side effects that may make you more likely to have a fall. Tell your health care provider if any medicines that you take make you feel dizzy or sleepy.  Osteoporosis screening. Osteoporosis is a condition that causes the bones to get weaker. This can make the bones weak and cause them to break more easily.  Blood pressure screening. Blood pressure changes and medicines to control blood pressure can make you feel dizzy.  Strength and balance checks. Your health care provider may recommend certain tests to check your strength and balance while standing, walking, or changing positions.  Foot health exam. Foot pain and numbness, as well as not wearing proper footwear, can make you more likely to have a fall.  Depression screening. You may be more likely to have a fall if you have a fear of falling, feel emotionally low, or feel unable to do activities that you used to do.  Alcohol use screening. Using too much alcohol can affect your balance and may make you more likely to have a fall. What actions can I take to lower my risk of falls? General instructions  Talk with your health care provider about your risks for falling. Tell your health care provider if: ? You fall. Be sure to tell your health care provider about all falls, even ones that seem minor. ? You feel dizzy, sleepy, or off-balance.  Take over-the-counter and prescription medicines only as told by your health care provider. These include any supplements.  Eat a healthy diet and maintain a healthy weight. A healthy diet includes low-fat dairy products, low-fat (lean) meats, and fiber from whole grains, beans, and lots of fruits and vegetables. Home safety  Remove any tripping hazards, such as rugs, cords, and clutter.  Install safety equipment such as grab bars in bathrooms and safety rails on stairs.  Keep rooms and walkways well-lit. Activity  Follow a regular  exercise program to stay fit. This will help you maintain your balance. Ask your health care provider what types of exercise are appropriate for you.  If you need a cane or walker, use it as recommended by your health care provider.  Wear supportive shoes that have nonskid soles.   Lifestyle  Do not drink alcohol if your health care provider tells you not to drink.  If you drink alcohol, limit how much you have: ? 0-1 drink a day for women. ? 0-2 drinks a day for men.  Be aware of how much alcohol is in your drink. In the [place] one drink equals one typical bottle of beer (12 oz), one-half glass of wine (5 oz), or one shot of hard liquor (1 oz).  Do not use any products that contain nicotine or tobacco, such as cigarettes and e-cigarettes. If you need help quitting, ask your health care provider. Summary  Having a healthy lifestyle and getting preventive care can help to protect your health and wellness after age 58.  Screening and testing are the best way to find a health problem early and help you avoid having a fall. Early diagnosis and treatment give you the  best chance for managing medical conditions that are more common for people who are older than age 17.  Falls are a major cause of broken bones and head injuries in people who are older than age 33. Take precautions to prevent a fall at home.  Work with your health care provider to learn what changes you can make to improve your health and wellness and to prevent falls. This information is not intended to replace advice given to you by your health care provider. Make sure you discuss any questions you have with your health care provider. Document Revised: 09/19/2018 Document Reviewed: 04/11/2017 Elsevier Patient Education  2021 [place], MD [facility]

## 2020-10-31 NOTE — Progress Notes (Deleted)
Office Visit Note  Patient: Angela Cummings             Date of Birth: Feb 11, 1956           MRN: [id number]             PCP: Horald Pollen, MD Referring: Horald Pollen, * Visit Date: 11/01/2020   Subjective:  No chief complaint on file.   History of Present Illness: Angela Cummings is a 65 y.o. female here for follow up for primary osteoarthritis of multiple sites on diclofenac PO BID PRN and fibromyalgia syndrome with recent life stressors and had intraarticular steroid injections at office visit in February.***     No Rheumatology ROS completed.   PMFS History:  Patient Active Problem List   Diagnosis Date Noted  . Osteoarthritis of left foot 10/06/2017  . GERD (gastroesophageal reflux disease) 08/23/2017  . Fibromyalgia 03/14/2015  . Degenerative disc disease, cervical 03/14/2015  . Primary osteoarthritis involving multiple joints 03/14/2015  . Depression 02/18/2014  . Asthma   . PAF (paroxysmal atrial fibrillation) ([place]) 11/28/2010  . Obesity 11/28/2010    Past Medical History:  Diagnosis Date  . Anxiety   . Asthma   . Atrial fibrillation ([place]) 11/2010   paroxysmal  . Bradycardia   . Depression   . DJD (degenerative joint disease)    Cervical spine  . Fibromyalgia    s/p rheumatology consultation/Z[facility]insky  . Mild sleep apnea   . Obesity   . Osteoarthritis    Shoulders, hips,knees    Family History  Problem Relation Age of Onset  . Emphysema Mother   . COPD Mother   . Anxiety disorder Mother   . Cancer Father        lung cancer; non-smoker  . Heart disease Father 68       AMI  . COPD Sister   . Emphysema Brother   . Cancer Brother   . Cancer Daughter   . Cancer Sister        lung cancer  . Heart disease Sister        AMI  . Diabetes Other   . Thyroid disease Other    Past Surgical History:  Procedure Laterality Date  . ABLATION  08/2012   PVI by Dr [name]  . ABLATION  11/13/2013   PVI by Dr [name]  . ATRIAL FIBRILLATION  ABLATION N/A 09/03/2012   Procedure: ATRIAL FIBRILLATION ABLATION;  Surgeon: Thompson Grayer, MD;  Location: [facility] CATH LAB;  Service: Cardiovascular;  Laterality: N/A;  . ATRIAL FIBRILLATION ABLATION N/A 11/13/2013   Procedure: ATRIAL FIBRILLATION ABLATION;  Surgeon: Coralyn Mark, MD;  Location: [place] CATH LAB;  Service: Cardiovascular;  Laterality: N/A;  . CARDIOVERSION  03/13/2012   Procedure: CARDIOVERSION;  Surgeon: [name], MD;  Location: [facility] ENDOSCOPY;  Service: Cardiovascular;  Laterality: N/A;  . CARDIOVERSION N/A 07/26/2012   Procedure: CARDIOVERSION;  Surgeon: Thompson Grayer, MD;  Location: [place];  Service: Cardiovascular;  Laterality: N/A;  . CARDIOVERSION N/A 02/20/2014   Procedure: CARDIOVERSION;  Surgeon: Larey Dresser, MD;  Location: [place];  Service: Cardiovascular;  Laterality: N/A;  . CESAREAN SECTION    . CHOLECYSTECTOMY    . HERNIA REPAIR    . KNEE SURGERY    . neck tumor resection     benign  . TEE WITHOUT CARDIOVERSION  03/13/2012   Procedure: TRANSESOPHAGEAL ECHOCARDIOGRAM (TEE);  Surgeon: [name], MD;  Location: [place];  Service: Cardiovascular;  Laterality: N/A;  Rm 2029  . TEE WITHOUT CARDIOVERSION N/A 09/02/2012   Procedure: TRANSESOPHAGEAL ECHOCARDIOGRAM (TEE);  Surgeon: Josue Hector, MD;  Location: [place];  Service: Cardiovascular;  Laterality: N/A;  . TEE WITHOUT CARDIOVERSION N/A 11/13/2013   Procedure: TRANSESOPHAGEAL ECHOCARDIOGRAM (TEE);  Surgeon: [name], MD;  Location: [facility] ENDOSCOPY;  Service: Cardiovascular;  Laterality: N/A;   Social History   Social History Narrative   Marital status: widowed since 01/2014 of melanoma; not dating.       Children:  3 children (17,30, 32) and 4 grandchildren and 1 gg. 1 adopted child.       Lives with son.  Lives in [place].        Employment:  Unemployed.       Tobacco: none      Alcohol: none      Drugs: none      Exercise: none   Immunization History  Administered Date(s)  Administered  . Fluad Quad(high Dose 65+) 03/11/2019, 04/08/2020  . Influenza,inj,Quad PF,6+ Mos 03/09/2013, 05/26/2014, 03/14/2015  . Influenza,inj,quad, With Preservative 08/27/2017  . PFIZER(Purple Top)SARS-COV-2 Vaccination 08/26/2019, 09/19/2019  . Pneumococcal Polysaccharide-23 06/25/2015, 10/07/2020  . Tdap 03/14/2015     Objective: Vital Signs: There were no vitals taken for this visit.   Physical Exam   Musculoskeletal Exam: ***  CDAI Exam: CDAI Score: -- Patient Global: --; Provider Global: -- Swollen: --; Tender: -- Joint Exam 11/01/2020   No joint exam has been documented for this visit   There is currently no information documented on the homunculus. Go to the Rheumatology activity and complete the homunculus joint exam.  Investigation: No additional findings.  Imaging: No results found.  Recent Labs: Lab Results  Component Value Date   WBC 5.4 07/21/2020   HGB 12.5 07/21/2020   PLT 302 07/21/2020   NA 140 07/21/2020   K 4.1 07/21/2020   CL 105 07/21/2020   CO2 24 07/21/2020   GLUCOSE 89 07/21/2020   BUN 10 07/21/2020   CREATININE 0.80 07/21/2020   BILITOT 0.4 07/21/2019   ALKPHOS 130 (H) 07/21/2019   AST 27 07/21/2019   ALT 29 07/21/2019   PROT 6.4 (L) 07/21/2019   ALBUMIN 3.8 07/21/2019   CALCIUM 9.0 07/21/2020   GFRAA >60 07/21/2019    Speciality Comments: No specialty comments available.  Procedures:  No procedures performed Allergies: Adhesive [tape]   Assessment / Plan:     Visit Diagnoses: No diagnosis found.  ***  Orders: No orders of the defined types were placed in this encounter.  No orders of the defined types were placed in this encounter.    Follow-Up Instructions: No follow-ups on file.   Collier Salina, MD  Note - This record has been created using [employer].  Chart creation errors have been sought, but may not always  have been located. Such creation errors do not reflect on  the standard of  medical care.

## 2020-11-01 ENCOUNTER — Ambulatory Visit: Payer: Medicare HMO | Admitting: Internal Medicine

## 2020-11-24 ENCOUNTER — Ambulatory Visit (INDEPENDENT_AMBULATORY_CARE_PROVIDER_SITE_OTHER): Payer: Medicare HMO | Admitting: Internal Medicine

## 2020-11-24 ENCOUNTER — Encounter: Payer: Self-pay | Admitting: Internal Medicine

## 2020-11-24 ENCOUNTER — Other Ambulatory Visit: Payer: Self-pay

## 2020-11-24 VITALS — BP 131/83 | HR 111 | Resp 16 | Ht 61.0 in | Wt 207.6 lb

## 2020-11-24 DIAGNOSIS — M5442 Lumbago with sciatica, left side: Secondary | ICD-10-CM | POA: Diagnosis not present

## 2020-11-24 DIAGNOSIS — M797 Fibromyalgia: Secondary | ICD-10-CM

## 2020-11-24 DIAGNOSIS — M8949 Other hypertrophic osteoarthropathy, multiple sites: Secondary | ICD-10-CM | POA: Diagnosis not present

## 2020-11-24 DIAGNOSIS — M545 Low back pain, unspecified: Secondary | ICD-10-CM | POA: Insufficient documentation

## 2020-11-24 DIAGNOSIS — M159 Polyosteoarthritis, unspecified: Secondary | ICD-10-CM

## 2020-11-24 MED ORDER — MELOXICAM 15 MG PO TABS: 15.0000 mg | ORAL_TABLET | Freq: Every day | ORAL | 5 refills | Status: AC

## 2020-11-24 NOTE — Patient Instructions (Signed)
Meloxicam capsules What is this medication? MELOXICAM (mel OX i cam) is a non-steroidal anti-inflammatory drug, also knownas an NSAID. It is used to treat pain, inflammation, and swelling. This medicine may be used for other purposes; ask your health care provider orpharmacist if you have questions. COMMON BRAND NAME(S): Vivlodex What should I tell my care team before I take this medication? They need to know if you have any of these conditions: asthma (lung or breathing disease) bleeding disorder coronary artery bypass graft (CABG) within the past 2 weeks dehydration heart attack heart disease heart failure high blood pressure if you often drink alcohol kidney disease liver disease smoke tobacco cigarettes stomach bleeding stomach ulcers, other stomach or intestine problems take medicines that treat or prevent blood clots taking other steroids like dexamethasone or prednisone an unusual or allergic reaction to meloxicam, other medicines, foods, dyes, or preservatives pregnant or trying to get pregnant breast-feeding How should I use this medication? Take this medicine by mouth. Take it as directed on the prescription label at the same time every day. You can take it with or without food. If it upsets your stomach, take it with food. Do not use it more often than directed. There may be unused or extra doses in the bottle after you finish your treatment.Talk to your health care provider if you have questions about your dose. A special MedGuide will be given to you by the pharmacist with eachprescription and refill. Be sure to read this information carefully each time. Talk to your health care provider about the use of this medicine in children.Special care may be needed. Patients over 79 years of age may have a stronger reaction and need a smallerdose. Overdosage: If you think you have taken too much of this medicine contact apoison control center or emergency room at once. NOTE:  This medicine is only for you. Do not share this medicine with others. What if I miss a dose? If you miss a dose, take it as soon as you can. If it is almost time for yournext dose, take only that dose. Do not take double or extra doses. What may interact with this medication? Do not take this medicine with any of the following medications: cidofovir ketorolac This medicine may also interact with the following medications: aspirin and aspirin-like medicines certain medicines for blood pressure, heart disease, irregular heart beat certain medicines for depression, anxiety, or psychotic disturbances certain medicines that treat or prevent blood clots like warfarin, enoxaparin, dalteparin, apixaban, dabigatran, rivaroxaban cyclosporine diuretics fluconazole lithium methotrexate other NSAIDs, medicines for pain and inflammation, like ibuprofen and naproxen pemetrexed This list may not describe all possible interactions. Give your health care provider a list of all the medicines, herbs, non-prescription drugs, or dietary supplements you use. Also tell them if you smoke, drink alcohol, or use illegaldrugs. Some items may interact with your medicine. What should I watch for while using this medication? Visit your health care provider for regular checks on your progress. Tell your health care provider if your symptoms do not start to get better or if they getworse. Do not take other medicines that contain aspirin, ibuprofen, or naproxen with this medicine. Side effects such as stomach upset, nausea, or ulcers may be more likely to occur. Many non-prescription medicines contain aspirin,ibuprofen, or naproxen. Always read labels carefully. This medicine can cause serious ulcers and bleeding in the stomach. It can happen with no warning. Smoking, drinking alcohol, older age, and poor health can also increase risks. Call your  health care provider right away if you havestomach pain or blood in your vomit  or stool. This medicine does not prevent a heart attack or stroke. This medicine may increase the chance of a heart attack or stroke. The chance may increase the longer you use this medicine or if you have heart disease. If you take aspirin to prevent a heart attack or stroke, talk to your health care provider aboutusing this medicine. Alcohol may interfere with the effect of this medicine. Avoid alcoholic drinks. This medicine may cause serious skin reactions. They can happen weeks to months after starting the medicine. Contact your health care provider right away if you notice fevers or flu-like symptoms with a rash. The rash may be red or purple and then turn into blisters or peeling of the skin. Or, you might notice a red rash with swelling of the face, lips or lymph nodes in your neck or underyour arms. Talk to your health care provider if you are pregnant before taking this medicine. Taking this medicine between weeks 20 and 30 of pregnancy may harm your unborn baby. Your health care provider will monitor you closely if youneed to take it. After 30 weeks of pregnancy, do not take this medicine. You may get drowsy or dizzy. Do not drive, use machinery, or do anything that needs mental alertness until you know how this medicine affects you. Do not stand up or sit up quickly, especially if you are an older patient. Thisreduces the risk of dizzy or fainting spells. Be careful brushing or flossing your teeth or using a toothpick because you may get an infection or bleed more easily. If you have any dental work done, Primary school teacher you are receiving this medicine. This medicine may make it more difficult to get pregnant. Talk to your healthcare provider if you are concerned about your fertility. What side effects may I notice from receiving this medication? Side effects that you should report to your doctor or health care provider assoon as possible: allergic reactions (skin rash, itching or hives;  swelling of the face, lips, or tongue) bleeding (bloody or black, tarry stools; red or dark brown urine; spitting up blood or brown material that looks like coffee grounds; red spots on the skin; unusual bruising or bleeding from the eyes, gums, or nose) blood clot (chest pain; shortness of breath; pain, swelling, or warmth in the leg) general ill feeling or flu-like symptoms high potassium levels (chest pain; fast, irregular heartbeat; muscle weakness) kidney injury (trouble passing urine or change in the amount of urine) light-colored stool liver injury (dark yellow or brown urine; general ill feeling or flu-like symptoms; loss of appetite, right upper belly pain; unusually weak or tired, yellowing of the eyes or skin) low red blood cell counts (trouble breathing; feeling faint; lightheaded, falls; unusually weak or tired) rash, fever, and swollen lymph nodes redness, blistering, peeling, or loosening of the skin, including inside the mouth stroke (changes in vision; confusion; trouble speaking or understanding; severe headaches; sudden numbness or weakness of the face, arm or leg; trouble walking; dizziness; loss of balance or coordination) Side effects that usually do not require medical attention (report to yourdoctor or health care provider if they continue or are bothersome): constipation diarrhea dizziness gas headache heartburn nausea, vomiting This list may not describe all possible side effects. Call your doctor for medical advice about side effects. You may report side effects to FDA at1-800-FDA-1088. Where should I keep my medication? Keep out of the reach of  children and pets. Store at room temperature between 20 and 25 degrees C (68 and 77 degrees F). Keep this drug in the original container. Protect from moisture. Keep thecontainer tightly closed. Get rid of any unused medicine after the expiration date. To get rid of medicines that are no longer needed or have expired: Take  the medicine to a medicine take-back program. Check with your pharmacy or law enforcement to find a location. If you cannot return the medicine, check the label or package insert to see if the medicine should be thrown out in the garbage or flushed down the toilet. If you are not sure, ask your health care provider. If it is safe to put it in the trash, empty the medicine out of the container. Mix the medicine with cat litter, dirt, coffee grounds, or other unwanted substance. Seal the mixture in a bag or container. Put it in the trash. NOTE: This sheet is a summary. It may not cover all possible information. If you have questions about this medicine, talk to your doctor, pharmacist, orhealth care provider.  2022 Elsevier/Gold Standard (2020-05-10 15:21:19)

## 2020-11-24 NOTE — Progress Notes (Signed)
Office Visit Note  Patient: Angela Cummings             Date of Birth: 1956/03/14           MRN: [id number]             PCP: Horald Pollen, MD Referring: Horald Pollen, * Visit Date: 11/24/2020  Subjective:  Other (Low back pain that radiates down left leg. Cortisone injection at the last visit provided relief. Patient would like to discuss medication change. )   History of Present Illness: Angela Cummings is a 65 y.o. female here for follow up of osteoarthritis with right shoulder and left knee injections about 3 months ago and possible fibromyalgia syndrome in exacerbation due to stressors. She feels that the injections were very helpful, between these and NSAID use her pain was nearly completely improved at least for a time. She reports today that the diclofenac-misoprostol is prohibitively costly, requests if meloxicam is an option after discussion with her pharmacy.   Review of Systems  Constitutional:  Positive for fatigue.  HENT:  Negative for mouth sores, mouth dryness and nose dryness.   Eyes:  Negative for pain, itching and dryness.  Respiratory:  Negative for shortness of breath and difficulty breathing.   Cardiovascular:  Negative for chest pain and palpitations.  Gastrointestinal:  Negative for blood in stool, constipation and diarrhea.  Endocrine: Negative for increased urination.  Genitourinary:  Negative for difficulty urinating.  Musculoskeletal:  Positive for joint pain, joint pain, joint swelling, myalgias, morning stiffness, muscle tenderness and myalgias.  Skin:  Negative for color change, rash and redness.  Allergic/Immunologic: Negative for susceptible to infections.  Neurological:  Negative for dizziness, numbness, headaches, memory loss and weakness.  Hematological:  Positive for bruising/bleeding tendency.  Psychiatric/Behavioral:  Negative for confusion.    PMFS History:  Patient Active Problem List   Diagnosis Date Noted   Low back  pain 11/24/2020   Osteoarthritis of left foot 10/06/2017   GERD (gastroesophageal reflux disease) 08/23/2017   Fibromyalgia 03/14/2015   Degenerative disc disease, cervical 03/14/2015   Primary osteoarthritis involving multiple joints 03/14/2015   Depression 02/18/2014   Asthma    PAF (paroxysmal atrial fibrillation) ([place]) 11/28/2010   Obesity 11/28/2010    Past Medical History:  Diagnosis Date   Anxiety    Asthma    Atrial fibrillation ([place]) 11/2010   paroxysmal   Bradycardia    Depression    DJD (degenerative joint disease)    Cervical spine   Fibromyalgia    s/p rheumatology consultation/[facility]   Mild sleep apnea    Obesity    Osteoarthritis    Shoulders, hips,knees    Family History  Problem Relation Age of Onset   Emphysema Mother    COPD Mother    Anxiety disorder Mother    Cancer Father        lung cancer; non-smoker   Heart disease Father 71       AMI   COPD Sister    Emphysema Brother    Cancer Brother    Cancer Daughter    Cancer Sister        lung cancer   Heart disease Sister        AMI   Diabetes Other    Thyroid disease Other    Past Surgical History:  Procedure Laterality Date   ABLATION  08/2012   PVI by Dr [name]   ABLATION  11/13/2013   PVI by Dr  Allred   ATRIAL FIBRILLATION ABLATION N/A 09/03/2012   Procedure: ATRIAL FIBRILLATION ABLATION;  Surgeon: Thompson Grayer, MD;  Location[facility] CATH LAB;  Service: Cardiovascular;  Laterality: N/A;   ATRIAL FIBRILLATION ABLATION N/A 11/13/2013   Procedure: ATRIAL FIBRILLATION ABLATION;  Surgeon: Coralyn Mark, MD;  Location: [place] CATH LAB;  Service: Cardiovascular;  Laterality: N/A;   CARDIOVERSION  03/13/2012   Procedure: CARDIOVERSION;  Surgeon: [name], MD;  Location: [place];  Service: Cardiovascular;  Laterality: N/A;   CARDIOVERSION N/A 07/26/2012   Procedure: CARDIOVERSION;  Surgeon: Thompson Grayer, MD;  Location: [place];  Service: Cardiovascular;  Laterality: N/A;   CARDIOVERSION N/A  02/20/2014   Procedure: CARDIOVERSION;  Surgeon: Larey Dresser, MD;  Location: [place];  Service: Cardiovascular;  Laterality: N/A;   CESAREAN SECTION     CHOLECYSTECTOMY     HERNIA REPAIR     KNEE SURGERY     neck tumor resection     benign   TEE WITHOUT CARDIOVERSION  03/13/2012   Procedure: TRANSESOPHAGEAL ECHOCARDIOGRAM (TEE);  Surgeon: [name], MD;  Location: [place];  Service: Cardiovascular;  Laterality: N/A;  Rm 2029   TEE WITHOUT CARDIOVERSION N/A 09/02/2012   Procedure: TRANSESOPHAGEAL ECHOCARDIOGRAM (TEE);  Surgeon: Josue Hector, MD;  Location: [place];  Service: Cardiovascular;  Laterality: N/A;   TEE WITHOUT CARDIOVERSION N/A 11/13/2013   Procedure: TRANSESOPHAGEAL ECHOCARDIOGRAM (TEE);  Surgeon: [name], MD;  Location: [facility] ENDOSCOPY;  Service: Cardiovascular;  Laterality: N/A;   Social History   Social History Narrative   Marital status: widowed since 01/2014 of melanoma; not dating.       Children:  3 children (17,30, 32) and 4 grandchildren and 1 gg. 1 adopted child.       Lives with son.  Lives in [place].        Employment:  Unemployed.       Tobacco: none      Alcohol: none      Drugs: none      Exercise: none   Immunization History  Administered Date(s) Administered   Fluad Quad(high Dose 65+) 03/11/2019, 04/08/2020   Influenza,inj,Quad PF,6+ Mos 03/09/2013, 05/26/2014, 03/14/2015   Influenza,inj,quad, With Preservative 08/27/2017   PFIZER(Purple Top)SARS-COV-2 Vaccination 08/26/2019, 09/19/2019   Pneumococcal Polysaccharide-23 06/25/2015, 10/07/2020   Tdap 03/14/2015     Objective: Vital Signs: BP 131/83 (BP Location: Left Arm, Patient Position: Sitting, Cuff Size: Normal)   Pulse (!) 111   Resp 16   Ht 5\' 1"  (1.549 m)   Wt 207 lb 9.6 oz (94.2 kg)   BMI 39.23 kg/m    Physical Exam Constitutional:      Appearance: She is obese.  Skin:    General: Skin is warm and dry.     Findings: No rash.  Neurological:      General: No focal deficit present.     Mental Status: She is alert.     Musculoskeletal Exam:  Neck full ROM no tenderness Shoulders full ROM no tenderness or swelling Elbows full ROM no tenderness or swelling Wrists full ROM no tenderness or swelling Fingers full ROM no tenderness or swelling Bilateral L>R paraspinal tenderness over lumosacral spine Hips Pace maneuver provokes posterior and lateral hip pain on left side Knees full ROM no tenderness or swelling   Investigation: No additional findings.  Imaging: No results found.  Recent Labs: Lab Results  Component Value Date   WBC 5.4 07/21/2020   HGB 12.5 07/21/2020   PLT  302 07/21/2020   NA 139 11/24/2020   K 4.6 11/24/2020   CL 103 11/24/2020   CO2 26 11/24/2020   GLUCOSE 93 11/24/2020   BUN 12 11/24/2020   CREATININE 0.82 11/24/2020   BILITOT 0.4 07/21/2019   ALKPHOS 130 (H) 07/21/2019   AST 27 07/21/2019   ALT 29 07/21/2019   PROT 6.4 (L) 07/21/2019   ALBUMIN 3.8 07/21/2019   CALCIUM 9.4 11/24/2020   GFRAA >60 07/21/2019    Speciality Comments: No specialty comments available.  Procedures:  No procedures performed Allergies: Adhesive [tape]   Assessment / Plan:     Visit Diagnoses: Primary osteoarthritis involving multiple joints - Plan: Basic Metabolic Panel (BMET), meloxicam (MOBIC) 15 MG tablet  [place] generalized OA doing fairly well today. Diclofenac-misoprostol was prescribed in PCP office due to risk for GI bleed complication is increased due to long term use of xarelto for Afib. She takes pantoprazole 40 mg PO BID already for prophylaxis. I discussed importance of using this medication with food and being aware of any signs or symptoms such as epigastric pain or increased reflux symptoms. Also checking BMP to ensure no drift in renal function with daily NSAID use.  Acute midline low back pain with left-sided sciatica Fibromyalgia  Currently doing well, left hip and leg problems likely represent  some worsening due to myofascial pain with some radiation. This should be self limited with NSAID use and stretching should improve.   Orders: Orders Placed This Encounter  Procedures   Basic Metabolic Panel (BMET)    Meds ordered this encounter  Medications   meloxicam (MOBIC) 15 MG tablet    Sig: Take 1 tablet (15 mg total) by mouth daily.    Dispense:  30 tablet    Refill:  5     Follow-Up Instructions: Return in about 6 months (around 05/26/2021) for OA, FMS on meloxicam f/u mos.   Collier Salina, MD  Note - This record has been created using [employer].  Chart creation errors have been sought, but may not always  have been located. Such creation errors do not reflect on  the standard of medical care.

## 2020-11-25 LAB — BASIC METABOLIC PANEL
BUN: 12 mg/dL (ref 7–25)
CO2: 26 mmol/L (ref 20–32)
Calcium: 9.4 mg/dL (ref 8.6–10.4)
Chloride: 103 mmol/L (ref 98–110)
Creat: 0.82 mg/dL (ref 0.50–0.99)
Glucose, Bld: 93 mg/dL (ref 65–99)
Potassium: 4.6 mmol/L (ref 3.5–5.3)
Sodium: 139 mmol/L (ref 135–146)

## 2021-01-26 ENCOUNTER — Inpatient Hospital Stay (HOSPITAL_COMMUNITY)
Admission: EM | Admit: 2021-01-26 | Discharge: 2021-01-29 | DRG: 301 | Disposition: A | Discharge status: Home / Self Care | Payer: Medicare Other | Attending: Family Medicine | Admitting: Family Medicine

## 2021-01-26 ENCOUNTER — Encounter (HOSPITAL_COMMUNITY): Payer: Self-pay | Admitting: Nurse Practitioner

## 2021-01-26 ENCOUNTER — Other Ambulatory Visit: Payer: Self-pay

## 2021-01-26 ENCOUNTER — Ambulatory Visit (HOSPITAL_BASED_OUTPATIENT_CLINIC_OR_DEPARTMENT_OTHER)
Admission: RE | Admit: 2021-01-26 | Discharge: 2021-01-26 | Disposition: A | Discharge status: Home / Self Care | Payer: Medicare Other | Source: Ambulatory Visit | Attending: Nurse Practitioner | Admitting: Nurse Practitioner

## 2021-01-26 ENCOUNTER — Encounter (HOSPITAL_COMMUNITY): Payer: Self-pay

## 2021-01-26 VITALS — BP 118/72 | HR 88 | Ht 61.0 in | Wt 209.2 lb

## 2021-01-26 DIAGNOSIS — M7989 Other specified soft tissue disorders: Secondary | ICD-10-CM

## 2021-01-26 DIAGNOSIS — Z8616 Personal history of COVID-19: Secondary | ICD-10-CM

## 2021-01-26 DIAGNOSIS — K219 Gastro-esophageal reflux disease without esophagitis: Secondary | ICD-10-CM | POA: Diagnosis not present

## 2021-01-26 DIAGNOSIS — Z818 Family history of other mental and behavioral disorders: Secondary | ICD-10-CM | POA: Diagnosis not present

## 2021-01-26 DIAGNOSIS — I82412 Acute embolism and thrombosis of left femoral vein: Secondary | ICD-10-CM | POA: Diagnosis not present

## 2021-01-26 DIAGNOSIS — I82432 Acute embolism and thrombosis of left popliteal vein: Secondary | ICD-10-CM | POA: Diagnosis not present

## 2021-01-26 DIAGNOSIS — J4520 Mild intermittent asthma, uncomplicated: Secondary | ICD-10-CM | POA: Diagnosis not present

## 2021-01-26 DIAGNOSIS — Z20822 Contact with and (suspected) exposure to covid-19: Secondary | ICD-10-CM | POA: Diagnosis present

## 2021-01-26 DIAGNOSIS — I82442 Acute embolism and thrombosis of left tibial vein: Secondary | ICD-10-CM | POA: Diagnosis present

## 2021-01-26 DIAGNOSIS — M170 Bilateral primary osteoarthritis of knee: Secondary | ICD-10-CM | POA: Diagnosis present

## 2021-01-26 DIAGNOSIS — Z79899 Other long term (current) drug therapy: Secondary | ICD-10-CM

## 2021-01-26 DIAGNOSIS — Z801 Family history of malignant neoplasm of trachea, bronchus and lung: Secondary | ICD-10-CM | POA: Diagnosis not present

## 2021-01-26 DIAGNOSIS — I824Y2 Acute embolism and thrombosis of unspecified deep veins of left proximal lower extremity: Secondary | ICD-10-CM | POA: Diagnosis not present

## 2021-01-26 DIAGNOSIS — M19011 Primary osteoarthritis, right shoulder: Secondary | ICD-10-CM | POA: Diagnosis not present

## 2021-01-26 DIAGNOSIS — I82409 Acute embolism and thrombosis of unspecified deep veins of unspecified lower extremity: Secondary | ICD-10-CM | POA: Diagnosis not present

## 2021-01-26 DIAGNOSIS — G4730 Sleep apnea, unspecified: Secondary | ICD-10-CM | POA: Diagnosis not present

## 2021-01-26 DIAGNOSIS — M797 Fibromyalgia: Secondary | ICD-10-CM | POA: Diagnosis not present

## 2021-01-26 DIAGNOSIS — M19012 Primary osteoarthritis, left shoulder: Secondary | ICD-10-CM | POA: Diagnosis not present

## 2021-01-26 DIAGNOSIS — I480 Paroxysmal atrial fibrillation: Secondary | ICD-10-CM | POA: Insufficient documentation

## 2021-01-26 DIAGNOSIS — Z791 Long term (current) use of non-steroidal anti-inflammatories (NSAID): Secondary | ICD-10-CM

## 2021-01-26 DIAGNOSIS — I82452 Acute embolism and thrombosis of left peroneal vein: Secondary | ICD-10-CM | POA: Diagnosis not present

## 2021-01-26 DIAGNOSIS — R609 Edema, unspecified: Secondary | ICD-10-CM

## 2021-01-26 DIAGNOSIS — M19072 Primary osteoarthritis, left ankle and foot: Secondary | ICD-10-CM | POA: Diagnosis not present

## 2021-01-26 DIAGNOSIS — D6869 Other thrombophilia: Secondary | ICD-10-CM | POA: Diagnosis not present

## 2021-01-26 DIAGNOSIS — Z825 Family history of asthma and other chronic lower respiratory diseases: Secondary | ICD-10-CM

## 2021-01-26 DIAGNOSIS — J45909 Unspecified asthma, uncomplicated: Secondary | ICD-10-CM | POA: Diagnosis present

## 2021-01-26 DIAGNOSIS — M160 Bilateral primary osteoarthritis of hip: Secondary | ICD-10-CM | POA: Diagnosis not present

## 2021-01-26 DIAGNOSIS — Z6839 Body mass index (BMI) 39.0-39.9, adult: Secondary | ICD-10-CM | POA: Insufficient documentation

## 2021-01-26 DIAGNOSIS — E669 Obesity, unspecified: Secondary | ICD-10-CM | POA: Insufficient documentation

## 2021-01-26 DIAGNOSIS — Z8249 Family history of ischemic heart disease and other diseases of the circulatory system: Secondary | ICD-10-CM

## 2021-01-26 DIAGNOSIS — M47812 Spondylosis without myelopathy or radiculopathy, cervical region: Secondary | ICD-10-CM | POA: Diagnosis not present

## 2021-01-26 DIAGNOSIS — Z7901 Long term (current) use of anticoagulants: Secondary | ICD-10-CM | POA: Insufficient documentation

## 2021-01-26 DIAGNOSIS — I440 Atrioventricular block, first degree: Secondary | ICD-10-CM | POA: Insufficient documentation

## 2021-01-26 DIAGNOSIS — I4510 Unspecified right bundle-branch block: Secondary | ICD-10-CM | POA: Insufficient documentation

## 2021-01-26 DIAGNOSIS — Z833 Family history of diabetes mellitus: Secondary | ICD-10-CM | POA: Diagnosis not present

## 2021-01-26 DIAGNOSIS — F411 Generalized anxiety disorder: Secondary | ICD-10-CM | POA: Diagnosis present

## 2021-01-26 DIAGNOSIS — I82402 Acute embolism and thrombosis of unspecified deep veins of left lower extremity: Secondary | ICD-10-CM | POA: Diagnosis not present

## 2021-01-26 DIAGNOSIS — Z888 Allergy status to other drugs, medicaments and biological substances status: Secondary | ICD-10-CM

## 2021-01-26 LAB — COMPREHENSIVE METABOLIC PANEL
ALT: 18 U/L (ref 0–44)
AST: 21 U/L (ref 15–41)
Albumin: 3.7 g/dL (ref 3.5–5.0)
Alkaline Phosphatase: 171 U/L — ABNORMAL HIGH (ref 38–126)
Anion gap: 8 (ref 5–15)
BUN: 10 mg/dL (ref 8–23)
CO2: 27 mmol/L (ref 22–32)
Calcium: 9.4 mg/dL (ref 8.9–10.3)
Chloride: 103 mmol/L (ref 98–111)
Creatinine, Ser: 0.81 mg/dL (ref 0.44–1.00)
GFR, Estimated: >60 mL/min (ref >60)
Glucose, Bld: 98 mg/dL (ref 70–99)
Potassium: 4.1 mmol/L (ref 3.5–5.1)
Sodium: 138 mmol/L (ref 135–145)
Total Bilirubin: 0.4 mg/dL (ref 0.3–1.2)
Total Protein: 7 g/dL (ref 6.5–8.1)

## 2021-01-26 LAB — CBC WITH DIFFERENTIAL/PLATELET
Abs Immature Granulocytes: 0.02 10*3/uL (ref 0.00–0.07)
Basophils Absolute: 0.1 10*3/uL (ref 0.0–0.1)
Basophils Relative: 1 %
Eosinophils Absolute: 0.2 10*3/uL (ref 0.0–0.5)
Eosinophils Relative: 3 %
HCT: 41.6 % (ref 36.0–46.0)
Hemoglobin: 13.1 g/dL (ref 12.0–15.0)
Immature Granulocytes: 0 %
Lymphocytes Relative: 26 %
Lymphs Abs: 1.6 10*3/uL (ref 0.7–4.0)
MCH: 27 pg (ref 26.0–34.0)
MCHC: 31.5 g/dL (ref 30.0–36.0)
MCV: 85.8 fL (ref 80.0–100.0)
Monocytes Absolute: 0.7 10*3/uL (ref 0.1–1.0)
Monocytes Relative: 12 %
Neutro Abs: 3.6 10*3/uL (ref 1.7–7.7)
Neutrophils Relative %: 58 %
Platelets: 295 10*3/uL (ref 150–400)
RBC: 4.85 MIL/uL (ref 3.87–5.11)
RDW: 14.4 % (ref 11.5–15.5)
WBC: 6.1 10*3/uL (ref 4.0–10.5)
nRBC: 0 % (ref 0.0–0.2)

## 2021-01-26 LAB — RESP PANEL BY RT-PCR (FLU A&B, COVID) ARPGX2
Influenza A by PCR: NEGATIVE
Influenza B by PCR: NEGATIVE
SARS Coronavirus 2 by RT PCR: NEGATIVE

## 2021-01-26 MED ORDER — ACETAMINOPHEN 325 MG PO TABS
650.0000 mg | ORAL_TABLET | Freq: Four times a day (QID) | ORAL | Status: DC | PRN
  Administered 2021-01-27 – 2021-01-28 (×3): 650 mg via ORAL
  Filled 2021-01-26 (×4): qty 2

## 2021-01-26 MED ORDER — NALOXONE HCL 0.4 MG/ML IJ SOLN: 0.4000 mg | INTRAMUSCULAR | Status: DC | PRN

## 2021-01-26 MED ORDER — LORAZEPAM 1 MG PO TABS: 1.0000 mg | ORAL_TABLET | Freq: Every day | ORAL | Status: DC | PRN

## 2021-01-26 MED ORDER — ACETAMINOPHEN 650 MG RE SUPP: 650.0000 mg | Freq: Four times a day (QID) | RECTAL | Status: DC | PRN

## 2021-01-26 MED ORDER — METOPROLOL SUCCINATE ER 25 MG PO TB24
12.5000 mg | ORAL_TABLET | Freq: Every day | ORAL | Status: DC
  Administered 2021-01-27 – 2021-01-29 (×3): 12.5 mg via ORAL
  Filled 2021-01-26 (×3): qty 1

## 2021-01-26 MED ORDER — ALBUTEROL SULFATE (2.5 MG/3ML) 0.083% IN NEBU: 3.0000 mL | INHALATION_SOLUTION | Freq: Four times a day (QID) | RESPIRATORY_TRACT | Status: DC | PRN

## 2021-01-26 MED ORDER — PANTOPRAZOLE SODIUM 40 MG IV SOLR
40.0000 mg | Freq: Two times a day (BID) | INTRAVENOUS | Status: DC
  Administered 2021-01-26 – 2021-01-27 (×2): 40 mg via INTRAVENOUS
  Filled 2021-01-26 (×2): qty 40

## 2021-01-26 MED ORDER — MELOXICAM 7.5 MG PO TABS
15.0000 mg | ORAL_TABLET | Freq: Every day | ORAL | Status: DC
  Administered 2021-01-27 – 2021-01-29 (×3): 15 mg via ORAL
  Filled 2021-01-26 (×4): qty 2

## 2021-01-26 MED ORDER — DILTIAZEM HCL ER COATED BEADS 240 MG PO CP24
240.0000 mg | ORAL_CAPSULE | Freq: Every day | ORAL | Status: DC
  Administered 2021-01-27 – 2021-01-29 (×3): 240 mg via ORAL
  Filled 2021-01-26 (×2): qty 2
  Filled 2021-01-26: qty 1
  Filled 2021-01-26: qty 2
  Filled 2021-01-26 (×3): qty 1

## 2021-01-26 MED ORDER — FENTANYL CITRATE PF 50 MCG/ML IJ SOSY
25.0000 ug | PREFILLED_SYRINGE | INTRAMUSCULAR | Status: DC | PRN
  Administered 2021-01-26: 25 ug via INTRAVENOUS
  Filled 2021-01-26: qty 1

## 2021-01-26 MED ORDER — HEPARIN (PORCINE) 25000 UT/250ML-% IV SOLN
1500.0000 [IU]/h | INTRAVENOUS | Status: AC
  Administered 2021-01-26: 1550 [IU]/h via INTRAVENOUS
  Administered 2021-01-27: 1700 [IU]/h via INTRAVENOUS
  Administered 2021-01-28: 1500 [IU]/h via INTRAVENOUS
  Filled 2021-01-26 (×3): qty 250

## 2021-01-26 NOTE — ED Provider Notes (Signed)
Emergency Medicine Provider Triage Evaluation Note  Angela Cummings , a 65 y.o. female  was evaluated in triage.  Pt complains of left leg swelling for a few weeks, on Xarelto for a-fib, no missed doses. Went to a fib clinic and diagnosed with extensive left leg DVT, sent to the ER. No CP or SHOB.  Review of Systems  Positive: Leg swelling Negative: CP, SHOB  Physical Exam  BP 129/83 (BP Location: Right Arm)   Pulse 77   Temp 97.8 F (36.6 C)   Resp 16   Ht '5\' 1"'$  (1.549 m)   Wt 94.8 kg   SpO2 100%   BMI 39.49 kg/m  Gen:   Awake, no distress   Resp:  Normal effort  MSK:   Moves extremities without difficulty  Other:  Left lower extremity swelling, pitting edema to foot  Medical Decision Making  Medically screening exam initiated at 11:07 AM.  Appropriate orders placed.  Angela Cummings was informed that the remainder of the evaluation will be completed by another provider, this initial triage assessment does not replace that evaluation, and the importance of remaining in the ED until their evaluation is complete.     Tacy Learn, PA-C 01/26/21 1108    Wyvonnia Dusky, MD 01/26/21 1235

## 2021-01-26 NOTE — Consult Note (Signed)
Vascular and Vein Specialist of [facility]  Patient name: Angela Cummings MRN: [id number] DOB: 06-18-1955 Sex: female   REQUESTING PROVIDER:   ER   REASON FOR CONSULT:    Left leg DVT  HISTORY OF PRESENT ILLNESS:   Angela Cummings is a 65 y.o. female, who I was asked to evaluate for a left leg DVT.  The patient presented to A. fib clinic today complaining of leg swelling for 2 weeks.  She has been traveling long distances due to recent deaths in her family.  Ultrasound shows a left leg DVT.  She does not have chest pain.  She has been taking Xarelto without interruption in therapy.  PAST MEDICAL HISTORY    Past Medical History:  Diagnosis Date   Anxiety    Asthma    Atrial fibrillation ([place]) 11/2010   paroxysmal   Bradycardia    Depression    DJD (degenerative joint disease)    Cervical spine   Fibromyalgia    s/p rheumatology consultation/Zeminsky   Mild sleep apnea    Obesity    Osteoarthritis    Shoulders, hips,knees     FAMILY HISTORY   Family History  Problem Relation Age of Onset   Emphysema Mother    COPD Mother    Anxiety disorder Mother    Cancer Father        lung cancer; non-smoker   Heart disease Father 4       AMI   COPD Sister    Emphysema Brother    Cancer Brother    Cancer Daughter    Cancer Sister        lung cancer   Heart disease Sister        AMI   Diabetes Other    Thyroid disease Other     SOCIAL HISTORY:   Social History   Socioeconomic History   Marital status: Widowed    Spouse name: Not on file   Number of children: 3   Years of education: Not on file   Highest education level: Not on file  Occupational History   Not on file  Tobacco Use   Smoking status: Never   Smokeless tobacco: Never  Vaping Use   Vaping Use: Never used  Substance and Sexual Activity   Alcohol use: Yes    Comment: Rarely- once every few months   Drug use: No   Sexual activity: Never  Other Topics  Concern   Not on file  Social History Narrative   Marital status: widowed since 01/2014 of melanoma; not dating.       Children:  3 children (17,30, 32) and 4 grandchildren and 1 gg. 1 adopted child.       Lives with son.  Lives in [place].        Employment:  Unemployed.       Tobacco: none      Alcohol: none      Drugs: none      Exercise: none   Social Determinants of Health   Financial Resource Strain: Not on file  Food Insecurity: Not on file  Transportation Needs: Not on file  Physical Activity: Not on file  Stress: Not on file  Social Connections: Not on file  Intimate Partner Violence: Not on file    ALLERGIES:    Allergies  Allergen Reactions   Adhesive [Tape] Itching and Rash    Paper tape is better but best to use no adhesive if possible  CURRENT MEDICATIONS:    Current Facility-Administered Medications  Medication Dose Route Frequency Provider Last Rate Last Admin   acetaminophen (TYLENOL) tablet 650 mg  650 mg Oral Q6H PRN Howerter, Justin B, DO       Or   acetaminophen (TYLENOL) suppository 650 mg  650 mg Rectal Q6H PRN Howerter, Justin B, DO       albuterol (VENTOLIN HFA) 108 (90 Base) MCG/ACT inhaler 2 puff  2 puff Inhalation Q6H PRN Howerter, Justin B, DO       [START ON 01/27/2021] diltiazem (TIAZAC) 24 hr capsule 240 mg  240 mg Oral Daily Howerter, Justin B, DO       fentaNYL (SUBLIMAZE) injection 25 mcg  25 mcg Intravenous Q2H PRN Howerter, Justin B, DO   25 mcg at 01/26/21 2118   heparin ADULT infusion 100 units/mL (25000 units/291m)  1,550 Units/hr Intravenous Continuous MLavenia Atlas RPH 15.5 mL/hr at 01/26/21 2043 1,550 Units/hr at 01/26/21 2043   LORazepam (ATIVAN) tablet 1 mg  1 mg Oral Daily PRN Howerter, Justin B, DO       [START ON 01/27/2021] meloxicam (MOBIC) tablet 15 mg  15 mg Oral Daily Howerter, Justin B, DO       [START ON 01/27/2021] metoprolol succinate (TOPROL-XL) 24 hr tablet 12.5 mg  12.5 mg Oral Daily Howerter, Justin  B, DO       naloxone (NARCAN) injection 0.4 mg  0.4 mg Intravenous PRN Howerter, Justin B, DO       pantoprazole (PROTONIX) injection 40 mg  40 mg Intravenous Q12H Howerter, Justin B, DO   40 mg at 01/26/21 2117   Current Outpatient Medications  Medication Sig Dispense Refill   acetaminophen (TYLENOL) 500 MG tablet Take 1,000 mg by mouth every 6 (six) hours as needed for moderate pain or headache.     ibuprofen (ADVIL) 200 MG tablet Take 400 mg by mouth every 6 (six) hours as needed for mild pain.     KLOR-CON M20 20 MEQ tablet TAKE 2 TABLETS (40 MEQ TOTAL) 2 (TWO) TIMES DAILY BY MOUTH. (Patient taking differently: Take 40 mEq by mouth 2 (two) times daily.) 120 tablet 8   LORazepam (ATIVAN) 1 MG tablet Take 1 mg by mouth as needed for anxiety.     meloxicam (MOBIC) 15 MG tablet Take 1 tablet (15 mg total) by mouth daily. 30 tablet 5   metoprolol succinate (TOPROL XL) 25 MG 24 hr tablet Take 0.5 tablets (12.5 mg total) by mouth daily. 30 tablet 6   Multiple Vitamins-Minerals (CENTRUM WOMEN PO) Take 1 tablet by mouth daily.     pantoprazole (PROTONIX) 40 MG tablet TAKE 1 TABLET BY MOUTH TWICE A DAY (Patient taking differently: Take 40 mg by mouth 2 (two) times daily.) 180 tablet 0   PROVENTIL HFA 108 (90 Base) MCG/ACT inhaler INHALE 2 PUFFS INTO THE LUNGS EVERY 6 HOURS AS NEEDED (Patient taking differently: Inhale 2 puffs into the lungs every 6 (six) hours as needed for shortness of breath or wheezing.) 6.7 g 0   TIADYLT ER 240 MG 24 hr capsule TAKE 1 CAPSULE BY MOUTH EVERY DAY (Patient taking differently: Take 240 mg by mouth daily.) 90 capsule 1   XARELTO 20 MG TABS tablet TAKE 1 TABLET EVERY DAY WITH SUPPER (Patient taking differently: Take 20 mg by mouth daily with supper.) 90 tablet 2    REVIEW OF SYSTEMS:   '[X]'$  denotes positive finding, '[ ]'$  denotes negative finding Cardiac  Comments:  Chest pain  or chest pressure:    Shortness of breath upon exertion:    Short of breath when lying flat:     Irregular heart rhythm: x       Vascular    Pain in calf, thigh, or hip brought on by ambulation:    Pain in feet at night that wakes you up from your sleep:     Blood clot in your veins: x   Leg swelling:  x       Pulmonary    Oxygen at home:    Productive cough:     Wheezing:         Neurologic    Sudden weakness in arms or legs:     Sudden numbness in arms or legs:     Sudden onset of difficulty speaking or slurred speech:    Temporary loss of vision in one eye:     Problems with dizziness:         Gastrointestinal    Blood in stool:      Vomited blood:         Genitourinary    Burning when urinating:     Blood in urine:        Psychiatric    Major depression:         Hematologic    Bleeding problems:    Problems with blood clotting too easily:        Skin    Rashes or ulcers:        Constitutional    Fever or chills:     PHYSICAL EXAM:   Vitals:   01/26/21 1723 01/26/21 1905 01/26/21 2115 01/26/21 2123  BP: 133/70 (!) 150/110 99/72 111/62  Pulse: 98 89 95 87  Resp: '14 18 17 18  '$ Temp:      TempSrc:      SpO2: 100% 100% 98% 98%  Weight:      Height:        GENERAL: The patient is a well-nourished female, in no acute distress. The vital signs are documented above. CARDIAC: There is a regular rate and rhythm.  VASCULAR: Palpable pedal pulses.  Left leg edema PULMONARY: Nonlabored respirations ABDOMEN: Soft and non-tender   MUSCULOSKELETAL: There are no major deformities or cyanosis. NEUROLOGIC: No focal weakness or paresthesias are detected. SKIN: There are no ulcers or rashes noted. PSYCHIATRIC: The patient has a normal affect.  STUDIES:   I have reviewed her ultrasound with the following RIGHT:  - No evidence of common femoral vein obstruction.     LEFT:  - Findings consistent with acute deep vein thrombosis involving the left  popliteal vein, left posterior tibial veins, and left peroneal veins.  - Findings consistent with age  indeterminate deep vein thrombosis  involving the left femoral vein.  ASSESSMENT and PLAN   Left leg DVT: The patient developed this DVT after long car rides, despite being on Xarelto.  She is being admitted to the hospital for IV heparin and conversion to Coumadin.  Ultrasound suggests that this is a femoral-popliteal and tibial vein DVT, not involving the common femoral or iliac veins.  In this situation, I would recommend leg elevation, compression, and anticoagulation.  If there were extension into the common femoral vein or iliac veins, I would consider percutaneous mechanical thrombectomy.  It does not appear that her DVT extends into these veins however I will get a formal ilio caval DVT study in the morning after she has been n.p.o.  Further recommendations  will be based on the study.  If she does not have evidence of iliofemoral DVT, she can be discharged home from my perspective once she is therapeutic on her anticoagulation.  I discussed that if she continues to have significant difficulty despite treatment, we could consider mechanical thrombectomy next week if she has not had good symptomatic improvement, however there may be only limited benefit with femoral-popliteal venous thrombectomy.  I am also ordering her 20-30 thigh-high compression stockings   Leia Alf, MD, FACS Vascular and Vein Specialists of [facility] [phone] Pager [phone]

## 2021-01-26 NOTE — Progress Notes (Signed)
Left lower extremity venous  has been completed. Refer to [facility] under chart review to view preliminary results. Results given to Roderic Palau, NP.   01/26/2021  9:48 AM Elenor Quinones, Bonnye Fava

## 2021-01-26 NOTE — ED Provider Notes (Signed)
[place] EMERGENCY DEPARTMENT Provider Note   CSN: [id number] Arrival date & time: 01/26/21  1026     History Chief Complaint  Patient presents with   Leg Swelling    Zanari A Tober is a 65 y.o. female with hx of Afib maintained on Xarelto who presents to the ED today with a 2 week history of left leg swelling and pain. She was seen and evaluated at the Afib clinic earlier today and talked with her care team about her leg swelling. Ultrasound was ordered and she was found to have a DVT and sent to the ED for further evaluation. She does mention several long drives to [place] after a couple family members have passed away. She has not missed any doses of her Xarelto and takes it every night. She denies any chest pain, shortness of breath, syncope, orthopnea, fever, chills, abdominal pain, or urinary complaints. She has been otherwise health. She does endorse some lightheadedness but this is chronic from her Afib and unchanged from previous episodes. Nothing seems to make her leg pain better or worse and she has not taken anything for the pain.   HPI     Past Medical History:  Diagnosis Date   Anxiety    Asthma    Atrial fibrillation ([place]) 11/2010   paroxysmal   Bradycardia    Depression    DJD (degenerative joint disease)    Cervical spine   Fibromyalgia    s/p rheumatology consultation/[facility]   Mild sleep apnea    Obesity    Osteoarthritis    Shoulders, hips,knees    Patient Active Problem List   Diagnosis Date Noted   Low back pain 11/24/2020   Osteoarthritis of left foot 10/06/2017   GERD (gastroesophageal reflux disease) 08/23/2017   Fibromyalgia 03/14/2015   Degenerative disc disease, cervical 03/14/2015   Primary osteoarthritis involving multiple joints 03/14/2015   Depression 02/18/2014   Asthma    PAF (paroxysmal atrial fibrillation) ([place]) 11/28/2010   Obesity 11/28/2010    Past Surgical History:  Procedure Laterality Date    ABLATION  08/2012   PVI by Dr [name]   ABLATION  11/13/2013   PVI by Dr [name]   ATRIAL FIBRILLATION ABLATION N/A 09/03/2012   Procedure: ATRIAL FIBRILLATION ABLATION;  Surgeon: Thompson Grayer, MD;  [facility] C[facility] LAB;  Service: Cardiovascular;  Laterality: N/A;   ATRIAL FIBRILLATION ABLATION N/A 11/13/2013   Procedure: ATRIAL FIBRILLATION ABLATION;  Surgeon: Coralyn Mark, MD;  Location: [place] CATH LAB;  Service: Cardiovascular;  Laterality: N/A;   CARDIOVERSION  03/13/2012   Procedure: CARDIOVERSION;  Surgeon: [name], MD;  Location: [place];  Service: Cardiovascular;  Laterality: N/A;   CARDIOVERSION N/A 07/26/2012   Procedure: CARDIOVERSION;  Surgeon: Thompson Grayer, MD;  Location: [place];  Service: Cardiovascular;  Laterality: N/A;   CARDIOVERSION N/A 02/20/2014   Procedure: CARDIOVERSION;  Surgeon: Larey Dresser, MD;  Location: [place];  Service: Cardiovascular;  Laterality: N/A;   CESAREAN SECTION     CHOLECYSTECTOMY     HERNIA REPAIR     KNEE SURGERY     neck tumor resection     benign   TEE WITHOUT CARDIOVERSION  03/13/2012   Procedure: TRANSESOPHAGEAL ECHOCARDIOGRAM (TEE);  Surgeon: [name], MD;  Location: [place];  Service: Cardiovascular;  Laterality: N/A;  Rm 2029   TEE WITHOUT CARDIOVERSION N/A 09/02/2012   Procedure: TRANSESOPHAGEAL ECHOCARDIOGRAM (TEE);  Surgeon: Josue Hector, MD;  Location: [place];  Service:  Cardiovascular;  Laterality: N/A;   TEE WITHOUT CARDIOVERSION N/A 11/13/2013   Procedure: TRANSESOPHAGEAL ECHOCARDIOGRAM (TEE);  Surgeon: [name], MD;  Location: [facility] ENDOSCOPY;  Service: Cardiovascular;  Laterality: N/A;     OB History   No obstetric history on file.     Family History  Problem Relation Age of Onset   Emphysema Mother    COPD Mother    Anxiety disorder Mother    Cancer Father        lung cancer; non-smoker   Heart disease Father 73       AMI   COPD Sister    Emphysema Brother    Cancer Brother    Cancer  Daughter    Cancer Sister        lung cancer   Heart disease Sister        AMI   Diabetes Other    Thyroid disease Other     Social History   Tobacco Use   Smoking status: Never   Smokeless tobacco: Never  Vaping Use   Vaping Use: Never used  Substance Use Topics   Alcohol use: Yes    Comment: Rarely- once every few months   Drug use: No    Home Medications Prior to Admission medications   Medication Sig Start Date End Date Taking? Authorizing Provider  acetaminophen (TYLENOL) 500 MG tablet Take by mouth as needed.     [provider]  KLOR-CON M20 20 MEQ tablet TAKE 2 TABLETS (40 MEQ TOTAL) 2 (TWO) TIMES DAILY BY MOUTH. 11/24/19   Sherran Needs, NP  LORazepam (ATIVAN) 1 MG tablet Take 1 mg by mouth as needed. 01/14/19   [provider]  meloxicam (MOBIC) 15 MG tablet Take 1 tablet (15 mg total) by mouth daily. 11/24/20   Rice, Resa Miner, MD  metoprolol succinate (TOPROL XL) 25 MG 24 hr tablet Take 0.5 tablets (12.5 mg total) by mouth daily. 01/20/20 01/26/21  Sherran Needs, NP  pantoprazole (PROTONIX) 40 MG tablet TAKE 1 TABLET BY MOUTH TWICE A DAY 08/01/19   Horald Pollen, MD  PROVENTIL [facility] 108 [phone] Base) MCG/ACT inhaler INHALE 2 PUFFS INTO THE LUNGS EVERY 6 HOURS AS NEEDED 02/16/16   Wardell Honour, MD  TIADYLT ER 240 MG 24 hr capsule TAKE 1 CAPSULE BY MOUTH EVERY DAY 09/27/20   Sherran Needs, NP  XARELTO 20 MG TABS tablet TAKE 1 TABLET EVERY DAY WITH SUPPER 10/27/19   Sherran Needs, NP    Allergies    Adhesive [tape]  Review of Systems   Review of Systems  Constitutional:  Negative for chills and fever.  Eyes:  Negative for visual disturbance.  Respiratory:  Negative for chest tightness and shortness of breath.   Cardiovascular:  Positive for leg swelling. Negative for chest pain.  Gastrointestinal:  Negative for abdominal distention and abdominal pain.  Genitourinary:  Negative for difficulty urinating.  Musculoskeletal:        Leg  pain.  Neurological:  Positive for light-headedness. Negative for dizziness and syncope.  All other systems reviewed and are negative.  Physical Exam Updated Vital Signs BP (!) 150/110   Pulse 89   Temp 97.8 F (36.6 C) (Oral)   Resp 18   Ht '5\' 1"'$  (1.549 m)   Wt 94.8 kg   SpO2 100%   BMI 39.49 kg/m   Physical Exam Constitutional:      Appearance: Normal appearance.  HENT:     Head: Normocephalic  and atraumatic.  Eyes:     General:        Right eye: No discharge.        Left eye: No discharge.  Cardiovascular:     Rate and Rhythm: Rhythm irregular.     Pulses:          Dorsalis pedis pulses are 2+ on the right side and 2+ on the left side.     Heart sounds: No murmur heard.    Comments: Positive homan's sign on the left.  Pulmonary:     Effort: Pulmonary effort is normal.     Breath sounds: No wheezing, rhonchi or rales.  Abdominal:     Palpations: Abdomen is soft.  Musculoskeletal:     Right lower leg: No edema.     Left lower leg: 1+ Pitting Edema present.  Skin:    General: Skin is warm and dry.     Comments: No evidence of phlegmasia cerulea dolens or cerulea alba  Neurological:     Mental Status: She is alert.  Psychiatric:        Mood and Affect: Mood normal.    ED Results / Procedures / Treatments   Labs (all labs ordered are listed, but only abnormal results are displayed) Labs Reviewed  COMPREHENSIVE METABOLIC PANEL - Abnormal; Notable for the following components:      Result Value   Alkaline Phosphatase 171 (*)    All other components within normal limits  RESP PANEL BY RT-PCR (FLU A&B, COVID) ARPGX2  CBC WITH DIFFERENTIAL/PLATELET    EKG None  Radiology VAS [place] LOWER EXTREMITY VENOUS (DVT)  Result Date: 01/26/2021  Lower Venous DVT Study Patient Name:  KIMYAH PEYSER  Date of Exam:   01/26/2021 Medical Rec #: [id number]          Accession #:    [id number] Date of Birth: 07/03/1955           Patient Gender: F Patient Age:   43 years Exam  Location:  [facility] Procedure:      VAS [place] LOWER EXTREMITY VENOUS (DVT) Referring Phys: Butch Penny CARROLL --------------------------------------------------------------------------------  Indications: Pain, and Swelling.  Anticoagulation: Xarelto. Comparison Study: No prior Performing Technologist: Oda Cogan RDMS, RVT  Examination Guidelines: A complete evaluation includes B-mode imaging, spectral Doppler, color Doppler, and power Doppler as needed of all accessible portions of each vessel. Bilateral testing is considered an integral part of a complete examination. Limited examinations for reoccurring indications may be performed as noted. The reflux portion of the exam is performed with the patient in reverse Trendelenburg.  +-----+---------------+---------+-----------+----------+--------------+ RIGHTCompressibilityPhasicitySpontaneityPropertiesThrombus Aging +-----+---------------+---------+-----------+----------+--------------+ CFV  Full           Yes      Yes                                 +-----+---------------+---------+-----------+----------+--------------+ SFJ  Full                                                        +-----+---------------+---------+-----------+----------+--------------+   +---------+---------------+---------+-----------+----------+-----------------+ LEFT     CompressibilityPhasicitySpontaneityPropertiesThrombus Aging    +---------+---------------+---------+-----------+----------+-----------------+ CFV      Full           Yes      Yes                                    +---------+---------------+---------+-----------+----------+-----------------+  SFJ      Full                                                           +---------+---------------+---------+-----------+----------+-----------------+ FV Prox  Full                                                            +---------+---------------+---------+-----------+----------+-----------------+ FV Mid   Full                                                           +---------+---------------+---------+-----------+----------+-----------------+ FV DistalPartial                                      Age Indeterminate +---------+---------------+---------+-----------+----------+-----------------+ PFV      Full                                                           +---------+---------------+---------+-----------+----------+-----------------+ POP      None                               dilated   Acute             +---------+---------------+---------+-----------+----------+-----------------+ PTV      None                               dilated   Acute             +---------+---------------+---------+-----------+----------+-----------------+ PERO     None                               dilated   Acute             +---------+---------------+---------+-----------+----------+-----------------+    Summary: RIGHT: - No evidence of common femoral vein obstruction.  LEFT: - Findings consistent with acute deep vein thrombosis involving the left popliteal vein, left posterior tibial veins, and left peroneal veins. - Findings consistent with age indeterminate deep vein thrombosis involving the left femoral vein.  *See table(s) above for measurements and observations.    Preliminary     Procedures Procedures   Medications Ordered in ED Medications  acetaminophen (TYLENOL) tablet 650 mg (has no administration in time range)    Or  acetaminophen (TYLENOL) suppository 650 mg (has no administration in time range)  fentaNYL (SUBLIMAZE) injection 25 mcg (has no administration in time range)  naloxone (NARCAN) injection 0.4 mg (has no administration in time range)    ED Course  I have reviewed the  triage vital signs and the nursing notes.  Pertinent labs & imaging results that were available  during my care of the patient were reviewed by me and considered in my medical decision making (see chart for details).   Clinical Course as of 01/26/21 1950  Wed Jan 26, 2021  1921 Spoke with Dr. Trula Slade who asks for admit with heparin, medicine admission.  Vascular will consult on the patient for further evaluation and management and potential intervention.  Patient should be n.p.o. after midnight. [AH]    Clinical Course User Index [AH] Margarita Mail, PA-C   MDM Rules/Calculators/A&P                           Yvana Zajac is a 65 year old female with history of atrial fibrillation maintained on Xarelto who presents to the ED with a known left leg DVT on ultrasound done prior to arrival. On exam, there does not appear to be any coinciding pathology including cellulitis, phlegmasia alba dolens, phlegmasia cerulea dolens, thrombophlebitis, arterial thrombus, or infection.   Plan of care discussed with the patient and she is in agreement.  Heparin was ordered and patient was admitted to the medicine service with vascular on board. Final diagnoses:  Deep vein thrombosis (DVT) of femoral vein of left lower extremity, unspecified chronicity [facility])    Rx / DC Orders ED Discharge Orders     None        Hendricks Limes, PA-C 01/26/21 2007    Lennice Sites, DO 01/26/21 2358

## 2021-01-26 NOTE — ED Notes (Signed)
I introduced myself to pt. Pain is better, down to a 4/10. Resting in bed. AxO x4. GCS 15. Denies further needs.

## 2021-01-26 NOTE — Addendum Note (Signed)
Encounter addended by: Sherran Needs, NP on: 01/26/2021 10:42 AM  Actions taken: Clinical Note Signed

## 2021-01-26 NOTE — ED Notes (Signed)
Updated contact info.

## 2021-01-26 NOTE — Progress Notes (Signed)
ANTICOAGULATION CONSULT NOTE - Initial Consult  Pharmacy Consult for heparin Indication:  Acute DVT  Allergies  Allergen Reactions   Adhesive [Tape] Itching and Rash    Paper tape is better but best to use no adhesive if possible    Patient Measurements: Height: '5\' 1"'$  (154.9 cm) Weight: 94.8 kg (209 lb) IBW/kg (Calculated) : 47.8 Heparin Dosing Weight: 94 kg   Vital Signs: Temp: 97.8 F (36.6 C) (08/17 1420) Temp Source: Oral (08/17 1420) BP: 150/110 (08/17 1905) Pulse Rate: 89 (08/17 1905)  Labs: Recent Labs    01/26/21 1111  HGB 13.1  HCT 41.6  PLT 295  CREATININE 0.81    Estimated Creatinine Clearance: 70.9 mL/min (by C-G formula based on SCr of 0.81 mg/dL).   Medical History: Past Medical History:  Diagnosis Date   Anxiety    Asthma    Atrial fibrillation ([place]) 11/2010   paroxysmal   Bradycardia    Depression    DJD (degenerative joint disease)    Cervical spine   Fibromyalgia    s/p rheumatology consultation/Zeminsky   Mild sleep apnea    Obesity    Osteoarthritis    Shoulders, hips,knees    Medications:  (Not in a hospital admission)   Assessment: 67 YOF on Xarelto PTA for AFib now with acute LLE DVT. Pharmacy consulted to transition to IV heparin.   H/H and Plt wnl, SCr wnl  Goal of Therapy:  Heparin level 0.3-0.7 units/ml aPTT 66-102 seconds Monitor platelets by anticoagulation protocol: Yes   Plan:  -Start heparin at 1550 units/hr  -F/u 6 hr pTT/HL -Monitor daily HL, CBC and s/s of bleeding   Albertina Parr, PharmD., BCPS, BCCCP Clinical Pharmacist Please refer to [facility] for unit-specific pharmacist

## 2021-01-26 NOTE — Progress Notes (Addendum)
Primary Care Physician: Horald Pollen, MD Referring Physician: Dr. [name] is a 65 y.o. female with a h/o persistent afib s/p ablation x2  that failed Tikosyn back in May, after being seen by Dr. [name] MAy 2020  and was started on amiodarone 200 mg bid to f/u in the afib clinic in  3 weeks. Pt failed to come to clinic and had to refuse her refills for her to agree to come in today. She has been taking 200 mg bid of amiodarone since May.  She last had a successful cardioversion early May.  Ekg today shows afib at 112 bpm, however, she feels that she is not in afib all the time as she has some days she reports HR's in the 60-70's range. She states that she feels well. She will lower amiodarone to one tab, 200 mg a day. She feels that she has not been taking her metoprolol as she felt her HR was in control. No missed xarelto for at least 3 weeks.  Pt is back in afib clinic, 11/18 after wearing a zio patch was found  to being out of rhythm  51% while wearing the monitor, However, she is in SR today. She is on amiodarone 200 mg daily for several weeks. Her Alk phos and TSH were elevated on last visit and will repeat  labs today .    F/u in afib clinic 06/11/19. She is in SR but has developed a long  first degree AV block and also has a known RBBB. Continues on 200 mg amiodarone daily as well as 12.5 mg metoprolol succinate bid.   Discussed with Dr. [name], She has not noted any afib. He suggested to stop BB and decrease amiodarone to 100 mg daily.CHA2DS2VASc score of 2.   F/u in afib clinic, 07/21/19. She has now been off amiodarone for 4-6 weeks as it was causing EKG changes as well as elevated TSH/liver enzymes. Now in the office, she is in Sinus brady with first degree AV block no longer noted and and IRBBB. She feels well.   F/u in afib clinic, 8/10. She  has noted more palpitations. She is in SR with PAC's. Will restart low dose  BB. Continues  on xarelto 20 mg daily for a  CHA2DS2VASc score of 2.   F/u in the afib clinic, 07/21/20. She  was in a rush this am and forgot to take am BB. She  is in a sinus tach at 120 bpm. She has felt dizzy at times. No blood work x one year. She remains on xarelto with a CHA2DS2VASc score of 2.   F/u in afib clinic, 01/26/21. EKG shows afib with controlled v rate. She is not aware of being in afib. She does  not want to change afib strategy. She had been offered a repeat ablation by Dr. [name] I the past and she deferred.  She is c/o of painful L calf and LLE. She had been compliant with eliquis but had 2 8 hour trips to [place] 2/2 to family members dying in the last few weeks. She has noted a change in her leg  x 2 weeks.   Today, she denies symptoms of palpitations, chest pain, shortness of breath, orthopnea, PND, lower extremity edema, dizziness, presyncope, syncope, or neurologic sequela. The patient is tolerating medications without difficulties and is otherwise without complaint today.   Past Medical History:  Diagnosis Date   Anxiety    Asthma  Atrial fibrillation ([place]) 11/2010   paroxysmal   Bradycardia    Depression    DJD (degenerative joint disease)    Cervical spine   Fibromyalgia    s/p rheumatology consultation/[facility]   Mild sleep apnea    Obesity    Osteoarthritis    Shoulders, hips,knees   Past Surgical History:  Procedure Laterality Date   ABLATION  08/2012   PVI by Dr [name]   ABLATION  11/13/2013   PVI by Dr [name]   ATRIAL FIBRILLATION ABLATION N/A 09/03/2012   Procedure: ATRIAL FIBRILLATION ABLATION;  Surgeon: Thompson Grayer, MD;  Location: [facility] CATH LAB;  Service: Cardiovascular;  Laterality: N/A;   ATRIAL FIBRILLATION ABLATION N/A 11/13/2013   Procedure: ATRIAL FIBRILLATION ABLATION;  Surgeon: Coralyn Mark, MD;  Location: [place] CATH LAB;  Service: Cardiovascular;  Laterality: N/A;   CARDIOVERSION  03/13/2012   Procedure: CARDIOVERSION;  Surgeon: [name], MD;  Location: [place];  Service:  Cardiovascular;  Laterality: N/A;   CARDIOVERSION N/A 07/26/2012   Procedure: CARDIOVERSION;  Surgeon: Thompson Grayer, MD;  Location: [facility];  Service: Cardiovascular;  Laterality: N/A;   CARDIOVERSION N/A 02/20/2014   Procedure: CARDIOVERSION;  Surgeon: Larey Dresser, MD;  Location: [place];  Service: Cardiovascular;  Laterality: N/A;   CESAREAN SECTION     CHOLECYSTECTOMY     HERNIA REPAIR     KNEE SURGERY     neck tumor resection     benign   TEE WITHOUT CARDIOVERSION  03/13/2012   Procedure: TRANSESOPHAGEAL ECHOCARDIOGRAM (TEE);  Surgeon: [name], MD;  Location: [place];  Service: Cardiovascular;  Laterality: N/A;  Rm 2029   TEE WITHOUT CARDIOVERSION N/A 09/02/2012   Procedure: TRANSESOPHAGEAL ECHOCARDIOGRAM (TEE);  Surgeon: Josue Hector, MD;  Location: [place];  Service: Cardiovascular;  Laterality: N/A;   TEE WITHOUT CARDIOVERSION N/A 11/13/2013   Procedure: TRANSESOPHAGEAL ECHOCARDIOGRAM (TEE);  Surgeon: [name], MD;  Location[facility];  Service: Cardiovascular;  Laterality: N/A;    Current Outpatient Medications  Medication Sig Dispense Refill   acetaminophen (TYLENOL) 500 MG tablet Take by mouth as needed.      KLOR-CON M20 20 MEQ tablet TAKE 2 TABLETS (40 MEQ TOTAL) 2 (TWO) TIMES DAILY BY MOUTH. 120 tablet 8   LORazepam (ATIVAN) 1 MG tablet Take 1 mg by mouth as needed.     meloxicam (MOBIC) 15 MG tablet Take 1 tablet (15 mg total) by mouth daily. 30 tablet 5   metoprolol succinate (TOPROL XL) 25 MG 24 hr tablet Take 0.5 tablets (12.5 mg total) by mouth daily. 30 tablet 6   pantoprazole (PROTONIX) 40 MG tablet TAKE 1 TABLET BY MOUTH TWICE A DAY 180 tablet 0   PROVENTIL HFA 108 (90 Base) MCG/ACT inhaler INHALE 2 PUFFS INTO THE LUNGS EVERY 6 HOURS AS NEEDED 6.7 g 0   TIADYLT ER 240 MG 24 hr capsule TAKE 1 CAPSULE BY MOUTH EVERY DAY 90 capsule 1   XARELTO 20 MG TABS tablet TAKE 1 TABLET EVERY DAY WITH SUPPER 90 tablet 2   No current  facility-administered medications for this encounter.    Allergies  Allergen Reactions   Adhesive [Tape] Itching and Rash    Paper tape is better but best to use no adhesive if possible    Social History   Socioeconomic History   Marital status: Widowed    Spouse name: Not on file   Number of children: 3   Years of education: Not on file  Highest education level: Not on file  Occupational History   Not on file  Tobacco Use   Smoking status: Never   Smokeless tobacco: Never  Vaping Use   Vaping Use: Never used  Substance and Sexual Activity   Alcohol use: Yes    Comment: Rarely- once every few months   Drug use: No   Sexual activity: Never  Other Topics Concern   Not on file  Social History Narrative   Marital status: widowed since 01/2014 of melanoma; not dating.       Children:  3 children (17,30, 32) and 4 grandchildren and 1 gg. 1 adopted child.       Lives with son.  Lives in [place].        Employment:  Unemployed.       Tobacco: none      Alcohol: none      Drugs: none      Exercise: none   Social Determinants of Radio broadcast assistant Strain: Not on file  Food Insecurity: Not on file  Transportation Needs: Not on file  Physical Activity: Not on file  Stress: Not on file  Social Connections: Not on file  Intimate Partner Violence: Not on file    Family History  Problem Relation Age of Onset   Emphysema Mother    COPD Mother    Anxiety disorder Mother    Cancer Father        lung cancer; non-smoker   Heart disease Father 20       AMI   COPD Sister    Emphysema Brother    Cancer Brother    Cancer Daughter    Cancer Sister        lung cancer   Heart disease Sister        AMI   Diabetes Other    Thyroid disease Other     ROS- All systems are reviewed and negative except as per the HPI above  Physical Exam: There were no vitals filed for this visit.  Wt Readings from Last 3 Encounters:  11/24/20 94.2 kg  10/07/20 94.8 kg   08/02/20 97.1 kg    Labs: Lab Results  Component Value Date   NA 139 11/24/2020   K 4.6 11/24/2020   CL 103 11/24/2020   CO2 26 11/24/2020   GLUCOSE 93 11/24/2020   BUN 12 11/24/2020   CREATININE 0.82 11/24/2020   CALCIUM 9.4 11/24/2020   MG 2.2 10/24/2018   Lab Results  Component Value Date   INR 1.05 07/25/2012   Lab Results  Component Value Date   CHOL 169 03/14/2015   HDL 71 03/14/2015   LDLCALC 79 03/14/2015   TRIG 94 03/14/2015     GEN- The patient is well appearing, alert and oriented x 3 today.   Head- normocephalic, atraumatic Eyes-  Sclera clear, conjunctiva pink Ears- hearing intact Oropharynx- clear Neck- supple, no JVP Lymph- no cervical lymphadenopathy Lungs- Clear to ausculation bilaterally, normal work of breathing Heart - irregular rate and rhythm, no murmurs, rubs or gallops, PMI not laterally displaced GI- soft, NT, ND, + BS Extremities- no clubbing, cyanosis, 1+ edema of left LLE, mildly pink lower shin area, sore to calf area MS- no significant deformity or atrophy Skin- no rash or lesion Psych- euthymic mood, full affect Neuro- strength and sensation are intact  EKG- afib at 88 bpm, qrs int 90 ms, qtc 459  Epic records reviewed   Assessment and Plan: 1. Afib  Reate controlled afib today She is unaware  She does not want to change approach She usually is paroxysmal in the past  Previously  treated with Tikosyn and amiodarone  Continue metoprolol succinate 12.5 mg daily   Continue Cardizem 240 mg daily  She does not to go down repeat ablation path  2.CHA2DS2VASc score is 2 Continue Xarelto 20 mg a day States no missed doses   3. LLE with tenderness in calf Has been complaint with xarelto Had covid 2 months ago and 2 long car trips in the last 2-4 weeks U/S of LE asap to  assess for DVT  Update echo    F/u  in 6 months  Addendum: 01/26/21 10:15 am- LLE U/S showed findings consistent with acute deep vein thrombosis involving  the left  popliteal vein, left posterior tibial veins, and left peroneal veins.  - Findings consistent with age indeterminate deep vein thrombosis  involving the left femoral vein.   I discussed finidngs of U/S with Dr.Allred. pt states no xarelto missed doses. Dr. [name] feels she need to be admitted for heparin drip then transition to coumadin and vascular consult with Dr. Servando Snare. ER charge nurse notified and to the ER by wheelchair.

## 2021-01-26 NOTE — H&P (Signed)
History and Physical    PLEASE NOTE THAT DRAGON DICTATION SOFTWARE WAS USED IN THE CONSTRUCTION OF THIS NOTE.   Angela Cummings [id number] DOB: 09-29-1955 DOA: 01/26/2021  PCP: Horald Pollen, MD Patient coming from: home   I have personally briefly reviewed patient's old medical records in [place]  Chief Complaint: left leg pain  HPI: Angela Cummings is a 65 y.o. female with medical history significant for paroxysmal atrial fibrillation chronically anticoagulated on Xarelto, mild intermittent asthma, GERD, who is admitted to [facility] on 01/26/2021 with acute DVT of the left lower extremity in spite of compliance with home Xarelto after presenting from home to [facility] ED complaining of left lower extremity pain.   The patient reports 2 weeks of progressive left calf tenderness associated with new onset swelling.  Denies any associated overt lower extremity erythema, and also denies any associated acute focal weakness, numbness, or paresthesias.  She to car rides to [place] over the last month, with initial heart rate occurring around July 12 in the subsequent car ride occurring around July 26.  The patient notes that both of these car rides lasted approximately 9 hours, with the patient not stopping for any breaks throughout that process.  Otherwise, denies any recent periods of prolonged diminished ambulatory status.  She also denies any recent trauma or surgical procedures.  No known history of underlying malignancy, and the patient reports that she is up-to-date on her screening colonoscopy as well as mammograms, and is scheduled for her next such round of screening colonoscopy/mammogram later this year.  No personal or family history of prior DVT/PE, no the patient confirms good compliance with her home Xarelto, which she has been taking over the course of the last 3 years for thromboembolic prophylaxis in the setting of a history of paroxysmal atrial fibrillation.   She conveys that since the initiation of her Xarelto 3 years ago, that she does not believe that she has missed a single dose of this anticoagulant.   She denies any associated chest pain, palpitations, shortness breath, diaphoresis, nausea, vomiting, dizziness, presyncope, syncope, or hemoptysis.  She also denies any recent subjective fever, chills, rigors, or generalized myalgias.  No recent headache, neck stiffness, rhinitis, rhinorrhea, sore throat, wheezing, abdominal pain, diarrhea, or rash.  No known recent COVID-19 exposures.  Denies any recent orthopnea or PND.  In the setting of the above symptoms, the patient's PCP ordered venous Doppler of the left lower extremity, which was completed as an outpatient earlier today and demonstrated evidence of acute DVT involving the left popliteal vein, left posterior tibial vein, and left peroneal vein as well as showing age-indeterminate DVT involving the left femoral vein, while right lower extremity venous Doppler showed no evidence of acute DVT.  In the setting of the above results, patient's PCP recommended that she present to [facility] emergency department for further evaluation of acute left lower extremity DVT that occurred in spite of outstanding compliance with her chronic anticoagulation with Xarelto.    ED Course:  Vital signs in the ED were notable for the following:  - Temperature max 97.8, heart rate 7789; blood pressure 129/83 137/74; respiratory rate 14-18, oxygen saturation 100% on room air.  Labs were notable for the following: BMP was notable for the following: Sodium 138, creatinine 0.81.  CBC notable for white blood cell count 6100, hemoglobin 13.1, and platelet count 295.  Screening nasopharyngeal COVID-19 PCR checked in the ED today was found to be negative.  EKG shows atrial fibrillation with ventricular rate 88, QRS 90, QTc 459, nonspecific T wave inversion in V1/V2 and evidence of ST changes, including no evidence of ST  elevation.  Case was discussed with the on-call vascular surgeon, Dr. Trula Slade, who will formally consult and has evaluated the patient in the ED this evening.  Dr. Trula Slade recommends initiation of heparin drip and will pursue additional imaging of the proximal left lower extremity into the pelvis to determine the extent of her clot burden to guide need for thrombectomy versus exclusively medical management. Dr. Trula Slade has asked the patient be made n.p.o. after midnight.  While in the ED, the following were administered: Heparin drip initiated per inpatient pharmacy consult.     Review of Systems: As per HPI otherwise 10 point review of systems negative.   Past Medical History:  Diagnosis Date   Anxiety    Asthma    Atrial fibrillation ([place]) 11/2010   paroxysmal   Bradycardia    Depression    DJD (degenerative joint disease)    Cervical spine   Fibromyalgia    s/p rheumatology consultation/Zeminsky   Mild sleep apnea    Obesity    Osteoarthritis    Shoulders, hips,knees    Past Surgical History:  Procedure Laterality Date   ABLATION  08/2012   PVI by Dr [name]   ABLATION  11/13/2013   PVI by Dr [name]   ATRIAL FIBRILLATION ABLATION N/A 09/03/2012   Procedure: ATRIAL FIBRILLATION ABLATION;  Surgeon: Thompson Grayer, MD;  Location: [facility] CATH LAB;  Service: Cardiovascular;  Laterality: N/A;   ATRIAL FIBRILLATION ABLATION N/A 11/13/2013   Procedure: ATRIAL FIBRILLATION ABLATION;  Surgeon: Coralyn Mark, MD;  Location: [place] CATH LAB;  Service: Cardiovascular;  Laterality: N/A;   CARDIOVERSION  03/13/2012   Procedure: CARDIOVERSION;  Surgeon: [name], MD;  Location: [place];  Service: Cardiovascular;  Laterality: N/A;   CARDIOVERSION N/A 07/26/2012   Procedure: CARDIOVERSION;  Surgeon: Thompson Grayer, MD;  Location: [place];  Service: Cardiovascular;  Laterality: N/A;   CARDIOVERSION N/A 02/20/2014   Procedure: CARDIOVERSION;  Surgeon: Larey Dresser, MD;  Location: [place];   Service: Cardiovascular;  Laterality: N/A;   CESAREAN SECTION     CHOLECYSTECTOMY     HERNIA REPAIR     KNEE SURGERY     neck tumor resection     benign   TEE WITHOUT CARDIOVERSION  03/13/2012   Procedure: TRANSESOPHAGEAL ECHOCARDIOGRAM (TEE);  Surgeon: [name], MD;  Location: [place];  Service: Cardiovascular;  Laterality: N/A;  Rm 2029   TEE WITHOUT CARDIOVERSION N/A 09/02/2012   Procedure: TRANSESOPHAGEAL ECHOCARDIOGRAM (TEE);  Surgeon: Josue Hector, MD;  Location: [place];  Service: Cardiovascular;  Laterality: N/A;   TEE WITHOUT CARDIOVERSION N/A 11/13/2013   Procedure: TRANSESOPHAGEAL ECHOCARDIOGRAM (TEE);  Surgeon: [name], MD;  Location: [place];  Service: Cardiovascular;  Laterality: N/A;    Social History:  reports that she has never smoked. She has never used smokeless tobacco. She reports current alcohol use. She reports that she does not use drugs.   Allergies  Allergen Reactions   Adhesive [Tape] Itching and Rash    Paper tape is better but best to use no adhesive if possible    Family History  Problem Relation Age of Onset   Emphysema Mother    COPD Mother    Anxiety disorder Mother    Cancer Father        lung cancer; non-smoker  Heart disease Father 79       AMI   COPD Sister    Emphysema Brother    Cancer Brother    Cancer Daughter    Cancer Sister        lung cancer   Heart disease Sister        AMI   Diabetes Other    Thyroid disease Other     Family history reviewed and not pertinent    Prior to Admission medications   Medication Sig Start Date End Date Taking? Authorizing Provider  acetaminophen (TYLENOL) 500 MG tablet Take by mouth as needed.     [provider]  KLOR-CON M20 20 MEQ tablet TAKE 2 TABLETS (40 MEQ TOTAL) 2 (TWO) TIMES DAILY BY MOUTH. 11/24/19   Sherran Needs, NP  LORazepam (ATIVAN) 1 MG tablet Take 1 mg by mouth as needed. 01/14/19   [provider]  meloxicam (MOBIC) 15 MG  tablet Take 1 tablet (15 mg total) by mouth daily. 11/24/20   Rice, Resa Miner, MD  metoprolol succinate (TOPROL XL) 25 MG 24 hr tablet Take 0.5 tablets (12.5 mg total) by mouth daily. 01/20/20 01/26/21  Sherran Needs, NP  pantoprazole (PROTONIX) 40 MG tablet TAKE 1 TABLET BY MOUTH TWICE A DAY 08/01/19   Horald Pollen, MD  PROVENTIL HFA 108 [phone] Base) MCG/ACT inhaler INHALE 2 PUFFS INTO THE LUNGS EVERY 6 HOURS AS NEEDED 02/16/16   Wardell Honour, MD  TIADYLT ER 240 MG 24 hr capsule TAKE 1 CAPSULE BY MOUTH EVERY DAY 09/27/20   Sherran Needs, NP  XARELTO 20 MG TABS tablet TAKE 1 TABLET EVERY DAY WITH SUPPER 10/27/19   Sherran Needs, NP     Objective    Physical Exam: Vitals:   01/26/21 1106 01/26/21 1420 01/26/21 1723 01/26/21 1905  BP:  137/74 133/70 (!) 150/110  Pulse:  83 98 89  Resp:  '18 14 18  '$ Temp:  97.8 F (36.6 C)    TempSrc:  Oral    SpO2:  100% 100% 100%  Weight: 94.8 kg     Height: '5\' 1"'$  (1.549 m)       General: appears to be stated age; alert, oriented Skin: warm, dry, no rash Head:  AT/[place] Mouth:  Oral mucosa membranes appear moist, normal dentition Neck: supple; trachea midline Heart:  RRR; did not appreciate any M/R/G Lungs: CTAB, did not appreciate any wheezes, rales, or rhonchi Abdomen: + BS; soft, ND, NT Vascular: 2+ pedal pulses b/l; 2+ radial pulses b/l Extremities: non-pitting edema of the LLE, most prominent distal to the left knee ; no muscle wasting Neuro: strength and sensation intact in upper and lower extremities b/l   Labs on Admission: I have personally reviewed following labs and imaging studies  CBC: Recent Labs  Lab 01/26/21 1111  WBC 6.1  NEUTROABS 3.6  HGB 13.1  HCT 41.6  MCV 85.8  PLT [id number]   Basic Metabolic Panel: Recent Labs  Lab 01/26/21 1111  NA 138  K 4.1  CL 103  CO2 27  GLUCOSE 98  BUN 10  CREATININE 0.81  CALCIUM 9.4   GFR: Estimated Creatinine Clearance: 70.9 mL/min (by C-G formula based on SCr of  0.81 mg/dL). Liver Function Tests: Recent Labs  Lab 01/26/21 1111  AST 21  ALT 18  ALKPHOS 171*  BILITOT 0.4  PROT 7.0  ALBUMIN 3.7   No results for input(s): LIPASE, AMYLASE in the last 168 hours. No results  for input(s): AMMONIA in the last 168 hours. Coagulation Profile: No results for input(s): INR, PROTIME in the last 168 hours. Cardiac Enzymes: No results for input(s): CKTOTAL, CKMB, CKMBINDEX, TROPONINI in the last 168 hours. BNP (last 3 results) No results for input(s): PROBNP in the last 8760 hours. HbA1C: No results for input(s): HGBA1C in the last 72 hours. CBG: No results for input(s): GLUCAP in the last 168 hours. Lipid Profile: No results for input(s): CHOL, HDL, LDLCALC, TRIG, CHOLHDL, LDLDIRECT in the last 72 hours. Thyroid Function Tests: No results for input(s): TSH, T4TOTAL, FREET4, T3FREE, THYROIDAB in the last 72 hours. Anemia Panel: No results for input(s): VITAMINB12, FOLATE, FERRITIN, TIBC, IRON, RETICCTPCT in the last 72 hours. Urine analysis:    Component Value Date/Time   COLORURINE YELLOW 07/26/2012 0124   APPEARANCEUR CLEAR 07/26/2012 0124   LABSPEC 1.010 07/26/2012 0124   PHURINE 6.0 07/26/2012 0124   GLUCOSEU NEGATIVE 07/26/2012 0124   HGBUR SMALL (A) 07/26/2012 0124   BILIRUBINUR neg 05/26/2014 1016   KETONESUR NEGATIVE 07/26/2012 0124   PROTEINUR neg 05/26/2014 1016   PROTEINUR NEGATIVE 07/26/2012 0124   UROBILINOGEN 0.2 05/26/2014 1016   UROBILINOGEN 1.0 07/26/2012 0124   NITRITE .neg 05/26/2014 1016   NITRITE NEGATIVE 07/26/2012 0124   LEUKOCYTESUR Negative 05/26/2014 1016    Radiological Exams on Admission: VAS [place] LOWER EXTREMITY VENOUS (DVT)  Result Date: 01/26/2021  Lower Venous DVT Study Patient Name:  SIMRIN GLASSBERG  Date of Exam:   01/26/2021 Medical Rec #: [id number]          Accession #:    [id number] Date of Birth: [birth year]           Patient Gender: F Patient Age:   32 years Exam [facility]  Procedure:      VAS [place] LOWER EXTREMITY VENOUS (DVT) Referring Phys: Butch Penny CARROLL --------------------------------------------------------------------------------  Indications: Pain, and Swelling.  Anticoagulation: Xarelto. Comparison Study: No prior Performing Technologist: Oda Cogan RDMS, RVT  Examination Guidelines: A complete evaluation includes B-mode imaging, spectral Doppler, color Doppler, and power Doppler as needed of all accessible portions of each vessel. Bilateral testing is considered an integral part of a complete examination. Limited examinations for reoccurring indications may be performed as noted. The reflux portion of the exam is performed with the patient in reverse Trendelenburg.  +-----+---------------+---------+-----------+----------+--------------+ RIGHTCompressibilityPhasicitySpontaneityPropertiesThrombus Aging +-----+---------------+---------+-----------+----------+--------------+ CFV  Full           Yes      Yes                                 +-----+---------------+---------+-----------+----------+--------------+ SFJ  Full                                                        +-----+---------------+---------+-----------+----------+--------------+   +---------+---------------+---------+-----------+----------+-----------------+ LEFT     CompressibilityPhasicitySpontaneityPropertiesThrombus Aging    +---------+---------------+---------+-----------+----------+-----------------+ CFV      Full           Yes      Yes                                    +---------+---------------+---------+-----------+----------+-----------------+ SFJ      Full                                                           +---------+---------------+---------+-----------+----------+-----------------+  FV Prox  Full                                                           +---------+---------------+---------+-----------+----------+-----------------+ FV Mid    Full                                                           +---------+---------------+---------+-----------+----------+-----------------+ FV DistalPartial                                      Age Indeterminate +---------+---------------+---------+-----------+----------+-----------------+ PFV      Full                                                           +---------+---------------+---------+-----------+----------+-----------------+ POP      None                               dilated   Acute             +---------+---------------+---------+-----------+----------+-----------------+ PTV      None                               dilated   Acute             +---------+---------------+---------+-----------+----------+-----------------+ PERO     None                               dilated   Acute             +---------+---------------+---------+-----------+----------+-----------------+    Summary: RIGHT: - No evidence of common femoral vein obstruction.  LEFT: - Findings consistent with acute deep vein thrombosis involving the left popliteal vein, left posterior tibial veins, and left peroneal veins. - Findings consistent with age indeterminate deep vein thrombosis involving the left femoral vein.  *See table(s) above for measurements and observations.    Preliminary      EKG: Independently reviewed, with result as described above.    Assessment/Plan   Angela Cummings is a 65 y.o. female with medical history significant for paroxysmal atrial fibrillation chronically anticoagulated on Xarelto, mild intermittent asthma, GERD, who is admitted to [facility] on 01/26/2021 with acute DVT of the left lower extremity in spite of compliance with home Xarelto after presenting from home to [facility] ED complaining of left lower extremity pain.    Principal Problem:   Acute deep vein thrombosis (DVT) (HCC) Active Problems:   PAF (paroxysmal atrial fibrillation) (HCC)    Asthma   GERD (gastroesophageal reflux disease)   GAD (generalized anxiety disorder)     #) Acute left lower extremity DVT: In the setting of presenting 2 weeks of progressive left lower extremity swelling  and calf tenderness, venous ultrasound of the left lower extremity performed as an outpatient earlier today showed evidence of acute DVT involving the left popliteal vein, left posterior tibial vein, left peroneal vein as well as age-indeterminate DVT involving the left femoral vein, without clinical evidence to suggest compartment syndrome or neurovascular compromise at this time. Case was discussed with the on-call vascular surgeon, Dr. Trula Slade, who will formally consult and has evaluated the patient in the ED this evening.  Dr. Trula Slade recommends initiation of heparin drip and will pursue additional imaging of the proximal left lower extremity into the pelvis to determine the extent of her clot burden to guide need for thrombectomy versus exclusively medical management. Dr. Trula Slade has asked the patient be made n.p.o. after midnight.  Of note, while this acute DVT appears to have occurred in the context of provoking factors associated with hypercoagulability in the form of multiple recent prolonged drives, as further detailed above, the presenting acute DVT occurred in spite of patient's report of outstanding compliance with her chronic anticoagulation on Xarelto, which she has been taking for over 3 years without any reported missed doses over that timeframe.  Given this degree of compliance, will consider presentation to be consistent with failed anticoagulation on Xarelto, prompting consideration for pursuing a different means of anticoagulation on a chronic basis moving forward, including the possibility of transitioning to warfarin.    Plan: Vascular surgery formally consulted, as above.  Continue heparin drip initiated in the ED this evening.  Hold home Xarelto for now.  N.p.o. at midnight per  vascular surgery request.  Serial neurovascular checks of been ordered.  As needed IV fentanyl.      #) Paroxysmal atrial fibrillation: Documented history of such. In the setting of a CHA2DS2-VASc score of 2, there is an indication for the patient to be on chronic anticoagulation for thromboembolic prophylaxis. Consistent with this, the patient has been chronically anticoagulated on Xarelto, as further described above.  However, in the setting of presenting extensive acute DVT involving the left lower extremity in spite of reported outstanding compliance with long-term use of Xarelto, will consider this to be consistent with failure of outpatient anticoagulation via Xarelto, prompting likely transition to an new pharmacologic agent for this of ensuing chronic anticoagulation as an outpatient, as further detailed above.  Home AV nodal blocking regimen: Toprol-XL as well as long-acting diltiazem.  Presenting EKG suggestive of rate controlled atrial fibrillation.    Plan: monitor strict I's & O's and daily weights. Repeat BMP and CBC in the morning. Check serum magnesium level. Continue home AV nodal blocking regimen, as above.  Currently holding home Xarelto in the setting of heparin drip has been initiated as management of acute left lower extremity DVT, as above.      #) Generalized anxiety disorder: Documented history of such: On as needed Ativan as an outpatient, in the absence of any scheduled SSRI or SNRI.  Plan: Continue home as needed Ativan.       #) Mild intermittent asthma: Documented history of such, with outpatient respiratory regimen.  He consists of as needed albuterol inhaler.  No evidence to suggest acute asthma exacerbation at this time.  The patient confirms that she is a lifelong non-smoker.  Plan: Add on serum magnesium level.  Continue as needed albuterol inhaler.       #) GERD: On Protonix on a twice daily basis as an outpatient.  In the setting of plan for  n.p.o. at midnight, at the request  requested vascular surgery, will order Protonix via IV route until no longer n.p.o.  Plan: Continue outpatient Protonix, but via IV route in the setting of impending n.p.o. status, as further detailed above.     DVT prophylaxis: Heparin drip Code Status: Full code Family Communication: none Disposition Plan: Per Rounding Team Consults called: On-call vascular surgery, Dr. Trula Slade, consulted, as further described above;   Admission status: Inpatient;     Of note, this patient was added by me to the following Admit List/Treatment Team: [email or web address].      PLEASE NOTE THAT DRAGON DICTATION SOFTWARE WAS USED IN THE CONSTRUCTION OF THIS NOTE.   [place] [facility] Pager [phone] From [place]  Otherwise, please contact night-coverage  [email or web address] Password [facility]   01/26/2021, 8:04 PM

## 2021-01-26 NOTE — ED Notes (Signed)
Pt daughter called for updated, I explained I will notify nurse she called ,as shes not listed on as person of contact, so it would be up to pt on if any information is released and given to her

## 2021-01-26 NOTE — ED Triage Notes (Signed)
Pt reports left leg swelling, had vas US done at a fib clinic that was + for DVT. Pt denies sob or chest pain. Pt already takes xarelto for her a fib.

## 2021-01-27 ENCOUNTER — Encounter (HOSPITAL_COMMUNITY)
Admission: EM | Disposition: A | Discharge status: Home / Self Care | Payer: Self-pay | Source: Home / Self Care | Attending: Family Medicine

## 2021-01-27 ENCOUNTER — Encounter (HOSPITAL_COMMUNITY): Payer: Self-pay | Admitting: Internal Medicine

## 2021-01-27 ENCOUNTER — Inpatient Hospital Stay (HOSPITAL_COMMUNITY): Payer: Medicare Other

## 2021-01-27 DIAGNOSIS — I480 Paroxysmal atrial fibrillation: Secondary | ICD-10-CM

## 2021-01-27 DIAGNOSIS — I82409 Acute embolism and thrombosis of unspecified deep veins of unspecified lower extremity: Secondary | ICD-10-CM | POA: Diagnosis not present

## 2021-01-27 DIAGNOSIS — I82402 Acute embolism and thrombosis of unspecified deep veins of left lower extremity: Secondary | ICD-10-CM

## 2021-01-27 DIAGNOSIS — I824Y2 Acute embolism and thrombosis of unspecified deep veins of left proximal lower extremity: Secondary | ICD-10-CM

## 2021-01-27 DIAGNOSIS — F411 Generalized anxiety disorder: Secondary | ICD-10-CM | POA: Diagnosis present

## 2021-01-27 LAB — COMPREHENSIVE METABOLIC PANEL
ALT: 15 U/L (ref 0–44)
AST: 18 U/L (ref 15–41)
Albumin: 3 g/dL — ABNORMAL LOW (ref 3.5–5.0)
Alkaline Phosphatase: 139 U/L — ABNORMAL HIGH (ref 38–126)
Anion gap: 10 (ref 5–15)
BUN: 12 mg/dL (ref 8–23)
CO2: 25 mmol/L (ref 22–32)
Calcium: 9 mg/dL (ref 8.9–10.3)
Chloride: 104 mmol/L (ref 98–111)
Creatinine, Ser: 0.81 mg/dL (ref 0.44–1.00)
GFR, Estimated: >60 mL/min (ref >60)
Glucose, Bld: 104 mg/dL — ABNORMAL HIGH (ref 70–99)
Potassium: 4 mmol/L (ref 3.5–5.1)
Sodium: 139 mmol/L (ref 135–145)
Total Bilirubin: 0.5 mg/dL (ref 0.3–1.2)
Total Protein: 5.8 g/dL — ABNORMAL LOW (ref 6.5–8.1)

## 2021-01-27 LAB — CBC
HCT: 37.2 % (ref 36.0–46.0)
Hemoglobin: 11.6 g/dL — ABNORMAL LOW (ref 12.0–15.0)
MCH: 26.7 pg (ref 26.0–34.0)
MCHC: 31.2 g/dL (ref 30.0–36.0)
MCV: 85.5 fL (ref 80.0–100.0)
Platelets: 254 10*3/uL (ref 150–400)
RBC: 4.35 MIL/uL (ref 3.87–5.11)
RDW: 14.3 % (ref 11.5–15.5)
WBC: 6.4 10*3/uL (ref 4.0–10.5)
nRBC: 0 % (ref 0.0–0.2)

## 2021-01-27 LAB — HEPARIN LEVEL (UNFRACTIONATED): Heparin Unfractionated: 0.37 IU/mL (ref 0.30–0.70)

## 2021-01-27 LAB — MAGNESIUM: Magnesium: 2.1 mg/dL (ref 1.7–2.4)

## 2021-01-27 LAB — HIV ANTIBODY (ROUTINE TESTING W REFLEX): HIV Screen 4th Generation wRfx: NONREACTIVE

## 2021-01-27 LAB — APTT
aPTT: 60 seconds — ABNORMAL HIGH (ref 24–36)
aPTT: 67 seconds — ABNORMAL HIGH (ref 24–36)
aPTT: 98 seconds — ABNORMAL HIGH (ref 24–36)

## 2021-01-27 SURGERY — PERIPHERAL VASCULAR CATHETERIZATION

## 2021-01-27 MED ORDER — PANTOPRAZOLE SODIUM 40 MG PO TBEC
40.0000 mg | DELAYED_RELEASE_TABLET | Freq: Two times a day (BID) | ORAL | Status: DC
  Administered 2021-01-27 – 2021-01-29 (×4): 40 mg via ORAL
  Filled 2021-01-27 (×4): qty 1

## 2021-01-27 NOTE — Progress Notes (Signed)
Venous ultrasound does not show any evidence of ileofemoral DVT.  Therefore, I would not recommend mechanical thrombectomy.  She should be treated with full dose anticoagulation.  She can resume her diet.  I have ordered thigh-high compression stockings.  She can ambulate.  Nothing further from vascular perspective during this admission.  I will sign off.  The patient knows that if she continues to have significant leg issues to contact me next week and we would consider mechanical thrombectomy, otherwise this should be managed with anticoagulation and compression.  Annamarie Major

## 2021-01-27 NOTE — ED Notes (Signed)
Pt appears to be asleep. NAD noted. 

## 2021-01-27 NOTE — Progress Notes (Signed)
ANTICOAGULATION CONSULT NOTE - Follow Up  Pharmacy Consult for heparin Indication:  Acute DVT  Allergies  Allergen Reactions   Adhesive [Tape] Itching and Rash    Paper tape is better but best to use no adhesive if possible    Patient Measurements: Height: '5\' 1"'$  (154.9 cm) Weight: 94.8 kg (209 lb) IBW/kg (Calculated) : 47.8 Heparin Dosing Weight: 81 kg  Vital Signs: Temp: 98.2 F (36.8 C) (08/18 0800) Temp Source: Oral (08/18 0800) BP: 122/72 (08/18 1100) Pulse Rate: 89 (08/18 1100)  Labs: Recent Labs    01/26/21 1111 01/27/21 0345 01/27/21 0346 01/27/21 1143  HGB 13.1 11.6*  --   --   HCT 41.6 37.2  --   --   PLT 295 254  --   --   APTT  --  60*  --  67*  HEPARINUNFRC  --  0.37  --   --   CREATININE 0.81  --  0.81  --      Estimated Creatinine Clearance: 70.9 mL/min (by C-G formula based on SCr of 0.81 mg/dL).   Medical History: Past Medical History:  Diagnosis Date   Anxiety    Asthma    Atrial fibrillation ([place]) 11/2010   paroxysmal   Bradycardia    Depression    DJD (degenerative joint disease)    Cervical spine   Fibromyalgia    s/p rheumatology consultation/Zeminsky   Mild sleep apnea    Obesity    Osteoarthritis    Shoulders, hips,knees   Assessment: Angela Cummings on Xarelto PTA for AFib now with acute LLE DVT. Last dose of Xarelto 8/16 1800. Pharmacy consulted to transition to IV heparin.   Heparin level 0.37 (affected by Xarelto), aPTT 60 sec (subtherapeutic) on gtt at 1550 units/hr. No issues with line or bleeding reported per RN.  8/18 afternoon follow up: aPTT 67 sec (subtherapeutic)  Goal of Therapy:  Heparin level 0.3-0.7 units/ml aPTT 66-102 seconds Monitor platelets by anticoagulation protocol: Yes   Plan:  Increase heparin to 1750 units/hr given on low end of therapeutic goal and don't want to follow below goal. F/u 6 hr PTT  Joetta Manners, PharmD, [facility] Emergency Medicine Clinical Pharmacist ED RPh Phone: [place]:  [phone]

## 2021-01-27 NOTE — ED Notes (Signed)
PT moved up in the bed, given a sandwich and a drink as requested. Vitals updated

## 2021-01-27 NOTE — ED Notes (Signed)
Tried to call report to floor. RN not ready

## 2021-01-27 NOTE — Progress Notes (Signed)
VASCULAR LAB    Left IVC/iliac ultrasound has been performed.  See CV proc for preliminary results.   Chayah Mckee, RVT 01/27/2021, 9:03 AM

## 2021-01-27 NOTE — ED Notes (Signed)
Patient transported to vascular. 

## 2021-01-27 NOTE — Progress Notes (Signed)
ANTICOAGULATION CONSULT NOTE   Pharmacy Consult for heparin Indication:  Acute DVT  Allergies  Allergen Reactions   Adhesive [Tape] Itching and Rash    Paper tape is better but best to use no adhesive if possible    Patient Measurements: Height: '5\' 1"'$  (154.9 cm) Weight: 94.8 kg (209 lb) IBW/kg (Calculated) : 47.8 Heparin Dosing Weight: 81 kg  Vital Signs: Temp: 98.1 F (36.7 C) (08/18 0334) Temp Source: Oral (08/18 0334) BP: 105/61 (08/18 0200) Pulse Rate: 80 (08/18 0200)  Labs: Recent Labs    01/26/21 1111 01/27/21 0345  HGB 13.1 11.6*  HCT 41.6 37.2  PLT 295 254  APTT  --  60*  HEPARINUNFRC  --  0.37  CREATININE 0.81  --      Estimated Creatinine Clearance: 70.9 mL/min (by C-G formula based on SCr of 0.81 mg/dL).   Medical History: Past Medical History:  Diagnosis Date   Anxiety    Asthma    Atrial fibrillation ([place]) 11/2010   paroxysmal   Bradycardia    Depression    DJD (degenerative joint disease)    Cervical spine   Fibromyalgia    s/p rheumatology consultation/Zeminsky   Mild sleep apnea    Obesity    Osteoarthritis    Shoulders, hips,knees    Assessment: 3 YOF on Xarelto PTA for AFib now with acute LLE DVT. Last dose of Xarelto 8/16 1800. Pharmacy consulted to transition to IV heparin.   Heparin level 0.37 (affected by Xarelto), aPTT 60 sec (subtherapeutic) on gtt at 1550 units/hr. No issues with line or bleeding reported per RN.  Goal of Therapy:  Heparin level 0.3-0.7 units/ml aPTT 66-102 seconds Monitor platelets by anticoagulation protocol: Yes   Plan:  Increase heparin to 1700 units/hr  F/u 6 hr PTT  Sherlon Handing, PharmD, BCPS Please see amion for complete clinical pharmacist phone list 01/27/2021 4:49 AM

## 2021-01-27 NOTE — ED Notes (Signed)
Report called to floor 4E nurse Marguarite Arbour took report

## 2021-01-27 NOTE — Progress Notes (Signed)
    Subjective  -   Says her left leg feels a little better   Physical Exam:  Left leg is warm and well-perfused Left leg is larger than the right.   Nonlabored breathing       Assessment/Plan:    Left leg DVT: The patient will get a ileal caval venous ultrasound this morning to make sure that there is no clot extending into the common femoral vein or higher.  If there is no clot in the common femoral vein or higher, she can be discharged home from my perspective on anticoagulation and 20-30 thigh-high compressions.  I have instructed her to contact me next week if she is having recurrent disabilities and we could consider mechanical thrombectomy.  However with a clot below the common femoral vein, I would prefer to treat this medically.  If the clot extends above or into the common femoral vein, I would consider mechanical thrombectomy.  Wells Perry Brucato 01/27/2021 7:44 AM --  Vitals:   01/27/21 0400 01/27/21 0600  BP: 122/78 112/79  Pulse: 91 83  Resp: 13 17  Temp:    SpO2: 96% 97%   No intake or output data in the 24 hours ending 01/27/21 0744   Laboratory CBC    Component Value Date/Time   WBC 6.4 01/27/2021 0345   HGB 11.6 (L) 01/27/2021 0345   HCT 37.2 01/27/2021 0345   PLT 254 01/27/2021 0345    BMET    Component Value Date/Time   NA 139 01/27/2021 0346   K 4.0 01/27/2021 0346   CL 104 01/27/2021 0346   CO2 25 01/27/2021 0346   GLUCOSE 104 (H) 01/27/2021 0346   BUN 12 01/27/2021 0346   CREATININE 0.81 01/27/2021 0346   CREATININE 0.82 11/24/2020 1415   CALCIUM 9.0 01/27/2021 0346   GFRNONAA >60 01/27/2021 0346   GFRAA >60 07/21/2019 1001    COAG Lab Results  Component Value Date   INR 1.05 07/25/2012   INR 1.15 03/10/2012   No results found for: PTT  Antibiotics Anti-infectives (From admission, onward)    None        V. Leia Alf, M.D., [facility] [place] Office: [phone] Pager:   [phone]

## 2021-01-27 NOTE — Progress Notes (Addendum)
ANTICOAGULATION CONSULT NOTE - Follow Up  Pharmacy Consult for heparin Indication:  Acute DVT  Allergies  Allergen Reactions   Adhesive [Tape] Itching and Rash    Paper tape is better but best to use no adhesive if possible    Patient Measurements: Height: '5\' 1"'$  (154.9 cm) Weight: 94.8 kg (209 lb) IBW/kg (Calculated) : 47.8 Heparin Dosing Weight: 81 kg  Vital Signs: Temp: 98.3 F (36.8 C) (08/18 2011) Temp Source: Oral (08/18 2011) BP: 94/82 (08/18 2011) Pulse Rate: 77 (08/18 2011)  Labs: Recent Labs    01/26/21 1111 01/27/21 0345 01/27/21 0346 01/27/21 1143 01/27/21 2000  HGB 13.1 11.6*  --   --   --   HCT 41.6 37.2  --   --   --   PLT 295 254  --   --   --   APTT  --  60*  --  67* 98*  HEPARINUNFRC  --  0.37  --   --   --   CREATININE 0.81  --  0.81  --   --      Estimated Creatinine Clearance: 70.9 mL/min (by C-G formula based on SCr of 0.81 mg/dL).   Medical History: Past Medical History:  Diagnosis Date   Anxiety    Asthma    Atrial fibrillation ([place]) 11/2010   paroxysmal   Bradycardia    Depression    DJD (degenerative joint disease)    Cervical spine   Fibromyalgia    s/p rheumatology consultation/Zeminsky   Mild sleep apnea    Obesity    Osteoarthritis    Shoulders, hips,knees   Assessment: 36 YOF on Xarelto PTA for AFib now with acute LLE DVT. Last dose of Xarelto 8/16 1800. Pharmacy consulted to transition to IV heparin.   8/18 evening aPTT 98 sec (therapeutic)  Goal of Therapy:  Heparin level 0.3-0.7 units/ml aPTT 66-102 seconds Monitor platelets by anticoagulation protocol: Yes   Plan:  Continue heparin IV 1700 units/hr. Follow up daily aPTT, heparin level, and CBC Monitor signs and symptoms of bleeding.   Varney Daily, PharmD PGY1 Pharmacy Resident  Please check AMION for all [facility] pharmacy phone numbers After 10:00 PM call main pharmacy [phone]

## 2021-01-27 NOTE — ED Notes (Signed)
Provider at bedside

## 2021-01-27 NOTE — Progress Notes (Addendum)
PROGRESS NOTE    Angela Cummings  [id number] DOB: Aug 03, 1955 DOA: 01/26/2021 PCP: Horald Pollen, MD   Brief Narrative: Angela Cummings is a 65 y.o. female with a history of paroxysmal atrial fibrillation on Xarelto, asthma, GERD. Patient presented secondary to left leg pain and was found to have a left lower extremity DVT. Started on Heparin and vascular surgery consulted.   Assessment & Plan:   Principal Problem:   Acute deep vein thrombosis (DVT) (HCC) Active Problems:   PAF (paroxysmal atrial fibrillation) (HCC)   Asthma   GERD (gastroesophageal reflux disease)   GAD (generalized anxiety disorder)   Acute DVT of LLE In setting of Xarelto use and prolonged immobilization. Patient was on Xarelto 20 mg daily prior to admission. Vascular surgery consulted secondary to concern for extensive clot. Vascular surgery ordered venous ultrasound of IVC/Iliac veins which was negative and have signed off. Per discussion with vascular, no preference between Eliquis and Coumadin; they have also recommended thigh high stockings. No clinical symptoms to suggest PE. -Continue heparin IV today and switch to Eliquis in AM -PT/OT eval  Paroxysmal atrial fibrillation Currently in atrial fibrillation. On diltiazem and Toprol XL and Xarelto as an outpatient. -Continue diltiazem and Toprol XL -Anticoagulation as mentioned above  Asthma On albuterol prn  Anxiety -Continue lorazepam  GERD Patient is on Protonix   DVT prophylaxis: Heparin IV Code Status:   Code Status: Full Code Family Communication: None at bedside Disposition Plan: Discharge home likely in 24 hours once transitioned to oral anticoagulation   Consultants:  Vascular surgery  Procedures:  LEFT LOWER EXTREMITY VENOUS VASCULAR ULTRASOUND (01/26/2021) Summary:  RIGHT:  - No evidence of common femoral vein obstruction.   LEFT:  - Findings consistent with acute deep vein thrombosis involving the left   popliteal vein, left posterior tibial veins, and left peroneal veins.  - Findings consistent with age indeterminate deep vein thrombosis  involving the left femoral vein.  IVC/ILIAC VENOUS ULTRASOUND Summary:  IVC/Iliac: No thrombus noted in the left external iliac and mid to distal  common iliac veins.  Antimicrobials: None    Subjective: Swelling is improving. Pain is improving.  Objective: Vitals:   01/27/21 0600 01/27/21 0800 01/27/21 1100 01/27/21 1300  BP: 112/79 119/75 122/72 125/80  Pulse: 83 90 89 68  Resp: '17 16 16 20  '$ Temp:  98.2 F (36.8 C)    TempSrc:  Oral    SpO2: 97% 97% 97% 95%  Weight:      Height:       No intake or output data in the 24 hours ending 01/27/21 1505 Filed Weights   01/26/21 1106  Weight: 94.8 kg    Examination:  General exam: Appears calm and comfortable  Respiratory system: Clear to auscultation. Respiratory effort normal. Cardiovascular system: S1 & S2 heard, irregular rhythm. No murmurs, rubs, gallops or clicks. Gastrointestinal system: Abdomen is nondistended, soft and nontender. No organomegaly or masses felt. Normal bowel sounds heard. Central nervous system: Alert and oriented. No focal neurological deficits. Musculoskeletal: Left lower leg edema and; slightly red. Skin: No cyanosis. No rashes Psychiatry: Judgement and insight appear normal. Mood & affect appropriate.     Data Reviewed: I have personally reviewed following labs and imaging studies  CBC Lab Results  Component Value Date   WBC 6.4 01/27/2021   RBC 4.35 01/27/2021   HGB 11.6 (L) 01/27/2021   HCT 37.2 01/27/2021   MCV 85.5 01/27/2021   MCH 26.7 01/27/2021  PLT 254 01/27/2021   MCHC 31.2 01/27/2021   RDW 14.3 01/27/2021   LYMPHSABS 1.6 01/26/2021   MONOABS 0.7 01/26/2021   EOSABS 0.2 01/26/2021   BASOSABS 0.1 0000000     Last metabolic panel Lab Results  Component Value Date   NA 139 01/27/2021   K 4.0 01/27/2021   CL 104 01/27/2021    CO2 25 01/27/2021   BUN 12 01/27/2021   CREATININE 0.81 01/27/2021   GLUCOSE 104 (H) 01/27/2021   GFRNONAA >60 01/27/2021   GFRAA >60 07/21/2019   CALCIUM 9.0 01/27/2021   PROT 5.8 (L) 01/27/2021   ALBUMIN 3.0 (L) 01/27/2021   BILITOT 0.5 01/27/2021   ALKPHOS 139 (H) 01/27/2021   AST 18 01/27/2021   ALT 15 01/27/2021   ANIONGAP 10 01/27/2021    CBG (last 3)  No results for input(s): GLUCAP in the last 72 hours.   GFR: Estimated Creatinine Clearance: 70.9 mL/min (by C-G formula based on SCr of 0.81 mg/dL).  Coagulation Profile: No results for input(s): INR, PROTIME in the last 168 hours.  Recent Results (from the past 240 hour(s))  Resp Panel by RT-PCR (Flu A&B, Covid) Nasopharyngeal Swab     Status: None   Collection Time: 01/26/21 11:17 AM   Specimen: Nasopharyngeal Swab; Nasopharyngeal(NP) swabs in vial transport medium  Result Value Ref Range Status   SARS Coronavirus 2 by RT PCR NEGATIVE NEGATIVE Final    Comment: (NOTE) SARS-CoV-2 target nucleic acids are NOT DETECTED.  The SARS-CoV-2 RNA is generally detectable in upper respiratory specimens during the acute phase of infection. The lowest concentration of SARS-CoV-2 viral copies this assay can detect is 138 copies/mL. A negative result does not preclude SARS-Cov-2 infection and should not be used as the sole basis for treatment or other patient management decisions. A negative result may occur with  improper specimen collection/handling, submission of specimen other than nasopharyngeal swab, presence of viral mutation(s) within the areas targeted by this assay, and inadequate number of viral copies(<138 copies/mL). A negative result must be combined with clinical observations, patient history, and epidemiological information. The expected result is Negative.  Fact Sheet for Patients:  EntrepreneurPulse.com.au  Fact Sheet for Healthcare Providers:   IncredibleEmployment.be  This test is no t yet approved or cleared by the [place] FDA and  has been authorized for detection and/or diagnosis of SARS-CoV-2 by FDA under an Emergency Use Authorization (EUA). This EUA will remain  in effect (meaning this test can be used) for the duration of the COVID-19 declaration under Section 564(b)(1) of the Act, 21 U.S.C.section 360bbb-3(b)(1), unless the authorization is terminated  or revoked sooner.       Influenza A by PCR NEGATIVE NEGATIVE Final   Influenza B by PCR NEGATIVE NEGATIVE Final    Comment: (NOTE) The Xpert Xpress SARS-CoV-2/FLU/RSV plus assay is intended as an aid in the diagnosis of influenza from Nasopharyngeal swab specimens and should not be used as a sole basis for treatment. Nasal washings and aspirates are unacceptable for Xpert Xpress SARS-CoV-2/FLU/RSV testing.  Fact Sheet for Patients: EntrepreneurPulse.com.au  Fact Sheet for Healthcare Providers: IncredibleEmployment.be  This test is not yet approved or cleared by the [place] FDA and has been authorized for detection and/or diagnosis of SARS-CoV-2 by FDA under an Emergency Use Authorization (EUA). This EUA will remain in effect (meaning this test can be used) for the duration of the COVID-19 declaration under Section 564(b)(1) of the Act, 21 U.S.C. section 360bbb-3(b)(1), unless the authorization is terminated  or revoked.  Performed at [facility], [place] [address] [place], [place] [address]         Radiology Studies: VAS [place] IVC/ILIAC (VENOUS ONLY)  Result Date: 01/27/2021 IVC/ILIAC STUDY Patient Name:  Serita Kyle  Date of Exam:   01/27/2021 Medical Rec #: [id number]          Accession #:    [id number] Date of Birth: [birth year]           Patient Gender: F Patient Age:   21 years Exam Location:  [facility] Procedure:      VAS [place] IVC/ILIAC (VENOUS ONLY) Referring Phys: Harold Barban --------------------------------------------------------------------------------  Indications: DVT found left distal femoral vein, popliteal, posterior tibial,              and peroneal veins, 01/26/21 Other Factors: Patient currently anticoagulated on Xarelto for atrial                fibrillation.  Comparison Study: Prior lower extremity venous duplex done 01/26/21 Performing Technologist: Sharion Dove RVS  Examination Guidelines: A complete evaluation includes B-mode imaging, spectral Doppler, color Doppler, and power Doppler as needed of all accessible portions of each vessel. Bilateral testing is considered an integral part of a complete examination. Limited examinations for reoccurring indications may be performed as noted.  +-------------------+---------+-----------+---------+-----------+--------+         CIV        RT-PatentRT-ThrombusLT-PatentLT-ThrombusComments +-------------------+---------+-----------+---------+-----------+--------+ Common Iliac Mid                        patent                      +-------------------+---------+-----------+---------+-----------+--------+ Common Iliac Distal                     patent                      +-------------------+---------+-----------+---------+-----------+--------+  +------------------------+---------+-----------+---------+-----------+--------+           EIV           RT-PatentRT-ThrombusLT-PatentLT-ThrombusComments +------------------------+---------+-----------+---------+-----------+--------+ External Iliac Vein Prox patent              patent                      +------------------------+---------+-----------+---------+-----------+--------+   Summary: IVC/Iliac: No thrombus noted in the left external iliac and mid to distal common iliac veins.  *See table(s) above for measurements and observations.  Electronically signed by Servando Snare MD on 01/27/2021 at 1:36:59 PM.    Final    VAS [place] LOWER EXTREMITY VENOUS  (DVT)  Result Date: 01/26/2021  Lower Venous DVT Study Patient Name:  TRILLIAN LANPHEAR  Date of Exam:   01/26/2021 Medical Rec #: [id number]          Accession #:    [id number] Date of Birth: June 14, 1955           Patient Gender: F Patient Age:   38 years Exam Location:  [facility] Procedure:      VAS [place] LOWER EXTREMITY VENOUS (DVT) Referring Phys: Butch Penny CARROLL --------------------------------------------------------------------------------  Indications: Pain, and Swelling.  Anticoagulation: Xarelto. Comparison Study: No prior Performing Technologist: Oda Cogan RDMS, RVT  Examination Guidelines: A complete evaluation includes B-mode imaging, spectral Doppler, color Doppler, and power Doppler as needed of all accessible portions of each vessel. Bilateral testing is considered an integral part  of a complete examination. Limited examinations for reoccurring indications may be performed as noted. The reflux portion of the exam is performed with the patient in reverse Trendelenburg.  +-----+---------------+---------+-----------+----------+--------------+ RIGHTCompressibilityPhasicitySpontaneityPropertiesThrombus Aging +-----+---------------+---------+-----------+----------+--------------+ CFV  Full           Yes      Yes                                 +-----+---------------+---------+-----------+----------+--------------+ SFJ  Full                                                        +-----+---------------+---------+-----------+----------+--------------+   +---------+---------------+---------+-----------+----------+-----------------+ LEFT     CompressibilityPhasicitySpontaneityPropertiesThrombus Aging    +---------+---------------+---------+-----------+----------+-----------------+ CFV      Full           Yes      Yes                                    +---------+---------------+---------+-----------+----------+-----------------+ SFJ      Full                                                            +---------+---------------+---------+-----------+----------+-----------------+ FV Prox  Full                                                           +---------+---------------+---------+-----------+----------+-----------------+ FV Mid   Full                                                           +---------+---------------+---------+-----------+----------+-----------------+ FV DistalPartial                                      Age Indeterminate +---------+---------------+---------+-----------+----------+-----------------+ PFV      Full                                                           +---------+---------------+---------+-----------+----------+-----------------+ POP      None                               dilated   Acute             +---------+---------------+---------+-----------+----------+-----------------+ PTV      None  dilated   Acute             +---------+---------------+---------+-----------+----------+-----------------+ PERO     None                               dilated   Acute             +---------+---------------+---------+-----------+----------+-----------------+    Summary: RIGHT: - No evidence of common femoral vein obstruction.  LEFT: - Findings consistent with acute deep vein thrombosis involving the left popliteal vein, left posterior tibial veins, and left peroneal veins. - Findings consistent with age indeterminate deep vein thrombosis involving the left femoral vein.  *See table(s) above for measurements and observations. Electronically signed by Harold Barban MD on 01/26/2021 at 10:43:15 PM.    Final         Scheduled Meds:  diltiazem  240 mg Oral Daily   meloxicam  15 mg Oral Daily   metoprolol succinate  12.5 mg Oral Daily   pantoprazole (PROTONIX) IV  40 mg Intravenous Q12H   Continuous Infusions:  heparin 1,700 Units/hr (01/27/21 1405)     LOS: 1  day     Cordelia Poche, MD [facility] 01/27/2021, 3:05 PM  If 7PM-7AM, please contact night-coverage [email or web address]

## 2021-01-28 LAB — CBC
HCT: 37.2 % (ref 36.0–46.0)
Hemoglobin: 11.6 g/dL — ABNORMAL LOW (ref 12.0–15.0)
MCH: 26.6 pg (ref 26.0–34.0)
MCHC: 31.2 g/dL (ref 30.0–36.0)
MCV: 85.3 fL (ref 80.0–100.0)
Platelets: 272 10*3/uL (ref 150–400)
RBC: 4.36 MIL/uL (ref 3.87–5.11)
RDW: 14.4 % (ref 11.5–15.5)
WBC: 6 10*3/uL (ref 4.0–10.5)
nRBC: 0 % (ref 0.0–0.2)

## 2021-01-28 LAB — PROTIME-INR
INR: 1 (ref 0.8–1.2)
Prothrombin Time: 13.6 seconds (ref 11.4–15.2)

## 2021-01-28 LAB — HEPARIN LEVEL (UNFRACTIONATED): Heparin Unfractionated: 0.86 IU/mL — ABNORMAL HIGH (ref 0.30–0.70)

## 2021-01-28 LAB — APTT: aPTT: 118 seconds — ABNORMAL HIGH (ref 24–36)

## 2021-01-28 MED ORDER — COUMADIN BOOK
Freq: Once | Status: AC
  Filled 2021-01-28: qty 1

## 2021-01-28 MED ORDER — WARFARIN - PHARMACIST DOSING INPATIENT: Freq: Every day | Status: DC

## 2021-01-28 MED ORDER — ENOXAPARIN SODIUM 100 MG/ML IJ SOSY
100.0000 mg | PREFILLED_SYRINGE | Freq: Two times a day (BID) | INTRAMUSCULAR | Status: DC
  Administered 2021-01-28 – 2021-01-29 (×3): 100 mg via SUBCUTANEOUS
  Filled 2021-01-28 (×4): qty 1

## 2021-01-28 MED ORDER — WARFARIN SODIUM 7.5 MG PO TABS
7.5000 mg | ORAL_TABLET | Freq: Once | ORAL | Status: AC
  Administered 2021-01-28: 7.5 mg via ORAL
  Filled 2021-01-28: qty 1

## 2021-01-28 NOTE — Plan of Care (Signed)
  Problem: Health Behavior/Discharge Planning: Goal: Ability to manage health-related needs will improve Outcome: Progressing   Problem: Clinical Measurements: Goal: Will remain free from infection Outcome: Progressing Goal: Diagnostic test results will improve Outcome: Progressing   

## 2021-01-28 NOTE — Progress Notes (Signed)
Nurse to give Patient advance directives and will notify Chaplain when ready.   Jaclynn Major, [place], [facility], Pager [phone]

## 2021-01-28 NOTE — Progress Notes (Signed)
ANTICOAGULATION CONSULT NOTE - Initial Consult  Pharmacy Consult for warfarin and enoxaparin  Indication: acute DVT  Allergies  Allergen Reactions   Adhesive [Tape] Itching and Rash    Paper tape is better but best to use no adhesive if possible    Patient Measurements: Height: '5\' 1"'$  (154.9 cm) Weight: 94.8 kg (209 lb 0.3 oz) IBW/kg (Calculated) : 47.8 Heparin Dosing Weight: 81 kg  Vital Signs: Temp: 98.1 F (36.7 C) (08/19 1154) Temp Source: Oral (08/19 1154) BP: 112/77 (08/19 1154) Pulse Rate: 84 (08/19 0818)  Labs: Recent Labs    01/26/21 1111 01/26/21 1111 01/27/21 0345 01/27/21 0346 01/27/21 1143 01/27/21 2000 01/28/21 0217 01/28/21 0948  HGB 13.1  --  11.6*  --   --   --  11.6*  --   HCT 41.6  --  37.2  --   --   --  37.2  --   PLT 295  --  254  --   --   --  272  --   APTT  --    < > 60*  --  67* 98* 118*  --   LABPROT  --   --   --   --   --   --   --  13.6  INR  --   --   --   --   --   --   --  1.0  HEPARINUNFRC  --   --  0.37  --   --   --  0.86*  --   CREATININE 0.81  --   --  0.81  --   --   --   --    < > = values in this interval not displayed.    Estimated Creatinine Clearance: 70.9 mL/min (by C-G formula based on SCr of 0.81 mg/dL).   Medical History: Past Medical History:  Diagnosis Date   Anxiety    Asthma    Atrial fibrillation ([place]) 11/2010   paroxysmal   Bradycardia    Depression    DJD (degenerative joint disease)    Cervical spine   Fibromyalgia    s/p rheumatology consultation/Zeminsky   Mild sleep apnea    Obesity    Osteoarthritis    Shoulders, hips,knees    Medications:  Scheduled:   coumadin book   Does not apply Once   diltiazem  240 mg Oral Daily   enoxaparin (LOVENOX) injection  100 mg Subcutaneous BID   meloxicam  15 mg Oral Daily   metoprolol succinate  12.5 mg Oral Daily   pantoprazole  40 mg Oral BID   warfarin  7.5 mg Oral ONCE-1600   Warfarin - Pharmacist Dosing Inpatient   Does not apply q1600     Assessment: 46 YOF on Xarelto PTA for AFib now with acute LLE DVT. Last dose of Xarelto 8/16 1800. Patient has remained on IV heparin since 8/17. Pharmacy consulted to transition from IV heparin to warfarin with enoxaparin bridge.     Goal of Therapy:  INR 2-3 Monitor platelets by anticoagulation protocol: Yes   Plan:  - stop heparin gtt - start Lovenox '100mg'$  twice daily while INR remains subtherapeutic - start Warfarin 7.'5mg'$  daily - F/u daily AM INR   Thank you for allowing pharmacy to be a part of this patient's care.  Ardyth Harps, PharmD Clinical Pharmacist

## 2021-01-28 NOTE — Plan of Care (Signed)
  Problem: Health Behavior/Discharge Planning: Goal: Ability to manage health-related needs will improve 01/28/2021 0058 by Peggye Pitt, RN Outcome: Progressing 01/28/2021 0057 by Peggye Pitt, RN Outcome: Progressing   Problem: Clinical Measurements: Goal: Will remain free from infection 01/28/2021 0058 by Peggye Pitt, RN Outcome: Progressing 01/28/2021 0057 by Peggye Pitt, RN Outcome: Progressing Goal: Diagnostic test results will improve Outcome: Progressing

## 2021-01-28 NOTE — Evaluation (Signed)
Occupational Therapy Evaluation/Discharge Patient Details Name: Angela Cummings MRN: [id number] DOB: Dec 29, 1955 Today's Date: 01/28/2021    History of Present Illness Angela Cummings is a 65 y.o. female admitted on 8/17 due to acute DVT of L LE and pain. Pt transitioned from Xarelto to heparin and admitted for further DVT mgmt. PMH: a fib, asthma, GERD.   Clinical Impression   PTA, pt lives with daughter and 6 great-grandchildren that she cares for (5 m/o to 67 y/o). Pt reports Independence in all ADLs, IADLs and mobility. Pt presents now with L LE a little sore but much improved per pt. Pt able to mobilize in room Independently (good IV pole mgmt) and complete ADLs Independently as well. Anticipate quick return to PLOF without need for OT follow-up. Pt reports her grandson can assist while daughter is at work. OT to sign off at acute level.     Follow Up Recommendations  No OT follow up    Equipment Recommendations  None recommended by OT    Recommendations for Other Services       Precautions / Restrictions Precautions Precautions: Fall Precaution Comments: relatively low fall risk Restrictions Weight Bearing Restrictions: No      Mobility Bed Mobility Overal bed mobility: Modified Independent             General bed mobility comments: HOB elevated    Transfers Overall transfer level: Independent Equipment used: None                  Balance Overall balance assessment: No apparent balance deficits (not formally assessed)                                         ADL either performed or assessed with clinical judgement   ADL Overall ADL's : Modified independent;Independent                                       General ADL Comments: Pt able to demo bed mobility, mobility to/from bathroom with good navigation of IV pole and toileting task without physical assistance     Vision Baseline Vision/History: Wears  glasses Wears Glasses: Reading only Patient Visual Report: No change from baseline Vision Assessment?: No apparent visual deficits     Perception     Praxis      Pertinent Vitals/Pain Pain Assessment: Faces Faces Pain Scale: Hurts a little bit Pain Location: L LE Pain Descriptors / Indicators: Sore Pain Intervention(s): Monitored during session     Hand Dominance Right   Extremity/Trunk Assessment Upper Extremity Assessment Upper Extremity Assessment: Overall WFL for tasks assessed   Lower Extremity Assessment Lower Extremity Assessment: Defer to PT evaluation   Cervical / Trunk Assessment Cervical / Trunk Assessment: Normal   Communication Communication Communication: No difficulties   Cognition Arousal/Alertness: Awake/alert Behavior During Therapy: WFL for tasks assessed/performed Overall Cognitive Status: Within Functional Limits for tasks assessed                                     General Comments  VSS on RA    Exercises     Shoulder Instructions      Home Living Family/patient expects to be discharged to:: Private residence Living  Arrangements: Children;Other relatives (6 great grandchildren 67 mo to 75 y.o) Available Help at Discharge: Family;Available PRN/intermittently Type of Home: House Home Access: Stairs to enter [employer] of Steps: 4-5 Entrance Stairs-Rails: Right;Left Home Layout: One level     Bathroom Shower/Tub: Occupational psychologist: Standard         Additional Comments: Reports daughter works but her grandson can assist at DC as needed      Prior Functioning/Environment Level of Independence: Independent        Comments: no use of AD, assists in caring for great grandchildren, active. Cooks, cleans, etc.        OT Problem List:        OT Treatment/Interventions:      OT Goals(Current goals can be found in the care plan section) Acute Rehab OT Goals Patient Stated Goal: get  back to great grandkids OT Goal Formulation: All assessment and education complete, DC therapy  OT Frequency:     Barriers to D/C:            Co-evaluation              AM-PAC OT "6 Clicks" Daily Activity     Outcome Measure Help from another person eating meals?: None Help from another person taking care of personal grooming?: None Help from another person toileting, which includes using toliet, bedpan, or urinal?: None Help from another person bathing (including washing, rinsing, drying)?: None Help from another person to put on and taking off regular upper body clothing?: None Help from another person to put on and taking off regular lower body clothing?: None 6 Click Score: 24   End of Session Nurse Communication: Mobility status;Other (comment) (safe with mobility to/from bathroom)  Activity Tolerance: Patient tolerated treatment well Patient left: in chair;with call bell/phone within reach  OT Visit Diagnosis: Pain Pain - Right/Left: Left Pain - part of body: Leg                Time: [id number] OT Time Calculation (min): 12 min Charges:  OT General Charges $OT Visit: 1 Visit OT Evaluation $OT Eval Low Complexity: 1 Low  Malachy Chamber, OTR/L Acute Rehab Services Office: [phone]   Layla Maw 01/28/2021, 8:28 AM

## 2021-01-28 NOTE — Progress Notes (Signed)
ANTICOAGULATION CONSULT NOTE - Follow Up  Pharmacy Consult for heparin Indication:  Acute DVT Labs: Recent Labs    01/26/21 1111 01/26/21 1111 01/27/21 0345 01/27/21 0346 01/27/21 1143 01/27/21 2000 01/28/21 0217  HGB 13.1  --  11.6*  --   --   --  11.6*  HCT 41.6  --  37.2  --   --   --  37.2  PLT 295  --  254  --   --   --  272  APTT  --    < > 60*  --  67* 98* 118*  HEPARINUNFRC  --   --  0.37  --   --   --  0.86*  CREATININE 0.81  --   --  0.81  --   --   --    < > = values in this interval not displayed.   Assessment: 52 YOF on Xarelto PTA for AFib now with acute LLE DVT. Last dose of Xarelto 8/16 1800. Pharmacy consulted to transition to IV heparin.   8/19 am HL 0.86, aPTT 118 sec  Goal of Therapy:  Heparin level 0.3-0.7 units/ml aPTT 66-102 seconds Monitor platelets by anticoagulation protocol: Yes   Plan:  Decrease heparin to 1500 units/hr Follow up 6hr heparin level  Thanks for allowing pharmacy to be a part of this patient's care. Excell Seltzer, PharmD Clinical Pharmacist

## 2021-01-28 NOTE — Discharge Instructions (Addendum)
Angela Cummings,  You were in the hospital with a left sided leg blood clot. You were given heparin which is an IV blood thinner with improvement in symptoms. You are being transitioned t Coumadin and will need a Lovenox bridge while the Coumadin reaches treatment levels. Please follow-up with your cardiologist's Coumadin clinic; I spoke with one of the cardiologists and the office should call you on Monday as you will need an INR check on either Monday or Tuesday. Please use the compression stockings when up and about, but take them off when lying down.  Information on my medicine - Coumadin   (Warfarin)   Why was Coumadin prescribed for you? Coumadin was prescribed for you because you have a blood clot or a medical condition that can cause an increased risk of forming blood clots. Blood clots can cause serious health problems by blocking the flow of blood to the heart, lung, or brain. Coumadin can prevent harmful blood clots from forming. As a reminder your indication for Coumadin is:  Deep Venous Thrombosis treatment  What test will check on my response to Coumadin? While on Coumadin (warfarin) you will need to have an INR test regularly to ensure that your dose is keeping you in the desired range. The INR (international normalized ratio) number is calculated from the result of the laboratory test called prothrombin time (PT).  If an INR APPOINTMENT HAS NOT ALREADY BEEN MADE FOR YOU please schedule an appointment to have this lab work done by your health care provider within 7 days. Your INR goal is usually a number between:  2 to 3 or your provider may give you a more narrow range like 2-2.5.  Ask your health care provider during an office visit what your goal INR is.  What  do you need to  know  About  COUMADIN? Take Coumadin (warfarin) exactly as prescribed by your healthcare provider about the same time each day.  DO NOT stop taking without talking to the doctor who prescribed the  medication.  Stopping without other blood clot prevention medication to take the place of Coumadin may increase your risk of developing a new clot or stroke.  Get refills before you run out.  What do you do if you miss a dose? If you miss a dose, take it as soon as you remember on the same day then continue your regularly scheduled regimen the next day.  Do not take two doses of Coumadin at the same time.  Important Safety Information A possible side effect of Coumadin (Warfarin) is an increased risk of bleeding. You should call your healthcare provider right away if you experience any of the following: Bleeding from an injury or your nose that does not stop. Unusual colored urine (red or dark brown) or unusual colored stools (red or black). Unusual bruising for unknown reasons. A serious fall or if you hit your head (even if there is no bleeding).  Some foods or medicines interact with Coumadin (warfarin) and might alter your response to warfarin. To help avoid this: Eat a balanced diet, maintaining a consistent amount of Vitamin K. Notify your provider about major diet changes you plan to make. Avoid alcohol or limit your intake to 1 drink for women and 2 drinks for men per day. (1 drink is 5 oz. wine, 12 oz. beer, or 1.5 oz. liquor.)  Make sure that ANY health care provider who prescribes medication for you knows that you are taking Coumadin (warfarin).  Also make  sure the healthcare provider who is monitoring your Coumadin knows when you have started a new medication including herbals and non-prescription products.  Coumadin (Warfarin)  Major Drug Interactions  Increased Warfarin Effect Decreased Warfarin Effect  Alcohol (large quantities) Antibiotics (esp. Septra/Bactrim, Flagyl, Cipro) Amiodarone (Cordarone) Aspirin (ASA) Cimetidine (Tagamet) Megestrol (Megace) NSAIDs (ibuprofen, naproxen, etc.) Piroxicam (Feldene) Propafenone (Rythmol SR) Propranolol (Inderal) Isoniazid  (INH) Posaconazole (Noxafil) Barbiturates (Phenobarbital) Carbamazepine (Tegretol) Chlordiazepoxide (Librium) Cholestyramine (Questran) Griseofulvin Oral Contraceptives Rifampin Sucralfate (Carafate) Vitamin K   Coumadin (Warfarin) Major Herbal Interactions  Increased Warfarin Effect Decreased Warfarin Effect  Garlic Ginseng Ginkgo biloba Coenzyme Q10 Green tea St. John's wort    Coumadin (Warfarin) FOOD Interactions  Eat a consistent number of servings per week of foods HIGH in Vitamin K (1 serving =  cup)  Collards (cooked, or boiled & drained) Kale (cooked, or boiled & drained) Mustard greens (cooked, or boiled & drained) Parsley *serving size only =  cup Spinach (cooked, or boiled & drained) Swiss chard (cooked, or boiled & drained) Turnip greens (cooked, or boiled & drained)  Eat a consistent number of servings per week of foods MEDIUM-HIGH in Vitamin K (1 serving = 1 cup)  Asparagus (cooked, or boiled & drained) Broccoli (cooked, boiled & drained, or raw & chopped) Brussel sprouts (cooked, or boiled & drained) *serving size only =  cup Lettuce, raw (green leaf, endive, romaine) Spinach, raw Turnip greens, raw & chopped   These websites have more information on Coumadin (warfarin):  FailFactory.se; VeganReport.com.au;

## 2021-01-28 NOTE — Progress Notes (Signed)
PROGRESS NOTE    Angela Cummings  [id number] DOB: 1956/01/10 DOA: 01/26/2021 PCP: Horald Pollen, MD   Brief Narrative: Angela Cummings is a 65 y.o. female with a history of paroxysmal atrial fibrillation on Xarelto, asthma, GERD. Patient presented secondary to left leg pain and was found to have a left lower extremity DVT. Started on Heparin and vascular surgery consulted.   Assessment & Plan:   Principal Problem:   Acute deep vein thrombosis (DVT) (HCC) Active Problems:   PAF (paroxysmal atrial fibrillation) (HCC)   Asthma   GERD (gastroesophageal reflux disease)   GAD (generalized anxiety disorder)   Acute DVT of LLE In setting of Xarelto use and prolonged immobilization. Patient was on Xarelto 20 mg daily prior to admission. Vascular surgery consulted secondary to concern for extensive clot. Vascular surgery ordered venous ultrasound of IVC/Iliac veins which was negative and have signed off. Per discussion with vascular, no preference between Eliquis and Coumadin; they have also recommended thigh high stockings. No clinical symptoms to suggest PE. Discussed with hematology who recommended transition to coumadin. -Coumadin with Lovenox bridge per pharmacy -PT/OT recommendations pending -Thigh high stocking to LLE  Paroxysmal atrial fibrillation Currently in atrial fibrillation. On diltiazem and Toprol XL and Xarelto as an outpatient. -Continue diltiazem and Toprol XL -Anticoagulation as mentioned above -Outpatient hematology follow-up for consideration of hypercoagulability workup  Asthma On albuterol prn  Anxiety -Continue lorazepam  GERD Patient is on Protonix   DVT prophylaxis: Heparin IV with transition Coumadin + Lovenox (therapeutic dosing) Code Status:   Code Status: Full Code Family Communication: None at bedside Disposition Plan: Discharge home tomorrow if able to tolerate/obtain Lovenox, otherwise, will need inpatient bridge x 3-5+  days   Consultants:  Vascular surgery Hematology (phone discussion)  Procedures:  LEFT LOWER EXTREMITY VENOUS VASCULAR ULTRASOUND (01/26/2021) Summary:  RIGHT:  - No evidence of common femoral vein obstruction.   LEFT:  - Findings consistent with acute deep vein thrombosis involving the left  popliteal vein, left posterior tibial veins, and left peroneal veins.  - Findings consistent with age indeterminate deep vein thrombosis  involving the left femoral vein.  IVC/ILIAC VENOUS ULTRASOUND Summary:  IVC/Iliac: No thrombus noted in the left external iliac and mid to distal  common iliac veins.  Antimicrobials: None    Subjective: Swelling and pain continue to improve. No other concerns.  Objective: Vitals:   01/27/21 2116 01/27/21 2324 01/28/21 0421 01/28/21 0818  BP: 109/71 (!) 100/38 (!) 101/52 (!) 112/57  Pulse:  69 75 84  Resp:  '14 13 16  '$ Temp: 98.1 F (36.7 C) 98.1 F (36.7 C) 98 F (36.7 C) 98 F (36.7 C)  TempSrc: Oral Oral Oral Oral  SpO2: 98% 97% 92% 96%  Weight:   94.8 kg   Height:        Intake/Output Summary (Last 24 hours) at 01/28/2021 0947 Last data filed at 01/28/2021 0051 Gross per 24 hour  Intake 442.38 ml  Output --  Net 442.38 ml   Filed Weights   01/26/21 1106 01/28/21 0421  Weight: 94.8 kg 94.8 kg    Examination:  General exam: Appears calm and comfortable Respiratory system: Clear to auscultation. Respiratory effort normal. Cardiovascular system: S1 & S2 heard, irregular rhythm, normal rate. No murmurs, rubs, gallops or clicks. Gastrointestinal system: Abdomen is nondistended, soft and nontender. No organomegaly or masses felt. Normal bowel sounds heard. Central nervous system: Alert and oriented. No focal neurological deficits. Musculoskeletal: Mild LLE edema. No  calf tenderness Skin: No cyanosis. No rashes Psychiatry: Judgement and insight appear normal. Mood & affect appropriate.     Data Reviewed: I have personally  reviewed following labs and imaging studies  CBC Lab Results  Component Value Date   WBC 6.0 01/28/2021   RBC 4.36 01/28/2021   HGB 11.6 (L) 01/28/2021   HCT 37.2 01/28/2021   MCV 85.3 01/28/2021   MCH 26.6 01/28/2021   PLT 272 01/28/2021   MCHC 31.2 01/28/2021   RDW 14.4 01/28/2021   LYMPHSABS 1.6 01/26/2021   MONOABS 0.7 01/26/2021   EOSABS 0.2 01/26/2021   BASOSABS 0.1 0000000     Last metabolic panel Lab Results  Component Value Date   NA 139 01/27/2021   K 4.0 01/27/2021   CL 104 01/27/2021   CO2 25 01/27/2021   BUN 12 01/27/2021   CREATININE 0.81 01/27/2021   GLUCOSE 104 (H) 01/27/2021   GFRNONAA >60 01/27/2021   GFRAA >60 07/21/2019   CALCIUM 9.0 01/27/2021   PROT 5.8 (L) 01/27/2021   ALBUMIN 3.0 (L) 01/27/2021   BILITOT 0.5 01/27/2021   ALKPHOS 139 (H) 01/27/2021   AST 18 01/27/2021   ALT 15 01/27/2021   ANIONGAP 10 01/27/2021    CBG (last 3)  No results for input(s): GLUCAP in the last 72 hours.   GFR: Estimated Creatinine Clearance: 70.9 mL/min (by C-G formula based on SCr of 0.81 mg/dL).  Coagulation Profile: No results for input(s): INR, PROTIME in the last 168 hours.  Recent Results (from the past 240 hour(s))  Resp Panel by RT-PCR (Flu A&B, Covid) Nasopharyngeal Swab     Status: None   Collection Time: 01/26/21 11:17 AM   Specimen: Nasopharyngeal Swab; Nasopharyngeal(NP) swabs in vial transport medium  Result Value Ref Range Status   SARS Coronavirus 2 by RT PCR NEGATIVE NEGATIVE Final    Comment: (NOTE) SARS-CoV-2 target nucleic acids are NOT DETECTED.  The SARS-CoV-2 RNA is generally detectable in upper respiratory specimens during the acute phase of infection. The lowest concentration of SARS-CoV-2 viral copies this assay can detect is 138 copies/mL. A negative result does not preclude SARS-Cov-2 infection and should not be used as the sole basis for treatment or other patient management decisions. A negative result may occur with   improper specimen collection/handling, submission of specimen other than nasopharyngeal swab, presence of viral mutation(s) within the areas targeted by this assay, and inadequate number of viral copies(<138 copies/mL). A negative result must be combined with clinical observations, patient history, and epidemiological information. The expected result is Negative.  Fact Sheet for Patients:  EntrepreneurPulse.com.au  Fact Sheet for Healthcare Providers:  IncredibleEmployment.be  This test is no t yet approved or cleared by the [place] FDA and  has been authorized for detection and/or diagnosis of SARS-CoV-2 by FDA under an Emergency Use Authorization (EUA). This EUA will remain  in effect (meaning this test can be used) for the duration of the COVID-19 declaration under Section 564(b)(1) of the Act, 21 U.S.C.section 360bbb-3(b)(1), unless the authorization is terminated  or revoked sooner.       Influenza A by PCR NEGATIVE NEGATIVE Final   Influenza B by PCR NEGATIVE NEGATIVE Final    Comment: (NOTE) The Xpert Xpress SARS-CoV-2/FLU/RSV plus assay is intended as an aid in the diagnosis of influenza from Nasopharyngeal swab specimens and should not be used as a sole basis for treatment. Nasal washings and aspirates are unacceptable for Xpert Xpress SARS-CoV-2/FLU/RSV testing.  Fact Sheet for Patients: EntrepreneurPulse.com.au  Fact Sheet for Healthcare Providers: IncredibleEmployment.be  This test is not yet approved or cleared by the [place] FDA and has been authorized for detection and/or diagnosis of SARS-CoV-2 by FDA under an Emergency Use Authorization (EUA). This EUA will remain in effect (meaning this test can be used) for the duration of the COVID-19 declaration under Section 564(b)(1) of the Act, 21 U.S.C. section 360bbb-3(b)(1), unless the authorization is terminated  or revoked.  Performed at [facility], [place] [address] [place], [place] [address]         Radiology Studies: VAS [place] IVC/ILIAC (VENOUS ONLY)  Result Date: 01/27/2021 IVC/ILIAC STUDY Patient Name:  Angela Cummings  Date of Exam:   01/27/2021 Medical Rec #: [id number]          Accession #:    [id number] Date of Birth: Dec 28, 1955           Patient Gender: F Patient Age:   60 years Exam Location:  [facility] Procedure:      VAS [place] IVC/ILIAC (VENOUS ONLY) Referring Phys: Harold Barban --------------------------------------------------------------------------------  Indications: DVT found left distal femoral vein, popliteal, posterior tibial,              and peroneal veins, 01/26/21 Other Factors: Patient currently anticoagulated on Xarelto for atrial                fibrillation.  Comparison Study: Prior lower extremity venous duplex done 01/26/21 Performing Technologist: Sharion Dove RVS  Examination Guidelines: A complete evaluation includes B-mode imaging, spectral Doppler, color Doppler, and power Doppler as needed of all accessible portions of each vessel. Bilateral testing is considered an integral part of a complete examination. Limited examinations for reoccurring indications may be performed as noted.  +-------------------+---------+-----------+---------+-----------+--------+         CIV        RT-PatentRT-ThrombusLT-PatentLT-ThrombusComments +-------------------+---------+-----------+---------+-----------+--------+ Common Iliac Mid                        patent                      +-------------------+---------+-----------+---------+-----------+--------+ Common Iliac Distal                     patent                      +-------------------+---------+-----------+---------+-----------+--------+  +------------------------+---------+-----------+---------+-----------+--------+           EIV           RT-PatentRT-ThrombusLT-PatentLT-ThrombusComments  +------------------------+---------+-----------+---------+-----------+--------+ External Iliac Vein Prox patent              patent                      +------------------------+---------+-----------+---------+-----------+--------+   Summary: IVC/Iliac: No thrombus noted in the left external iliac and mid to distal common iliac veins.  *See table(s) above for measurements and observations.  Electronically signed by Servando Snare MD on 01/27/2021 at 1:36:59 PM.    Final         Scheduled Meds:  diltiazem  240 mg Oral Daily   meloxicam  15 mg Oral Daily   metoprolol succinate  12.5 mg Oral Daily   pantoprazole  40 mg Oral BID   Continuous Infusions:  heparin 1,500 Units/hr (01/28/21 0517)     LOS: 2 days     Cordelia Poche, MD [facility] 01/28/2021, 9:47 AM  If 7PM-7AM,  please contact night-coverage [email or web address]

## 2021-01-28 NOTE — Progress Notes (Signed)
This chaplain responded to a consult for spiritual care; creating or updating Pt. Advance Directive.  The chaplain understands after completing AD:  HCPOA and LW education, the Pt. will fill out the document and notarize the AD outside the hospital. The Pt. plans to return the AD for scanning into EMR at her next [place] visit.  This chaplain is available for F/U spiritual care as needed.

## 2021-01-28 NOTE — Evaluation (Signed)
Physical Therapy Evaluation/Discharge Patient Details Name: Angela Cummings MRN: [id number] DOB: 1956/05/26 Today's Date: 01/28/2021   History of Present Illness  65 y.o. female admitted on 8/17 due to acute DVT of L LE and pain. Pt transitioned from Xarelto to heparin and admitted for further DVT mgmt. PMH: a fib, asthma, GERD.  Clinical Impression  Pt tolerated treatment well and was standing upon entry. VSS throughout. Pt mentioned swelling in LLE decreased, with remaining pain and soreness. Pt ambulated similar to PLOF and navigated a flight of stairs. Pt does not require acute or follow up PT.    Follow Up Recommendations No PT follow up    Equipment Recommendations  None recommended by PT    Recommendations for Other Services       Precautions / Restrictions Precautions Precautions: Fall Precaution Comments: relatively low fall risk Restrictions Weight Bearing Restrictions: No      Mobility  Bed Mobility               General bed mobility comments: pt standing upon arrival    Transfers                 General transfer comment: pt standing upon arrival  Ambulation/Gait Ambulation/Gait assistance: Modified independent (Device/Increase time) Gait Distance (Feet): 400 Feet Assistive device: None Gait Pattern/deviations: Wide base of support;Decreased stance time - left;Step-through pattern Gait velocity: Decreased Gait velocity interpretation: >2.62 ft/sec, indicative of community ambulatory General Gait Details: Steady pace with R lateral trunk lean  Stairs Stairs: Yes Stairs assistance: Independent Stair Management: One rail Left;Alternating pattern Number of Stairs: 10 General stair comments: Independent with use of rail  Wheelchair Mobility    Modified Rankin (Stroke Patients Only)       Balance Overall balance assessment: Mild deficits observed, not formally tested                                            Pertinent Vitals/Pain Pain Assessment: 0-10 Pain Score: 7  Pain Location: LLE Pain Descriptors / Indicators: Sore Pain Intervention(s): Monitored during session    Home Living Family/patient expects to be discharged to:: Private residence Living Arrangements: Children;Other relatives Available Help at Discharge: Family;Available PRN/intermittently Type of Home: House   Entrance Stairs-Rails: Right;Left Entrance Stairs-Number of Steps: 4-5 Home Layout: One level   Additional Comments: Reports daughter works but her grandson can assist at DC as needed; 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 Cooks, cleans, etc.     Hand Dominance   Dominant Hand: Right    Extremity/Trunk Assessment   Upper Extremity Assessment Upper Extremity Assessment: Defer to OT evaluation    Lower Extremity Assessment Lower Extremity Assessment: Overall WFL for tasks assessed    Cervical / Trunk Assessment Cervical / Trunk Assessment: Normal  Communication   Communication: No difficulties  Cognition Arousal/Alertness: Awake/alert Behavior During Therapy: WFL for tasks assessed/performed Overall Cognitive Status: Within Functional Limits for tasks assessed                                        General Comments General comments (skin integrity, edema, etc.): VSS on RA    Exercises     Assessment/Plan  PT Assessment Patent does not need any further PT services  PT Problem List         PT Treatment Interventions      PT Goals (Current goals can be found in the Care Plan section)  Acute Rehab PT Goals Patient Stated Goal: get back to great grandkids PT Goal Formulation: With patient Time For Goal Achievement: 02/11/21 Potential to Achieve Goals: Good    Frequency     Barriers to discharge        Co-evaluation               AM-PAC PT "6 Clicks" Mobility  Outcome Measure Help needed turning  from your back to your side while in a flat bed without using bedrails?: None Help needed moving from lying on your back to sitting on the side of a flat bed without using bedrails?: None Help needed moving to and from a bed to a chair (including a wheelchair)?: None Help needed standing up from a chair using your arms (e.g., wheelchair or bedside chair)?: None Help needed to walk in hospital room?: None Help needed climbing 3-5 steps with a railing? : None 6 Click Score: 24    End of Session   Activity Tolerance: Patient tolerated treatment well Patient left: in chair;with call bell/phone within reach Nurse Communication: Mobility status PT Visit Diagnosis: Other abnormalities of gait and mobility (R26.89)    Time: [id number] PT Time Calculation (min) (ACUTE ONLY): 13 min   Charges:   PT Evaluation $PT Eval Low Complexity: 1 Low          Trenyce Loera F, SPT Acute Rehab: [phone] [id number]     Domingo Dimes 01/28/2021, 5:47 PM

## 2021-01-29 LAB — CBC
HCT: 35.8 % — ABNORMAL LOW (ref 36.0–46.0)
Hemoglobin: 11.3 g/dL — ABNORMAL LOW (ref 12.0–15.0)
MCH: 26.8 pg (ref 26.0–34.0)
MCHC: 31.6 g/dL (ref 30.0–36.0)
MCV: 84.8 fL (ref 80.0–100.0)
Platelets: 244 10*3/uL (ref 150–400)
RBC: 4.22 MIL/uL (ref 3.87–5.11)
RDW: 14.6 % (ref 11.5–15.5)
WBC: 5.6 10*3/uL (ref 4.0–10.5)
nRBC: 0 % (ref 0.0–0.2)

## 2021-01-29 LAB — PROTIME-INR
INR: 1.1 (ref 0.8–1.2)
Prothrombin Time: 14.4 seconds (ref 11.4–15.2)

## 2021-01-29 MED ORDER — ENOXAPARIN SODIUM 100 MG/ML IJ SOSY: 100.0000 mg | PREFILLED_SYRINGE | Freq: Two times a day (BID) | INTRAMUSCULAR | 0 refills | Status: DC

## 2021-01-29 MED ORDER — WARFARIN SODIUM 5 MG PO TABS: 7.5000 mg | ORAL_TABLET | Freq: Every day | ORAL | 0 refills | Status: DC

## 2021-01-29 NOTE — Progress Notes (Signed)
Pt being d/c, VSS, education provided, IV removed.   Chrisandra Carota, RN 01/29/2021 12:36 PM

## 2021-01-29 NOTE — TOC CM/SW Note (Addendum)
MD request benefit check for Lovenox.  Telephone call made to [facility] in [place]. Copay with patient's insurance will be 95.81 for prescription Lovenox for 5 days as written.  Marthenia Rolling, MSN, RN,BSN Inpatient [facility] Case Manager [phone]

## 2021-01-29 NOTE — Discharge Summary (Signed)
Physician Discharge Summary  Angela Cummings [id number] DOB: 12-27-1955 DOA: 01/26/2021  PCP: Horald Pollen, MD  Admit date: 01/26/2021 Discharge date: 01/29/2021  Admitted From: Home Disposition: Home  Recommendations for Outpatient Follow-up:  Follow up with PCP in 1 week Follow up with Coumadin Clinic (appointment will be set up early this upcoming week) Follow up with hematology Please obtain BMP/CBC in one week Please follow up on the following pending results: None  Home Health: None Equipment/Devices: None  Discharge Condition: Stable CODE STATUS: Full code Diet recommendation: Regular diet  Brief/Interim Summary:  Admission HPI written by Rhetta Mura, DO   HPI: Angela Cummings is a 65 y.o. female with medical history significant for paroxysmal atrial fibrillation chronically anticoagulated on Xarelto, mild intermittent asthma, GERD, who is admitted to [facility] on 01/26/2021 with acute DVT of the left lower extremity in spite of compliance with home Xarelto after presenting from home to [facility] ED complaining of left lower extremity pain.    The patient reports 2 weeks of progressive left calf tenderness associated with new onset swelling.  Denies any associated overt lower extremity erythema, and also denies any associated acute focal weakness, numbness, or paresthesias.  She to car rides to [place] over the last month, with initial heart rate occurring around July 12 in the subsequent car ride occurring around July 26.  The patient notes that both of these car rides lasted approximately 9 hours, with the patient not stopping for any breaks throughout that process.  Otherwise, denies any recent periods of prolonged diminished ambulatory status.  She also denies any recent trauma or surgical procedures.  No known history of underlying malignancy, and the patient reports that she is up-to-date on her screening colonoscopy as well as mammograms, and is  scheduled for her next such round of screening colonoscopy/mammogram later this year.  No personal or family history of prior DVT/PE, no the patient confirms good compliance with her home Xarelto, which she has been taking over the course of the last 3 years for thromboembolic prophylaxis in the setting of a history of paroxysmal atrial fibrillation.  She conveys that since the initiation of her Xarelto 3 years ago, that she does not believe that she has missed a single dose of this anticoagulant.    She denies any associated chest pain, palpitations, shortness breath, diaphoresis, nausea, vomiting, dizziness, presyncope, syncope, or hemoptysis.  She also denies any recent subjective fever, chills, rigors, or generalized myalgias.  No recent headache, neck stiffness, rhinitis, rhinorrhea, sore throat, wheezing, abdominal pain, diarrhea, or rash.  No known recent COVID-19 exposures.  Denies any recent orthopnea or PND.   In the setting of the above symptoms, the patient's PCP ordered venous Doppler of the left lower extremity, which was completed as an outpatient earlier today and demonstrated evidence of acute DVT involving the left popliteal vein, left posterior tibial vein, and left peroneal vein as well as showing age-indeterminate DVT involving the left femoral vein, while right lower extremity venous Doppler showed no evidence of acute DVT.  In the setting of the above results, patient's PCP recommended that she present to [facility] emergency department for further evaluation of acute left lower extremity DVT that occurred in spite of outstanding compliance with her chronic anticoagulation with Xarelto.   Hospital course:  Acute DVT of LLE In setting of Xarelto use and prolonged immobilization. Patient was on Xarelto 20 mg daily prior to admission. Vascular surgery consulted secondary to  concern for extensive clot. Vascular surgery ordered venous ultrasound of IVC/Iliac veins which was negative and  have signed off. Per discussion with vascular, no preference between Eliquis and Coumadin; they have also recommended thigh high stockings. No clinical symptoms to suggest PE. Discussed with hematology who recommended transition to coumadin. Patient transitioned from heparin IV to Coumadin with a Lovenox bridge. Patient discharged on Coumadin 7.5 mg daily and Lovenox 100 mg BID with recommendation to have her INR checked in 2-3 days; her cardiologist's coumadin clinic will call her for an appointment. Continue thigh high stockings to LLE. Hematology follow-up.   Paroxysmal atrial fibrillation Currently in atrial fibrillation. On diltiazem and Toprol XL and Xarelto as an outpatient. Continue diltiazem and Toprol XL. Xarelto discontinued and she is now on coumadin.   Asthma On albuterol prn   Anxiety Continue lorazepam   GERD Patient is on Protonix  Discharge Diagnoses:  Principal Problem:   Acute deep vein thrombosis (DVT) (HCC) Active Problems:   PAF (paroxysmal atrial fibrillation) (HCC)   Asthma   GERD (gastroesophageal reflux disease)   GAD (generalized anxiety disorder)    Discharge Instructions  Discharge Instructions     Call MD for:  difficulty breathing, headache or visual disturbances   Complete by: As directed    Call MD for:  severe uncontrolled pain   Complete by: As directed    Increase activity slowly   Complete by: As directed       Allergies as of 01/29/2021       Reactions   Adhesive [tape] Itching, Rash   Paper tape is better but best to use no adhesive if possible        Medication List     STOP taking these medications    ibuprofen 200 MG tablet Commonly known as: ADVIL   Xarelto 20 MG Tabs tablet Generic drug: rivaroxaban       TAKE these medications    acetaminophen 500 MG tablet Commonly known as: TYLENOL Take 1,000 mg by mouth every 6 (six) hours as needed for moderate pain or headache.   CENTRUM WOMEN PO Take 1 tablet by  mouth daily.   enoxaparin 100 MG/ML injection Commonly known as: LOVENOX Inject 1 mL (100 mg total) into the skin 2 (two) times daily for 5 days.   Klor-Con M20 20 MEQ tablet Generic drug: potassium chloride SA TAKE 2 TABLETS (40 MEQ TOTAL) 2 (TWO) TIMES DAILY BY MOUTH. What changed: See the new instructions.   LORazepam 1 MG tablet Commonly known as: ATIVAN Take 1 mg by mouth as needed for anxiety.   meloxicam 15 MG tablet Commonly known as: MOBIC Take 1 tablet (15 mg total) by mouth daily.   metoprolol succinate 25 MG 24 hr tablet Commonly known as: Toprol XL Take 0.5 tablets (12.5 mg total) by mouth daily.   pantoprazole 40 MG tablet Commonly known as: PROTONIX TAKE 1 TABLET BY MOUTH TWICE A DAY   Proventil HFA 108 (90 Base) MCG/ACT inhaler Generic drug: albuterol INHALE 2 PUFFS INTO THE LUNGS EVERY 6 HOURS AS NEEDED What changed: reasons to take this   Tiadylt ER 240 MG 24 hr capsule Generic drug: diltiazem TAKE 1 CAPSULE BY MOUTH EVERY DAY What changed: how much to take   warfarin 5 MG tablet Commonly known as: Coumadin Take 1.5 tablets (7.5 mg total) by mouth daily at 4 PM.        Follow-up Information     Sagardia, Ines Bloomer, MD. Schedule an appointment  as soon as possible for a visit in 1 week(s).   Specialty: Internal Medicine Why: For hospital follow-up Contact information: [place] [address] [id number]         Benay Pike, MD. Schedule an appointment as soon as possible for a visit in 4 week(s).   Specialty: Hematology and Oncology Why: For hospital follow-up for DVT while on Xarelto Contact information: [place] [address] [phone]                Allergies  Allergen Reactions   Adhesive [Tape] Itching and Rash    Paper tape is better but best to use no adhesive if possible    Consultations: Vascular surgery Hematology (phone)   Procedures/Studies: VAS [place] IVC/ILIAC (VENOUS  ONLY)  Result Date: 01/27/2021 IVC/ILIAC STUDY Patient Name:  Angela Cummings  Date of Exam:   01/27/2021 Medical Rec #: [id number]          Accession #:    [id number] Date of Birth: 11-13-1955           Patient Gender: F Patient Age:   65 years Exam Location:  [facility] Procedure:      VAS [place] IVC/ILIAC (VENOUS ONLY) Referring Phys: Harold Barban --------------------------------------------------------------------------------  Indications: DVT found left distal femoral vein, popliteal, posterior tibial,              and peroneal veins, 01/26/21 Other Factors: Patient currently anticoagulated on Xarelto for atrial                fibrillation.  Comparison Study: Prior lower extremity venous duplex done 01/26/21 Performing Technologist: Sharion Dove RVS  Examination Guidelines: A complete evaluation includes B-mode imaging, spectral Doppler, color Doppler, and power Doppler as needed of all accessible portions of each vessel. Bilateral testing is considered an integral part of a complete examination. Limited examinations for reoccurring indications may be performed as noted.  +-------------------+---------+-----------+---------+-----------+--------+         CIV        RT-PatentRT-ThrombusLT-PatentLT-ThrombusComments +-------------------+---------+-----------+---------+-----------+--------+ Common Iliac Mid                        patent                      +-------------------+---------+-----------+---------+-----------+--------+ Common Iliac Distal                     patent                      +-------------------+---------+-----------+---------+-----------+--------+  +------------------------+---------+-----------+---------+-----------+--------+           EIV           RT-PatentRT-ThrombusLT-PatentLT-ThrombusComments +------------------------+---------+-----------+---------+-----------+--------+ External Iliac Vein Prox patent              patent                       +------------------------+---------+-----------+---------+-----------+--------+   Summary: IVC/Iliac: No thrombus noted in the left external iliac and mid to distal common iliac veins.  *See table(s) above for measurements and observations.  Electronically signed by Servando Snare MD on 01/27/2021 at 1:36:59 PM.    Final    VAS [place] LOWER EXTREMITY VENOUS (DVT)  Result Date: 01/26/2021  Lower Venous DVT Study Patient Name:  Angela Cummings  Date of Exam:   01/26/2021 Medical Rec #: [id number]  Accession #:    [id number] Date of Birth: 12/09/1955           Patient Gender: F Patient Age:   28 years Exam Location:  [facility] Procedure:      VAS [place] LOWER EXTREMITY VENOUS (DVT) Referring Phys: Butch Penny CARROLL --------------------------------------------------------------------------------  Indications: Pain, and Swelling.  Anticoagulation: Xarelto. Comparison Study: No prior Performing Technologist: Oda Cogan RDMS, RVT  Examination Guidelines: A complete evaluation includes B-mode imaging, spectral Doppler, color Doppler, and power Doppler as needed of all accessible portions of each vessel. Bilateral testing is considered an integral part of a complete examination. Limited examinations for reoccurring indications may be performed as noted. The reflux portion of the exam is performed with the patient in reverse Trendelenburg.  +-----+---------------+---------+-----------+----------+--------------+ RIGHTCompressibilityPhasicitySpontaneityPropertiesThrombus Aging +-----+---------------+---------+-----------+----------+--------------+ CFV  Full           Yes      Yes                                 +-----+---------------+---------+-----------+----------+--------------+ SFJ  Full                                                        +-----+---------------+---------+-----------+----------+--------------+   +---------+---------------+---------+-----------+----------+-----------------+  LEFT     CompressibilityPhasicitySpontaneityPropertiesThrombus Aging    +---------+---------------+---------+-----------+----------+-----------------+ CFV      Full           Yes      Yes                                    +---------+---------------+---------+-----------+----------+-----------------+ SFJ      Full                                                           +---------+---------------+---------+-----------+----------+-----------------+ FV Prox  Full                                                           +---------+---------------+---------+-----------+----------+-----------------+ FV Mid   Full                                                           +---------+---------------+---------+-----------+----------+-----------------+ FV DistalPartial                                      Age Indeterminate +---------+---------------+---------+-----------+----------+-----------------+ PFV      Full                                                           +---------+---------------+---------+-----------+----------+-----------------+  POP      None                               dilated   Acute             +---------+---------------+---------+-----------+----------+-----------------+ PTV      None                               dilated   Acute             +---------+---------------+---------+-----------+----------+-----------------+ PERO     None                               dilated   Acute             +---------+---------------+---------+-----------+----------+-----------------+    Summary: RIGHT: - No evidence of common femoral vein obstruction.  LEFT: - Findings consistent with acute deep vein thrombosis involving the left popliteal vein, left posterior tibial veins, and left peroneal veins. - Findings consistent with age indeterminate deep vein thrombosis involving the left femoral vein.  *See table(s) above for measurements and  observations. Electronically signed by Harold Barban MD on 01/26/2021 at 10:43:15 PM.    Final     LEFT LOWER EXTREMITY VENOUS VASCULAR ULTRASOUND (01/26/2021) Summary:  RIGHT:  - No evidence of common femoral vein obstruction.    LEFT:  - Findings consistent with acute deep vein thrombosis involving the left  popliteal vein, left posterior tibial veins, and left peroneal veins.  - Findings consistent with age indeterminate deep vein thrombosis  involving the left femoral vein.   IVC/ILIAC VENOUS ULTRASOUND Summary:  IVC/Iliac: No thrombus noted in the left external iliac and mid to distal  common iliac veins.   Subjective: No issues overnight. Did well with Lovenox injections. Leg swelling continues to improve.  Discharge Exam: Vitals:   01/29/21 0837 01/29/21 1216  BP: 118/81 99/77  Pulse: 84 76  Resp: 16 18  Temp: 98.4 F (36.9 C) 98.2 F (36.8 C)  SpO2: 95% 97%   Vitals:   01/29/21 0355 01/29/21 0607 01/29/21 0837 01/29/21 1216  BP: 106/60  118/81 99/77  Pulse:   84 76  Resp: '16  16 18  '$ Temp: 98.2 F (36.8 C)  98.4 F (36.9 C) 98.2 F (36.8 C)  TempSrc: Oral  Oral Oral  SpO2: 100%  95% 97%  Weight:  93.6 kg    Height:        General: Pt is alert, awake, not in acute distress Cardiovascular: Irregular rhythm, normal rate, S1/S2 +, no rubs, no gallops Respiratory: CTA bilaterally, no wheezing, no rhonchi Abdominal: Soft, NT, ND, bowel sounds + Extremities: trace LLE edema, no cyanosis    The results of significant diagnostics from this hospitalization (including imaging, microbiology, ancillary and laboratory) are listed below for reference.     Microbiology: Recent Results (from the past 240 hour(s))  Resp Panel by RT-PCR (Flu A&B, Covid) Nasopharyngeal Swab     Status: None   Collection Time: 01/26/21 11:17 AM   Specimen: Nasopharyngeal Swab; Nasopharyngeal(NP) swabs in vial transport medium  Result Value Ref Range Status   SARS Coronavirus 2 by RT  PCR NEGATIVE NEGATIVE Final    Comment: (NOTE) SARS-CoV-2 target nucleic acids are NOT DETECTED.  The SARS-CoV-2 RNA is generally detectable in upper respiratory specimens  during the acute phase of infection. The lowest concentration of SARS-CoV-2 viral copies this assay can detect is 138 copies/mL. A negative result does not preclude SARS-Cov-2 infection and should not be used as the sole basis for treatment or other patient management decisions. A negative result may occur with  improper specimen collection/handling, submission of specimen other than nasopharyngeal swab, presence of viral mutation(s) within the areas targeted by this assay, and inadequate number of viral copies(<138 copies/mL). A negative result must be combined with clinical observations, patient history, and epidemiological information. The expected result is Negative.  Fact Sheet for Patients:  EntrepreneurPulse.com.au  Fact Sheet for Healthcare Providers:  IncredibleEmployment.be  This test is no t yet approved or cleared by the [place] FDA and  has been authorized for detection and/or diagnosis of SARS-CoV-2 by FDA under an Emergency Use Authorization (EUA). This EUA will remain  in effect (meaning this test can be used) for the duration of the COVID-19 declaration under Section 564(b)(1) of the Act, 21 U.S.C.section 360bbb-3(b)(1), unless the authorization is terminated  or revoked sooner.       Influenza A by PCR NEGATIVE NEGATIVE Final   Influenza B by PCR NEGATIVE NEGATIVE Final    Comment: (NOTE) The Xpert Xpress SARS-CoV-2/FLU/RSV plus assay is intended as an aid in the diagnosis of influenza from Nasopharyngeal swab specimens and should not be used as a sole basis for treatment. Nasal washings and aspirates are unacceptable for Xpert Xpress SARS-CoV-2/FLU/RSV testing.  Fact Sheet for Patients: EntrepreneurPulse.com.au  Fact Sheet for  Healthcare Providers: IncredibleEmployment.be  This test is not yet approved or cleared by the [place] FDA and has been authorized for detection and/or diagnosis of SARS-CoV-2 by FDA under an Emergency Use Authorization (EUA). This EUA will remain in effect (meaning this test can be used) for the duration of the COVID-19 declaration under Section 564(b)(1) of the Act, 21 U.S.C. section 360bbb-3(b)(1), unless the authorization is terminated or revoked.  Performed at [facility], [place] [address], [place], [place] [address]      Labs: BNP (last 3 results) No results for input(s): BNP in the last 8760 hours. Basic Metabolic Panel: Recent Labs  Lab 01/26/21 1111 01/27/21 0346  NA 138 139  K 4.1 4.0  CL 103 104  CO2 27 25  GLUCOSE 98 104*  BUN 10 12  CREATININE 0.81 0.81  CALCIUM 9.4 9.0  MG  --  2.1   Liver Function Tests: Recent Labs  Lab 01/26/21 1111 01/27/21 0346  AST 21 18  ALT 18 15  ALKPHOS 171* 139*  BILITOT 0.4 0.5  PROT 7.0 5.8*  ALBUMIN 3.7 3.0*   No results for input(s): LIPASE, AMYLASE in the last 168 hours. No results for input(s): AMMONIA in the last 168 hours. CBC: Recent Labs  Lab 01/26/21 1111 01/27/21 0345 01/28/21 0217 01/29/21 0202  WBC 6.1 6.4 6.0 5.6  NEUTROABS 3.6  --   --   --   HGB 13.1 11.6* 11.6* 11.3*  HCT 41.6 37.2 37.2 35.8*  MCV 85.8 85.5 85.3 84.8  PLT 295 254 272 244   Cardiac Enzymes: No results for input(s): CKTOTAL, CKMB, CKMBINDEX, TROPONINI in the last 168 hours. BNP: Invalid input(s): POCBNP CBG: No results for input(s): GLUCAP in the last 168 hours. D-Dimer No results for input(s): DDIMER in the last 72 hours. Hgb A1c No results for input(s): HGBA1C in the last 72 hours. Lipid Profile No results for input(s): CHOL, HDL, LDLCALC, TRIG, CHOLHDL,  LDLDIRECT in the last 72 hours. Thyroid function studies No results for input(s): TSH, T4TOTAL, T3FREE, THYROIDAB in the last 72  hours.  Invalid input(s): FREET3 Anemia work up No results for input(s): VITAMINB12, FOLATE, FERRITIN, TIBC, IRON, RETICCTPCT in the last 72 hours. Urinalysis    Component Value Date/Time   COLORURINE YELLOW 07/26/2012 0124   APPEARANCEUR CLEAR 07/26/2012 0124   LABSPEC 1.010 07/26/2012 0124   PHURINE 6.0 07/26/2012 0124   GLUCOSEU NEGATIVE 07/26/2012 0124   HGBUR SMALL (A) 07/26/2012 0124   BILIRUBINUR neg 05/26/2014 1016   KETONESUR NEGATIVE 07/26/2012 0124   PROTEINUR neg 05/26/2014 1016   PROTEINUR NEGATIVE 07/26/2012 0124   UROBILINOGEN 0.2 05/26/2014 1016   UROBILINOGEN 1.0 07/26/2012 0124   NITRITE .neg 05/26/2014 1016   NITRITE NEGATIVE 07/26/2012 0124   LEUKOCYTESUR Negative 05/26/2014 1016   Sepsis Labs Invalid input(s): PROCALCITONIN,  WBC,  LACTICIDVEN Microbiology Recent Results (from the past 240 hour(s))  Resp Panel by RT-PCR (Flu A&B, Covid) Nasopharyngeal Swab     Status: None   Collection Time: 01/26/21 11:17 AM   Specimen: Nasopharyngeal Swab; Nasopharyngeal(NP) swabs in vial transport medium  Result Value Ref Range Status   SARS Coronavirus 2 by RT PCR NEGATIVE NEGATIVE Final    Comment: (NOTE) SARS-CoV-2 target nucleic acids are NOT DETECTED.  The SARS-CoV-2 RNA is generally detectable in upper respiratory specimens during the acute phase of infection. The lowest concentration of SARS-CoV-2 viral copies this assay can detect is 138 copies/mL. A negative result does not preclude SARS-Cov-2 infection and should not be used as the sole basis for treatment or other patient management decisions. A negative result may occur with  improper specimen collection/handling, submission of specimen other than nasopharyngeal swab, presence of viral mutation(s) within the areas targeted by this assay, and inadequate number of viral copies(<138 copies/mL). A negative result must be combined with clinical observations, patient history, and  epidemiological information. The expected result is Negative.  Fact Sheet for Patients:  EntrepreneurPulse.com.au  Fact Sheet for Healthcare Providers:  IncredibleEmployment.be  This test is no t yet approved or cleared by the [place] FDA and  has been authorized for detection and/or diagnosis of SARS-CoV-2 by FDA under an Emergency Use Authorization (EUA). This EUA will remain  in effect (meaning this test can be used) for the duration of the COVID-19 declaration under Section 564(b)(1) of the Act, 21 U.S.C.section 360bbb-3(b)(1), unless the authorization is terminated  or revoked sooner.       Influenza A by PCR NEGATIVE NEGATIVE Final   Influenza B by PCR NEGATIVE NEGATIVE Final    Comment: (NOTE) The Xpert Xpress SARS-CoV-2/FLU/RSV plus assay is intended as an aid in the diagnosis of influenza from Nasopharyngeal swab specimens and should not be used as a sole basis for treatment. Nasal washings and aspirates are unacceptable for Xpert Xpress SARS-CoV-2/FLU/RSV testing.  Fact Sheet for Patients: EntrepreneurPulse.com.au  Fact Sheet for Healthcare Providers: IncredibleEmployment.be  This test is not yet approved or cleared by the [place] FDA and has been authorized for detection and/or diagnosis of SARS-CoV-2 by FDA under an Emergency Use Authorization (EUA). This EUA will remain in effect (meaning this test can be used) for the duration of the COVID-19 declaration under Section 564(b)(1) of the Act, 21 U.S.C. section 360bbb-3(b)(1), unless the authorization is terminated or revoked.  Performed at [facility], [place] [address] [place], [place] [address]      Time coordinating discharge: 35 minutes  SIGNED:  Cordelia Poche, MD [facility] 01/29/2021, 12:27 PM

## 2021-01-31 ENCOUNTER — Ambulatory Visit (INDEPENDENT_AMBULATORY_CARE_PROVIDER_SITE_OTHER): Payer: Medicare Other | Admitting: Pharmacist

## 2021-01-31 ENCOUNTER — Other Ambulatory Visit: Payer: Self-pay

## 2021-01-31 DIAGNOSIS — I824Y2 Acute embolism and thrombosis of unspecified deep veins of left proximal lower extremity: Secondary | ICD-10-CM | POA: Diagnosis not present

## 2021-01-31 DIAGNOSIS — Z7901 Long term (current) use of anticoagulants: Secondary | ICD-10-CM

## 2021-01-31 DIAGNOSIS — I480 Paroxysmal atrial fibrillation: Secondary | ICD-10-CM | POA: Diagnosis not present

## 2021-01-31 LAB — POCT INR: INR: 3.2 — AB (ref 2.0–3.0)

## 2021-01-31 MED ORDER — WARFARIN SODIUM 1 MG PO TABS: 1.0000 mg | ORAL_TABLET | Freq: Every day | ORAL | 0 refills | Status: DC

## 2021-01-31 NOTE — Patient Instructions (Signed)
STOP Lovenox. Do not take any warfarin tonight. Then start taking warfarin '1mg'$  (1 tablet) daily. Call us at [phone] with any questions, changes in medications, or upcoming procedures.

## 2021-02-04 ENCOUNTER — Ambulatory Visit (INDEPENDENT_AMBULATORY_CARE_PROVIDER_SITE_OTHER): Payer: Medicare Other

## 2021-02-04 ENCOUNTER — Other Ambulatory Visit: Payer: Self-pay

## 2021-02-04 DIAGNOSIS — I824Y2 Acute embolism and thrombosis of unspecified deep veins of left proximal lower extremity: Secondary | ICD-10-CM | POA: Diagnosis not present

## 2021-02-04 DIAGNOSIS — I480 Paroxysmal atrial fibrillation: Secondary | ICD-10-CM | POA: Diagnosis not present

## 2021-02-04 DIAGNOSIS — Z7901 Long term (current) use of anticoagulants: Secondary | ICD-10-CM | POA: Diagnosis not present

## 2021-02-04 LAB — POCT INR: INR: 1.9 — AB (ref 2.0–3.0)

## 2021-02-04 NOTE — Patient Instructions (Signed)
Description   Start taking warfarin '1mg'$  (1 tablet) daily except '2mg'$  (2 tablets) on Mondays and Fridays. Call us at [phone] with any questions, changes in medications, or upcoming procedures.

## 2021-02-10 ENCOUNTER — Ambulatory Visit (HOSPITAL_COMMUNITY)
Admission: RE | Admit: 2021-02-10 | Discharge: 2021-02-10 | Disposition: A | Discharge status: Home / Self Care | Payer: Medicare Other | Source: Ambulatory Visit | Attending: Nurse Practitioner | Admitting: Nurse Practitioner

## 2021-02-10 ENCOUNTER — Other Ambulatory Visit: Payer: Self-pay

## 2021-02-10 DIAGNOSIS — M7989 Other specified soft tissue disorders: Secondary | ICD-10-CM | POA: Diagnosis not present

## 2021-02-10 DIAGNOSIS — I480 Paroxysmal atrial fibrillation: Secondary | ICD-10-CM | POA: Insufficient documentation

## 2021-02-10 LAB — ECHOCARDIOGRAM COMPLETE
AR max vel: 2.26 cm2
AV Area VTI: 2.16 cm2
AV Area mean vel: 2.18 cm2
AV Mean grad: 3 mmHg
AV Peak grad: 5.9 mmHg
Ao pk vel: 1.21 m/s
S' Lateral: 3.4 cm

## 2021-02-11 ENCOUNTER — Encounter (HOSPITAL_COMMUNITY): Payer: Self-pay | Admitting: *Deleted

## 2021-02-11 ENCOUNTER — Ambulatory Visit: Payer: Medicare Other | Admitting: *Deleted

## 2021-02-11 DIAGNOSIS — I824Y2 Acute embolism and thrombosis of unspecified deep veins of left proximal lower extremity: Secondary | ICD-10-CM | POA: Diagnosis not present

## 2021-02-11 DIAGNOSIS — I480 Paroxysmal atrial fibrillation: Secondary | ICD-10-CM

## 2021-02-11 DIAGNOSIS — Z7901 Long term (current) use of anticoagulants: Secondary | ICD-10-CM | POA: Diagnosis not present

## 2021-02-11 LAB — POCT INR: INR: 1.2 — AB (ref 2.0–3.0)

## 2021-02-11 MED ORDER — WARFARIN SODIUM 1 MG PO TABS: ORAL_TABLET | ORAL | 1 refills | Status: DC

## 2021-02-11 NOTE — Patient Instructions (Signed)
Description   Today take 2.5 mg (2.5 tablets) and tomorrow take 1.'5mg'$  (1.5 tablets) then start taking warfarin '1mg'$  (1 tablet) daily except '2mg'$  (2 tablets) on Mondays, Wednesdays, and Fridays. Call us at [phone] with any questions, changes in medications, or upcoming procedures.

## 2021-02-15 ENCOUNTER — Other Ambulatory Visit: Payer: Self-pay

## 2021-02-15 ENCOUNTER — Ambulatory Visit (INDEPENDENT_AMBULATORY_CARE_PROVIDER_SITE_OTHER): Payer: Medicare Other | Admitting: Internal Medicine

## 2021-02-15 ENCOUNTER — Encounter: Payer: Self-pay | Admitting: Internal Medicine

## 2021-02-15 VITALS — BP 105/68 | HR 82 | Resp 16 | Ht 61.0 in | Wt 209.2 lb

## 2021-02-15 DIAGNOSIS — Z7901 Long term (current) use of anticoagulants: Secondary | ICD-10-CM | POA: Diagnosis not present

## 2021-02-15 DIAGNOSIS — M797 Fibromyalgia: Secondary | ICD-10-CM

## 2021-02-15 DIAGNOSIS — I824Y2 Acute embolism and thrombosis of unspecified deep veins of left proximal lower extremity: Secondary | ICD-10-CM

## 2021-02-15 DIAGNOSIS — M7551 Bursitis of right shoulder: Secondary | ICD-10-CM | POA: Diagnosis not present

## 2021-02-15 MED ORDER — LIDOCAINE HCL 1 % IJ SOLN
3.0000 mL | INTRAMUSCULAR | Status: AC | PRN
  Administered 2021-02-15: 3 mL

## 2021-02-15 MED ORDER — TRIAMCINOLONE ACETONIDE 40 MG/ML IJ SUSP
40.0000 mg | INTRAMUSCULAR | Status: AC | PRN
  Administered 2021-02-15: 40 mg via INTRA_ARTICULAR

## 2021-02-15 NOTE — Patient Instructions (Signed)
Joint Steroid Injection A joint steroid injection is a procedure to relieve swelling and pain in a joint. Steroids are medicines that reduce inflammation. In this procedure, your health care provider uses a syringe and a needle to inject a steroid medicine into a painful and inflamed joint. A pain-relieving medicine (anesthetic) may be injected along with the steroid. In some cases, your health care provider may use an imaging technique such as ultrasound or fluoroscopy to guide the injection. Joints that are often treated with steroid injections include the knee, shoulder, hip, and spine. These injections may also be used in the elbow, ankle, and joints of the hands or feet. You may have joint steroid injections as part of your treatment for inflammation caused by: Gout. Rheumatoid arthritis. Advanced wear-and-tear arthritis (osteoarthritis). Tendinitis. Bursitis. Joint steroid injections may be repeated, but having them too often can damage a joint or the skin over the joint. You should not have joint steroid injections less than 6 weeks apart or more than four times a year. Tell a health care provider about: Any allergies you have. All medicines you are taking, including vitamins, herbs, eye drops, creams, and over-the-counter medicines. Any problems you or family members have had with anesthetic medicines. Any blood disorders you have. Any surgeries you have had. Any medical conditions you have. Whether you are pregnant or may be pregnant. What are the risks? Generally, this is a safe treatment. However, problems may occur, including: Infection. Bleeding. Allergic reactions to medicines. Damage to the joint or tissues around the joint. Thinning of skin or loss of skin color over the joint. Temporary flushing of the face or chest. Temporary increase in pain. Temporary increase in blood sugar. Failure to relieve inflammation or pain. What happens before the treatment? Medicines Ask  your health care provider about: Changing or stopping your regular medicines. This is especially important if you are taking diabetes medicines or blood thinners. Taking medicines such as aspirin and ibuprofen. These medicines can thin your blood. Do not take these medicines unless your health care provider tells you to take them. Taking over-the-counter medicines, vitamins, herbs, and supplements. General instructions You may have imaging tests of your joint. Ask your health care provider if you can drive yourself home after the procedure. What happens during the treatment?  Your health care provider will position you for the injection and locate the injection site over your joint. The skin over the joint will be cleaned with a germ-killing soap. Your health care provider may: Spray a numbing solution (topical anesthetic) over the injection site. Inject a local anesthetic under the skin above your joint. The needle will be placed through your skin into your joint. Your health care provider may use imaging to guide the needle to the right spot for the injection. If imaging is used, a special contrast dye may be injected to confirm that the needle is in the correct location. The steroid medicine will be injected into your joint. Anesthetic may be injected along with the steroid. This may be a medicine that relieves pain for a short time (short-acting anesthetic) or for a longer time (long-acting anesthetic). The needle will be removed, and an adhesive bandage (dressing) will be placed over the injection site. The procedure may vary among health care providers and hospitals. What can I expect after the treatment? You will be able to go home after the treatment. It is normal to feel slight flushing for a few days after the injection. After the treatment, it is   common to have an increase in joint pain after the anesthetic has worn off. This may happen about an hour after a short-acting anesthetic  or about 8 hours after a longer-acting anesthetic. You should begin to feel relief from joint pain and swelling after 24 to 48 hours. Contact your health care provider if you do not begin to feel relief after 2 days. Follow these instructions at home: Injection site care Leave the adhesive dressing over your injection site in place until your health care provider says you can remove it. Check your injection site every day for signs of infection. Check for: More redness, swelling, or pain. Fluid or blood. Warmth. Pus or a bad smell. Activity Return to your normal activities as told by your health care provider. Ask your health care provider what activities are safe for you. You may be asked to limit activities that put stress on the joint for a few days. Do joint exercises as told by your health care provider. Do not take baths, swim, or use a hot tub until your health care provider approves. Ask your health care provider if you may take showers. You may only be allowed to take sponge baths. Managing pain, stiffness, and swelling  If directed, put ice on the joint. To do this: Put ice in a plastic bag. Place a towel between your skin and the bag. Leave the ice on for 20 minutes, 2-3 times a day. Remove the ice if your skin turns bright red. This is very important. If you cannot feel pain, heat, or cold, you have a greater risk of damage to the area. Raise (elevate) your joint above the level of your heart when you are sitting or lying down. General instructions Take over-the-counter and prescription medicines only as told by your health care provider. Do not use any products that contain nicotine or tobacco, such as cigarettes, e-cigarettes, and chewing tobacco. These can delay joint healing. If you need help quitting, ask your health care provider. If you have diabetes, be aware that your blood sugar may be slightly elevated for several days after the injection. Keep all follow-up visits.  This is important. Contact a health care provider if you have: Chills or a fever. Any signs of infection at your injection site. Increased pain or swelling or no relief after 2 days. Summary A joint steroid injection is a treatment to relieve pain and swelling in a joint. Steroids are medicines that reduce inflammation. Your health care provider may add an anesthetic along with the steroid. You may have joint steroid injections as part of your arthritis treatment. Joint steroid injections may be repeated, but having them too often can damage a joint or the skin over the joint. Contact your health care provider if you have a fever, chills, or signs of infection, or if you get no relief from joint pain or swelling.

## 2021-02-15 NOTE — Progress Notes (Signed)
Office Visit Note  Patient: Angela Cummings             Date of Birth: 03-02-1956           MRN: [id number]             PCP: Horald Pollen, MD Referring: Horald Pollen, * Visit Date: 02/15/2021   Subjective:  Pain of the Right Shoulder (Pain, wants injection)   History of Present Illness: Angela Cummings is a 65 y.o. female here for follow up for generalized osteoarthritis. Since our last visit she suffered acute DVT with change in anticoagulant treatment form apixaban to warfarin. This was provoked by a continuous long distance drive for 9 hours to [place] due to death of her brother in law and sister in law in rapid succession. INR has been in and out of goal working on this since hospitalization. She has increased right shoulder pain and difficulty raising overhead since laying in the hospital bed at the ED and admission.   Previous HPI: 11/24/20 Angela Cummings is a 65 y.o. female here for follow up of osteoarthritis with right shoulder and left knee injections about 3 months ago and possible fibromyalgia syndrome in exacerbation due to stressors. She feels that the injections were very helpful, between these and NSAID use her pain was nearly completely improved at least for a time. She reports today that the diclofenac-misoprostol is prohibitively costly, requests if meloxicam is an option after discussion with her pharmacy.   Review of Systems  Constitutional:  Positive for fatigue.  HENT:  Negative for mouth dryness.   Eyes:  Negative for dryness.  Respiratory:  Negative for shortness of breath.   Cardiovascular:  Positive for swelling in legs/feet.  Gastrointestinal:  Negative for constipation.  Endocrine: Negative for excessive thirst.  Genitourinary:  Negative for difficulty urinating.  Musculoskeletal:  Positive for joint pain, joint pain, joint swelling, muscle weakness, morning stiffness and muscle tenderness.  Skin:  Negative for rash.   Allergic/Immunologic: Negative for susceptible to infections.  Neurological:  Negative for numbness.  Hematological:  Negative for bruising/bleeding tendency.  Psychiatric/Behavioral:  Positive for sleep disturbance.    PMFS History:  Patient Active Problem List   Diagnosis Date Noted   Long term (current) use of anticoagulants 01/31/2021   GAD (generalized anxiety disorder) 01/27/2021   Acute deep vein thrombosis (DVT) ([place]) 01/26/2021   Low back pain 11/24/2020   Osteoarthritis of left foot 10/06/2017   GERD (gastroesophageal reflux disease) 08/23/2017   Bursitis of right shoulder 11/07/2015   Fibromyalgia 03/14/2015   Degenerative disc disease, cervical 03/14/2015   Primary osteoarthritis involving multiple joints 03/14/2015   Depression 02/18/2014   Asthma    PAF (paroxysmal atrial fibrillation) ([place]) 11/28/2010   Obesity 11/28/2010    Past Medical History:  Diagnosis Date   Anxiety    Asthma    Atrial fibrillation ([place]) 11/2010   paroxysmal   Bradycardia    Depression    DJD (degenerative joint disease)    Cervical spine   Fibromyalgia    s/p rheumatology consultation/[facility]   Mild sleep apnea    Obesity    Osteoarthritis    Shoulders, hips,knees    Family History  Problem Relation Age of Onset   Emphysema Mother    COPD Mother    Anxiety disorder Mother    Cancer Father        lung cancer; non-smoker   Heart disease Father 81  AMI   COPD Sister    Emphysema Brother    Cancer Brother    Cancer Daughter    Cancer Sister        lung cancer   Heart disease Sister        AMI   Diabetes Other    Thyroid disease Other    Past Surgical History:  Procedure Laterality Date   ABLATION  08/2012   PVI by Dr [name]   ABLATION  11/13/2013   PVI by Dr [name]   ATRIAL FIBRILLATION ABLATION N/A 09/03/2012   Procedure: ATRIAL FIBRILLATION ABLATION;  Surgeon: Thompson Grayer, MD;  Location: [facility] CATH LAB;  Service: Cardiovascular;  Laterality: N/A;   ATRIAL  FIBRILLATION ABLATION N/A 11/13/2013   Procedure: ATRIAL FIBRILLATION ABLATION;  Surgeon: Coralyn Mark, MD;  Location: [place] CATH LAB;  Service: Cardiovascular;  Laterality: N/A;   CARDIOVERSION  03/13/2012   Procedure: CARDIOVERSION;  Surgeon: [name], MD;  Location: [place];  Service: Cardiovascular;  Laterality: N/A;   CARDIOVERSION N/A 07/26/2012   Procedure: CARDIOVERSION;  Surgeon: Thompson Grayer, MD;  Location: [place];  Service: Cardiovascular;  Laterality: N/A;   CARDIOVERSION N/A 02/20/2014   Procedure: CARDIOVERSION;  Surgeon: Larey Dresser, MD;  Location: [place];  Service: Cardiovascular;  Laterality: N/A;   CESAREAN SECTION     CHOLECYSTECTOMY     HERNIA REPAIR     KNEE SURGERY     neck tumor resection     benign   TEE WITHOUT CARDIOVERSION  03/13/2012   Procedure: TRANSESOPHAGEAL ECHOCARDIOGRAM (TEE);  Surgeon: [name], MD;  Location: [place];  Service: Cardiovascular;  Laterality: N/A;  Rm 2029   TEE WITHOUT CARDIOVERSION N/A 09/02/2012   Procedure: TRANSESOPHAGEAL ECHOCARDIOGRAM (TEE);  Surgeon: Josue Hector, MD;  Location: [place];  Service: Cardiovascular;  Laterality: N/A;   TEE WITHOUT CARDIOVERSION N/A 11/13/2013   Procedure: TRANSESOPHAGEAL ECHOCARDIOGRAM (TEE);  Surgeon: [name], MD;  Location: [facility] ENDOSCOPY;  Service: Cardiovascular;  Laterality: N/A;   Social History   Social History Narrative   Marital status: widowed since 01/2014 of melanoma; not dating.       Children:  3 children (17,30, 32) and 4 grandchildren and 1 gg. 1 adopted child.       Lives with son.  Lives in [place].        Employment:  Unemployed.       Tobacco: none      Alcohol: none      Drugs: none      Exercise: none   Immunization History  Administered Date(s) Administered   Fluad Quad(high Dose 65+) 03/11/2019, 04/08/2020   Influenza,inj,Quad PF,6+ Mos 03/09/2013, 05/26/2014, 03/14/2015   Influenza,inj,quad, With Preservative 08/27/2017    PFIZER(Purple Top)SARS-COV-2 Vaccination 08/26/2019, 09/19/2019   Pneumococcal Polysaccharide-23 06/25/2015, 10/07/2020   Tdap 03/14/2015     Objective: Vital Signs: BP 105/68 (BP Location: Left Arm, Patient Position: Sitting, Cuff Size: Normal)   Pulse 82   Resp 16   Ht '5\' 1"'$  (1.549 m)   Wt 209 lb 3.2 oz (94.9 kg)   BMI 39.53 kg/m    Physical Exam Constitutional:      Appearance: She is obese.  Cardiovascular:     Rate and Rhythm: Normal rate and regular rhythm.  Pulmonary:     Effort: Pulmonary effort is normal.     Breath sounds: Normal breath sounds.  Skin:    General: Skin is warm and dry.     Findings:  No rash.  Neurological:     Mental Status: She is alert.  Psychiatric:        Mood and Affect: Mood normal.     Musculoskeletal Exam:  Neck full ROM no tenderness Right shoulder full ROM intact but painful with passive and active abduction overhead and reaching behind back, left shoulder normal Elbows full ROM no tenderness or swelling Wrists full ROM no tenderness or swelling Fingers full ROM no tenderness or swelling   Investigation: No additional findings.  Imaging: VAS [place] IVC/ILIAC (VENOUS ONLY)  Result Date: 01/27/2021 IVC/ILIAC STUDY Patient Name:  Serita Kyle  Date of Exam:   01/27/2021 Medical Rec #: [id number]          Accession #:    [id number] Date of Birth: [birth year]           Patient Gender: F Patient Age:   29 years Exam Location:  [facility] Procedure:      VAS [place] IVC/ILIAC (VENOUS ONLY) Referring Phys: Harold Barban --------------------------------------------------------------------------------  Indications: DVT found left distal femoral vein, popliteal, posterior tibial,              and peroneal veins, 01/26/21 Other Factors: Patient currently anticoagulated on Xarelto for atrial                fibrillation.  Comparison Study: Prior lower extremity venous duplex done 01/26/21 Performing Technologist: Sharion Dove RVS  Examination  Guidelines: A complete evaluation includes B-mode imaging, spectral Doppler, color Doppler, and power Doppler as needed of all accessible portions of each vessel. Bilateral testing is considered an integral part of a complete examination. Limited examinations for reoccurring indications may be performed as noted.  +-------------------+---------+-----------+---------+-----------+--------+         CIV        RT-PatentRT-ThrombusLT-PatentLT-ThrombusComments +-------------------+---------+-----------+---------+-----------+--------+ Common Iliac Mid                        patent                      +-------------------+---------+-----------+---------+-----------+--------+ Common Iliac Distal                     patent                      +-------------------+---------+-----------+---------+-----------+--------+  +------------------------+---------+-----------+---------+-----------+--------+           EIV           RT-PatentRT-ThrombusLT-PatentLT-ThrombusComments +------------------------+---------+-----------+---------+-----------+--------+ External Iliac Vein Prox patent              patent                      +------------------------+---------+-----------+---------+-----------+--------+   Summary: IVC/Iliac: No thrombus noted in the left external iliac and mid to distal common iliac veins.  *See table(s) above for measurements and observations.  Electronically signed by Servando Snare MD on 01/27/2021 at 1:36:59 PM.    Final    ECHOCARDIOGRAM COMPLETE  Result Date: 02/10/2021    ECHOCARDIOGRAM REPORT   Patient Name:   Serita Kyle Date of Exam: 02/10/2021 Medical Rec #:  [id number]         Height:       61.0 in Accession #:    [id number]        Weight:       206.3 lb Date of Birth:  1955/07/21  BSA:          1.914 m Patient Age:    64 years          BP:           125/77 mmHg Patient Gender: F                 HR:           93 bpm. Exam Location:  Outpatient Procedure: 2D  Echo, Cardiac Doppler and Color Doppler Indications:    Atrial Fibrillation  History:        Patient has prior history of Echocardiogram examinations, most                 recent 11/13/2013. H/O multiple ablations and cardioversions.                 Lower Extremity Swelling.  Sonographer:    Merrie Roof RDCS Referring Phys: [phone] [place]  1. Left ventricular ejection fraction, by estimation, is 50 to 55%. The left ventricle has low normal function. The left ventricle has no regional wall motion abnormalities. There is mild left ventricular hypertrophy. Left ventricular diastolic parameters are indeterminate.  2. Right ventricular systolic function is normal. The right ventricular size is normal. Tricuspid regurgitation signal is inadequate for assessing PA pressure.  3. Left atrial size was severely dilated.  4. The mitral valve is degenerative. Mild mitral valve regurgitation. No evidence of mitral stenosis. Moderate mitral annular calcification.  5. The aortic valve is tricuspid. Aortic valve regurgitation is trivial. No aortic stenosis is present. FINDINGS  Left Ventricle: Left ventricular ejection fraction, by estimation, is 50 to 55%. The left ventricle has low normal function. The left ventricle has no regional wall motion abnormalities. The left ventricular internal cavity size was normal in size. There is mild left ventricular hypertrophy. Left ventricular diastolic parameters are indeterminate. Right Ventricle: The right ventricular size is normal. No increase in right ventricular wall thickness. Right ventricular systolic function is normal. Tricuspid regurgitation signal is inadequate for assessing PA pressure. Left Atrium: Left atrial size was severely dilated. Right Atrium: Right atrial size was normal in size. Pericardium: There is no evidence of pericardial effusion. Mitral Valve: The mitral valve is degenerative in appearance. Moderate mitral annular calcification. Mild mitral  valve regurgitation. No evidence of mitral valve stenosis. The mean mitral valve gradient is 3.0 mmHg with average heart rate of 106 bpm. Tricuspid Valve: The tricuspid valve is normal in structure. Tricuspid valve regurgitation is trivial. No evidence of tricuspid stenosis. Aortic Valve: The aortic valve is tricuspid. Aortic valve regurgitation is trivial. No aortic stenosis is present. Aortic valve mean gradient measures 3.0 mmHg. Aortic valve peak gradient measures 5.9 mmHg. Aortic valve area, by VTI measures 2.16 cm. Pulmonic Valve: The pulmonic valve was normal in structure. Pulmonic valve regurgitation is trivial. No evidence of pulmonic stenosis. Aorta: The aortic root is normal in size and structure. Venous: The inferior vena cava was not well visualized. IAS/Shunts: No atrial level shunt detected by color flow Doppler.  LEFT VENTRICLE PLAX 2D LVIDd:         4.90 cm LVIDs:         3.40 cm LV PW:         1.10 cm LV IVS:        1.20 cm LVOT diam:     2.20 cm LV SV:         51 LV SV Index:  26 LVOT Area:     3.80 cm  RIGHT VENTRICLE RV Basal diam:  3.20 cm LEFT ATRIUM             Index       RIGHT ATRIUM           Index LA diam:        5.40 cm 2.82 cm/m  RA Area:     15.90 cm LA Vol (A2C):   91.6 ml 47.86 ml/m RA Volume:   40.80 ml  21.32 ml/m LA Vol (A4C):   89.8 ml 46.92 ml/m LA Biplane Vol: 97.3 ml 50.84 ml/m  AORTIC VALVE AV Area (Vmax):    2.26 cm AV Area (Vmean):   2.18 cm AV Area (VTI):     2.16 cm AV Vmax:           121.00 cm/s AV Vmean:          84.700 cm/s AV VTI:            0.234 m AV Peak Grad:      5.9 mmHg AV Mean Grad:      3.0 mmHg LVOT Vmax:         71.90 cm/s LVOT Vmean:        48.600 cm/s LVOT VTI:          0.133 m LVOT/AV VTI ratio: 0.57  AORTA Ao Root diam: 2.80 cm Ao Asc diam:  3.40 cm MITRAL VALVE MV Mean grad: 3.0 mmHg SHUNTS                        Systemic VTI:  0.13 m                        Systemic Diam: 2.20 cm Cherlynn Kaiser MD Electronically signed by Cherlynn Kaiser  MD Signature Date/Time: 02/10/2021/11:27:24 AM    Final    VAS [place] LOWER EXTREMITY VENOUS (DVT)  Result Date: 01/26/2021  Lower Venous DVT Study Patient Name:  Serita Kyle  Date of Exam:   01/26/2021 Medical Rec #: [id number]          Accession #:    [id number] Date of Birth: 1955-06-17           Patient Gender: F Patient Age:   22 years Exam [facility]  [facility] Procedure:      VAS [place] LOWER EXTREMITY VENOUS (DVT) Referring Phys: Butch Penny CARROLL --------------------------------------------------------------------------------  Indications: Pain, and Swelling.  Anticoagulation: Xarelto. Comparison Study: No prior Performing Technologist: Oda Cogan RDMS, RVT  Examination Guidelines: A complete evaluation includes B-mode imaging, spectral Doppler, color Doppler, and power Doppler as needed of all accessible portions of each vessel. Bilateral testing is considered an integral part of a complete examination. Limited examinations for reoccurring indications may be performed as noted. The reflux portion of the exam is performed with the patient in reverse Trendelenburg.  +-----+---------------+---------+-----------+----------+--------------+ RIGHTCompressibilityPhasicitySpontaneityPropertiesThrombus Aging +-----+---------------+---------+-----------+----------+--------------+ CFV  Full           Yes      Yes                                 +-----+---------------+---------+-----------+----------+--------------+ SFJ  Full                                                        +-----+---------------+---------+-----------+----------+--------------+   +---------+---------------+---------+-----------+----------+-----------------+  LEFT     CompressibilityPhasicitySpontaneityPropertiesThrombus Aging    +---------+---------------+---------+-----------+----------+-----------------+ CFV      Full           Yes      Yes                                     +---------+---------------+---------+-----------+----------+-----------------+ SFJ      Full                                                           +---------+---------------+---------+-----------+----------+-----------------+ FV Prox  Full                                                           +---------+---------------+---------+-----------+----------+-----------------+ FV Mid   Full                                                           +---------+---------------+---------+-----------+----------+-----------------+ FV DistalPartial                                      Age Indeterminate +---------+---------------+---------+-----------+----------+-----------------+ PFV      Full                                                           +---------+---------------+---------+-----------+----------+-----------------+ POP      None                               dilated   Acute             +---------+---------------+---------+-----------+----------+-----------------+ PTV      None                               dilated   Acute             +---------+---------------+---------+-----------+----------+-----------------+ PERO     None                               dilated   Acute             +---------+---------------+---------+-----------+----------+-----------------+    Summary: RIGHT: - No evidence of common femoral vein obstruction.  LEFT: - Findings consistent with acute deep vein thrombosis involving the left popliteal vein, left posterior tibial veins, and left peroneal veins. - Findings consistent with age indeterminate deep vein thrombosis involving the left femoral vein.  *See table(s) above for measurements and observations. Electronically signed by Harold Barban MD on 01/26/2021  at 10:43:15 PM.    Final     Recent Labs: Lab Results  Component Value Date   WBC 5.6 01/29/2021   HGB 11.3 (L) 01/29/2021   PLT 244 01/29/2021   NA 139 01/27/2021   K  4.0 01/27/2021   CL 104 01/27/2021   CO2 25 01/27/2021   GLUCOSE 104 (H) 01/27/2021   BUN 12 01/27/2021   CREATININE 0.81 01/27/2021   BILITOT 0.5 01/27/2021   ALKPHOS 139 (H) 01/27/2021   AST 18 01/27/2021   ALT 15 01/27/2021   PROT 5.8 (L) 01/27/2021   ALBUMIN 3.0 (L) 01/27/2021   CALCIUM 9.0 01/27/2021   GFRAA >60 07/21/2019    Speciality Comments: No specialty comments available.  Procedures:  Large Joint Inj: L subacromial bursa on 02/15/2021 2:23 PM Details: 25 G 1.5 in needle, lateral approach Medications: 3 mL lidocaine 1 %; 40 mg triamcinolone acetonide 40 MG/ML Outcome: tolerated well, no immediate complications Procedure, treatment alternatives, risks and benefits explained, specific risks discussed. Consent was given by the patient. Immediately prior to procedure a time out was called to verify the correct patient, procedure, equipment, support staff and site/side marked as required. Patient was prepped and draped in the usual sterile fashion.    Allergies: Adhesive [tape]   Assessment / Plan:     Visit Diagnoses: Bursitis of right shoulder  Right shoulder pain appears most consistent with bursitis or tendinopathy- pain with lying on one side, pain with abduction and external rotation localizes to side and back of shoulder. I don't see any evidence of inflammatory changes elsewhere and symptoms are focal. Local steroid injection performed today as she reported good improvement lasting months after last shot 6 months ago.  Long term (current) use of anticoagulants Acute deep vein thrombosis (DVT) of proximal vein of left lower extremity ([place])  New left leg DVT despite DOAC adherence provoked with immobility on long car rides and now transitioning to coumadin anticoagulation. Not at goal on recent values, still will benefit with minimizing systemic NSAID and steroid exposures when able.  Orders: Orders Placed This Encounter  Procedures   Large Joint Inj    No  orders of the defined types were placed in this encounter.    Follow-Up Instructions: Return if symptoms worsen or fail to improve.   Collier Salina, MD  Note - This record has been created using [employer].  Chart creation errors have been sought, but may not always  have been located. Such creation errors do not reflect on  the standard of medical care.

## 2021-02-18 ENCOUNTER — Ambulatory Visit: Payer: Medicare Other | Admitting: *Deleted

## 2021-02-18 ENCOUNTER — Other Ambulatory Visit: Payer: Self-pay

## 2021-02-18 DIAGNOSIS — Z7901 Long term (current) use of anticoagulants: Secondary | ICD-10-CM | POA: Diagnosis not present

## 2021-02-18 DIAGNOSIS — I480 Paroxysmal atrial fibrillation: Secondary | ICD-10-CM | POA: Diagnosis not present

## 2021-02-18 DIAGNOSIS — I824Y2 Acute embolism and thrombosis of unspecified deep veins of left proximal lower extremity: Secondary | ICD-10-CM

## 2021-02-18 LAB — POCT INR: INR: 1.2 — AB (ref 2.0–3.0)

## 2021-02-18 NOTE — Patient Instructions (Addendum)
Description   Today take 2.5 mg (2.5 tablets) and tomorrow take '2mg'$  (2 tablets) then start taking warfarin '2mg'$  (2 tablets) daily except '1mg'$  (1 tablet) on Saturdays. Call us at [phone] with any questions, changes in medications, or upcoming procedures.

## 2021-02-18 NOTE — Progress Notes (Signed)
Pt has 7 lovenox injections left. She has an active clot. Needs to resume lovenox since INR is low Will recheck Tuesday. If INR is low will need new Rx for lovenox.

## 2021-02-22 ENCOUNTER — Ambulatory Visit: Payer: Medicare Other | Admitting: *Deleted

## 2021-02-22 ENCOUNTER — Other Ambulatory Visit: Payer: Self-pay | Admitting: Internal Medicine

## 2021-02-22 ENCOUNTER — Other Ambulatory Visit: Payer: Self-pay

## 2021-02-22 DIAGNOSIS — I824Y2 Acute embolism and thrombosis of unspecified deep veins of left proximal lower extremity: Secondary | ICD-10-CM

## 2021-02-22 DIAGNOSIS — I480 Paroxysmal atrial fibrillation: Secondary | ICD-10-CM

## 2021-02-22 DIAGNOSIS — Z7901 Long term (current) use of anticoagulants: Secondary | ICD-10-CM

## 2021-02-22 LAB — POCT INR: INR: 1.3 — AB (ref 2.0–3.0)

## 2021-02-22 MED ORDER — ENOXAPARIN SODIUM 100 MG/ML IJ SOSY: 100.0000 mg | PREFILLED_SYRINGE | Freq: Two times a day (BID) | INTRAMUSCULAR | 1 refills | Status: DC

## 2021-02-22 NOTE — Patient Instructions (Addendum)
Description   Continue Lovenox '100mg'$  BID. Today take '5mg'$  and tomorrow take 3 mg (3 tablets of '1mg'$ )) then start taking '5mg'$  daily except 2.'5mg'$  (2.5 tablets of '1mg'$  tablets) on Monday, Wednesday, and Friday. Recheck on Monday. Call us at [phone] with any questions, changes in medications, or upcoming procedures.

## 2021-02-28 ENCOUNTER — Ambulatory Visit (INDEPENDENT_AMBULATORY_CARE_PROVIDER_SITE_OTHER): Payer: Medicare Other | Admitting: *Deleted

## 2021-02-28 ENCOUNTER — Other Ambulatory Visit: Payer: Self-pay

## 2021-02-28 DIAGNOSIS — I480 Paroxysmal atrial fibrillation: Secondary | ICD-10-CM | POA: Diagnosis not present

## 2021-02-28 DIAGNOSIS — I824Y2 Acute embolism and thrombosis of unspecified deep veins of left proximal lower extremity: Secondary | ICD-10-CM | POA: Diagnosis not present

## 2021-02-28 DIAGNOSIS — Z7901 Long term (current) use of anticoagulants: Secondary | ICD-10-CM

## 2021-02-28 LAB — POCT INR: INR: 1.8 — AB (ref 2.0–3.0)

## 2021-02-28 MED ORDER — WARFARIN SODIUM 5 MG PO TABS: ORAL_TABLET | ORAL | 1 refills | Status: DC

## 2021-02-28 NOTE — Patient Instructions (Signed)
Description   Continue Lovenox '100mg'$  BID until Thursday. Today take '5mg'$  then start taking '5mg'$  daily except 2.'5mg'$  (1/2 of 2.'5mg'$  tablet) on Mondays and Fridays. Recheck in 1 week. Call us at [phone] with any questions, changes in medications, or upcoming procedures.

## 2021-03-09 ENCOUNTER — Other Ambulatory Visit: Payer: Self-pay

## 2021-03-09 ENCOUNTER — Ambulatory Visit: Payer: Medicare Other

## 2021-03-09 DIAGNOSIS — I480 Paroxysmal atrial fibrillation: Secondary | ICD-10-CM | POA: Diagnosis not present

## 2021-03-09 DIAGNOSIS — I824Y2 Acute embolism and thrombosis of unspecified deep veins of left proximal lower extremity: Secondary | ICD-10-CM | POA: Diagnosis not present

## 2021-03-09 DIAGNOSIS — Z5181 Encounter for therapeutic drug level monitoring: Secondary | ICD-10-CM | POA: Diagnosis not present

## 2021-03-09 LAB — POCT INR: INR: 3.3 — AB (ref 2.0–3.0)

## 2021-03-09 NOTE — Patient Instructions (Signed)
Description   Hold today's dose and then continue taking 5mg  daily except 2.5mg  (1/2 of 2.5mg  tablet) on Mondays and Fridays. Recheck in 2 week. Call us at [phone] with any questions, changes in medications, or upcoming procedures.

## 2021-03-16 ENCOUNTER — Ambulatory Visit: Payer: Medicare Other

## 2021-03-16 ENCOUNTER — Other Ambulatory Visit: Payer: Self-pay

## 2021-03-16 DIAGNOSIS — I480 Paroxysmal atrial fibrillation: Secondary | ICD-10-CM

## 2021-03-16 DIAGNOSIS — I824Y2 Acute embolism and thrombosis of unspecified deep veins of left proximal lower extremity: Secondary | ICD-10-CM

## 2021-03-16 DIAGNOSIS — Z5181 Encounter for therapeutic drug level monitoring: Secondary | ICD-10-CM

## 2021-03-16 LAB — POCT INR: INR: 2.6 (ref 2.0–3.0)

## 2021-03-16 NOTE — Patient Instructions (Signed)
Description   Continue taking 5mg  daily except 2.5mg  (1/2 of 2.5mg  tablet) on Mondays and Fridays. Recheck in 2 weeks. Call us at [phone] with any questions, changes in medications, or upcoming procedures.

## 2021-03-22 ENCOUNTER — Other Ambulatory Visit: Payer: Self-pay | Admitting: Emergency Medicine

## 2021-03-22 DIAGNOSIS — Z1231 Encounter for screening mammogram for malignant neoplasm of breast: Secondary | ICD-10-CM

## 2021-03-23 ENCOUNTER — Other Ambulatory Visit: Payer: Medicare HMO

## 2021-03-23 ENCOUNTER — Ambulatory Visit: Payer: Medicare HMO

## 2021-03-25 ENCOUNTER — Other Ambulatory Visit: Payer: Self-pay | Admitting: Internal Medicine

## 2021-04-04 ENCOUNTER — Ambulatory Visit: Payer: Medicare Other

## 2021-04-04 ENCOUNTER — Other Ambulatory Visit: Payer: Self-pay

## 2021-04-04 DIAGNOSIS — Z7901 Long term (current) use of anticoagulants: Secondary | ICD-10-CM

## 2021-04-04 DIAGNOSIS — I824Y2 Acute embolism and thrombosis of unspecified deep veins of left proximal lower extremity: Secondary | ICD-10-CM | POA: Diagnosis not present

## 2021-04-04 DIAGNOSIS — I480 Paroxysmal atrial fibrillation: Secondary | ICD-10-CM | POA: Diagnosis not present

## 2021-04-04 LAB — POCT INR: INR: 5 — AB (ref 2.0–3.0)

## 2021-04-04 NOTE — Patient Instructions (Signed)
-   skip warfarin tonight, tomorrow and Wednesday, then - on Thursday, resume taking 5mg  daily except 2.5mg  (1/2 of 5 mg tablet) on Mondays and Fridays.  - Recheck INR in 11 days Call us at [phone] with any questions, changes in medications, or upcoming procedures.

## 2021-04-08 ENCOUNTER — Other Ambulatory Visit: Payer: Self-pay | Admitting: Internal Medicine

## 2021-04-08 NOTE — Telephone Encounter (Signed)
Prescription refill request received for warfarin Lov: 01/26/21 Angela Cummings)  Next INR check: 04/15/21 Warfarin tablet strength: 5mg   Appropriate dose and refill sent to requested pharmacy.

## 2021-04-11 ENCOUNTER — Ambulatory Visit: Payer: Medicare HMO | Admitting: Emergency Medicine

## 2021-04-15 ENCOUNTER — Other Ambulatory Visit: Payer: Self-pay

## 2021-04-15 ENCOUNTER — Ambulatory Visit: Payer: Medicare Other

## 2021-04-15 DIAGNOSIS — Z7901 Long term (current) use of anticoagulants: Secondary | ICD-10-CM

## 2021-04-15 DIAGNOSIS — I824Y2 Acute embolism and thrombosis of unspecified deep veins of left proximal lower extremity: Secondary | ICD-10-CM | POA: Diagnosis not present

## 2021-04-15 DIAGNOSIS — I480 Paroxysmal atrial fibrillation: Secondary | ICD-10-CM | POA: Diagnosis not present

## 2021-04-15 LAB — POCT INR: INR: 3.2 — AB (ref 2.0–3.0)

## 2021-04-15 NOTE — Patient Instructions (Signed)
-   skip warfarin tonight, then - resume taking 5mg  daily except 2.5mg  (1/2 of 5 mg tablet) on Mondays and Fridays.  - Recheck INR in 2 weeks Call us at [phone] with any questions, changes in medications, or upcoming procedures.

## 2021-04-16 DIAGNOSIS — Z91040 Latex allergy status: Secondary | ICD-10-CM | POA: Diagnosis not present

## 2021-04-16 DIAGNOSIS — Z9109 Other allergy status, other than to drugs and biological substances: Secondary | ICD-10-CM | POA: Diagnosis not present

## 2021-04-16 DIAGNOSIS — I4891 Unspecified atrial fibrillation: Secondary | ICD-10-CM | POA: Diagnosis not present

## 2021-04-16 DIAGNOSIS — Z79899 Other long term (current) drug therapy: Secondary | ICD-10-CM | POA: Diagnosis not present

## 2021-04-16 DIAGNOSIS — M79605 Pain in left leg: Secondary | ICD-10-CM | POA: Diagnosis not present

## 2021-04-16 DIAGNOSIS — I82432 Acute embolism and thrombosis of left popliteal vein: Secondary | ICD-10-CM | POA: Diagnosis not present

## 2021-04-17 ENCOUNTER — Other Ambulatory Visit (HOSPITAL_COMMUNITY): Payer: Self-pay | Admitting: Nurse Practitioner

## 2021-04-26 ENCOUNTER — Ambulatory Visit
Admission: RE | Admit: 2021-04-26 | Discharge: 2021-04-26 | Disposition: A | Discharge status: Home / Self Care | Payer: Medicare Other | Source: Ambulatory Visit | Attending: Emergency Medicine | Admitting: Emergency Medicine

## 2021-04-26 ENCOUNTER — Other Ambulatory Visit: Payer: Self-pay

## 2021-04-26 DIAGNOSIS — Z1231 Encounter for screening mammogram for malignant neoplasm of breast: Secondary | ICD-10-CM | POA: Diagnosis not present

## 2021-04-29 ENCOUNTER — Other Ambulatory Visit: Payer: Self-pay

## 2021-04-29 ENCOUNTER — Ambulatory Visit: Payer: Medicare Other

## 2021-04-29 DIAGNOSIS — I480 Paroxysmal atrial fibrillation: Secondary | ICD-10-CM | POA: Diagnosis not present

## 2021-04-29 DIAGNOSIS — Z7901 Long term (current) use of anticoagulants: Secondary | ICD-10-CM | POA: Diagnosis not present

## 2021-04-29 DIAGNOSIS — I824Y2 Acute embolism and thrombosis of unspecified deep veins of left proximal lower extremity: Secondary | ICD-10-CM | POA: Diagnosis not present

## 2021-04-29 LAB — POCT INR: INR: 2.1 (ref 2.0–3.0)

## 2021-04-29 NOTE — Patient Instructions (Signed)
-   continue taking 5mg  daily except 2.5mg  (1/2 of 5 mg tablet) on Mondays and Fridays.  - Recheck INR in 3 weeks Call us at [phone] with any questions, changes in medications, or upcoming procedures.

## 2021-05-12 ENCOUNTER — Encounter (HOSPITAL_COMMUNITY): Payer: Self-pay

## 2021-05-12 ENCOUNTER — Inpatient Hospital Stay (HOSPITAL_COMMUNITY): Admission: RE | Admit: 2021-05-12 | Payer: Medicare Other | Source: Ambulatory Visit | Admitting: Nurse Practitioner

## 2021-05-24 ENCOUNTER — Other Ambulatory Visit: Payer: Self-pay

## 2021-05-24 ENCOUNTER — Ambulatory Visit (HOSPITAL_COMMUNITY)
Admission: RE | Admit: 2021-05-24 | Discharge: 2021-05-24 | Disposition: A | Discharge status: Home / Self Care | Payer: Medicare Other | Source: Ambulatory Visit | Attending: Nurse Practitioner | Admitting: Nurse Practitioner

## 2021-05-24 ENCOUNTER — Ambulatory Visit: Payer: Medicare Other | Admitting: *Deleted

## 2021-05-24 ENCOUNTER — Encounter (HOSPITAL_COMMUNITY): Payer: Self-pay | Admitting: Nurse Practitioner

## 2021-05-24 VITALS — BP 138/68 | HR 82 | Ht 61.0 in | Wt 207.8 lb

## 2021-05-24 DIAGNOSIS — Z79899 Other long term (current) drug therapy: Secondary | ICD-10-CM | POA: Insufficient documentation

## 2021-05-24 DIAGNOSIS — I480 Paroxysmal atrial fibrillation: Secondary | ICD-10-CM

## 2021-05-24 DIAGNOSIS — Z86718 Personal history of other venous thrombosis and embolism: Secondary | ICD-10-CM | POA: Diagnosis not present

## 2021-05-24 DIAGNOSIS — D6869 Other thrombophilia: Secondary | ICD-10-CM | POA: Diagnosis not present

## 2021-05-24 DIAGNOSIS — I824Y2 Acute embolism and thrombosis of unspecified deep veins of left proximal lower extremity: Secondary | ICD-10-CM | POA: Diagnosis not present

## 2021-05-24 DIAGNOSIS — Z7901 Long term (current) use of anticoagulants: Secondary | ICD-10-CM | POA: Diagnosis not present

## 2021-05-24 DIAGNOSIS — I4819 Other persistent atrial fibrillation: Secondary | ICD-10-CM | POA: Diagnosis not present

## 2021-05-24 LAB — POCT INR: INR: 2.3 (ref 2.0–3.0)

## 2021-05-24 NOTE — Progress Notes (Signed)
Primary Care Physician: Horald Pollen, MD Referring Physician: Dr. [name] is a 65 y.o. female with a h/o persistent afib s/p ablation x2  that failed Tikosyn back in May, after being seen by Dr. [name] MAy 2020  and was started on amiodarone 200 mg bid to f/u in the afib clinic in  3 weeks. Pt failed to come to clinic and had to refuse her refills for her to agree to come in today. She has been taking 200 mg bid of amiodarone since May.  She last had a successful cardioversion early May.  Ekg today shows afib at 112 bpm, however, she feels that she is not in afib all the time as she has some days she reports HR's in the 60-70's range. She states that she feels well. She will lower amiodarone to one tab, 200 mg a day. She feels that she has not been taking her metoprolol as she felt her HR was in control. No missed xarelto for at least 3 weeks.  Pt is back in afib clinic, 11/18 after wearing a zio patch was found  to being out of rhythm  51% while wearing the monitor, However, she is in SR today. She is on amiodarone 200 mg daily for several weeks. Her Alk phos and TSH were elevated on last visit and will repeat  labs today .    F/u in afib clinic 06/11/19. She is in SR but has developed a long  first degree AV block and also has a known RBBB. Continues on 200 mg amiodarone daily as well as 12.5 mg metoprolol succinate bid.   Discussed with Dr. [name], She has not noted any afib. He suggested to stop BB and decrease amiodarone to 100 mg daily.CHA2DS2VASc score of 2.   F/u in afib clinic, 07/21/19. She has now been off amiodarone for 4-6 weeks as it was causing EKG changes as well as elevated TSH/liver enzymes. Now in the office, she is in Sinus brady with first degree AV block no longer noted and and IRBBB. She feels well.   F/u in afib clinic, 8/10. She  has noted more palpitations. She is in SR with PAC's. Will restart low dose  BB. Continues  on xarelto 20 mg daily for a  CHA2DS2VASc score of 2.   F/u in the afib clinic, 07/21/20. She  was in a rush this am and forgot to take am BB. She  is in a sinus tach at 120 bpm. She has felt dizzy at times. No blood work x one year. She remains on xarelto with a CHA2DS2VASc score of 2.   F/u in afib clinic, 01/26/21. EKG shows afib with controlled v rate. She is not aware of being in afib. She does  not want to change afib strategy. She had been offered a repeat ablation by Dr. [name] I the past and she deferred.  She is c/o of painful L calf and LLE. She had been compliant with eliquis but had 2 8 hour trips to [place] 2/2 to family members dying in the last few weeks. She has noted a change in her leg  x 2 weeks.   F/u in afib clinic, 05/24/21. She is rate controlled permanent afib. This does not seem to cause her symptoms. Last time I saw her, she had a swollen Left leg. An U/S showed DVT and she was sent to the ER and was admitted. She was started on coumadin. She was suppose to  f/u with hematology which she failed to do , was unaware to the F/u and vascular was to f/u if ongoing issues. She went back to the ER for Left leg pain and was told she had a new clot. Per report " Nonocclusive deep vein thrombosis within the left popliteal vein." She was referred back to coumadin clinic but not to hematology or vascular. Her INR that day was at 1.5, the day before she was 3.2 and was told to reduce her coumadin the night before.  Her INR today is 2.3. Her leg is not painful to her today,  but still stays swollen mor so than RLE. She does wear support hose at times, but not daily.   Today, she denies symptoms of palpitations, chest pain, shortness of breath, orthopnea, PND, lower extremity edema, dizziness, presyncope, syncope, or neurologic sequela. The patient is tolerating medications without difficulties and is otherwise without complaint today.   Past Medical History:  Diagnosis Date   Anxiety    Asthma    Atrial fibrillation ([place])  11/2010   paroxysmal   Bradycardia    Depression    DJD (degenerative joint disease)    Cervical spine   Fibromyalgia    s/p rheumatology consultation/[facility]   Mild sleep apnea    Obesity    Osteoarthritis    Shoulders, hips,knees   Past Surgical History:  Procedure Laterality Date   ABLATION  08/2012   PVI by Dr [name]   ABLATION  11/13/2013   PVI by Dr [name]   ATRIAL FIBRILLATION ABLATION N/A 09/03/2012   Procedure: ATRIAL FIBRILLATION ABLATION;  Surgeon: Thompson Grayer, MD;  Location[facility] CATH LAB;  Service: Cardiovascular;  Laterality: N/A;   ATRIAL FIBRILLATION ABLATION N/A 11/13/2013   Procedure: ATRIAL FIBRILLATION ABLATION;  Surgeon: Coralyn Mark, MD;  Location: [place] CATH LAB;  Service: Cardiovascular;  Laterality: N/A;   CARDIOVERSION  03/13/2012   Procedure: CARDIOVERSION;  Surgeon: [name], MD;  Location: [place];  Service: Cardiovascular;  Laterality: N/A;   CARDIOVERSION N/A 07/26/2012   Procedure: CARDIOVERSION;  Surgeon: Thompson Grayer, MD;  Location: [place];  Service: Cardiovascular;  Laterality: N/A;   CARDIOVERSION N/A 02/20/2014   Procedure: CARDIOVERSION;  Surgeon: Larey Dresser, MD;  Location: [place];  Service: Cardiovascular;  Laterality: N/A;   CESAREAN SECTION     CHOLECYSTECTOMY     HERNIA REPAIR     KNEE SURGERY     neck tumor resection     benign   TEE WITHOUT CARDIOVERSION  03/13/2012   Procedure: TRANSESOPHAGEAL ECHOCARDIOGRAM (TEE);  Surgeon: [name], MD;  Location: [place];  Service: Cardiovascular;  Laterality: N/A;  Rm 2029   TEE WITHOUT CARDIOVERSION N/A 09/02/2012   Procedure: TRANSESOPHAGEAL ECHOCARDIOGRAM (TEE);  Surgeon: Josue Hector, MD;  Location: [place];  Service: Cardiovascular;  Laterality: N/A;   TEE WITHOUT CARDIOVERSION N/A 11/13/2013   Procedure: TRANSESOPHAGEAL ECHOCARDIOGRAM (TEE);  Surgeon: [name], MD;  Location: [facility] ENDOSCOPY;  Service: Cardiovascular;  Laterality: N/A;    Current Outpatient  Medications  Medication Sig Dispense Refill   acetaminophen (TYLENOL) 500 MG tablet Take 1,000 mg by mouth every 6 (six) hours as needed for moderate pain or headache.     KLOR-CON M20 20 MEQ tablet TAKE 2 TABLETS (40 MEQ TOTAL) 2 (TWO) TIMES DAILY BY MOUTH. (Patient taking differently: Take 40 mEq by mouth 2 (two) times daily.) 120 tablet 8   meloxicam (MOBIC) 15 MG tablet Take 1 tablet (15 mg  total) by mouth daily. 30 tablet 5   metoprolol succinate (TOPROL XL) 25 MG 24 hr tablet Take 0.5 tablets (12.5 mg total) by mouth daily. 30 tablet 6   Multiple Vitamins-Minerals (CENTRUM WOMEN PO) Take 1 tablet by mouth daily.     pantoprazole (PROTONIX) 40 MG tablet TAKE 1 TABLET BY MOUTH TWICE A DAY (Patient taking differently: Take 40 mg by mouth 2 (two) times daily.) 180 tablet 0   PROVENTIL HFA 108 (90 Base) MCG/ACT inhaler INHALE 2 PUFFS INTO THE LUNGS EVERY 6 HOURS AS NEEDED (Patient taking differently: Inhale 2 puffs into the lungs every 6 (six) hours as needed for shortness of breath or wheezing.) 6.7 g 0   TIADYLT ER 240 MG 24 hr capsule TAKE 1 CAPSULE BY MOUTH EVERY DAY 90 capsule 1   warfarin (COUMADIN) 5 MG tablet TAKE 1 TABLET DAILY EXCEPT 1/2 TABLET ON MONDAY AND FRIDAY OR AS DIRECTED BY ANTICOAGULATION CLINIC. 105 tablet 1   No current facility-administered medications for this encounter.    Allergies  Allergen Reactions   Adhesive [Tape] Itching and Rash    Paper tape is better but best to use no adhesive if possible    Social History   Socioeconomic History   Marital status: Widowed    Spouse name: Not on file   Number of children: 3   Years of education: Not on file   Highest education level: Not on file  Occupational History   Not on file  Tobacco Use   Smoking status: Never   Smokeless tobacco: Never  Vaping Use   Vaping Use: Never used  Substance and Sexual Activity   Alcohol use: Yes    Comment: 2 yearly   Drug use: No   Sexual activity: Never  Other Topics  Concern   Not on file  Social History Narrative   Marital status: widowed since 01/2014 of melanoma; not dating.       Children:  3 children (17,30, 32) and 4 grandchildren and 1 gg. 1 adopted child.       Lives with son.  Lives in [place].        Employment:  Unemployed.       Tobacco: none      Alcohol: none      Drugs: none      Exercise: none   Social Determinants of Radio broadcast assistant Strain: Not on file  Food Insecurity: Not on file  Transportation Needs: Not on file  Physical Activity: Not on file  Stress: Not on file  Social Connections: Not on file  Intimate Partner Violence: Not on file    Family History  Problem Relation Age of Onset   Emphysema Mother    COPD Mother    Anxiety disorder Mother    Cancer Father        lung cancer; non-smoker   Heart disease Father 55       AMI   COPD Sister    Emphysema Brother    Cancer Brother    Cancer Daughter    Cancer Sister        lung cancer   Heart disease Sister        AMI   Diabetes Other    Thyroid disease Other     ROS- All systems are reviewed and negative except as per the HPI above  Physical Exam: Vitals:   05/24/21 0934  BP: 138/68  Pulse: 82  Weight: 94.3 kg  Height: $Remove'5\' 1"'UnZMibL$  (  1.549 m)    Wt Readings from Last 3 Encounters:  05/24/21 94.3 kg  02/15/21 94.9 kg  01/29/21 93.6 kg    Labs: Lab Results  Component Value Date   NA 139 01/27/2021   K 4.0 01/27/2021   CL 104 01/27/2021   CO2 25 01/27/2021   GLUCOSE 104 (H) 01/27/2021   BUN 12 01/27/2021   CREATININE 0.81 01/27/2021   CALCIUM 9.0 01/27/2021   MG 2.1 01/27/2021   Lab Results  Component Value Date   INR 2.1 04/29/2021   Lab Results  Component Value Date   CHOL 169 03/14/2015   HDL 71 03/14/2015   LDLCALC 79 03/14/2015   TRIG 94 03/14/2015     GEN- The patient is well appearing, alert and oriented x 3 today.   Head- normocephalic, atraumatic Eyes-  Sclera clear, conjunctiva pink Ears- hearing  intact Oropharynx- clear Neck- supple, no JVP Lymph- no cervical lymphadenopathy Lungs- Clear to ausculation bilaterally, normal work of breathing Heart - irregular rate and rhythm, no murmurs, rubs or gallops, PMI not laterally displaced GI- soft, NT, ND, + BS Extremities- no clubbing, cyanosis, 1+ edema of left LLE, mildly pink lower shin area, sore to calf area MS- no significant deformity or atrophy Skin- no rash or lesion Psych- euthymic mood, full affect Neuro- strength and sensation are intact  EKG- afib at 82 bpm, qrs int 114 ms, qtc 467 ms  Epic records reviewed   Assessment and Plan: 1. Afib  Rate controlled afib today She is asymptomatic  She does not want to change approach to restore SR  Previously  treated with Tikosyn and amiodarone, failed both   Continue metoprolol succinate 12.5 mg daily   Continue Cardizem 240 mg daily  She does not want  to go down repeat ablation path  2.CHA2DS2VASc score is 4 Took xarelto prior to DVT in August of this year  Although she states she was compliant with drug, it was considered a failure of xarelto and  she was changed  to warfarin  She was suppose to f/u with hematology at discharge in August and failed to do so, pt states unaware She will call and get an appointment with last ER visit showing new  popliteal clot  Since she is having ongoing issues with her Left leg, Dr. Trula Slade in his hospital  note said to call for f/u if ongoing issues, will send request for visit  She does have support hose but  does use on a regular basis   F/u  in 6 months  Butch Penny C. Vaishnavi Dalby, [facility] [address] [place], [place] [address] [phone]

## 2021-05-24 NOTE — Patient Instructions (Signed)
Description   Continue taking 5mg  daily except 2.5mg  (1/2 of 5 mg tablet) on Mondays and Fridays.  Recheck INR in 4 weeks. Call us at [phone] with any questions, changes in medications, or upcoming procedures.

## 2021-05-25 ENCOUNTER — Other Ambulatory Visit (HOSPITAL_COMMUNITY): Payer: Self-pay | Admitting: *Deleted

## 2021-05-25 DIAGNOSIS — I824Z9 Acute embolism and thrombosis of unspecified deep veins of unspecified distal lower extremity: Secondary | ICD-10-CM

## 2021-05-26 ENCOUNTER — Telehealth: Payer: Self-pay | Admitting: Physician Assistant

## 2021-05-26 NOTE — Telephone Encounter (Signed)
Scheduled appt per 12/14 referral. Pt is aware of appt date and time.  °

## 2021-06-08 ENCOUNTER — Telehealth: Payer: Self-pay | Admitting: Physician Assistant

## 2021-06-10 ENCOUNTER — Inpatient Hospital Stay: Payer: Medicare Other

## 2021-06-10 ENCOUNTER — Inpatient Hospital Stay: Payer: Medicare Other | Attending: Physician Assistant | Admitting: Physician Assistant

## 2021-06-10 ENCOUNTER — Other Ambulatory Visit: Payer: Self-pay

## 2021-06-14 ENCOUNTER — Telehealth: Payer: Self-pay | Admitting: Physician Assistant

## 2021-06-14 NOTE — Telephone Encounter (Signed)
R/s pt's missed new hem appt. Pt is aware of new appt date and time. She is also aware to arrive 15 mins prior to appt time.

## 2021-06-22 ENCOUNTER — Other Ambulatory Visit: Payer: Self-pay

## 2021-06-22 ENCOUNTER — Ambulatory Visit: Payer: Medicare Other | Admitting: *Deleted

## 2021-06-22 DIAGNOSIS — I824Y2 Acute embolism and thrombosis of unspecified deep veins of left proximal lower extremity: Secondary | ICD-10-CM

## 2021-06-22 DIAGNOSIS — Z5181 Encounter for therapeutic drug level monitoring: Secondary | ICD-10-CM | POA: Diagnosis not present

## 2021-06-22 DIAGNOSIS — I480 Paroxysmal atrial fibrillation: Secondary | ICD-10-CM | POA: Diagnosis not present

## 2021-06-22 LAB — POCT INR: INR: 1.3 — AB (ref 2.0–3.0)

## 2021-06-22 NOTE — Patient Instructions (Signed)
Description    Take 1.5 tablets of warfarin today and tomorrow, then continue to take warfarin 1 tablet daily except for 1/2 a tablet on Mondays and Fridays. Recheck INR in 1 week. Coumadin Clinic [phone].

## 2021-06-27 ENCOUNTER — Encounter: Payer: Medicare Other | Admitting: Surgery

## 2021-06-29 ENCOUNTER — Other Ambulatory Visit: Payer: Self-pay

## 2021-06-29 ENCOUNTER — Inpatient Hospital Stay: Payer: Medicare Other

## 2021-06-29 ENCOUNTER — Inpatient Hospital Stay: Payer: Medicare Other | Attending: Physician Assistant | Admitting: Physician Assistant

## 2021-06-29 ENCOUNTER — Ambulatory Visit: Payer: Medicare Other

## 2021-06-29 VITALS — BP 117/59 | HR 78 | Temp 97.0°F | Resp 18 | Wt 204.1 lb

## 2021-06-29 DIAGNOSIS — Z9049 Acquired absence of other specified parts of digestive tract: Secondary | ICD-10-CM | POA: Diagnosis not present

## 2021-06-29 DIAGNOSIS — J45909 Unspecified asthma, uncomplicated: Secondary | ICD-10-CM | POA: Diagnosis not present

## 2021-06-29 DIAGNOSIS — Z836 Family history of other diseases of the respiratory system: Secondary | ICD-10-CM | POA: Insufficient documentation

## 2021-06-29 DIAGNOSIS — M1990 Unspecified osteoarthritis, unspecified site: Secondary | ICD-10-CM | POA: Insufficient documentation

## 2021-06-29 DIAGNOSIS — Z8349 Family history of other endocrine, nutritional and metabolic diseases: Secondary | ICD-10-CM | POA: Insufficient documentation

## 2021-06-29 DIAGNOSIS — Z8249 Family history of ischemic heart disease and other diseases of the circulatory system: Secondary | ICD-10-CM | POA: Insufficient documentation

## 2021-06-29 DIAGNOSIS — Z888 Allergy status to other drugs, medicaments and biological substances status: Secondary | ICD-10-CM | POA: Diagnosis not present

## 2021-06-29 DIAGNOSIS — Z801 Family history of malignant neoplasm of trachea, bronchus and lung: Secondary | ICD-10-CM | POA: Diagnosis not present

## 2021-06-29 DIAGNOSIS — M797 Fibromyalgia: Secondary | ICD-10-CM | POA: Diagnosis not present

## 2021-06-29 DIAGNOSIS — I82492 Acute embolism and thrombosis of other specified deep vein of left lower extremity: Secondary | ICD-10-CM | POA: Insufficient documentation

## 2021-06-29 DIAGNOSIS — I824Y2 Acute embolism and thrombosis of unspecified deep veins of left proximal lower extremity: Secondary | ICD-10-CM

## 2021-06-29 DIAGNOSIS — I480 Paroxysmal atrial fibrillation: Secondary | ICD-10-CM

## 2021-06-29 DIAGNOSIS — F419 Anxiety disorder, unspecified: Secondary | ICD-10-CM | POA: Diagnosis not present

## 2021-06-29 DIAGNOSIS — Z818 Family history of other mental and behavioral disorders: Secondary | ICD-10-CM | POA: Insufficient documentation

## 2021-06-29 DIAGNOSIS — M7989 Other specified soft tissue disorders: Secondary | ICD-10-CM | POA: Insufficient documentation

## 2021-06-29 DIAGNOSIS — Z7901 Long term (current) use of anticoagulants: Secondary | ICD-10-CM | POA: Diagnosis not present

## 2021-06-29 DIAGNOSIS — Z5181 Encounter for therapeutic drug level monitoring: Secondary | ICD-10-CM | POA: Diagnosis not present

## 2021-06-29 DIAGNOSIS — F32A Depression, unspecified: Secondary | ICD-10-CM | POA: Diagnosis not present

## 2021-06-29 DIAGNOSIS — I4891 Unspecified atrial fibrillation: Secondary | ICD-10-CM | POA: Diagnosis not present

## 2021-06-29 DIAGNOSIS — Z79899 Other long term (current) drug therapy: Secondary | ICD-10-CM | POA: Diagnosis not present

## 2021-06-29 DIAGNOSIS — Z86718 Personal history of other venous thrombosis and embolism: Secondary | ICD-10-CM | POA: Insufficient documentation

## 2021-06-29 DIAGNOSIS — M79605 Pain in left leg: Secondary | ICD-10-CM | POA: Diagnosis not present

## 2021-06-29 DIAGNOSIS — Z833 Family history of diabetes mellitus: Secondary | ICD-10-CM | POA: Insufficient documentation

## 2021-06-29 LAB — ANTITHROMBIN III: AntiThromb III Func: 123 % — ABNORMAL HIGH (ref 75–120)

## 2021-06-29 LAB — CBC WITH DIFFERENTIAL (CANCER CENTER ONLY)
Abs Immature Granulocytes: 0 10*3/uL (ref 0.00–0.07)
Basophils Absolute: 0.1 10*3/uL (ref 0.0–0.1)
Basophils Relative: 1 %
Eosinophils Absolute: 0.1 10*3/uL (ref 0.0–0.5)
Eosinophils Relative: 3 %
HCT: 39.1 % (ref 36.0–46.0)
Hemoglobin: 12.2 g/dL (ref 12.0–15.0)
Immature Granulocytes: 0 %
Lymphocytes Relative: 36 %
Lymphs Abs: 1.9 10*3/uL (ref 0.7–4.0)
MCH: 25.7 pg — ABNORMAL LOW (ref 26.0–34.0)
MCHC: 31.2 g/dL (ref 30.0–36.0)
MCV: 82.3 fL (ref 80.0–100.0)
Monocytes Absolute: 0.4 10*3/uL (ref 0.1–1.0)
Monocytes Relative: 8 %
Neutro Abs: 2.8 10*3/uL (ref 1.7–7.7)
Neutrophils Relative %: 52 %
Platelet Count: 297 10*3/uL (ref 150–400)
RBC: 4.75 MIL/uL (ref 3.87–5.11)
RDW: 13.8 % (ref 11.5–15.5)
WBC Count: 5.4 10*3/uL (ref 4.0–10.5)
nRBC: 0 % (ref 0.0–0.2)

## 2021-06-29 LAB — CMP (CANCER CENTER ONLY)
ALT: 16 U/L (ref 0–44)
AST: 19 U/L (ref 15–41)
Albumin: 4.1 g/dL (ref 3.5–5.0)
Alkaline Phosphatase: 213 U/L — ABNORMAL HIGH (ref 38–126)
Anion gap: 6 (ref 5–15)
BUN: 14 mg/dL (ref 8–23)
CO2: 28 mmol/L (ref 22–32)
Calcium: 9.2 mg/dL (ref 8.9–10.3)
Chloride: 106 mmol/L (ref 98–111)
Creatinine: 0.81 mg/dL (ref 0.44–1.00)
GFR, Estimated: >60 mL/min (ref >60)
Glucose, Bld: 86 mg/dL (ref 70–99)
Potassium: 3.4 mmol/L — ABNORMAL LOW (ref 3.5–5.1)
Sodium: 140 mmol/L (ref 135–145)
Total Bilirubin: 0.5 mg/dL (ref 0.3–1.2)
Total Protein: 7.5 g/dL (ref 6.5–8.1)

## 2021-06-29 LAB — POCT INR: INR: 2.2 (ref 2.0–3.0)

## 2021-06-29 NOTE — Progress Notes (Signed)
[place] Telephone:([phone]) [phone]   Fax:([phone]) [place] NOTE  Patient Care Team: Horald Pollen, MD as PCP - General (Internal Medicine)  Hematological/Oncological History #Left lower extremity DVTs: -01/26/2021-01/29/2021: Admitted after developing acute DVT involving the left popliteal vein, left posterior tibial veins, left peroneal veins. Age indeterminate DVT involving the left femoral vein. Failed Xarelto that she was on long term for A fib. Switched to Coumadin -04/16/2021: Presented to ED for recurrent left leg pain and swelling while on coumadin. Doppler US revealed nonocclusive DVT within the left popliteal vein. Recommended to continue on coumadin.  06/29/2021: Established care at [facility]:  DVTs in left lower extremity.   HISTORY OF PRESENTING ILLNESS:  Louellen A Trent 66 y.o. female with medical history significant for osteoarthritis, atrial fibrillation, degenerative joint disease, anxiety, asthma, fibromyalgia and depression.  Patient is unaccompanied for this visit.  On exam today, Ms. Sawhney reports that her energy levels are stable from baseline. She is able to complete all her daily activities on her own. She has a good appetite and denies any recent weight changes. She denies nausea, vomiting or abdominal pain.  Patient reports that swelling in her left lower leg comes and goes but is generally better since August 2022 when she was originally diagnosed with a blood clot.  She denies easy bruising or signs of active bleeding.  Patient denies fevers, chills, night sweats, shortness of breath, chest pain or cough.  She has no other complaints.  Rest of the 10 point ROS is below.  MEDICAL HISTORY:  Past Medical History:  Diagnosis Date   Anxiety    Asthma    Atrial fibrillation ([place]) 11/11/2010   paroxysmal   Bradycardia    Depression    DJD (degenerative joint disease)     Cervical spine   DVT (deep venous thrombosis) (HCC)    Fibromyalgia    s/p rheumatology consultation/[facility]   Mild sleep apnea    Obesity    Osteoarthritis    Shoulders, hips,knees    SURGICAL HISTORY: Past Surgical History:  Procedure Laterality Date   ABLATION  08/2012   PVI by Dr [name]   ABLATION  11/13/2013   PVI by Dr [name]   ATRIAL FIBRILLATION ABLATION N/A 09/03/2012   Procedure: ATRIAL FIBRILLATION ABLATION;  Surgeon: Thompson Grayer, MD;  Location: [facility] CATH LAB;  Service: Cardiovascular;  Laterality: N/A;   ATRIAL FIBRILLATION ABLATION N/A 11/13/2013   Procedure: ATRIAL FIBRILLATION ABLATION;  Surgeon: Coralyn Mark, MD;  Location: [facility] CATH LAB;  Service: Cardiovascular;  Laterality: N/A;   CARDIOVERSION  03/13/2012   Procedure: CARDIOVERSION;  Surgeon: [name], MD;  Location: [place];  Service: Cardiovascular;  Laterality: N/A;   CARDIOVERSION N/A 07/26/2012   Procedure: CARDIOVERSION;  Surgeon: Thompson Grayer, MD;  Location: [place];  Service: Cardiovascular;  Laterality: N/A;   CARDIOVERSION N/A 02/20/2014   Procedure: CARDIOVERSION;  Surgeon: Larey Dresser, MD;  Location: [place];  Service: Cardiovascular;  Laterality: N/A;   CESAREAN SECTION     CHOLECYSTECTOMY     HERNIA REPAIR     KNEE SURGERY     neck tumor resection     benign   TEE WITHOUT CARDIOVERSION  03/13/2012   Procedure: TRANSESOPHAGEAL ECHOCARDIOGRAM (TEE);  Surgeon: [name], MD;  Location: [place];  Service: Cardiovascular;  Laterality: N/A;  Rm 2029   TEE WITHOUT CARDIOVERSION N/A 09/02/2012   Procedure: TRANSESOPHAGEAL ECHOCARDIOGRAM (TEE);  Surgeon:  Josue Hector, MD;  Location: [place];  Service: Cardiovascular;  Laterality: N/A;   TEE WITHOUT CARDIOVERSION N/A 11/13/2013   Procedure: TRANSESOPHAGEAL ECHOCARDIOGRAM (TEE);  Surgeon: [name], MD;  Location: [facility] ENDOSCOPY;  Service: Cardiovascular;  Laterality: N/A;    SOCIAL HISTORY: Social History    Socioeconomic History   Marital status: Widowed    Spouse name: Not on file   Number of children: 3   Years of education: Not on file   Highest education level: Not on file  Occupational History   Not on file  Tobacco Use   Smoking status: Never   Smokeless tobacco: Never  Vaping Use   Vaping Use: Never used  Substance and Sexual Activity   Alcohol use: Yes    Comment: 2 yearly   Drug use: No   Sexual activity: Never  Other Topics Concern   Not on file  Social History Narrative   Marital status: widowed since 01/2014 of melanoma; not dating.       Children:  3 children (17,30, 32) and 4 grandchildren and 1 gg. 1 adopted child.       Lives with son.  Lives in [place].        Employment:  Unemployed.       Tobacco: none      Alcohol: none      Drugs: none      Exercise: none   Social Determinants of Radio broadcast assistant Strain: Not on file  Food Insecurity: Not on file  Transportation Needs: Not on file  Physical Activity: Not on file  Stress: Not on file  Social Connections: Not on file  Intimate Partner Violence: Not on file    FAMILY HISTORY: Family History  Problem Relation Age of Onset   Emphysema Mother    COPD Mother    Anxiety disorder Mother    Cancer Father        lung cancer; non-smoker   Heart disease Father 41       AMI   COPD Sister    Emphysema Brother    Cancer Brother    Cancer Daughter    Cancer Sister        lung cancer   Heart disease Sister        AMI   Diabetes Other    Thyroid disease Other     ALLERGIES:  is allergic to latex and adhesive [tape].  MEDICATIONS:  Current Outpatient Medications  Medication Sig Dispense Refill   acetaminophen (TYLENOL) 500 MG tablet Take 1,000 mg by mouth every 6 (six) hours as needed for moderate pain or headache.     KLOR-CON M20 20 MEQ tablet TAKE 2 TABLETS (40 MEQ TOTAL) 2 (TWO) TIMES DAILY BY MOUTH. (Patient taking differently: Take 40 mEq by mouth 2 (two) times daily.) 120  tablet 8   meloxicam (MOBIC) 15 MG tablet Take 1 tablet (15 mg total) by mouth daily. 30 tablet 5   Multiple Vitamins-Minerals (CENTRUM WOMEN PO) Take 1 tablet by mouth daily.     pantoprazole (PROTONIX) 40 MG tablet TAKE 1 TABLET BY MOUTH TWICE A DAY (Patient taking differently: Take 40 mg by mouth 2 (two) times daily.) 180 tablet 0   PROVENTIL HFA 108 (90 Base) MCG/ACT inhaler INHALE 2 PUFFS INTO THE LUNGS EVERY 6 HOURS AS NEEDED (Patient taking differently: Inhale 2 puffs into the lungs every 6 (six) hours as needed for shortness of breath or wheezing.) 6.7 g 0   TIADYLT  ER 240 MG 24 hr capsule TAKE 1 CAPSULE BY MOUTH EVERY DAY 90 capsule 1   warfarin (COUMADIN) 5 MG tablet TAKE 1 TABLET DAILY EXCEPT 1/2 TABLET ON MONDAY AND FRIDAY OR AS DIRECTED BY ANTICOAGULATION CLINIC. 105 tablet 1   metoprolol succinate (TOPROL XL) 25 MG 24 hr tablet Take 0.5 tablets (12.5 mg total) by mouth daily. 30 tablet 6   No current facility-administered medications for this visit.    REVIEW OF SYSTEMS:   Constitutional: ( - ) fevers, ( - )  chills , ( - ) night sweats Eyes: ( - ) blurriness of vision, ( - ) double vision, ( - ) watery eyes Ears, nose, mouth, throat, and face: ( - ) mucositis, ( - ) sore throat Respiratory: ( - ) cough, ( - ) dyspnea, ( - ) wheezes Cardiovascular: ( - ) palpitation, ( - ) chest discomfort, ( + ) lower extremity swelling Gastrointestinal:  ( - ) nausea, ( - ) heartburn, ( - ) change in bowel habits Skin: ( - ) abnormal skin rashes Lymphatics: ( - ) new lymphadenopathy, ( - ) easy bruising Neurological: ( - ) numbness, ( - ) tingling, ( - ) new weaknesses Behavioral/Psych: ( - ) mood change, ( - ) new changes  All other systems were reviewed with the patient and are negative.  PHYSICAL EXAMINATION: ECOG PERFORMANCE STATUS: 1 - Symptomatic but completely ambulatory  Vitals:   06/29/21 0910  BP: (!) 117/59  Pulse: 78  Resp: 18  Temp: (!) 97 F (36.1 C)  SpO2: 100%    Filed Weights   06/29/21 0910  Weight: 204 lb 1.6 oz (92.6 kg)    GENERAL: well appearing female in NAD  SKIN: skin color, texture, turgor are normal, no rashes or significant lesions EYES: conjunctiva are pink and non-injected, sclera clear OROPHARYNX: no exudate, no erythema; lips, buccal mucosa, and tongue normal  NECK: supple, non-tender LYMPH:  no palpable lymphadenopathy in the cervical, axillary or supraclavicular lymph nodes.  LUNGS: clear to auscultation and percussion with normal breathing effort HEART: regular rate & rhythm and no murmurs. Varicose veins noted in left leg with some mild erythema. No tenderness to palpation of lower legs or pronounced edema.  ABDOMEN: soft, non-tender, non-distended, normal bowel sounds Musculoskeletal: no cyanosis of digits and no clubbing  PSYCH: alert & oriented x 3, fluent speech NEURO: no focal motor/sensory deficits  LABORATORY DATA:  I have reviewed the data as listed CBC Latest Ref Rng & Units 06/29/2021 01/29/2021 01/28/2021  WBC 4.0 - 10.5 K/uL 5.4 5.6 6.0  Hemoglobin 12.0 - 15.0 g/dL 12.2 11.3(L) 11.6(L)  Hematocrit 36.0 - 46.0 % 39.1 35.8(L) 37.2  Platelets 150 - 400 K/uL 297 244 272    CMP Latest Ref Rng & Units 06/29/2021 01/27/2021 01/26/2021  Glucose 70 - 99 mg/dL 86 104(H) 98  BUN 8 - 23 mg/dL 14 12 10   Creatinine 0.44 - 1.00 mg/dL 0.81 0.81 0.81  Sodium 135 - 145 mmol/L 140 139 138  Potassium 3.5 - 5.1 mmol/L 3.4(L) 4.0 4.1  Chloride 98 - 111 mmol/L 106 104 103  CO2 22 - 32 mmol/L 28 25 27   Calcium 8.9 - 10.3 mg/dL 9.2 9.0 9.4  Total Protein 6.5 - 8.1 g/dL 7.5 5.8(L) 7.0  Total Bilirubin 0.3 - 1.2 mg/dL 0.5 0.5 0.4  Alkaline Phos 38 - 126 U/L 213(H) 139(H) 171(H)  AST 15 - 41 U/L 19 18 21   ALT 0 - 44 U/L 16  [place] is a 66 y.o. female who presents for initial evaluation for history of DVTs in the left lower leg.  Patient was initially diagnosed in August 2022 while she was on  long-term Xarelto for atrial fibrillation.  Patient reports that prior to diagnosis in July 2022, she took two trips to [place] by car without taking any breaks. Patient was switched to Coumadin but then subsequently presented to the emergency room in November 2022 at [facility] for recurrent left leg edema and pain.  Repeat Doppler ultrasound revealed nonocclusive DVT in the left popliteal vein.  Patient continues on Coumadin without any toxicities.  There was no provoking factor that was identified for the DVT that was seen in November 2022.  We reviewed provoking factors that can cause thromboembolisms including smoking, prolonged immobility, estrogen based hormone therapy, recent surgery and various clotting disorders.   Patient will proceed with serologic work-up to check CBC, CMP, protein C and protein S activity, beta-2 glycoprotein antibodies, lupus anticoagulant, cardiolipin antibodies and Antithrombin III levels. Additionally, we will obtain a CT venogram to assess for may Thurner syndrome. We recommend to continue on Coumadin for the time being while she undergoes work-up.    #DVTs in the left lower leg: --First episode in August 2022 with DVT involving left popliteal vein, left posterior tibial veins, left peroneal veins. Age indeterminate DVT involving the left femoral vein. Switched from Xarelto to Coumadin. --Second episode in November 2022 with DVT involving left popliteal vein. Continued on Coumadin --Labs today to check CBC, CMP, protein C and protein S activity, beta-2 glycoprotein antibodies, lupus anticoagulant, cardiolipin antibodies and Antithrombin III levels. --Ordered CT venogram of abdomen/pelvis to evaluate for May Therner's syndrome --Continue on coumadin until workup is complete.  Orders Placed This Encounter  Procedures   CT VENOGRAM ABD/PEL    Standing Status:   Future    Standing Expiration Date:   06/29/2022    Order Specific Question:   Preferred imaging  location?    Answer:   [facility]   CBC with Differential ([place] Only)    Standing Status:   Future    Number of Occurrences:   1    Standing Expiration Date:   06/29/2022   CMP ([facility] only)    Standing Status:   Future    Number of Occurrences:   1    Standing Expiration Date:   06/29/2022   Antithrombin III   Protein C activity   Protein C, total   Protein S activity   Protein S, total   Lupus anticoagulant panel   Beta-2-glycoprotein i abs, IgG/M/A   Cardiolipin antibodies, IgG, IgM, IgA   dRVVT Mix   dRVVT Confirm    All questions were answered. The patient knows to call the clinic with any problems, questions or concerns.  I have spent a total of 60 minutes minutes of face-to-face and non-face-to-face time, preparing to see the patient, obtaining and/or reviewing separately obtained history, performing a medically appropriate examination, counseling and educating the patient, ordering tests,documenting clinical information in the electronic health record, and care coordination.   Dede Query, PA-C Department of Hematology/Oncology [place] at [facility] Phone: [phone]  Patient was seen with Dr. Lorenso Courier  I have read the above note and personally examined the patient. I agree with the assessment and plan as noted above.  Briefly Ms. Anari Evitt is a 66 year old female who presents  for evaluation of recurrent VTE's of the left lower extremity.  Given that this has repeatedly occurred in the left lower extremity I would recommend further evaluation for possible May Thurner syndrome with a CT venogram.  Additionally the patient developed a VTE while on Xarelto therapy and therefore Coumadin therapy would be appropriate.  We discussed that consideration could also be put into transitioning to Eliquis therapy but the patient wished to continue on Coumadin at this time.  We will do a focused hypercoagulation work-up in order  to assure there is no blood disorder which is causing her current findings.   Ledell Peoples, MD Department of Hematology/Oncology [place] at [facility] Phone: [phone] Pager: [phone] Email: [email or web address]

## 2021-06-29 NOTE — Patient Instructions (Signed)
Description   Continue to take warfarin 1 tablet daily except for 1/2 a tablet on Mondays and Fridays. Recheck INR in 2 weeks. Coumadin Clinic [phone].

## 2021-06-30 LAB — BETA-2-GLYCOPROTEIN I ABS, IGG/M/A
Beta-2 Glyco I IgG: <9 GPI IgG units (ref 0–20)
Beta-2-Glycoprotein I IgA: <9 GPI IgA units (ref 0–25)
Beta-2-Glycoprotein I IgM: <9 GPI IgM units (ref 0–32)

## 2021-07-01 LAB — CARDIOLIPIN ANTIBODIES, IGG, IGM, IGA
Anticardiolipin IgA: <9 APL U/mL (ref 0–11)
Anticardiolipin IgG: <9 GPL U/mL (ref 0–14)
Anticardiolipin IgM: 30 MPL U/mL — ABNORMAL HIGH (ref 0–12)

## 2021-07-01 LAB — DRVVT CONFIRM: dRVVT Confirm: 1.1 ratio (ref 0.8–1.2)

## 2021-07-01 LAB — DRVVT MIX: dRVVT Mix: 42 s — ABNORMAL HIGH (ref 0.0–40.4)

## 2021-07-01 LAB — LUPUS ANTICOAGULANT PANEL
DRVVT: 53.8 s — ABNORMAL HIGH (ref 0.0–47.0)
PTT Lupus Anticoagulant: 39.7 s (ref 0.0–51.9)

## 2021-07-01 LAB — PROTEIN S, TOTAL: Protein S Ag, Total: 69 % (ref 60–150)

## 2021-07-01 LAB — PROTEIN C, TOTAL: Protein C, Total: 37 % — ABNORMAL LOW (ref 60–150)

## 2021-07-01 LAB — PROTEIN C ACTIVITY: Protein C Activity: 32 % — ABNORMAL LOW (ref 73–180)

## 2021-07-01 LAB — PROTEIN S ACTIVITY: Protein S Activity: 37 % — ABNORMAL LOW (ref 63–140)

## 2021-07-04 ENCOUNTER — Encounter: Payer: Self-pay | Admitting: Surgery

## 2021-07-04 ENCOUNTER — Other Ambulatory Visit: Payer: Self-pay

## 2021-07-04 ENCOUNTER — Ambulatory Visit: Payer: Medicare Other | Admitting: Surgery

## 2021-07-04 VITALS — BP 138/72 | HR 81 | Temp 98.2°F | Resp 20 | Ht 61.0 in | Wt 205.0 lb

## 2021-07-04 DIAGNOSIS — I82532 Chronic embolism and thrombosis of left popliteal vein: Secondary | ICD-10-CM

## 2021-07-04 NOTE — Progress Notes (Signed)
Agree, Protein C and S findings likely due to coumadin. No clear evidence of hypercoagulable condition.

## 2021-07-04 NOTE — Progress Notes (Signed)
Vascular and Vein Specialist of [facility]  Patient name: Angela Cummings MRN: [id number] DOB: 01-04-1956 Sex: female   REASON FOR VISIT:    Follow up  [place] ILLNESS:    Angela Cummings is a 66 y.o. female who I initially saw as a consult in the hospital in August 2022 for left leg DVT.  She had initially presented to atrial fibrillation clinic complaining of 2 weeks of leg swelling.  She had recently been on long distance travel due to recent deaths in her family.  She had a duplex that showed a left leg DVT involving the popliteal, posterior tibial, and left peroneal vein.  She was admitted to the hospital for IV heparin and converted to Coumadin.  She had been on Xarelto without interruption prior to this.  While in the hospital, she had a ilio caval ultrasound that did not show any evidence of iliofemoral DVT.  Therefore she was discharged to home with compression and anticoagulation.  She does report having a second DVT in November.  Her ultrasound was performed at [facility].  I do not have access to this study.  She is also been seen hematology.  She has a CT venogram ordered to rule out May Thurner syndrome.  She does wear compression socks but still has trouble with edema left greater than right   PAST MEDICAL HISTORY:   Past Medical History:  Diagnosis Date   Anxiety    Asthma    Atrial fibrillation ([place]) 11/2010   paroxysmal   Bradycardia    Depression    DJD (degenerative joint disease)    Cervical spine   Fibromyalgia    s/p rheumatology consultation/[facility]   Mild sleep apnea    Obesity    Osteoarthritis    Shoulders, hips,knees     FAMILY HISTORY:   Family History  Problem Relation Age of Onset   Emphysema Mother    COPD Mother    Anxiety disorder Mother    Cancer Father        lung cancer; non-smoker   Heart disease Father 36       AMI   COPD Sister    Emphysema Brother    Cancer Brother    Cancer  Daughter    Cancer Sister        lung cancer   Heart disease Sister        AMI   Diabetes Other    Thyroid disease Other     SOCIAL HISTORY:   Social History   Tobacco Use   Smoking status: Never   Smokeless tobacco: Never  Substance Use Topics   Alcohol use: Yes    Comment: 2 yearly     ALLERGIES:   Allergies  Allergen Reactions   Latex Itching, Swelling and Dermatitis   Adhesive [Tape] Itching and Rash    Paper tape is better but best to use no adhesive if possible     CURRENT MEDICATIONS:   Current Outpatient Medications  Medication Sig Dispense Refill   acetaminophen (TYLENOL) 500 MG tablet Take 1,000 mg by mouth every 6 (six) hours as needed for moderate pain or headache.     KLOR-CON M20 20 MEQ tablet TAKE 2 TABLETS (40 MEQ TOTAL) 2 (TWO) TIMES DAILY BY MOUTH. (Patient taking differently: Take 40 mEq by mouth 2 (two) times daily.) 120 tablet 8   meloxicam (MOBIC) 15 MG tablet Take 1 tablet (15 mg total) by mouth daily. 30 tablet 5   metoprolol  succinate (TOPROL XL) 25 MG 24 hr tablet Take 0.5 tablets (12.5 mg total) by mouth daily. 30 tablet 6   Multiple Vitamins-Minerals (CENTRUM WOMEN PO) Take 1 tablet by mouth daily.     pantoprazole (PROTONIX) 40 MG tablet TAKE 1 TABLET BY MOUTH TWICE A DAY (Patient taking differently: Take 40 mg by mouth 2 (two) times daily.) 180 tablet 0   PROVENTIL HFA 108 (90 Base) MCG/ACT inhaler INHALE 2 PUFFS INTO THE LUNGS EVERY 6 HOURS AS NEEDED (Patient taking differently: Inhale 2 puffs into the lungs every 6 (six) hours as needed for shortness of breath or wheezing.) 6.7 g 0   TIADYLT ER 240 MG 24 hr capsule TAKE 1 CAPSULE BY MOUTH EVERY DAY 90 capsule 1   warfarin (COUMADIN) 5 MG tablet TAKE 1 TABLET DAILY EXCEPT 1/2 TABLET ON MONDAY AND FRIDAY OR AS DIRECTED BY ANTICOAGULATION CLINIC. 105 tablet 1   No current facility-administered medications for this visit.    REVIEW OF SYSTEMS:   [X]  denotes positive finding, [ ]  denotes  negative finding Cardiac  Comments:  Chest pain or chest pressure:    Shortness of breath upon exertion:    Short of breath when lying flat:    Irregular heart rhythm:        Vascular    Pain in calf, thigh, or hip brought on by ambulation:    Pain in feet at night that wakes you up from your sleep:     Blood clot in your veins:    Leg swelling:  x       Pulmonary    Oxygen at home:    Productive cough:     Wheezing:         Neurologic    Sudden weakness in arms or legs:     Sudden numbness in arms or legs:     Sudden onset of difficulty speaking or slurred speech:    Temporary loss of vision in one eye:     Problems with dizziness:         Gastrointestinal    Blood in stool:     Vomited blood:         Genitourinary    Burning when urinating:     Blood in urine:        Psychiatric    Major depression:         Hematologic    Bleeding problems:    Problems with blood clotting too easily:        Skin    Rashes or ulcers:        Constitutional    Fever or chills:      PHYSICAL EXAM:   There were no vitals filed for this visit.  GENERAL: The patient is a well-nourished female, in no acute distress. The vital signs are documented above. CARDIAC: There is a regular rate and rhythm.  VASCULAR: Bilateral 1-2+ pitting edema PULMONARY: Non-labored respirations MUSCULOSKELETAL: There are no major deformities or cyanosis. NEUROLOGIC: No focal weakness or paresthesias are detected. SKIN: There are no ulcers or rashes noted. PSYCHIATRIC: The patient has a normal affect.  STUDIES:   None  MEDICAL ISSUES:   Left leg DVT: The patient had her DVT while on Xarelto was switched to Coumadin.  Per her report she had a second DVT while on Coumadin, however these records are at [facility] and I do not have access to them.  She is followed by hematology.  She is scheduled for a CT  venogram to rule out May Thurner syndrome.  I told her I would reach out to her after her CT scan.   If it is positive for May Thurner, we will proceed with venogram and possible stenting.    Leia Alf, MD, FACS [facility] [phone] Pager [phone]

## 2021-07-07 ENCOUNTER — Ambulatory Visit (HOSPITAL_COMMUNITY)
Admission: RE | Admit: 2021-07-07 | Discharge: 2021-07-07 | Disposition: A | Discharge status: Home / Self Care | Payer: Medicare Other | Source: Ambulatory Visit | Attending: Physician Assistant | Admitting: Physician Assistant

## 2021-07-07 ENCOUNTER — Other Ambulatory Visit: Payer: Self-pay

## 2021-07-07 ENCOUNTER — Telehealth: Payer: Self-pay | Admitting: Physician Assistant

## 2021-07-07 DIAGNOSIS — I82492 Acute embolism and thrombosis of other specified deep vein of left lower extremity: Secondary | ICD-10-CM | POA: Diagnosis not present

## 2021-07-07 DIAGNOSIS — Z9049 Acquired absence of other specified parts of digestive tract: Secondary | ICD-10-CM | POA: Diagnosis not present

## 2021-07-07 DIAGNOSIS — K8689 Other specified diseases of pancreas: Secondary | ICD-10-CM | POA: Diagnosis not present

## 2021-07-07 MED ORDER — IOHEXOL 350 MG/ML SOLN
100.0000 mL | Freq: Once | INTRAVENOUS | Status: AC | PRN
Start: 2021-07-07 — End: 2021-07-07
  Administered 2021-07-07: 100 mL via INTRAVENOUS

## 2021-07-07 NOTE — Telephone Encounter (Signed)
I  have tried to reach Ms. Milstein several times to review the lab results from 06/29/2021. I have left a voicemail to return my call. I was able to reach her son, Mr. Jelani Vreeland, and requested patient to return my call. I briefly reviewed the labs results that don't indicate a clotting disorder. Patient is scheduled for a CT venogram today so I will try to reach back to review the imaging results. Recommend for patient to continue on Coumadin as currently prescribed.

## 2021-07-11 ENCOUNTER — Telehealth: Payer: Self-pay

## 2021-07-11 NOTE — Telephone Encounter (Signed)
-----   Message from Lincoln Brigham, [place] sent at 07/11/2021 11:23 AM EST ----- Please notify patient that CT scan did not show any vascular abnormalities that could cause the left leg DVTs. Additionally, blood work does not show any clotting disorders. Recommend to continue on coumadin. Please schedule labs and f/u in 6 months.

## 2021-07-11 NOTE — Telephone Encounter (Signed)
Pt advised and verbalized understanding.

## 2021-07-13 ENCOUNTER — Other Ambulatory Visit: Payer: Self-pay

## 2021-07-13 ENCOUNTER — Ambulatory Visit: Payer: Medicare Other | Admitting: *Deleted

## 2021-07-13 DIAGNOSIS — I480 Paroxysmal atrial fibrillation: Secondary | ICD-10-CM | POA: Diagnosis not present

## 2021-07-13 DIAGNOSIS — I824Y2 Acute embolism and thrombosis of unspecified deep veins of left proximal lower extremity: Secondary | ICD-10-CM | POA: Diagnosis not present

## 2021-07-13 DIAGNOSIS — Z7901 Long term (current) use of anticoagulants: Secondary | ICD-10-CM | POA: Diagnosis not present

## 2021-07-13 LAB — POCT INR: INR: 1.9 — AB (ref 2.0–3.0)

## 2021-07-13 NOTE — Patient Instructions (Signed)
Description   Today take 1.5 tablets then continue taking 1 tablet daily except for 1/2 tablet on Mondays and Fridays. Recheck INR in 3 weeks. Coumadin Clinic [phone].

## 2021-08-04 ENCOUNTER — Other Ambulatory Visit: Payer: Self-pay

## 2021-08-04 ENCOUNTER — Ambulatory Visit: Payer: Medicare Other | Admitting: *Deleted

## 2021-08-04 DIAGNOSIS — I480 Paroxysmal atrial fibrillation: Secondary | ICD-10-CM

## 2021-08-04 DIAGNOSIS — I824Y2 Acute embolism and thrombosis of unspecified deep veins of left proximal lower extremity: Secondary | ICD-10-CM | POA: Diagnosis not present

## 2021-08-04 DIAGNOSIS — Z7901 Long term (current) use of anticoagulants: Secondary | ICD-10-CM | POA: Diagnosis not present

## 2021-08-04 LAB — POCT INR: INR: 1.5 — AB (ref 2.0–3.0)

## 2021-08-04 NOTE — Patient Instructions (Addendum)
Description   Today take 1.5 tablets and tomorrow take 1 tablet then start taking 1 tablet daily except for 1/2 tablet on Fridays. Recheck INR in 1 week. Coumadin Clinic [phone].

## 2021-08-12 ENCOUNTER — Ambulatory Visit: Payer: Medicare Other | Admitting: *Deleted

## 2021-08-12 ENCOUNTER — Other Ambulatory Visit: Payer: Self-pay

## 2021-08-12 DIAGNOSIS — I824Y2 Acute embolism and thrombosis of unspecified deep veins of left proximal lower extremity: Secondary | ICD-10-CM

## 2021-08-12 DIAGNOSIS — I480 Paroxysmal atrial fibrillation: Secondary | ICD-10-CM

## 2021-08-12 DIAGNOSIS — Z7901 Long term (current) use of anticoagulants: Secondary | ICD-10-CM

## 2021-08-12 LAB — POCT INR: INR: 2.5 (ref 2.0–3.0)

## 2021-08-12 NOTE — Patient Instructions (Signed)
Description   ?Continue taking Warfarin 1 tablet daily except for 1/2 tablet on Fridays. Recheck INR in 2 weeks. Coumadin Clinic [phone].  ?  ?  ?

## 2021-08-23 ENCOUNTER — Telehealth: Payer: Self-pay | Admitting: Internal Medicine

## 2021-08-23 NOTE — Telephone Encounter (Signed)
Ashly from [place] called to speak with Candace about this patient.  ?

## 2021-08-23 NOTE — Telephone Encounter (Signed)
Returned a call to [employer] with [employer]/[facility] IN[facility] and he stated that the pt inquired about home testing. Advised that we are not participating in home self testing and that she can reach out to her PCP to see if that would be an option. If that is an option for them they will have to monitor INR and dose warfarin. Otherwise we will continue to monitor in office.Ashly verbalized understanding.  ?

## 2021-08-26 ENCOUNTER — Ambulatory Visit
Admission: RE | Admit: 2021-08-26 | Discharge: 2021-08-26 | Disposition: A | Discharge status: Home / Self Care | Payer: Medicare PPO | Source: Ambulatory Visit | Attending: Emergency Medicine | Admitting: Emergency Medicine

## 2021-08-26 ENCOUNTER — Other Ambulatory Visit: Payer: Self-pay

## 2021-08-26 ENCOUNTER — Ambulatory Visit (INDEPENDENT_AMBULATORY_CARE_PROVIDER_SITE_OTHER): Payer: Medicare PPO | Admitting: *Deleted

## 2021-08-26 DIAGNOSIS — I480 Paroxysmal atrial fibrillation: Secondary | ICD-10-CM

## 2021-08-26 DIAGNOSIS — Z7901 Long term (current) use of anticoagulants: Secondary | ICD-10-CM | POA: Diagnosis not present

## 2021-08-26 DIAGNOSIS — I824Y2 Acute embolism and thrombosis of unspecified deep veins of left proximal lower extremity: Secondary | ICD-10-CM | POA: Diagnosis not present

## 2021-08-26 LAB — POCT INR: INR: 1.9 — AB (ref 2.0–3.0)

## 2021-08-26 NOTE — Patient Instructions (Signed)
Description   ?Today take 1 tablet of warfarin then continue taking Warfarin 1 tablet daily except for 1/2 tablet on Fridays. Recheck INR in 3 weeks. Coumadin Clinic [phone].  ?  ?  ?

## 2021-09-16 ENCOUNTER — Ambulatory Visit (INDEPENDENT_AMBULATORY_CARE_PROVIDER_SITE_OTHER): Payer: Medicare PPO

## 2021-09-16 DIAGNOSIS — Z7901 Long term (current) use of anticoagulants: Secondary | ICD-10-CM | POA: Diagnosis not present

## 2021-09-16 DIAGNOSIS — I824Y2 Acute embolism and thrombosis of unspecified deep veins of left proximal lower extremity: Secondary | ICD-10-CM

## 2021-09-16 DIAGNOSIS — I480 Paroxysmal atrial fibrillation: Secondary | ICD-10-CM | POA: Diagnosis not present

## 2021-09-16 LAB — POCT INR: INR: 1.9 — AB (ref 2.0–3.0)

## 2021-09-16 NOTE — Patient Instructions (Signed)
Start taking Warfarin 1 tablet daily except for 1/2 tablet on Fridays. Recheck INR in 4 weeks. Coumadin Clinic [phone].  ?

## 2021-09-21 ENCOUNTER — Other Ambulatory Visit: Payer: Self-pay | Admitting: Internal Medicine

## 2021-09-22 ENCOUNTER — Other Ambulatory Visit (HOSPITAL_COMMUNITY): Payer: Self-pay

## 2021-09-22 ENCOUNTER — Other Ambulatory Visit (HOSPITAL_COMMUNITY): Payer: Self-pay | Admitting: Nurse Practitioner

## 2021-09-22 MED ORDER — POTASSIUM CHLORIDE CRYS ER 20 MEQ PO TBCR: 40.0000 meq | EXTENDED_RELEASE_TABLET | Freq: Two times a day (BID) | ORAL | 2 refills | Status: AC

## 2021-10-11 ENCOUNTER — Ambulatory Visit (INDEPENDENT_AMBULATORY_CARE_PROVIDER_SITE_OTHER): Payer: Medicare PPO | Admitting: *Deleted

## 2021-10-11 DIAGNOSIS — Z7901 Long term (current) use of anticoagulants: Secondary | ICD-10-CM | POA: Diagnosis not present

## 2021-10-11 DIAGNOSIS — I480 Paroxysmal atrial fibrillation: Secondary | ICD-10-CM

## 2021-10-11 DIAGNOSIS — I824Y2 Acute embolism and thrombosis of unspecified deep veins of left proximal lower extremity: Secondary | ICD-10-CM | POA: Diagnosis not present

## 2021-10-11 LAB — POCT INR: INR: 1.8 — AB (ref 2.0–3.0)

## 2021-10-11 NOTE — Patient Instructions (Signed)
Description   ?Today take 1.5 tablets then start taking Warfarin 1 tablet daily except 1.5 tablets on Sundays. Recheck INR in 4 weeks. Coumadin Clinic [phone].  ?  ?  ?

## 2021-10-20 ENCOUNTER — Other Ambulatory Visit: Payer: Self-pay

## 2021-10-20 DIAGNOSIS — I480 Paroxysmal atrial fibrillation: Secondary | ICD-10-CM

## 2021-10-20 MED ORDER — WARFARIN SODIUM 5 MG PO TABS: ORAL_TABLET | ORAL | 1 refills | Status: DC

## 2021-10-20 NOTE — Telephone Encounter (Signed)
[facility] mail order pharmacy is requesting refill be sent to them. Please address ?

## 2021-10-20 NOTE — Telephone Encounter (Signed)
Prescription refill request received for warfarin ?Lov: 05/24/21 Kayleen Memos) ?Next INR check: 11/09/21 ?Warfarin tablet strength: '5mg'$  ? ?Appropriate dose and refill sent to requested pharmacy.  ?

## 2021-10-21 ENCOUNTER — Other Ambulatory Visit (HOSPITAL_COMMUNITY): Payer: Self-pay

## 2021-10-21 MED ORDER — DILTIAZEM HCL ER BEADS 240 MG PO CP24: 240.0000 mg | ORAL_CAPSULE | Freq: Every day | ORAL | 0 refills | Status: DC

## 2021-11-09 ENCOUNTER — Ambulatory Visit (INDEPENDENT_AMBULATORY_CARE_PROVIDER_SITE_OTHER): Payer: Medicare PPO

## 2021-11-09 DIAGNOSIS — I824Y2 Acute embolism and thrombosis of unspecified deep veins of left proximal lower extremity: Secondary | ICD-10-CM

## 2021-11-09 DIAGNOSIS — I480 Paroxysmal atrial fibrillation: Secondary | ICD-10-CM | POA: Diagnosis not present

## 2021-11-09 DIAGNOSIS — Z7901 Long term (current) use of anticoagulants: Secondary | ICD-10-CM | POA: Diagnosis not present

## 2021-11-09 LAB — POCT INR: INR: 2.1 (ref 2.0–3.0)

## 2021-11-09 NOTE — Patient Instructions (Signed)
Description   Continue taking Warfarin 1 tablet daily except 1.5 tablets on Sundays. Recheck INR in 4 weeks.  Coumadin Clinic [phone].

## 2021-11-20 ENCOUNTER — Other Ambulatory Visit (HOSPITAL_COMMUNITY): Payer: Self-pay | Admitting: Nurse Practitioner

## 2021-11-30 ENCOUNTER — Ambulatory Visit (HOSPITAL_COMMUNITY)
Admission: RE | Admit: 2021-11-30 | Discharge: 2021-11-30 | Disposition: A | Discharge status: Home / Self Care | Payer: Medicare PPO | Source: Ambulatory Visit | Attending: Nurse Practitioner | Admitting: Nurse Practitioner

## 2021-11-30 VITALS — BP 140/76 | HR 82 | Ht 61.0 in | Wt 207.6 lb

## 2021-11-30 DIAGNOSIS — I480 Paroxysmal atrial fibrillation: Secondary | ICD-10-CM | POA: Diagnosis not present

## 2021-11-30 DIAGNOSIS — Z79899 Other long term (current) drug therapy: Secondary | ICD-10-CM | POA: Diagnosis not present

## 2021-11-30 DIAGNOSIS — Z86718 Personal history of other venous thrombosis and embolism: Secondary | ICD-10-CM | POA: Insufficient documentation

## 2021-11-30 DIAGNOSIS — I4819 Other persistent atrial fibrillation: Secondary | ICD-10-CM | POA: Insufficient documentation

## 2021-11-30 DIAGNOSIS — Z7901 Long term (current) use of anticoagulants: Secondary | ICD-10-CM | POA: Diagnosis not present

## 2021-11-30 DIAGNOSIS — I4510 Unspecified right bundle-branch block: Secondary | ICD-10-CM | POA: Diagnosis not present

## 2021-11-30 DIAGNOSIS — D6869 Other thrombophilia: Secondary | ICD-10-CM | POA: Diagnosis not present

## 2021-11-30 NOTE — Progress Notes (Signed)
Primary Care Physician: Horald Pollen, MD Referring Physician: Dr. [name] is a 66 y.o. female with a h/o persistent afib s/p ablation x2  that failed Tikosyn back in May, after being seen by Dr. [name] MAy 2020  and was started on amiodarone 200 mg bid to f/u in the afib clinic in  3 weeks. Pt failed to come to clinic and had to refuse her refills for her to agree to come in today. She has been taking 200 mg bid of amiodarone since May.  She last had a successful cardioversion early May.  Ekg today shows afib at 112 bpm, however, she feels that she is not in afib all the time as she has some days she reports HR's in the 60-70's range. She states that she feels well. She will lower amiodarone to one tab, 200 mg a day. She feels that she has not been taking her metoprolol as she felt her HR was in control. No missed xarelto for at least 3 weeks.  Pt is back in afib clinic, 11/18 after wearing a zio patch was found  to being out of rhythm  51% while wearing the monitor, However, she is in SR today. She is on amiodarone 200 mg daily for several weeks. Her Alk phos and TSH were elevated on last visit and will repeat  labs today .    F/u in afib clinic 06/11/19. She is in SR but has developed a long  first degree AV block and also has a known RBBB. Continues on 200 mg amiodarone daily as well as 12.5 mg metoprolol succinate bid.   Discussed with Dr. [name], She has not noted any afib. He suggested to stop BB and decrease amiodarone to 100 mg daily.CHA2DS2VASc score of 2.   F/u in afib clinic, 07/21/19. She has now been off amiodarone for 4-6 weeks as it was causing EKG changes as well as elevated TSH/liver enzymes. Now in the office, she is in Sinus brady with first degree AV block no longer noted and and IRBBB. She feels well.   F/u in afib clinic, 8/10. She  has noted more palpitations. She is in SR with PAC's. Will restart low dose  BB. Continues  on xarelto 20 mg daily for a  CHA2DS2VASc score of 2.   F/u in the afib clinic, 07/21/20. She  was in a rush this am and forgot to take am BB. She  is in a sinus tach at 120 bpm. She has felt dizzy at times. No blood work x one year. She remains on xarelto with a CHA2DS2VASc score of 2.   F/u in afib clinic, 01/26/21. EKG shows afib with controlled v rate. She is not aware of being in afib. She does  not want to change afib strategy. She had been offered a repeat ablation by Dr. [name] I the past and she deferred.  She is c/o of painful L calf and LLE. She had been compliant with eliquis but had 2 8 hour trips to [place] 2/2 to family members dying in the last few weeks. She has noted a change in her leg  x 2 weeks.   F/u in afib clinic, 05/24/21. She is rate controlled permanent afib. This does not seem to cause her symptoms. Last time I saw her, she had a swollen Left leg. An U/S showed DVT and she was sent to the ER and was admitted. She was started on coumadin. She was suppose to  f/u with hematology which she failed to do , was unaware to the F/u and vascular was to f/u if ongoing issues. She went back to the ER for Left leg pain and was told she had a new clot. Per report " Nonocclusive deep vein thrombosis within the left popliteal vein." She was referred back to coumadin clinic but not to hematology or vascular. Her INR that day was at 1.5, the day before she was 3.2 and was told to reduce her coumadin the night before.  Her INR today is 2.3. Her leg is not painful to her today,  but still stays swollen mor so than RLE. She does wear support hose at times, but not daily.   F/u in afib clinic, 11/30/21. She remains in rate controlled afib, pt states not symptomatic with it. She remains on coumadin for the left  leg DVT. She has been evaluated by hematology and vascular. She states that her INR levels ate hard to regulate. Follows with [address] coumadin clinic.   Today, she denies symptoms of palpitations, chest pain, shortness  of breath, orthopnea, PND, lower extremity edema, dizziness, presyncope, syncope, or neurologic sequela. The patient is tolerating medications without difficulties and is otherwise without complaint today.   Past Medical History:  Diagnosis Date   Anxiety    Asthma    Atrial fibrillation ([place]) 11/11/2010   paroxysmal   Bradycardia    Depression    DJD (degenerative joint disease)    Cervical spine   DVT (deep venous thrombosis) (HCC)    Fibromyalgia    s/p rheumatology consultation/[facility]   Mild sleep apnea    Obesity    Osteoarthritis    Shoulders, hips,knees   Past Surgical History:  Procedure Laterality Date   ABLATION  08/2012   PVI by Dr [name]   ABLATION  11/13/2013   PVI by Dr [name]   ATRIAL FIBRILLATION ABLATION N/A 09/03/2012   Procedure: ATRIAL FIBRILLATION ABLATION;  Surgeon: Thompson Grayer, MD;  Location[facility] CATH LAB;  Service: Cardiovascular;  Laterality: N/A;   ATRIAL FIBRILLATION ABLATION N/A 11/13/2013   Procedure: ATRIAL FIBRILLATION ABLATION;  Surgeon: Coralyn [place], MD;  Location: [place] CATH LAB;  Service: Cardiovascular;  Laterality: N/A;   CARDIOVERSION  03/13/2012   Procedure: CARDIOVERSION;  Surgeon: [name], MD;  Location: [place];  Service: Cardiovascular;  Laterality: N/A;   CARDIOVERSION N/A 07/26/2012   Procedure: CARDIOVERSION;  Surgeon: Thompson Grayer, MD;  Location: [place];  Service: Cardiovascular;  Laterality: N/A;   CARDIOVERSION N/A 02/20/2014   Procedure: CARDIOVERSION;  Surgeon: Larey Dresser, MD;  Location: [place];  Service: Cardiovascular;  Laterality: N/A;   CESAREAN SECTION     CHOLECYSTECTOMY     HERNIA REPAIR     KNEE SURGERY     neck tumor resection     benign   TEE WITHOUT CARDIOVERSION  03/13/2012   Procedure: TRANSESOPHAGEAL ECHOCARDIOGRAM (TEE);  Surgeon: [name], MD;  Location: [place];  Service: Cardiovascular;  Laterality: N/A;  Rm 2029   TEE WITHOUT CARDIOVERSION N/A 09/02/2012   Procedure:  TRANSESOPHAGEAL ECHOCARDIOGRAM (TEE);  Surgeon: Josue Hector, MD;  Location: [place];  Service: Cardiovascular;  Laterality: N/A;   TEE WITHOUT CARDIOVERSION N/A 11/13/2013   Procedure: TRANSESOPHAGEAL ECHOCARDIOGRAM (TEE);  Surgeon: [name], MD;  Location: [facility] ENDOSCOPY;  Service: Cardiovascular;  Laterality: N/A;    Current Outpatient Medications  Medication Sig Dispense Refill   acetaminophen (TYLENOL) 500 MG tablet Take 1,000 mg  by mouth every 6 (six) hours as needed for moderate pain or headache.     meloxicam (MOBIC) 15 MG tablet Take 1 tablet (15 mg total) by mouth daily. 30 tablet 5   metoprolol succinate (TOPROL XL) 25 MG 24 hr tablet Take 0.5 tablets (12.5 mg total) by mouth daily. 30 tablet 6   Multiple Vitamins-Minerals (CENTRUM WOMEN PO) Take 1 tablet by mouth daily.     pantoprazole (PROTONIX) 40 MG tablet TAKE 1 TABLET BY MOUTH TWICE A DAY (Patient taking differently: Take 40 mg by mouth 2 (two) times daily.) 180 tablet 0   potassium chloride SA (KLOR-CON M20) 20 MEQ tablet Take 2 tablets (40 mEq total) by mouth 2 (two) times daily. 360 tablet 2   PROVENTIL HFA 108 (90 Base) MCG/ACT inhaler INHALE 2 PUFFS INTO THE LUNGS EVERY 6 HOURS AS NEEDED (Patient taking differently: Inhale 2 puffs into the lungs every 6 (six) hours as needed for shortness of breath or wheezing.) 6.7 g 0   TIADYLT ER 240 MG 24 hr capsule TAKE 1 CAPSULE BY MOUTH EVERY DAY 90 capsule 1   warfarin (COUMADIN) 5 MG tablet Take 1 tablet daily or as directed by Anticoagulation Clinic. 100 tablet 1   No current facility-administered medications for this encounter.    Allergies  Allergen Reactions   Latex Itching, Swelling and Dermatitis   Adhesive [Tape] Itching and Rash    Paper tape is better but best to use no adhesive if possible    Social History   Socioeconomic History   Marital status: Widowed    Spouse name: Not on file   Number of children: 3   Years of education: Not on file    Highest education level: Not on file  Occupational History   Not on file  Tobacco Use   Smoking status: Never   Smokeless tobacco: Never  Vaping Use   Vaping Use: Never used  Substance and Sexual Activity   Alcohol use: Yes    Comment: 2 yearly   Drug use: No   Sexual activity: Never  Other Topics Concern   Not on file  Social History Narrative   Marital status: widowed since 01/2014 of melanoma; not dating.       Children:  3 children (17,30, 32) and 4 grandchildren and 1 gg. 1 adopted child.       Lives with son.  Lives in [place].        Employment:  Unemployed.       Tobacco: none      Alcohol: none      Drugs: none      Exercise: none   Social Determinants of Health   Financial Resource Strain: Not on file  Food Insecurity: Not on file  Transportation Needs: Not on file  Physical Activity: Not on file  Stress: Not on file  Social Connections: Not on file  Intimate Partner Violence: Not on file    Family History  Problem Relation Age of Onset   Emphysema Mother    COPD Mother    Anxiety disorder Mother    Cancer Father        lung cancer; non-smoker   Heart disease Father 23       AMI   COPD Sister    Emphysema Brother    Cancer Brother    Cancer Daughter    Cancer Sister        lung cancer   Heart disease Sister  AMI   Diabetes Other    Thyroid disease Other     ROS- All systems are reviewed and negative except as per the HPI above  Physical Exam: Vitals:   11/30/21 0945  BP: 140/76  Pulse: 82  Weight: 94.2 kg  Height: $Remove'5\' 1"'vjtQCMy$  (1.549 m)    Wt Readings from Last 3 Encounters:  11/30/21 94.2 kg  07/04/21 93 kg  06/29/21 92.6 kg    Labs: Lab Results  Component Value Date   NA 140 06/29/2021   K 3.4 (L) 06/29/2021   CL 106 06/29/2021   CO2 28 06/29/2021   GLUCOSE 86 06/29/2021   BUN 14 06/29/2021   CREATININE 0.81 06/29/2021   CALCIUM 9.2 06/29/2021   MG 2.1 01/27/2021   Lab Results  Component Value Date   INR 2.1  11/09/2021   Lab Results  Component Value Date   CHOL 169 03/14/2015   HDL 71 03/14/2015   LDLCALC 79 03/14/2015   TRIG 94 03/14/2015     GEN- The patient is well appearing, alert and oriented x 3 today.   Head- normocephalic, atraumatic Eyes-  Sclera clear, conjunctiva pink Ears- hearing intact Oropharynx- clear Neck- supple, no JVP Lymph- no cervical lymphadenopathy Lungs- Clear to ausculation bilaterally, normal work of breathing Heart - irregular rate and rhythm, no murmurs, rubs or gallops, PMI not laterally displaced GI- soft, NT, ND, + BS Extremities- no clubbing, cyanosis, 1+ edema of left LLE, mildly pink lower shin area, sore to calf area MS- no significant deformity or atrophy Skin- no rash or lesion Psych- euthymic mood, full affect Neuro- strength and sensation are intact  EKG- afib at 82 bpm, qrs int 116 ms, qtc 472 ms  Epic records reviewed   Assessment and Plan: 1. Afib  Rate controlled afib today She is asymptomatic  She does not want to change approach to restore SR  Previously  treated with Tikosyn and amiodarone, failed both   Continue metoprolol succinate 12.5 mg daily   She does not want  to go down repeat ablation path  2.CHA2DS2VASc score is 4 Took xarelto prior to DVT in August of 2022 Although she states she was compliant with drug, it was considered a failure of xarelto and  she was changed  to warfarin  She has had f/u with hematology and vascular  Currently she is not symptomatic with prior DVT, swelling of leg is much improved  F/u  in 6 months  Butch Penny C. Camara Rosander, [facility] [address] [place], [place] [address] [phone]

## 2021-12-06 ENCOUNTER — Ambulatory Visit (INDEPENDENT_AMBULATORY_CARE_PROVIDER_SITE_OTHER): Payer: Medicare PPO

## 2021-12-06 DIAGNOSIS — I824Y2 Acute embolism and thrombosis of unspecified deep veins of left proximal lower extremity: Secondary | ICD-10-CM

## 2021-12-06 DIAGNOSIS — I480 Paroxysmal atrial fibrillation: Secondary | ICD-10-CM

## 2021-12-06 DIAGNOSIS — Z7901 Long term (current) use of anticoagulants: Secondary | ICD-10-CM | POA: Diagnosis not present

## 2021-12-06 LAB — POCT INR: INR: 2.5 (ref 2.0–3.0)

## 2022-01-04 ENCOUNTER — Inpatient Hospital Stay: Payer: Medicare PPO

## 2022-01-04 ENCOUNTER — Inpatient Hospital Stay: Payer: Medicare PPO | Attending: Physician Assistant | Admitting: Physician Assistant

## 2022-01-04 ENCOUNTER — Other Ambulatory Visit: Payer: Self-pay | Admitting: Physician Assistant

## 2022-01-04 DIAGNOSIS — Z86718 Personal history of other venous thrombosis and embolism: Secondary | ICD-10-CM

## 2022-01-17 ENCOUNTER — Ambulatory Visit (INDEPENDENT_AMBULATORY_CARE_PROVIDER_SITE_OTHER): Payer: Medicare PPO

## 2022-01-17 DIAGNOSIS — I480 Paroxysmal atrial fibrillation: Secondary | ICD-10-CM

## 2022-01-17 DIAGNOSIS — I824Y2 Acute embolism and thrombosis of unspecified deep veins of left proximal lower extremity: Secondary | ICD-10-CM | POA: Diagnosis not present

## 2022-01-17 DIAGNOSIS — Z7901 Long term (current) use of anticoagulants: Secondary | ICD-10-CM | POA: Diagnosis not present

## 2022-01-17 LAB — POCT INR: INR: 2 (ref 2.0–3.0)

## 2022-01-17 NOTE — Patient Instructions (Signed)
Continue taking Warfarin 1 tablet daily except 1.5 tablets on Sundays. Recheck INR in 6 weeks.  Coumadin Clinic [phone].

## 2022-02-28 ENCOUNTER — Ambulatory Visit: Payer: Medicare PPO | Attending: Cardiology

## 2022-02-28 DIAGNOSIS — I480 Paroxysmal atrial fibrillation: Secondary | ICD-10-CM

## 2022-02-28 DIAGNOSIS — I824Y2 Acute embolism and thrombosis of unspecified deep veins of left proximal lower extremity: Secondary | ICD-10-CM | POA: Diagnosis not present

## 2022-02-28 DIAGNOSIS — Z7901 Long term (current) use of anticoagulants: Secondary | ICD-10-CM

## 2022-02-28 LAB — POCT INR: INR: 3.3 — AB (ref 2.0–3.0)

## 2022-02-28 NOTE — Patient Instructions (Signed)
Continue taking Warfarin 1 tablet daily except 1.5 tablets on Sundays. Recheck INR in 6 weeks. Eat greens tonight Coumadin Clinic [phone].

## 2022-04-11 ENCOUNTER — Ambulatory Visit: Payer: Medicare PPO | Attending: Internal Medicine | Admitting: *Deleted

## 2022-04-11 DIAGNOSIS — Z5181 Encounter for therapeutic drug level monitoring: Secondary | ICD-10-CM

## 2022-04-11 DIAGNOSIS — I824Y2 Acute embolism and thrombosis of unspecified deep veins of left proximal lower extremity: Secondary | ICD-10-CM

## 2022-04-11 DIAGNOSIS — I480 Paroxysmal atrial fibrillation: Secondary | ICD-10-CM

## 2022-04-11 LAB — POCT INR: INR: 3.4 — AB (ref 2.0–3.0)

## 2022-04-11 NOTE — Patient Instructions (Signed)
Description   Hold warfarin today and then START taking warfarin 1 tablet daily.  Recheck INR in 3 weeks. Coumadin Clinic [phone].

## 2022-05-01 ENCOUNTER — Ambulatory Visit: Payer: Medicare PPO | Attending: Cardiology | Admitting: *Deleted

## 2022-05-01 DIAGNOSIS — Z7901 Long term (current) use of anticoagulants: Secondary | ICD-10-CM | POA: Diagnosis not present

## 2022-05-01 DIAGNOSIS — I824Y2 Acute embolism and thrombosis of unspecified deep veins of left proximal lower extremity: Secondary | ICD-10-CM | POA: Diagnosis not present

## 2022-05-01 DIAGNOSIS — I480 Paroxysmal atrial fibrillation: Secondary | ICD-10-CM | POA: Diagnosis not present

## 2022-05-01 LAB — POCT INR: INR: 2.6 (ref 2.0–3.0)

## 2022-05-30 ENCOUNTER — Ambulatory Visit: Payer: Medicare PPO

## 2022-05-31 ENCOUNTER — Ambulatory Visit: Payer: Medicare PPO

## 2022-06-08 ENCOUNTER — Ambulatory Visit (HOSPITAL_COMMUNITY)
Admission: RE | Admit: 2022-06-08 | Discharge: 2022-06-08 | Disposition: A | Discharge status: Home / Self Care | Payer: Medicare PPO | Source: Ambulatory Visit | Attending: Nurse Practitioner | Admitting: Nurse Practitioner

## 2022-06-08 ENCOUNTER — Encounter (HOSPITAL_COMMUNITY): Payer: Self-pay | Admitting: Nurse Practitioner

## 2022-06-08 VITALS — BP 160/90 | HR 78 | Ht 61.0 in | Wt 204.4 lb

## 2022-06-08 DIAGNOSIS — D6869 Other thrombophilia: Secondary | ICD-10-CM | POA: Diagnosis not present

## 2022-06-08 DIAGNOSIS — I480 Paroxysmal atrial fibrillation: Secondary | ICD-10-CM | POA: Diagnosis not present

## 2022-06-08 DIAGNOSIS — Z86718 Personal history of other venous thrombosis and embolism: Secondary | ICD-10-CM | POA: Insufficient documentation

## 2022-06-08 DIAGNOSIS — Z79899 Other long term (current) drug therapy: Secondary | ICD-10-CM | POA: Insufficient documentation

## 2022-06-08 DIAGNOSIS — I4821 Permanent atrial fibrillation: Secondary | ICD-10-CM

## 2022-06-08 DIAGNOSIS — Z7901 Long term (current) use of anticoagulants: Secondary | ICD-10-CM | POA: Insufficient documentation

## 2022-06-08 NOTE — Progress Notes (Signed)
Primary Care Physician: Horald Pollen, MD Referring Physician: Dr. [name] is a 66 y.o. female with a h/o persistent afib s/p ablation x2  that failed Tikosyn back in May, after being seen by Dr. [name] MAy 2020  and was started on amiodarone 200 mg bid to f/u in the afib clinic in  3 weeks. Pt failed to come to clinic and had to refuse her refills for her to agree to come in today. She has been taking 200 mg bid of amiodarone since May.  She last had a successful cardioversion early May.  Ekg today shows afib at 112 bpm, however, she feels that she is not in afib all the time as she has some days she reports HR's in the 60-70's range. She states that she feels well. She will lower amiodarone to one tab, 200 mg a day. She feels that she has not been taking her metoprolol as she felt her HR was in control. No missed xarelto for at least 3 weeks.  Pt is back in afib clinic, 11/18 after wearing a zio patch was found  to being out of rhythm  51% while wearing the monitor, However, she is in SR today. She is on amiodarone 200 mg daily for several weeks. Her Alk phos and TSH were elevated on last visit and will repeat  labs today .    F/u in afib clinic 06/11/19. She is in SR but has developed a long  first degree AV block and also has a known RBBB. Continues on 200 mg amiodarone daily as well as 12.5 mg metoprolol succinate bid.   Discussed with Dr. [name], She has not noted any afib. He suggested to stop BB and decrease amiodarone to 100 mg daily.CHA2DS2VASc score of 2.   F/u in afib clinic, 07/21/19. She has now been off amiodarone for 4-6 weeks as it was causing EKG changes as well as elevated TSH/liver enzymes. Now in the office, she is in Sinus brady with first degree AV block no longer noted and and IRBBB. She feels well.   F/u in afib clinic, 8/10. She  has noted more palpitations. She is in SR with PAC's. Will restart low dose  BB. Continues  on xarelto 20 mg daily for a  CHA2DS2VASc score of 2.   F/u in the afib clinic, 07/21/20. She  was in a rush this am and forgot to take am BB. She  is in a sinus tach at 120 bpm. She has felt dizzy at times. No blood work x one year. She remains on xarelto with a CHA2DS2VASc score of 2.   F/u in afib clinic, 01/26/21. EKG shows afib with controlled v rate. She is not aware of being in afib. She does  not want to change afib strategy. She had been offered a repeat ablation by Dr. [name] I the past and she deferred.  She is c/o of painful L calf and LLE. She had been compliant with eliquis but had 2 8 hour trips to [place] 2/2 to family members dying in the last few weeks. She has noted a change in her leg  x 2 weeks.   F/u in afib clinic, 05/24/21. She is rate controlled permanent afib. This does not seem to cause her symptoms. Last time I saw her, she had a swollen Left leg. An U/S showed DVT and she was sent to the ER and was admitted. She was started on coumadin. She was suppose to  f/u with hematology which she failed to do , was unaware to the F/u and vascular was to f/u if ongoing issues. She went back to the ER for Left leg pain and was told she had a new clot. Per report " Nonocclusive deep vein thrombosis within the left popliteal vein." She was referred back to coumadin clinic but not to hematology or vascular. Her INR that day was at 1.5, the day before she was 3.2 and was told to reduce her coumadin the night before.  Her INR today is 2.3. Her leg is not painful to her today,  but still stays swollen mor so than RLE. She does wear support hose at times, but not daily.   F/u in afib clinic, 11/30/21. She remains in rate controlled afib, pt states not symptomatic with it. She remains on coumadin for the left  leg DVT. She has been evaluated by hematology and vascular. She states that her INR levels ate hard to regulate. Follows with [address] coumadin clinic.  F/u in afib clinic, 06/08/22. She remains in rate controlled afib.  BP higher on presentation, but just took in a 93 month old  great grandchild to raise. Now between her daughter and herself , they are raising 6 of her granddaughters children, ages 63yo-4 months. Thankfully, her granddaughter has just  had her tubes tied. Remains on warfarin for DVT on xarelto.    Today, she denies symptoms of palpitations, chest pain, shortness of breath, orthopnea, PND, lower extremity edema, dizziness, presyncope, syncope, or neurologic sequela. The patient is tolerating medications without difficulties and is otherwise without complaint today.   Past Medical History:  Diagnosis Date   Anxiety    Asthma    Atrial fibrillation ([place]) 11/11/2010   paroxysmal   Bradycardia    Depression    DJD (degenerative joint disease)    Cervical spine   DVT (deep venous thrombosis) (HCC)    Fibromyalgia    s/p rheumatology consultation/Z[facility]insky   Mild sleep apnea    Obesity    Osteoarthritis    Shoulders, hips,knees   Past Surgical History:  Procedure Laterality Date   ABLATION  08/2012   PVI by Dr [name]   ABLATION  11/13/2013   PVI by Dr [name]   ATRIAL FIBRILLATION ABLATION N/A 09/03/2012   Procedure: ATRIAL FIBRILLATION ABLATION;  Surgeon: Thompson Grayer, MD;  Location: [facility] CATH LAB;  Service: Cardiovascular;  Laterality: N/A;   ATRIAL FIBRILLATION ABLATION N/A 11/13/2013   Procedure: ATRIAL FIBRILLATION ABLATION;  Surgeon: Coralyn [place], MD;  Location: [place] CATH LAB;  Service: Cardiovascular;  Laterality: N/A;   CARDIOVERSION  03/13/2012   Procedure: CARDIOVERSION;  Surgeon: [name], MD;  Location: [place];  Service: Cardiovascular;  Laterality: N/A;   CARDIOVERSION N/A 07/26/2012   Procedure: CARDIOVERSION;  Surgeon: Thompson Grayer, MD;  Location: [place];  Service: Cardiovascular;  Laterality: N/A;   CARDIOVERSION N/A 02/20/2014   Procedure: CARDIOVERSION;  Surgeon: Larey Dresser, MD;  Location: [place];  Service: Cardiovascular;  Laterality: N/A;   CESAREAN  SECTION     CHOLECYSTECTOMY     HERNIA REPAIR     KNEE SURGERY     neck tumor resection     benign   TEE WITHOUT CARDIOVERSION  03/13/2012   Procedure: TRANSESOPHAGEAL ECHOCARDIOGRAM (TEE);  Surgeon: [name], MD;  Location: [place];  Service: Cardiovascular;  Laterality: N/A;  Rm 2029   TEE WITHOUT CARDIOVERSION N/A 09/02/2012   Procedure: TRANSESOPHAGEAL ECHOCARDIOGRAM (TEE);  Surgeon: Josue Hector, MD;  Location: [place];  Service: Cardiovascular;  Laterality: N/A;   TEE WITHOUT CARDIOVERSION N/A 11/13/2013   Procedure: TRANSESOPHAGEAL ECHOCARDIOGRAM (TEE);  Surgeon: [name], MD;  Location: [facility] ENDOSCOPY;  Service: Cardiovascular;  Laterality: N/A;    Current Outpatient Medications  Medication Sig Dispense Refill   acetaminophen (TYLENOL) 500 MG tablet Take 1,000 mg by mouth every 6 (six) hours as needed for moderate pain or headache.     meloxicam (MOBIC) 15 MG tablet Take 1 tablet (15 mg total) by mouth daily. 30 tablet 5   metoprolol succinate (TOPROL XL) 25 MG 24 hr tablet Take 0.5 tablets (12.5 mg total) by mouth daily. 30 tablet 6   Multiple Vitamins-Minerals (CENTRUM WOMEN PO) Take 1 tablet by mouth daily.     pantoprazole (PROTONIX) 40 MG tablet TAKE 1 TABLET BY MOUTH TWICE A DAY (Patient taking differently: Take 40 mg by mouth 2 (two) times daily.) 180 tablet 0   potassium chloride SA (KLOR-CON M20) 20 MEQ tablet Take 2 tablets (40 mEq total) by mouth 2 (two) times daily. 360 tablet 2   PROVENTIL HFA 108 (90 Base) MCG/ACT inhaler INHALE 2 PUFFS INTO THE LUNGS EVERY 6 HOURS AS NEEDED (Patient taking differently: Inhale 2 puffs into the lungs every 6 (six) hours as needed for shortness of breath or wheezing.) 6.7 g 0   TIADYLT ER 240 MG 24 hr capsule TAKE 1 CAPSULE BY MOUTH EVERY DAY 90 capsule 1   warfarin (COUMADIN) 5 MG tablet Take 1 tablet daily or as directed by Anticoagulation Clinic. 100 tablet 1   No current facility-administered medications for this  encounter.    Allergies  Allergen Reactions   Latex Itching, Swelling and Dermatitis   Adhesive [Tape] Itching and Rash    Paper tape is better but best to use no adhesive if possible    Social History   Socioeconomic History   Marital status: Widowed    Spouse name: Not on file   Number of children: 3   Years of education: Not on file   Highest education level: Not on file  Occupational History   Not on file  Tobacco Use   Smoking status: Never   Smokeless tobacco: Never  Vaping Use   Vaping Use: Never used  Substance and Sexual Activity   Alcohol use: Yes    Comment: 2 yearly   Drug use: No   Sexual activity: Never  Other Topics Concern   Not on file  Social History Narrative   Marital status: widowed since 01/2014 of melanoma; not dating.       Children:  3 children (17,30, 32) and 4 grandchildren and 1 gg. 1 adopted child.       Lives with son.  Lives in [place].        Employment:  Unemployed.       Tobacco: none      Alcohol: none      Drugs: none      Exercise: none   Social Determinants of Health   Financial Resource Strain: Not on file  Food Insecurity: Not on file  Transportation Needs: Not on file  Physical Activity: Not on file  Stress: Not on file  Social Connections: Not on file  Intimate Partner Violence: Not on file    Family History  Problem Relation Age of Onset   Emphysema Mother    COPD Mother    Anxiety disorder Mother    Cancer Father  lung cancer; non-smoker   Heart disease Father 83       AMI   COPD Sister    Emphysema Brother    Cancer Brother    Cancer Daughter    Cancer Sister        lung cancer   Heart disease Sister        AMI   Diabetes Other    Thyroid disease Other     ROS- All systems are reviewed and negative except as per the HPI above  Physical Exam: Vitals:   06/08/22 0916  BP: (!) 160/90  Pulse: 78  Weight: 92.7 kg  Height: _0  (1.549 m)    Wt Readings from Last 3 Encounters:   06/08/22 92.7 kg  11/30/21 94.2 kg  07/04/21 93 kg    Labs: Lab Results  Component Value Date   NA 140 06/29/2021   K 3.4 (L) 06/29/2021   CL 106 06/29/2021   CO2 28 06/29/2021   GLUCOSE 86 06/29/2021   BUN 14 06/29/2021   CREATININE 0.81 06/29/2021   CALCIUM 9.2 06/29/2021   MG 2.1 01/27/2021   Lab Results  Component Value Date   INR 2.6 05/01/2022   Lab Results  Component Value Date   CHOL 169 03/14/2015   HDL 71 03/14/2015   LDLCALC 79 03/14/2015   TRIG 94 03/14/2015     GEN- The patient is well appearing, alert and oriented x 3 today.   Head- normocephalic, atraumatic Eyes-  Sclera clear, conjunctiva pink Ears- hearing intact Oropharynx- clear Neck- supple, no JVP Lymph- no cervical lymphadenopathy Lungs- Clear to ausculation bilaterally, normal work of breathing Heart - irregular rate and rhythm, no murmurs, rubs or gallops, PMI not laterally displaced GI- soft, NT, ND, + BS Extremities- no clubbing, cyanosis, 1+ edema of left LLE, mildly pink lower shin area, sore to calf area MS- no significant deformity or atrophy Skin- no rash or lesion Psych- euthymic mood, full affect Neuro- strength and sensation are intact  EKG-  Vent. rate 78 BPM PR interval * ms QRS duration 114 ms QT/QTcB 408/465 ms P-R-T axes * 61 11 Atrial fibrillation Low voltage QRS Right bundle branch block Abnormal ECG When compared with ECG of 30-Nov-2021 10:01, PREVIOUS ECG IS PRESENT  Epic records reviewed   Assessment and Plan: 1. Afib  Rate controlled afib today She is asymptomatic  She does not want to change approach to restore SR  Previously  treated with Tikosyn and amiodarone, failed both   Continue metoprolol succinate 12.5 mg daily   She does not want  to go down repeat ablation path  2.CHA2DS2VASc score is 4 Took xarelto prior to DVT in August of 2022 Although she states she was compliant with drug, it was considered a failure of xarelto and  she was changed   to warfarin  She has had f/u with hematology and vascular  Currently she is not symptomatic with prior DVT, swelling of leg is much improved Follows with [place] coumadin clinic  F/u  in 6 months  Butch Penny C. Kellen Dutch, [facility] [address] [place],  [address] [phone]

## 2022-06-13 ENCOUNTER — Ambulatory Visit: Payer: Medicare PPO | Attending: Internal Medicine

## 2022-06-13 DIAGNOSIS — Z7901 Long term (current) use of anticoagulants: Secondary | ICD-10-CM

## 2022-06-13 DIAGNOSIS — I480 Paroxysmal atrial fibrillation: Secondary | ICD-10-CM | POA: Diagnosis not present

## 2022-06-13 DIAGNOSIS — Z5181 Encounter for therapeutic drug level monitoring: Secondary | ICD-10-CM | POA: Diagnosis not present

## 2022-06-13 DIAGNOSIS — I824Y2 Acute embolism and thrombosis of unspecified deep veins of left proximal lower extremity: Secondary | ICD-10-CM

## 2022-06-13 LAB — POCT INR: INR: 2.5 (ref 2.0–3.0)

## 2022-06-13 NOTE — Patient Instructions (Signed)
Continue taking warfarin 1 tablet daily.  Recheck INR in 6 weeks. Coumadin Clinic [phone] or [phone]

## 2022-07-20 ENCOUNTER — Encounter (HOSPITAL_COMMUNITY): Payer: Self-pay | Admitting: *Deleted

## 2022-07-25 ENCOUNTER — Ambulatory Visit: Payer: Medicare PPO

## 2022-08-01 ENCOUNTER — Ambulatory Visit: Payer: Medicare PPO

## 2022-08-08 ENCOUNTER — Ambulatory Visit: Payer: Medicare PPO | Attending: Internal Medicine | Admitting: *Deleted

## 2022-08-08 DIAGNOSIS — I824Y2 Acute embolism and thrombosis of unspecified deep veins of left proximal lower extremity: Secondary | ICD-10-CM | POA: Diagnosis not present

## 2022-08-08 DIAGNOSIS — I480 Paroxysmal atrial fibrillation: Secondary | ICD-10-CM | POA: Diagnosis not present

## 2022-08-08 DIAGNOSIS — Z5181 Encounter for therapeutic drug level monitoring: Secondary | ICD-10-CM | POA: Diagnosis not present

## 2022-08-08 LAB — POCT INR: POC INR: 2.7

## 2022-08-08 NOTE — Patient Instructions (Signed)
Description   Continue taking warfarin 1 tablet daily.  Recheck INR in 6 weeks. [facility] [phone] or [phone]

## 2022-08-27 ENCOUNTER — Other Ambulatory Visit (HOSPITAL_COMMUNITY): Payer: Self-pay | Admitting: Nurse Practitioner

## 2022-09-06 DIAGNOSIS — S86912A Strain of unspecified muscle(s) and tendon(s) at lower leg level, left leg, initial encounter: Secondary | ICD-10-CM | POA: Diagnosis not present

## 2022-09-06 DIAGNOSIS — M25562 Pain in left knee: Secondary | ICD-10-CM | POA: Diagnosis not present

## 2022-09-06 DIAGNOSIS — S0990XA Unspecified injury of head, initial encounter: Secondary | ICD-10-CM | POA: Diagnosis not present

## 2022-09-06 DIAGNOSIS — M1990 Unspecified osteoarthritis, unspecified site: Secondary | ICD-10-CM | POA: Diagnosis not present

## 2022-09-06 DIAGNOSIS — M25511 Pain in right shoulder: Secondary | ICD-10-CM | POA: Diagnosis not present

## 2022-09-06 DIAGNOSIS — Z043 Encounter for examination and observation following other accident: Secondary | ICD-10-CM | POA: Diagnosis not present

## 2022-09-06 DIAGNOSIS — Z85828 Personal history of other malignant neoplasm of skin: Secondary | ICD-10-CM | POA: Diagnosis not present

## 2022-09-06 DIAGNOSIS — S46911A Strain of unspecified muscle, fascia and tendon at shoulder and upper arm level, right arm, initial encounter: Secondary | ICD-10-CM | POA: Diagnosis not present

## 2022-09-06 DIAGNOSIS — I4891 Unspecified atrial fibrillation: Secondary | ICD-10-CM | POA: Diagnosis not present

## 2022-09-06 DIAGNOSIS — S8392XA Sprain of unspecified site of left knee, initial encounter: Secondary | ICD-10-CM | POA: Diagnosis not present

## 2022-09-06 DIAGNOSIS — J45909 Unspecified asthma, uncomplicated: Secondary | ICD-10-CM | POA: Diagnosis not present

## 2022-09-06 DIAGNOSIS — Z91040 Latex allergy status: Secondary | ICD-10-CM | POA: Diagnosis not present

## 2022-09-06 DIAGNOSIS — Z9109 Other allergy status, other than to drugs and biological substances: Secondary | ICD-10-CM | POA: Diagnosis not present

## 2022-09-06 DIAGNOSIS — S8992XA Unspecified injury of left lower leg, initial encounter: Secondary | ICD-10-CM | POA: Diagnosis not present

## 2022-09-06 DIAGNOSIS — S199XXA Unspecified injury of neck, initial encounter: Secondary | ICD-10-CM | POA: Diagnosis not present

## 2022-09-06 DIAGNOSIS — Z7901 Long term (current) use of anticoagulants: Secondary | ICD-10-CM | POA: Diagnosis not present

## 2022-09-07 ENCOUNTER — Telehealth: Payer: Self-pay

## 2022-09-07 NOTE — Transitions of Care (Post Inpatient/ED Visit) (Signed)
   09/07/2022  Name: Angela Cummings MRN: [id number] DOB: 1955-11-02  Today's TOC FU Call Status: Today's TOC FU Call Status:: Unsuccessul Call (1st Attempt) Unsuccessful Call (1st Attempt) Date: 09/07/22  Attempted to reach the patient regarding the most recent Inpatient/ED visit.  Follow Up Plan: Additional outreach attempts will be made to reach the patient to complete the Transitions of Care (Post Inpatient/ED visit) call.   Signature Juanda Crumble, [place] Direct Dial [phone]

## 2022-09-11 NOTE — Transitions of Care (Post Inpatient/ED Visit) (Unsigned)
   09/11/2022  Name: Angela Cummings MRN: [id number] DOB: 09/01/1955  Today's TOC FU Call Status: Today's TOC FU Call Status:: Unsuccessful Call (2nd Attempt) Unsuccessful Call (1st Attempt) Date: 09/07/22 Unsuccessful Call (2nd Attempt) Date: 09/11/22  Attempted to reach the patient regarding the most recent Inpatient/ED visit.  Follow Up Plan: Additional outreach attempts will be made to reach the patient to complete the Transitions of Care (Post Inpatient/ED visit) call.   Signature Juanda Crumble, [place] Direct Dial [phone]

## 2022-09-12 ENCOUNTER — Ambulatory Visit: Payer: Medicare PPO

## 2022-09-12 NOTE — Transitions of Care (Post Inpatient/ED Visit) (Signed)
   09/12/2022  Name: Angela Cummings MRN: [id number] DOB: 05-29-1956  Today's TOC FU Call Status: Today's TOC FU Call Status:: Unsuccessful Call (3rd Attempt) Unsuccessful Call (1st Attempt) Date: 09/07/22 Unsuccessful Call (2nd Attempt) Date: 09/11/22 Unsuccessful Call (3rd Attempt) Date: 09/12/22  Attempted to reach the patient regarding the most recent Inpatient/ED visit.  Follow Up Plan: No further outreach attempts will be made at this time. We have been unable to contact the patient.  Signature Juanda Crumble, [place] Direct Dial [phone]

## 2022-10-05 ENCOUNTER — Telehealth: Payer: Self-pay | Admitting: Cardiology

## 2022-10-05 DIAGNOSIS — I480 Paroxysmal atrial fibrillation: Secondary | ICD-10-CM

## 2022-10-05 MED ORDER — WARFARIN SODIUM 5 MG PO TABS: ORAL_TABLET | ORAL | 0 refills | Status: DC

## 2022-10-05 NOTE — Telephone Encounter (Addendum)
Warfarin  refill  Afib, DVT Last INR 08/08/22 Last OV 06/08/22  Spoke with pt and advised she needs appt, set an appt and she confirmed. Also, pt states she has been out of town with family issues so that's the reason she missed her appt. She verbalizes the importance of adhering to appts and taking meds. She is aware a week supply of warfarin will be sent until appt and she will call back if the health condition of her loved on changes. Also, she will call back if needed and that a full refill will be addressed at next visit.

## 2022-10-05 NOTE — Telephone Encounter (Signed)
*  STAT* If patient is at the pharmacy, call can be transferred to refill team.   1. Which medications need to be refilled? (please list name of each medication and dose if known)   warfarin (COUMADIN) 5 MG tablet    2. Which pharmacy/location (including street and city if local pharmacy) is medication to be sent to?  [id number] - [place], [place] - [address]    3. Do they need a 30 day or 90 day supply? 90

## 2022-10-11 ENCOUNTER — Other Ambulatory Visit: Payer: Self-pay | Admitting: *Deleted

## 2022-10-11 ENCOUNTER — Ambulatory Visit: Payer: Medicare PPO | Attending: Cardiovascular Disease | Admitting: *Deleted

## 2022-10-11 DIAGNOSIS — I480 Paroxysmal atrial fibrillation: Secondary | ICD-10-CM

## 2022-10-11 DIAGNOSIS — I824Y2 Acute embolism and thrombosis of unspecified deep veins of left proximal lower extremity: Secondary | ICD-10-CM | POA: Diagnosis not present

## 2022-10-11 DIAGNOSIS — Z7901 Long term (current) use of anticoagulants: Secondary | ICD-10-CM

## 2022-10-11 LAB — POCT INR: INR: 1.6 — AB (ref 2.0–3.0)

## 2022-10-11 MED ORDER — WARFARIN SODIUM 5 MG PO TABS: ORAL_TABLET | ORAL | 0 refills | Status: DC

## 2022-10-11 NOTE — Patient Instructions (Addendum)
Description   Today take 1.5 tablets of warfarin then continue taking warfarin 1 tablet daily. Recheck INR in 4 weeks. [facility] [phone] or [phone].      

## 2022-10-11 NOTE — Telephone Encounter (Signed)
Warfarin 5mg  refill  Afib, DVT Last INR 10/11/22 Last OV 06/08/22

## 2022-11-08 ENCOUNTER — Ambulatory Visit: Payer: Medicare PPO | Attending: Cardiology

## 2022-11-08 DIAGNOSIS — I824Y2 Acute embolism and thrombosis of unspecified deep veins of left proximal lower extremity: Secondary | ICD-10-CM | POA: Diagnosis not present

## 2022-11-08 DIAGNOSIS — I480 Paroxysmal atrial fibrillation: Secondary | ICD-10-CM

## 2022-11-08 LAB — POCT INR: INR: 2.2 (ref 2.0–3.0)

## 2022-11-08 NOTE — Patient Instructions (Signed)
Description   Continue taking warfarin 1 tablet daily.   Recheck INR in 4 weeks.  [facility] [phone] or [phone]

## 2022-12-06 ENCOUNTER — Ambulatory Visit: Payer: Medicare PPO | Attending: Cardiology | Admitting: *Deleted

## 2022-12-06 DIAGNOSIS — I824Y2 Acute embolism and thrombosis of unspecified deep veins of left proximal lower extremity: Secondary | ICD-10-CM

## 2022-12-06 DIAGNOSIS — I480 Paroxysmal atrial fibrillation: Secondary | ICD-10-CM

## 2022-12-06 DIAGNOSIS — Z7901 Long term (current) use of anticoagulants: Secondary | ICD-10-CM | POA: Diagnosis not present

## 2022-12-06 LAB — POCT INR: INR: 2.8 (ref 2.0–3.0)

## 2022-12-06 NOTE — Patient Instructions (Signed)
Description   Continue taking warfarin 1 tablet daily. Recheck INR in 5 weeks. [facility] [phone] or [phone]      

## 2023-01-10 ENCOUNTER — Ambulatory Visit: Payer: Medicare PPO | Admitting: *Deleted

## 2023-01-10 DIAGNOSIS — I824Y2 Acute embolism and thrombosis of unspecified deep veins of left proximal lower extremity: Secondary | ICD-10-CM

## 2023-01-10 DIAGNOSIS — Z7901 Long term (current) use of anticoagulants: Secondary | ICD-10-CM | POA: Diagnosis not present

## 2023-01-10 DIAGNOSIS — I480 Paroxysmal atrial fibrillation: Secondary | ICD-10-CM

## 2023-01-10 LAB — POCT INR: INR: 2.2 (ref 2.0–3.0)

## 2023-01-10 NOTE — Patient Instructions (Signed)
Description   Continue taking warfarin 1 tablet daily.  Recheck INR in 6 weeks. [facility] [phone] or [phone]

## 2023-01-15 ENCOUNTER — Other Ambulatory Visit: Payer: Self-pay | Admitting: Cardiology

## 2023-01-15 ENCOUNTER — Other Ambulatory Visit (HOSPITAL_COMMUNITY): Payer: Self-pay | Admitting: Physician Assistant

## 2023-01-15 DIAGNOSIS — I480 Paroxysmal atrial fibrillation: Secondary | ICD-10-CM

## 2023-02-21 ENCOUNTER — Ambulatory Visit: Payer: Medicare PPO

## 2023-03-06 ENCOUNTER — Ambulatory Visit: Payer: Medicare PPO | Attending: Cardiovascular Disease

## 2023-03-06 DIAGNOSIS — I824Y2 Acute embolism and thrombosis of unspecified deep veins of left proximal lower extremity: Secondary | ICD-10-CM | POA: Diagnosis not present

## 2023-03-06 DIAGNOSIS — Z7901 Long term (current) use of anticoagulants: Secondary | ICD-10-CM | POA: Diagnosis not present

## 2023-03-06 DIAGNOSIS — I480 Paroxysmal atrial fibrillation: Secondary | ICD-10-CM | POA: Diagnosis not present

## 2023-03-06 LAB — POCT INR: INR: 4 — AB (ref 2.0–3.0)

## 2023-03-06 NOTE — Patient Instructions (Signed)
HOLD tonight only then Continue taking warfarin 1 tablet daily. Recheck INR in 2 weeks.  [facility] [phone] or [phone]

## 2023-03-20 ENCOUNTER — Ambulatory Visit: Payer: Medicare PPO | Attending: Cardiovascular Disease | Admitting: *Deleted

## 2023-03-20 DIAGNOSIS — I824Y2 Acute embolism and thrombosis of unspecified deep veins of left proximal lower extremity: Secondary | ICD-10-CM

## 2023-03-20 DIAGNOSIS — Z7901 Long term (current) use of anticoagulants: Secondary | ICD-10-CM | POA: Diagnosis not present

## 2023-03-20 DIAGNOSIS — I480 Paroxysmal atrial fibrillation: Secondary | ICD-10-CM

## 2023-03-20 LAB — POCT INR: INR: 2.2 (ref 2.0–3.0)

## 2023-03-20 NOTE — Patient Instructions (Signed)
Description   Continue taking warfarin 1 tablet daily.   Recheck INR in 4 weeks.  [facility] [phone] or [phone]

## 2023-03-26 ENCOUNTER — Other Ambulatory Visit (HOSPITAL_COMMUNITY): Payer: Self-pay

## 2023-03-26 MED ORDER — DILTIAZEM HCL ER BEADS 240 MG PO CP24: 240.0000 mg | ORAL_CAPSULE | Freq: Every day | ORAL | 0 refills | Status: DC

## 2023-04-17 ENCOUNTER — Ambulatory Visit: Payer: Medicare PPO | Attending: Cardiovascular Disease

## 2023-04-17 DIAGNOSIS — I824Y2 Acute embolism and thrombosis of unspecified deep veins of left proximal lower extremity: Secondary | ICD-10-CM

## 2023-04-17 DIAGNOSIS — I480 Paroxysmal atrial fibrillation: Secondary | ICD-10-CM | POA: Diagnosis not present

## 2023-04-17 DIAGNOSIS — Z7901 Long term (current) use of anticoagulants: Secondary | ICD-10-CM

## 2023-04-17 LAB — POCT INR: INR: 2.3 (ref 2.0–3.0)

## 2023-04-17 MED ORDER — WARFARIN SODIUM 5 MG PO TABS: ORAL_TABLET | ORAL | 1 refills | Status: DC

## 2023-04-17 NOTE — Patient Instructions (Signed)
Continue taking warfarin 1 tablet daily.  Recheck INR in 6 weeks. Coumadin Clinic [phone] or [phone]

## 2023-04-19 ENCOUNTER — Other Ambulatory Visit (HOSPITAL_COMMUNITY): Payer: Self-pay | Admitting: Physician Assistant

## 2023-04-20 ENCOUNTER — Other Ambulatory Visit (HOSPITAL_COMMUNITY): Payer: Self-pay | Admitting: *Deleted

## 2023-04-20 MED ORDER — DILTIAZEM HCL ER BEADS 240 MG PO CP24: 240.0000 mg | ORAL_CAPSULE | Freq: Every day | ORAL | 0 refills | Status: DC

## 2023-04-26 ENCOUNTER — Ambulatory Visit (HOSPITAL_COMMUNITY)
Admission: RE | Admit: 2023-04-26 | Discharge: 2023-04-26 | Disposition: A | Discharge status: Home / Self Care | Payer: Medicare PPO | Source: Ambulatory Visit | Attending: Physician Assistant | Admitting: Physician Assistant

## 2023-04-26 ENCOUNTER — Encounter (HOSPITAL_COMMUNITY): Payer: Self-pay | Admitting: Physician Assistant

## 2023-04-26 VITALS — BP 118/70 | HR 96 | Ht 61.0 in | Wt 210.8 lb

## 2023-04-26 DIAGNOSIS — Z79899 Other long term (current) drug therapy: Secondary | ICD-10-CM | POA: Insufficient documentation

## 2023-04-26 DIAGNOSIS — Z86718 Personal history of other venous thrombosis and embolism: Secondary | ICD-10-CM | POA: Diagnosis not present

## 2023-04-26 DIAGNOSIS — I4821 Permanent atrial fibrillation: Secondary | ICD-10-CM | POA: Insufficient documentation

## 2023-04-26 DIAGNOSIS — Z7901 Long term (current) use of anticoagulants: Secondary | ICD-10-CM | POA: Insufficient documentation

## 2023-04-26 MED ORDER — METOPROLOL SUCCINATE ER 25 MG PO TB24: 12.5000 mg | ORAL_TABLET | Freq: Every day | ORAL | 3 refills | Status: DC

## 2023-04-26 MED ORDER — DILTIAZEM HCL ER BEADS 240 MG PO CP24: 240.0000 mg | ORAL_CAPSULE | Freq: Every day | ORAL | 3 refills | Status: DC

## 2023-04-26 NOTE — Addendum Note (Signed)
Encounter addended by: Shona Simpson, RN on: 04/26/2023 9:40 AM  Actions taken: Pharmacy for encounter modified, Order list changed

## 2023-04-26 NOTE — Progress Notes (Signed)
Primary Care Physician: Angela Quint, MD Referring Physician: Dr. [name] is a 67 y.o. female with a h/o persistent afib s/p ablation x2  that failed Tikosyn back in May, after being seen by Dr. [name] MAy 2020  and was started on amiodarone 200 mg bid to f/u in the afib clinic in  3 weeks. Pt failed to come to clinic and had to refuse her refills for her to agree to come in today. She has been taking 200 mg bid of amiodarone since May.  She last had a successful cardioversion early May.  Ekg today shows afib at 112 bpm, however, she feels that she is not in afib all the time as she has some days she reports HR's in the 60-70's range. She states that she feels well. She will lower amiodarone to one tab, 200 mg a day. She feels that she has not been taking her metoprolol as she felt her HR was in control. No missed xarelto for at least 3 weeks.  Pt is back in afib clinic, 11/18 after wearing a zio patch was found  to being out of rhythm  51% while wearing the monitor, However, she is in SR today. She is on amiodarone 200 mg daily for several weeks. Her Alk phos and TSH were elevated on last visit and will repeat  labs today .    F/u in afib clinic 06/11/19. She is in SR but has developed a long  first degree AV block and also has a known RBBB. Continues on 200 mg amiodarone daily as well as 12.5 mg metoprolol succinate bid.   Discussed with Dr. [name], She has not noted any afib. He suggested to stop BB and decrease amiodarone to 100 mg daily.CHA2DS2VASc score of 2.   F/u in afib clinic, 07/21/19. She has now been off amiodarone for 4-6 weeks as it was causing EKG changes as well as elevated TSH/liver enzymes. Now in the office, she is in Sinus brady with first degree AV block no longer noted and and IRBBB. She feels well.   F/u in afib clinic, 8/10. She  has noted more palpitations. She is in SR with PAC's. Will restart low dose  BB. Continues  on xarelto 20 mg daily for a  CHA2DS2VASc score of 2.   F/u in the afib clinic, 07/21/20. She  was in a rush this am and forgot to take am BB. She  is in a sinus tach at 120 bpm. She has felt dizzy at times. No blood work x one year. She remains on xarelto with a CHA2DS2VASc score of 2.   F/u in afib clinic, 01/26/21. EKG shows afib with controlled v rate. She is not aware of being in afib. She does  not want to change afib strategy. She had been offered a repeat ablation by Dr. [name] I the past and she deferred.  She is c/o of painful L calf and LLE. She had been compliant with eliquis but had 2 8 hour trips to [place] 2/2 to family members dying in the last few weeks. She has noted a change in her leg  x 2 weeks.   F/u in afib clinic, 05/24/21. She is rate controlled permanent afib. This does not seem to cause her symptoms. Last time I saw her, she had a swollen Left leg. An U/S showed DVT and she was sent to the ER and was admitted. She was started on coumadin. She was suppose to  f/u with hematology which she failed to do , was unaware to the F/u and vascular was to f/u if ongoing issues. She went back to the ER for Left leg pain and was told she had a new clot. Per report " Nonocclusive deep vein thrombosis within the left popliteal vein." She was referred back to coumadin clinic but not to hematology or vascular. Her INR that day was at 1.5, the day before she was 3.2 and was told to reduce her coumadin the night before.  Her INR today is 2.3. Her leg is not painful to her today,  but still stays swollen mor so than RLE. She does wear support hose at times, but not daily.   F/u in afib clinic, 11/30/21. She remains in rate controlled afib, pt states not symptomatic with it. She remains on coumadin for the left  leg DVT. She has been evaluated by hematology and vascular. She states that her INR levels are hard to regulate. Follows with [facility] cou[facility] clinic.   Follow up in the AF clinic 04/26/23. Patient remains in rate  controlled afib. No bleeding issues on anticoagulation. She has been more fatigued recently but also had an URI.  Today, she denies symptoms of palpitations, chest pain, shortness of breath, orthopnea, PND, lower extremity edema, dizziness, presyncope, syncope, or neurologic sequela. The patient is tolerating medications without difficulties and is otherwise without complaint today.   Past Medical History:  Diagnosis Date   Anxiety    Asthma    Atrial fibrillation (HCC) 11/11/2010   paroxysmal   Bradycardia    Depression    DJD (degenerative joint disease)    Cervical spine   DVT (deep venous thrombosis) (HCC)    Fibromyalgia    s/p rheumatology consultation/Angela Cummings   Mild sleep apnea    Obesity    Osteoarthritis    Shoulders, hips,knees    Current Outpatient Medications  Medication Sig Dispense Refill   acetaminophen (TYLENOL) 500 MG tablet Take 1,000 mg by mouth every 6 (six) hours as needed for moderate pain or headache.     diltiazem (TIADYLT ER) 240 MG 24 hr capsule Take 1 capsule (240 mg total) by mouth daily. 30 capsule 0   meloxicam (MOBIC) 15 MG tablet Take 1 tablet (15 mg total) by mouth daily. 30 tablet 5   metoprolol succinate (TOPROL XL) 25 MG 24 hr tablet Take 0.5 tablets (12.5 mg total) by mouth daily. 30 tablet 6   Multiple Vitamins-Minerals (CENTRUM WOMEN PO) Take 1 tablet by mouth daily.     pantoprazole (PROTONIX) 40 MG tablet TAKE 1 TABLET BY MOUTH TWICE A DAY (Patient taking differently: Take 40 mg by mouth 2 (two) times daily.) 180 tablet 0   potassium chloride SA (KLOR-CON M20) 20 MEQ tablet Take 2 tablets (40 mEq total) by mouth 2 (two) times daily. 360 tablet 2   PROVENTIL HFA 108 (90 Base) MCG/ACT inhaler INHALE 2 PUFFS INTO THE LUNGS EVERY 6 HOURS AS NEEDED (Patient taking differently: Inhale 2 puffs into the lungs every 6 (six) hours as needed for shortness of breath or wheezing.) 6.7 g 0   warfarin (COUMADIN) 5 MG tablet TAKE 1 TABLET BY MOUTH DAILY OR  AS DIRECTED BY ANTICOAGULATION CLINIC. 90 tablet 1   No current facility-administered medications for this encounter.    ROS- All systems are reviewed and negative except as per the HPI above  Physical Exam: Vitals:   04/26/23 0906  BP: 118/70  Pulse: 96  Weight: 95.6  kg  Height: 5\' 1"  (1.549 m)     Wt Readings from Last 3 Encounters:  04/26/23 95.6 kg  06/08/22 92.7 kg  11/30/21 94.2 kg    GEN: Well nourished, well developed in no acute distress NECK: No JVD; No carotid bruits CARDIAC: Irregularly irregular rate and rhythm, no murmurs, rubs, gallops RESPIRATORY:  Clear to auscultation without rales, wheezing or rhonchi  ABDOMEN: Soft, non-tender, non-distended EXTREMITIES:  No edema; No deformity    EKG today demonstrates Afib, inc RBBB Vent. rate 96 BPM PR interval * ms QRS duration 106 ms QT/QTcB 368/464 ms   Epic records reviewed  CHA2DS2-VASc Score = 2  The patient's score is based upon: CHF History: 0 HTN History: 0 Diabetes History: 0 Stroke History: 0 Vascular Disease History: 0 Age Score: 1 Gender Score: 1       ASSESSMENT AND PLAN: Permanent Atrial Fibrillation (ICD10:  I48.11) The patient's CHA2DS2-VASc score is 2, indicating a 2.2% annual risk of stroke.   Patient rate controlled and asymptomatic S/p afib ablation x 2 (most recently 2015) Previously failed dofetilide and amiodarone  Continue Toprol 12.5 mg daily   Continue diltiazem 240 mg daily Took xarelto prior to DVT in August of 2022, considered a failure of Xarelto and she was changed to warfarin.    Follow up in the AF clinic in one year.     Angela Loa PA-C Afib Clinic [facility] [address] [place], [place] [address] [phone]

## 2023-05-29 ENCOUNTER — Ambulatory Visit: Payer: Medicare PPO | Attending: Cardiology

## 2023-05-29 DIAGNOSIS — I824Y2 Acute embolism and thrombosis of unspecified deep veins of left proximal lower extremity: Secondary | ICD-10-CM

## 2023-05-29 DIAGNOSIS — Z7901 Long term (current) use of anticoagulants: Secondary | ICD-10-CM | POA: Diagnosis not present

## 2023-05-29 DIAGNOSIS — I480 Paroxysmal atrial fibrillation: Secondary | ICD-10-CM

## 2023-05-29 LAB — POCT INR: INR: 1.8 — AB (ref 2.0–3.0)

## 2023-05-29 NOTE — Patient Instructions (Signed)
Take 1.5 tablets today only then Continue taking warfarin 1 tablet daily. Recheck INR in 4 weeks.  [facility] Clinic [phone] or [phone]

## 2023-06-26 ENCOUNTER — Ambulatory Visit: Payer: Medicare PPO | Attending: Cardiology

## 2023-06-26 DIAGNOSIS — Z7901 Long term (current) use of anticoagulants: Secondary | ICD-10-CM | POA: Diagnosis not present

## 2023-06-26 DIAGNOSIS — I824Y2 Acute embolism and thrombosis of unspecified deep veins of left proximal lower extremity: Secondary | ICD-10-CM

## 2023-06-26 DIAGNOSIS — I480 Paroxysmal atrial fibrillation: Secondary | ICD-10-CM | POA: Diagnosis not present

## 2023-06-26 LAB — POCT INR: INR: 1.3 — AB (ref 2.0–3.0)

## 2023-06-26 NOTE — Patient Instructions (Signed)
 Take 2 tablets today only and 1.5 tablets Wednesday then Continue taking warfarin 1 tablet daily. Recheck INR in 2 weeks.  [facility] [phone] or [phone]

## 2023-07-10 ENCOUNTER — Ambulatory Visit: Payer: Medicare PPO | Attending: Cardiology | Admitting: *Deleted

## 2023-07-10 DIAGNOSIS — I824Y2 Acute embolism and thrombosis of unspecified deep veins of left proximal lower extremity: Secondary | ICD-10-CM

## 2023-07-10 DIAGNOSIS — I480 Paroxysmal atrial fibrillation: Secondary | ICD-10-CM | POA: Diagnosis not present

## 2023-07-10 DIAGNOSIS — Z7901 Long term (current) use of anticoagulants: Secondary | ICD-10-CM

## 2023-07-10 LAB — POCT INR: INR: 2.5 (ref 2.0–3.0)

## 2023-07-10 NOTE — Patient Instructions (Signed)
Description   Continue taking warfarin 1 tablet daily.   Recheck INR in 4 weeks.  [facility] Clinic [phone] or [phone]

## 2023-08-07 ENCOUNTER — Ambulatory Visit: Payer: Self-pay

## 2023-10-21 ENCOUNTER — Other Ambulatory Visit: Payer: Self-pay | Admitting: Physician Assistant

## 2023-10-21 DIAGNOSIS — I480 Paroxysmal atrial fibrillation: Secondary | ICD-10-CM

## 2023-10-28 ENCOUNTER — Other Ambulatory Visit: Payer: Self-pay | Admitting: Physician Assistant

## 2023-10-28 DIAGNOSIS — I480 Paroxysmal atrial fibrillation: Secondary | ICD-10-CM

## 2023-12-18 ENCOUNTER — Telehealth: Payer: Self-pay

## 2023-12-18 NOTE — Telephone Encounter (Signed)
 Copied from CRM [id number]. Topic: General - Other >> Dec 18, 2023 11:49 AM Suzen RAMAN wrote: Reason for CRM: Tessi from [facility]- Chronic Care Management Verification dept would like a call back to confirm patient diagnosis [phone]

## 2023-12-19 NOTE — Telephone Encounter (Signed)
 Spoke with rep and dx has been confirmed 12/18/2023

## 2024-04-25 ENCOUNTER — Ambulatory Visit (HOSPITAL_COMMUNITY)
Admission: RE | Admit: 2024-04-25 | Discharge: 2024-04-25 | Disposition: A | Discharge status: Home / Self Care | Source: Ambulatory Visit | Attending: Physician Assistant | Admitting: Physician Assistant

## 2024-04-25 ENCOUNTER — Ambulatory Visit

## 2024-04-25 VITALS — BP 142/66 | HR 61 | Ht 61.0 in | Wt 209.6 lb

## 2024-04-25 DIAGNOSIS — I4811 Longstanding persistent atrial fibrillation: Secondary | ICD-10-CM

## 2024-04-25 DIAGNOSIS — I4891 Unspecified atrial fibrillation: Secondary | ICD-10-CM

## 2024-04-25 NOTE — Progress Notes (Signed)
 Primary Care Physician: Purcell Emil Schanz, MD Referring Physician: Dr. Kelsie Veva DELENA Angela Cummings is a 68 y.o. female with a h/o persistent afib s/p ablation x2  that failed Tikosyn  back in May, after being seen by Dr. Kelsie MAy 2020  and was started on amiodarone  200 mg bid to f/u in the afib clinic in  3 weeks. Pt failed to come to clinic and had to refuse her refills for her to agree to come in today. She has been taking 200 mg bid of amiodarone  since May.  She last had a successful cardioversion early May.  Ekg today shows afib at 112 bpm, however, she feels that she is not in afib all the time as she has some days she reports HR's in the 60-70's range. She states that she feels well. She will lower amiodarone  to one tab, 200 mg a day. She feels that she has not been taking her metoprolol  as she felt her HR was in control. No missed xarelto  for at least 3 weeks.  Pt is back in afib clinic, 11/18 after wearing a zio patch was found  to being out of rhythm  51% while wearing the monitor, However, she is in SR today. She is on amiodarone  200 mg daily for several weeks. Her Alk phos and TSH were elevated on last visit and will repeat  labs today .    F/u in afib clinic 06/11/19. She is in SR but has developed a long  first degree AV block and also has a known RBBB. Continues on 200 mg amiodarone  daily as well as 12.5 mg metoprolol  succinate bid.   Discussed with Dr. Kelsie, She has not noted any afib. He suggested to stop BB and decrease amiodarone  to 100 mg daily.CHA2DS2VASc score of 2.   F/u in afib clinic, 07/21/19. She has now been off amiodarone  for 4-6 weeks as it was causing EKG changes as well as elevated TSH/liver enzymes. Now in the office, she is in Sinus brady with first degree AV block no longer noted and and IRBBB. She feels well.   F/u in afib clinic, 8/10. She  has noted more palpitations. She is in SR with PAC's. Will restart low dose  BB. Continues  on xarelto  20 mg daily for a  CHA2DS2VASc score of 2.   F/u in the afib clinic, 07/21/20. She  was in a rush this am and forgot to take am BB. She  is in a sinus tach at 120 bpm. She has felt dizzy at times. No blood work x one year. She remains on xarelto  with a CHA2DS2VASc score of 2.   F/u in afib clinic, 01/26/21. EKG shows afib with controlled v rate. She is not aware of being in afib. She does  not want to change afib strategy. She had been offered a repeat ablation by Dr. Kelsie I the past and she deferred.  She is c/o of painful L calf and LLE. She had been compliant with eliquis but had 2 8 hour trips to [place]  2/2 to family members dying in the last few weeks. She has noted a change in her leg  x 2 weeks.   F/u in afib clinic, 05/24/21. She is rate controlled permanent afib. This does not seem to cause her symptoms. Last time I saw her, she had a swollen Left leg. An U/S showed DVT and she was sent to the ER and was admitted. She was started on coumadin . She was suppose to  f/u with hematology which she failed to do , was unaware to the F/u and vascular was to f/u if ongoing issues. She went back to the ER for Left leg pain and was told she had a new clot. Per report  Nonocclusive deep vein thrombosis within the left popliteal vein. She was referred back to coumadin  clinic but not to hematology or vascular. Her INR that day was at 1.5, the day before she was 3.2 and was told to reduce her coumadin  the night before.  Her INR today is 2.3. Her leg is not painful to her today,  but still stays swollen mor so than RLE. She does wear support hose at times, but not daily.   F/u in afib clinic, 11/30/21. She remains in rate controlled afib, pt states not symptomatic with it. She remains on coumadin  for the left  leg DVT. She has been evaluated by hematology and vascular. She states that her INR levels are hard to regulate. Follows with [facility].  Follow up in the AF clinic 04/26/23. Patient remains in rate  controlled afib. No bleeding issues on anticoagulation. She has been more fatigued recently but also had an URI.  Follow up 04/25/24. Patient returns for follow up for atrial fibrillation. Suprisingly, she is in SR today. She does not feel much different in SR. No bleeding issues on warfarin.   Today, she  denies symptoms of palpitations, chest pain, shortness of breath, orthopnea, PND, lower extremity edema, dizziness, presyncope, syncope, snoring, daytime somnolence, bleeding, or neurologic sequela. The patient is tolerating medications without difficulties and is otherwise without complaint today.    Past Medical History:  Diagnosis Date   Anxiety    Asthma    Atrial fibrillation (HCC) 11/11/2010   paroxysmal   Bradycardia    Depression    DJD (degenerative joint disease)    Cervical spine   DVT (deep venous thrombosis) (HCC)    Fibromyalgia    s/p rheumatology consultation/[facility]   Mild sleep apnea    Obesity    Osteoarthritis    Shoulders, hips,knees    Current Outpatient Medications  Medication Sig Dispense Refill   acetaminophen  (TYLENOL ) 500 MG tablet Take 1,000 mg by mouth every 6 (six) hours as needed for moderate pain or headache. (Patient taking differently: Take 1,000 mg by mouth as needed for moderate pain (pain score 4-6) or headache.)     buPROPion (WELLBUTRIN XL) 150 MG 24 hr tablet Take 150 mg by mouth daily.     diclofenac  (VOLTAREN ) 75 MG EC tablet Take 75 mg by mouth every morning.     diltiazem  (TIADYLT  ER) 240 MG 24 hr capsule Take 1 capsule (240 mg total) by mouth daily. 90 capsule 3   ferrous gluconate (FERGON) 240 (27 FE) MG tablet Take 240 mg by mouth every morning.     hydrOXYzine (ATARAX) 25 MG tablet Take 25 mg by mouth every 8 (eight) hours as needed. for anxiety     meloxicam  (MOBIC ) 15 MG tablet Take 1 tablet (15 mg total) by mouth daily. 30 tablet 5   metoprolol  succinate (TOPROL  XL) 25 MG 24 hr tablet Take 0.5 tablets (12.5 mg total) by mouth  daily. 45 tablet 3   Multiple Vitamins-Minerals (CENTRUM WOMEN PO) Take 1 tablet by mouth daily.     pantoprazole  (PROTONIX ) 40 MG tablet TAKE 1 TABLET BY MOUTH TWICE A DAY 180 tablet 0   potassium chloride  SA (KLOR-CON  M20) 20 MEQ tablet Take 2 tablets (40  mEq total) by mouth 2 (two) times daily. 360 tablet 2   PROVENTIL  HFA 108 (90 Base) MCG/ACT inhaler INHALE 2 PUFFS INTO THE LUNGS EVERY 6 HOURS AS NEEDED 6.7 g 0   warfarin (COUMADIN ) 5 MG tablet NEEDS COUMADIN  APPT FOR REFILLS, CALL OFFICE. TAKE 1 TABLET BY MOUTH DAILY OR AS DIRECTED BY ANTICOAGULATION CLINIC. 10 tablet 0   No current facility-administered medications for this encounter.    ROS- All systems are reviewed and negative except as per the HPI above  Physical Exam: Vitals:   04/25/24 1032  Weight: 95.1 kg  Height: 5' 1 (1.549 m)     Wt Readings from Last 3 Encounters:  04/25/24 95.1 kg  04/26/23 95.6 kg  06/08/22 92.7 kg    GEN: Well nourished, well developed in no acute distress CARDIAC: Regular rate and rhythm with occasional ectopy, no murmurs, rubs, gallops RESPIRATORY:  Clear to auscultation without rales, wheezing or rhonchi  ABDOMEN: Soft, non-tender, non-distended EXTREMITIES:  No edema; No deformity    EKG today demonstrates SR, PACs Vent. rate 61 BPM PR interval 200 ms QRS duration 92 ms QT/QTcB 442/444 ms   Epic records reviewed   CHA2DS2-VASc Score = 2  The patient's score is based upon: CHF History: 0 HTN History: 0 Diabetes History: 0 Stroke History: 0 Vascular Disease History: 0 Age Score: 1 Gender Score: 1       ASSESSMENT AND PLAN: Longstanding Persistent Atrial Fibrillation (ICD10:  I48.11) The patient's CHA2DS2-VASc score is 2, indicating a 2.2% annual risk of stroke.   Patient in SR today, last ECG in SR was 2022, thought to be permanent.  S/p afib ablation x 2 in 2014 and 2015. Previously failed propafenone , dofetilide  and amiodarone .  We discussed rate vs rhythm  control today. Since she spontaneously converted, she would be interested in PFA to help maintain SR, will refer to establish care with EP.  Continue Toprol  12.5 mg daily Continue diltiazem  240 mg daily Was on Xarelto  previously but had DVT in 2022, switched to warfarin.    Follow up with EP to establish care and discuss potential repeat ablation.    Daril Kicks PA-C Afib Clinic [facility] [address] [place], [place] [address] [phone]

## 2024-04-29 ENCOUNTER — Other Ambulatory Visit (HOSPITAL_COMMUNITY): Payer: Self-pay | Admitting: Physician Assistant

## 2024-05-06 ENCOUNTER — Ambulatory Visit: Admitting: *Deleted

## 2024-05-06 DIAGNOSIS — I824Y2 Acute embolism and thrombosis of unspecified deep veins of left proximal lower extremity: Secondary | ICD-10-CM

## 2024-05-06 DIAGNOSIS — Z5181 Encounter for therapeutic drug level monitoring: Secondary | ICD-10-CM | POA: Diagnosis not present

## 2024-05-06 DIAGNOSIS — I480 Paroxysmal atrial fibrillation: Secondary | ICD-10-CM | POA: Diagnosis not present

## 2024-05-06 LAB — POCT INR: POC INR: 1.1

## 2024-05-06 NOTE — Progress Notes (Signed)
 Lab Results  Component Value Date   INR 1.1 05/06/2024   INR 2.5 07/10/2023   INR 1.3 (A) 06/26/2023    Description   Take 1.5 tablets of warfarin today and tomorrow then continue taking warfarin 1 tablet daily. Recheck INR in 2 weeks.  Coumadin  Clinic [phone] or [phone]

## 2024-05-06 NOTE — Patient Instructions (Signed)
 Description   Take 1.5 tablets of warfarin today and tomorrow then continue taking warfarin 1 tablet daily. Recheck INR in 2 weeks.  Coumadin  Clinic [phone] or [phone]

## 2024-05-22 ENCOUNTER — Ambulatory Visit: Attending: Cardiovascular Disease | Admitting: *Deleted

## 2024-05-22 DIAGNOSIS — I824Y2 Acute embolism and thrombosis of unspecified deep veins of left proximal lower extremity: Secondary | ICD-10-CM | POA: Diagnosis not present

## 2024-05-22 DIAGNOSIS — I480 Paroxysmal atrial fibrillation: Secondary | ICD-10-CM | POA: Diagnosis not present

## 2024-05-22 DIAGNOSIS — Z5181 Encounter for therapeutic drug level monitoring: Secondary | ICD-10-CM | POA: Diagnosis not present

## 2024-05-22 LAB — POCT INR: POC INR: 1.5

## 2024-05-22 NOTE — Patient Instructions (Signed)
 Description   INR 1.5; Take warfarin 7.5mg  today and then START taking warfarin 5 mg daily except for 7.5mg  on Mondays and Fridays. Recheck INR in 2 weeks.  Coumadin  Clinic [phone] or [phone]

## 2024-05-22 NOTE — Progress Notes (Signed)
 Lab Results  Component Value Date   INR 1.5 05/22/2024   INR 1.1 05/06/2024   INR 2.5 07/10/2023    Description   INR 1.5; Take warfarin 7.5mg  today and then START taking warfarin 5 mg daily except for 7.5mg  on Mondays and Fridays. Recheck INR in 2 weeks.  Coumadin  Clinic [phone] or [phone]

## 2024-06-11 ENCOUNTER — Ambulatory Visit

## 2024-06-18 NOTE — Progress Notes (Signed)
 " Electrophysiology Office Note:    Date:  06/19/2024   ID:  Angela Cummings, DOB 1955-12-11, MRN [id number]  PCP:  Purcell Emil Schanz, MD   [facility] Providers Cardiologist:  None     Referring MD: Nellene Quita SAUNDERS, PA   History of Present Illness:    Angela Cummings is a 69 y.o. female with a medical history significant for persistent atrial fibrillation s/p 2 ablations, referred for months arrhythmia management.      History of Present Illness She has a history of atrial fibrillation and has had 2 ablations by Dr. Kelsie.  She was maintained on amiodarone .  She was doing well, so the her dose was decreased to 100 mg daily.  Amiodarone  was eventually discontinued due to elevated TSH and liver enzymes as well as EKG changes.  Off amiodarone , her first-degree AV block improved and her intraventricular conduction delay improved.  When seen in clinic in August 2022 she was in atrial fibrillation with controlled ventricular rates.  She was not aware of being in atrial fibrillation at the time and did not want to change her A-fib strategy despite being offered to repeat ablation by Dr. Kelsie.  Despite being in atrial fibrillation persistently from 2022 until late 2024, she was in normal sinus rhythm when seen in atrial fibrillation clinic in November 2025.  She reports she did not feel much different than she had in the past.         Today, she reports she feels at baseline.  No palpitations.  She has persistent fatigue.  EKGs/Labs/Other Studies Reviewed Today:     Echocardiogram:  TTE September 2022 LVEF 50 to 55%.  Mild LVH.  Severely dilated left atrium.  Mild mitral valve regurgitation.     EKG:   EKG Interpretation Date/Time:  Thursday June 19 2024 10:57:54 EST Ventricular Rate:  79 PR Interval:    QRS Duration:  116 QT Interval:  402 QTC Calculation: 460 R Axis:   53  Text Interpretation: Atrial fibrillation with a competing junctional pacemaker  Low voltage QRS Incomplete right bundle branch block When compared with ECG of 25-Apr-2024 10:42, Atrial fibrillation has replaced Sinus rhythm Borderline criteria for Inferior infarct are no longer Present Nonspecific T wave abnormality has replaced inverted T waves in Inferior leads Confirmed by Nancey Scotts [phone]) on 06/19/2024 11:24:39 AM     Physical Exam:    VS:  BP 138/79 (BP Location: Left Arm, Patient Position: Sitting, Cuff Size: Large)   Pulse 78   Resp 17   Ht 5' 1 (1.549 m)   Wt 211 lb (95.7 kg)   SpO2 98%   BMI 39.87 kg/m     Wt Readings from Last 3 Encounters:  06/19/24 211 lb (95.7 kg)  04/25/24 209 lb 9.6 oz (95.1 kg)  04/26/23 210 lb 12.8 oz (95.6 kg)     GEN: Well nourished, well developed in no acute distress CARDIAC: RRR, no murmurs, rubs, gallops RESPIRATORY:  Normal work of breathing MUSCULOSKELETAL: no edema    ASSESSMENT & PLAN:     Longstanding persistent atrial fibrillation Status post ablation x 2, most recently 2015. Has previously failed propafenone , dofetilide , and amiodarone  She is not aware of whether she is in sinus rhythm or atrial fibrillation Morbidly obese, BMI 40 Will order TTE to assess atrial dimensions I think the likelihood of durable maintenance of sinus rhythm is very low I do not think she is a good candidate for repeat ablation unless she  has relatively normal left atrial size  Secondary hypercoagulable state CHA2DS2-VASc score is 2 She is on warfarin   Signed, Eulas FORBES Furbish, MD  06/19/2024 11:31 AM    [facility] "

## 2024-06-19 ENCOUNTER — Ambulatory Visit: Attending: Cardiovascular Disease | Admitting: Cardiovascular Disease

## 2024-06-19 ENCOUNTER — Encounter: Payer: Self-pay | Admitting: Cardiovascular Disease

## 2024-06-19 ENCOUNTER — Ambulatory Visit (INDEPENDENT_AMBULATORY_CARE_PROVIDER_SITE_OTHER): Admitting: Pharmacist

## 2024-06-19 VITALS — BP 138/79 | HR 78 | Resp 17 | Ht 61.0 in | Wt 211.0 lb

## 2024-06-19 DIAGNOSIS — I480 Paroxysmal atrial fibrillation: Secondary | ICD-10-CM | POA: Diagnosis not present

## 2024-06-19 DIAGNOSIS — Z7901 Long term (current) use of anticoagulants: Secondary | ICD-10-CM

## 2024-06-19 DIAGNOSIS — I824Y2 Acute embolism and thrombosis of unspecified deep veins of left proximal lower extremity: Secondary | ICD-10-CM | POA: Diagnosis not present

## 2024-06-19 LAB — POCT INR: INR: 2.4 (ref 2.0–3.0)

## 2024-06-19 MED ORDER — DILTIAZEM HCL ER BEADS 240 MG PO CP24: 240.0000 mg | ORAL_CAPSULE | Freq: Every day | ORAL | 1 refills | Status: AC

## 2024-06-19 NOTE — Patient Instructions (Addendum)
 Description   INR 2.4; Continue taking warfarin 5 mg daily except for 7.5mg  on Mondays and Fridays. Recheck INR in 4 weeks.  Coumadin  Clinic [phone]

## 2024-06-19 NOTE — Progress Notes (Signed)
 Description   INR 2.4; Continue taking warfarin 5 mg daily except for 7.5mg  on Mondays and Fridays. Recheck INR in 4 weeks.  Coumadin  Clinic [phone]

## 2024-06-19 NOTE — Addendum Note (Signed)
 Addended by: CASIMIR ALDONA BRAVO on: 06/19/2024 11:43 AM   Modules accepted: Orders

## 2024-06-19 NOTE — Addendum Note (Signed)
 Addended by: CASIMIR ALDONA BRAVO on: 06/19/2024 11:40 AM   Modules accepted: Orders

## 2024-06-19 NOTE — Patient Instructions (Signed)
 Medication Instructions:  Your physician recommends that you continue on your current medications as directed. Please refer to the Current Medication list given to you today.  *If you need a refill on your cardiac medications before your next appointment, please call your pharmacy*  Lab Work: None ordered.  If you have labs (blood work) drawn today and your tests are completely normal, you will receive your results only by: MyChart Message (if you have MyChart) OR A paper copy in the mail If you have any lab test that is abnormal or we need to change your treatment, we will call you to review the results.  Testing/Procedures: Your physician has requested that you have an echocardiogram. Echocardiography is a painless test that uses sound waves to create images of your heart. It provides your doctor with information about the size and shape of your heart and how well your hearts chambers and valves are working. This procedure takes approximately one hour. There are no restrictions for this procedure. Please do NOT wear cologne, perfume, aftershave, or lotions (deodorant is allowed). Please arrive 15 minutes prior to your appointment time.  Please note: We ask at that you not bring children with you during ultrasound (echo/ vascular) testing. Due to room size and safety concerns, children are not allowed in the ultrasound rooms during exams. Our front office staff cannot provide observation of children in our lobby area while testing is being conducted. An adult accompanying a patient to their appointment will only be allowed in the ultrasound room at the discretion of the ultrasound technician under special circumstances. We apologize for any inconvenience.   Follow-Up: At [facility], you and your health needs are our priority.  As part of our continuing mission to provide you with exceptional heart care, our providers are all part of one team.  This team includes your primary  Cardiologist (physician) and Advanced Practice Providers or APPs (Physician Assistants and Nurse Practitioners) who all work together to provide you with the care you need, when you need it.  Your next appointment:   6 months with Afib Clinic

## 2024-07-04 NOTE — Telephone Encounter (Signed)
 Error

## 2024-07-17 ENCOUNTER — Ambulatory Visit

## 2024-07-22 ENCOUNTER — Ambulatory Visit (HOSPITAL_COMMUNITY)

## 2024-12-17 ENCOUNTER — Ambulatory Visit (HOSPITAL_COMMUNITY): Admitting: Physician Assistant
